# Patient Record
Sex: Male | Born: 1965
Health system: Southern US, Community
[De-identification: ages and names within clinical notes are randomized; demographics above are authoritative.]

## PROBLEM LIST (undated history)

## (undated) DIAGNOSIS — E785 Hyperlipidemia, unspecified: Secondary | ICD-10-CM

## (undated) DIAGNOSIS — M1990 Unspecified osteoarthritis, unspecified site: Secondary | ICD-10-CM

## (undated) HISTORY — DX: Hyperlipidemia, unspecified: E78.5

## (undated) HISTORY — DX: Unspecified osteoarthritis, unspecified site: M19.90

## (undated) HISTORY — PX: TRANSMETATARSAL AMPUTATION: SHX6197

---

## 2003-12-30 ENCOUNTER — Emergency Department (HOSPITAL_COMMUNITY): Admission: EM | Admit: 2003-12-30 | Discharge: 2003-12-30 | Payer: Self-pay | Admitting: Emergency Medicine

## 2004-01-16 ENCOUNTER — Emergency Department (HOSPITAL_COMMUNITY): Admission: EM | Admit: 2004-01-16 | Discharge: 2004-01-16 | Payer: Self-pay | Admitting: *Deleted

## 2004-01-18 ENCOUNTER — Ambulatory Visit: Payer: Self-pay | Admitting: Internal Medicine

## 2004-01-18 ENCOUNTER — Ambulatory Visit (HOSPITAL_COMMUNITY): Admission: RE | Admit: 2004-01-18 | Discharge: 2004-01-18 | Payer: Self-pay | Admitting: Internal Medicine

## 2004-02-16 ENCOUNTER — Ambulatory Visit: Payer: Self-pay | Admitting: Internal Medicine

## 2004-10-16 ENCOUNTER — Ambulatory Visit: Payer: Self-pay | Admitting: Internal Medicine

## 2004-11-16 ENCOUNTER — Ambulatory Visit: Payer: Self-pay | Admitting: Internal Medicine

## 2004-12-29 ENCOUNTER — Emergency Department (HOSPITAL_COMMUNITY): Admission: EM | Admit: 2004-12-29 | Discharge: 2004-12-30 | Payer: Self-pay | Admitting: Emergency Medicine

## 2005-04-10 ENCOUNTER — Emergency Department (HOSPITAL_COMMUNITY): Admission: EM | Admit: 2005-04-10 | Discharge: 2005-04-11 | Payer: Self-pay | Admitting: Certified Registered"

## 2005-04-16 ENCOUNTER — Encounter (INDEPENDENT_AMBULATORY_CARE_PROVIDER_SITE_OTHER): Payer: Self-pay | Admitting: Internal Medicine

## 2005-04-16 ENCOUNTER — Ambulatory Visit: Payer: Self-pay | Admitting: Hospitalist

## 2005-04-26 ENCOUNTER — Ambulatory Visit: Payer: Self-pay | Admitting: Internal Medicine

## 2005-05-07 ENCOUNTER — Ambulatory Visit: Payer: Self-pay | Admitting: Internal Medicine

## 2005-07-12 ENCOUNTER — Ambulatory Visit: Payer: Self-pay | Admitting: Internal Medicine

## 2005-07-31 ENCOUNTER — Emergency Department (HOSPITAL_COMMUNITY): Admission: EM | Admit: 2005-07-31 | Discharge: 2005-07-31 | Payer: Self-pay | Admitting: Emergency Medicine

## 2005-12-06 DIAGNOSIS — E1165 Type 2 diabetes mellitus with hyperglycemia: Secondary | ICD-10-CM | POA: Insufficient documentation

## 2005-12-06 DIAGNOSIS — E1169 Type 2 diabetes mellitus with other specified complication: Secondary | ICD-10-CM

## 2005-12-06 DIAGNOSIS — E785 Hyperlipidemia, unspecified: Secondary | ICD-10-CM

## 2005-12-14 ENCOUNTER — Emergency Department (HOSPITAL_COMMUNITY): Admission: EM | Admit: 2005-12-14 | Discharge: 2005-12-14 | Payer: Self-pay | Admitting: Emergency Medicine

## 2005-12-17 ENCOUNTER — Encounter (INDEPENDENT_AMBULATORY_CARE_PROVIDER_SITE_OTHER): Payer: Self-pay | Admitting: *Deleted

## 2005-12-17 ENCOUNTER — Ambulatory Visit: Payer: Self-pay | Admitting: Hospitalist

## 2005-12-17 LAB — CONVERTED CEMR LAB
CO2: 24 meq/L (ref 19–32)
Creatinine, Ser: 1.02 mg/dL (ref 0.40–1.50)
Glucose, Bld: 217 mg/dL — ABNORMAL HIGH (ref 70–99)
Microalb Creat Ratio: 5.8 mg/g (ref 0.0–30.0)
Microalb, Ur: 0.49 mg/dL (ref 0.00–1.89)
TSH: 0.909 microintl units/mL (ref 0.350–5.50)
Total Bilirubin: 0.3 mg/dL (ref 0.3–1.2)

## 2005-12-31 ENCOUNTER — Encounter (INDEPENDENT_AMBULATORY_CARE_PROVIDER_SITE_OTHER): Payer: Self-pay | Admitting: *Deleted

## 2005-12-31 ENCOUNTER — Ambulatory Visit: Payer: Self-pay | Admitting: Internal Medicine

## 2005-12-31 LAB — CONVERTED CEMR LAB
Cholesterol: 206 mg/dL — ABNORMAL HIGH (ref 0–200)
HDL: 45 mg/dL (ref >39)
Total CHOL/HDL Ratio: 4.6

## 2006-01-08 ENCOUNTER — Ambulatory Visit (HOSPITAL_COMMUNITY): Admission: RE | Admit: 2006-01-08 | Discharge: 2006-01-08 | Payer: Self-pay | Admitting: Internal Medicine

## 2006-01-08 ENCOUNTER — Encounter (INDEPENDENT_AMBULATORY_CARE_PROVIDER_SITE_OTHER): Payer: Self-pay | Admitting: Infectious Diseases

## 2006-01-08 ENCOUNTER — Ambulatory Visit: Payer: Self-pay | Admitting: Internal Medicine

## 2006-01-08 LAB — CONVERTED CEMR LAB
Alkaline Phosphatase: 64 units/L (ref 39–117)
BUN: 14 mg/dL (ref 6–23)
Creatinine, Ser: 1 mg/dL (ref 0.40–1.50)
Glucose, Bld: 102 mg/dL — ABNORMAL HIGH (ref 70–99)
Total Bilirubin: 0.3 mg/dL (ref 0.3–1.2)

## 2006-05-07 ENCOUNTER — Emergency Department (HOSPITAL_COMMUNITY): Admission: EM | Admit: 2006-05-07 | Discharge: 2006-05-07 | Payer: Self-pay | Admitting: Emergency Medicine

## 2006-10-04 ENCOUNTER — Ambulatory Visit: Payer: Self-pay | Admitting: Hospitalist

## 2006-10-04 ENCOUNTER — Encounter (INDEPENDENT_AMBULATORY_CARE_PROVIDER_SITE_OTHER): Payer: Self-pay | Admitting: *Deleted

## 2006-10-04 DIAGNOSIS — L210 Seborrhea capitis: Secondary | ICD-10-CM

## 2006-10-04 LAB — CONVERTED CEMR LAB
Blood Glucose, Fingerstick: 197
Hgb A1c MFr Bld: 10 %

## 2006-10-07 DIAGNOSIS — K644 Residual hemorrhoidal skin tags: Secondary | ICD-10-CM | POA: Insufficient documentation

## 2006-11-14 ENCOUNTER — Encounter (INDEPENDENT_AMBULATORY_CARE_PROVIDER_SITE_OTHER): Payer: Self-pay | Admitting: Infectious Diseases

## 2006-11-14 ENCOUNTER — Ambulatory Visit: Payer: Self-pay | Admitting: Internal Medicine

## 2006-11-14 LAB — CONVERTED CEMR LAB
AST: 16 units/L (ref 0–37)
Alkaline Phosphatase: 77 units/L (ref 39–117)
BUN: 17 mg/dL (ref 6–23)
Calcium: 9.1 mg/dL (ref 8.4–10.5)
Chloride: 103 meq/L (ref 96–112)
Creatinine, Ser: 0.91 mg/dL (ref 0.40–1.50)
Creatinine, Urine: 69.8 mg/dL
Microalb, Ur: 0.2 mg/dL (ref 0.00–1.89)

## 2006-11-19 ENCOUNTER — Telehealth: Payer: Self-pay | Admitting: *Deleted

## 2006-11-21 ENCOUNTER — Telehealth: Payer: Self-pay | Admitting: *Deleted

## 2006-11-27 ENCOUNTER — Emergency Department (HOSPITAL_COMMUNITY): Admission: EM | Admit: 2006-11-27 | Discharge: 2006-11-27 | Payer: Self-pay | Admitting: Emergency Medicine

## 2007-02-06 ENCOUNTER — Encounter (INDEPENDENT_AMBULATORY_CARE_PROVIDER_SITE_OTHER): Payer: Self-pay | Admitting: Internal Medicine

## 2007-02-06 ENCOUNTER — Ambulatory Visit: Payer: Self-pay | Admitting: Internal Medicine

## 2007-02-15 ENCOUNTER — Emergency Department (HOSPITAL_COMMUNITY): Admission: EM | Admit: 2007-02-15 | Discharge: 2007-02-15 | Payer: Self-pay | Admitting: Emergency Medicine

## 2007-03-03 ENCOUNTER — Encounter (INDEPENDENT_AMBULATORY_CARE_PROVIDER_SITE_OTHER): Payer: Self-pay | Admitting: Internal Medicine

## 2007-03-10 ENCOUNTER — Encounter (INDEPENDENT_AMBULATORY_CARE_PROVIDER_SITE_OTHER): Payer: Self-pay | Admitting: Internal Medicine

## 2007-06-16 ENCOUNTER — Emergency Department (HOSPITAL_COMMUNITY): Admission: EM | Admit: 2007-06-16 | Discharge: 2007-06-17 | Payer: Self-pay | Admitting: Emergency Medicine

## 2007-06-17 ENCOUNTER — Encounter: Payer: Self-pay | Admitting: *Deleted

## 2007-06-17 ENCOUNTER — Ambulatory Visit: Payer: Self-pay | Admitting: Internal Medicine

## 2007-06-17 ENCOUNTER — Observation Stay (HOSPITAL_COMMUNITY): Admission: AD | Admit: 2007-06-17 | Discharge: 2007-06-18 | Payer: Self-pay | Admitting: Internal Medicine

## 2007-06-19 ENCOUNTER — Telehealth (INDEPENDENT_AMBULATORY_CARE_PROVIDER_SITE_OTHER): Payer: Self-pay | Admitting: *Deleted

## 2007-06-19 ENCOUNTER — Ambulatory Visit: Payer: Self-pay | Admitting: Internal Medicine

## 2007-07-10 ENCOUNTER — Ambulatory Visit: Payer: Self-pay | Admitting: Internal Medicine

## 2007-07-10 ENCOUNTER — Encounter (INDEPENDENT_AMBULATORY_CARE_PROVIDER_SITE_OTHER): Payer: Self-pay | Admitting: Internal Medicine

## 2007-07-10 LAB — CONVERTED CEMR LAB
Blood Glucose, Fingerstick: 407
Blood Glucose, Home Monitor: 1 mg/dL

## 2007-07-15 LAB — CONVERTED CEMR LAB
AST: 16 units/L (ref 0–37)
Albumin: 4.5 g/dL (ref 3.5–5.2)
Alkaline Phosphatase: 81 units/L (ref 39–117)
BUN: 18 mg/dL (ref 6–23)
Potassium: 4.2 meq/L (ref 3.5–5.3)

## 2007-10-29 ENCOUNTER — Telehealth (INDEPENDENT_AMBULATORY_CARE_PROVIDER_SITE_OTHER): Payer: Self-pay | Admitting: Internal Medicine

## 2008-01-07 ENCOUNTER — Telehealth: Payer: Self-pay | Admitting: *Deleted

## 2008-02-15 ENCOUNTER — Emergency Department (HOSPITAL_COMMUNITY): Admission: EM | Admit: 2008-02-15 | Discharge: 2008-02-15 | Payer: Self-pay | Admitting: Emergency Medicine

## 2008-04-02 ENCOUNTER — Encounter (INDEPENDENT_AMBULATORY_CARE_PROVIDER_SITE_OTHER): Payer: Self-pay | Admitting: Internal Medicine

## 2008-04-02 ENCOUNTER — Encounter (INDEPENDENT_AMBULATORY_CARE_PROVIDER_SITE_OTHER): Payer: Self-pay | Admitting: *Deleted

## 2008-04-02 ENCOUNTER — Ambulatory Visit: Payer: Self-pay | Admitting: Internal Medicine

## 2008-04-02 DIAGNOSIS — K119 Disease of salivary gland, unspecified: Secondary | ICD-10-CM | POA: Insufficient documentation

## 2008-04-05 LAB — CONVERTED CEMR LAB
Albumin: 4 g/dL (ref 3.5–5.2)
BUN: 21 mg/dL (ref 6–23)
Band Neutrophils: 0 % (ref 0–10)
CO2: 25 meq/L (ref 19–32)
Calcium: 9 mg/dL (ref 8.4–10.5)
Chloride: 100 meq/L (ref 96–112)
Creatinine, Ser: 1.21 mg/dL (ref 0.40–1.50)
Eosinophils Absolute: 0.7 10*3/uL (ref 0.0–0.7)
Eosinophils Relative: 9 % — ABNORMAL HIGH (ref 0–5)
HCT: 39.7 % (ref 39.0–52.0)
Hemoglobin: 13.5 g/dL (ref 13.0–17.0)
Lymphs Abs: 2.1 10*3/uL (ref 0.7–4.0)
MCHC: 34 g/dL (ref 30.0–36.0)
MCV: 75.6 fL — ABNORMAL LOW (ref 78.0–100.0)
Monocytes Absolute: 0.5 10*3/uL (ref 0.1–1.0)
Monocytes Relative: 6 % (ref 3–12)
RBC: 5.25 M/uL (ref 4.22–5.81)
TSH: 0.68 microintl units/mL (ref 0.350–4.500)

## 2008-04-20 ENCOUNTER — Ambulatory Visit: Payer: Self-pay | Admitting: Internal Medicine

## 2008-04-20 LAB — CONVERTED CEMR LAB: Blood Glucose, Fingerstick: 334

## 2008-04-21 ENCOUNTER — Ambulatory Visit: Payer: Self-pay | Admitting: *Deleted

## 2008-04-21 ENCOUNTER — Encounter (INDEPENDENT_AMBULATORY_CARE_PROVIDER_SITE_OTHER): Payer: Self-pay | Admitting: Internal Medicine

## 2008-04-21 LAB — CONVERTED CEMR LAB
ALT: 22 units/L (ref 0–53)
Alkaline Phosphatase: 76 units/L (ref 39–117)
Creatinine, Ser: 0.97 mg/dL (ref 0.40–1.50)
Glucose, Bld: 223 mg/dL — ABNORMAL HIGH (ref 70–99)
LDL Cholesterol: 78 mg/dL (ref 0–99)
Sodium: 141 meq/L (ref 135–145)
Total Bilirubin: 0.4 mg/dL (ref 0.3–1.2)
Total CHOL/HDL Ratio: 4.1
Total Protein: 7.6 g/dL (ref 6.0–8.3)
Triglycerides: 146 mg/dL (ref <150)
VLDL: 29 mg/dL (ref 0–40)

## 2008-05-05 ENCOUNTER — Telehealth (INDEPENDENT_AMBULATORY_CARE_PROVIDER_SITE_OTHER): Payer: Self-pay | Admitting: Pharmacy Technician

## 2008-05-07 ENCOUNTER — Encounter (INDEPENDENT_AMBULATORY_CARE_PROVIDER_SITE_OTHER): Payer: Self-pay | Admitting: Internal Medicine

## 2008-05-24 ENCOUNTER — Ambulatory Visit: Payer: Self-pay | Admitting: Internal Medicine

## 2008-05-26 ENCOUNTER — Telehealth (INDEPENDENT_AMBULATORY_CARE_PROVIDER_SITE_OTHER): Payer: Self-pay | Admitting: *Deleted

## 2008-06-03 ENCOUNTER — Encounter (INDEPENDENT_AMBULATORY_CARE_PROVIDER_SITE_OTHER): Payer: Self-pay | Admitting: Internal Medicine

## 2008-06-08 ENCOUNTER — Encounter (INDEPENDENT_AMBULATORY_CARE_PROVIDER_SITE_OTHER): Payer: Self-pay | Admitting: Internal Medicine

## 2008-06-08 ENCOUNTER — Ambulatory Visit: Payer: Self-pay | Admitting: Internal Medicine

## 2008-09-16 ENCOUNTER — Telehealth: Payer: Self-pay | Admitting: Internal Medicine

## 2009-01-14 ENCOUNTER — Encounter: Payer: Self-pay | Admitting: Internal Medicine

## 2009-01-14 ENCOUNTER — Ambulatory Visit: Payer: Self-pay | Admitting: Infectious Diseases

## 2009-01-14 ENCOUNTER — Ambulatory Visit (HOSPITAL_COMMUNITY): Admission: RE | Admit: 2009-01-14 | Discharge: 2009-01-14 | Payer: Self-pay | Admitting: Infectious Diseases

## 2009-01-14 DIAGNOSIS — R079 Chest pain, unspecified: Secondary | ICD-10-CM

## 2009-01-14 LAB — CONVERTED CEMR LAB
Albumin: 3.7 g/dL (ref 3.5–5.2)
Alkaline Phosphatase: 89 units/L (ref 39–117)
BUN: 15 mg/dL (ref 6–23)
CO2: 29 meq/L (ref 19–32)
Calcium: 9.3 mg/dL (ref 8.4–10.5)
Glucose, Bld: 322 mg/dL — ABNORMAL HIGH (ref 70–99)
Hgb A1c MFr Bld: 13.1 %
Potassium: 4.3 meq/L (ref 3.5–5.3)
Sodium: 136 meq/L (ref 135–145)
Total Protein: 7.5 g/dL (ref 6.0–8.3)
Troponin I: 0.01 ng/mL (ref <0.06)

## 2009-01-25 ENCOUNTER — Encounter: Payer: Self-pay | Admitting: Internal Medicine

## 2009-02-01 ENCOUNTER — Ambulatory Visit: Payer: Self-pay | Admitting: Internal Medicine

## 2009-02-01 LAB — CONVERTED CEMR LAB
Blood Glucose, Fingerstick: 167
Hgb A1c MFr Bld: 11.7 %

## 2009-02-02 ENCOUNTER — Encounter (INDEPENDENT_AMBULATORY_CARE_PROVIDER_SITE_OTHER): Payer: Self-pay | Admitting: Internal Medicine

## 2009-02-18 ENCOUNTER — Ambulatory Visit: Payer: Self-pay | Admitting: Internal Medicine

## 2009-03-28 ENCOUNTER — Encounter: Payer: Self-pay | Admitting: Internal Medicine

## 2009-10-22 ENCOUNTER — Emergency Department (HOSPITAL_COMMUNITY): Admission: EM | Admit: 2009-10-22 | Discharge: 2009-10-22 | Payer: Self-pay | Admitting: Emergency Medicine

## 2010-01-02 ENCOUNTER — Emergency Department (HOSPITAL_COMMUNITY)
Admission: EM | Admit: 2010-01-02 | Discharge: 2010-01-03 | Payer: Self-pay | Source: Home / Self Care | Admitting: Emergency Medicine

## 2010-01-02 ENCOUNTER — Emergency Department (HOSPITAL_COMMUNITY)
Admission: EM | Admit: 2010-01-02 | Discharge: 2010-01-02 | Payer: Self-pay | Source: Home / Self Care | Admitting: Family Medicine

## 2010-01-09 ENCOUNTER — Telehealth: Payer: Self-pay | Admitting: Internal Medicine

## 2010-01-09 ENCOUNTER — Emergency Department (HOSPITAL_COMMUNITY)
Admission: EM | Admit: 2010-01-09 | Discharge: 2010-01-09 | Payer: Self-pay | Source: Home / Self Care | Admitting: Emergency Medicine

## 2010-01-18 ENCOUNTER — Ambulatory Visit: Payer: Self-pay

## 2010-02-28 NOTE — Consult Note (Signed)
Summary: [facility] 2010: Diabetic Eye Exam  [facility] 2010: Diabetic Eye Exam   Imported By: Florinda Marker 04/25/2009 11:20:41  _____________________________________________________________________  External Attachment:    Type:   Image     Comment:   External Document  Appended Document: [facility] 2010: Diabetic Eye Exam    Clinical Lists Changes  Observations: Added new observation of DIAB EYE EX: No diabetic retinopathy (04/25/2009 14:57)

## 2010-02-28 NOTE — Letter (Signed)
Summary: Meter DownLoad  Meter DownLoad   Imported By: Florinda Marker 02/02/2009 16:30:19  _____________________________________________________________________  External Attachment:    Type:   Image     Comment:   External Document

## 2010-02-28 NOTE — Assessment & Plan Note (Signed)
Summary: 2WK F/U/SHAH/VS   Vital Signs:  Patient profile:   45 year old male Height:      73 inches Weight:      237.5 pounds BMI:     31.45 Temp:     98.2 degrees F oral Pulse rate:   70 / minute BP sitting:   129 / 83  Vitals Entered By: Filomena Jungling NT II (February 18, 2009 11:05 AM) CC: 2 week follow-up for sugar Is Patient Diabetic? Yes Did you bring your meter with you today? Yes Pain Assessment Patient in pain? no      Nutritional Status BMI of > 30 = obese CBG Result 147  Have you ever been in a relationship where you felt threatened, hurt or afraid?No   Does patient need assistance? Functional Status Self care Ambulation Normal   Primary Care Provider:  Clerance Lav MD  CC:  2 week follow-up for sugar.  History of Present Illness: This is a 45 year old man with PMH of DM, HLD who came here for recheck his CBG after last visit about  2 week ago.  He can speak and understand limited [place] and interpreter on site. During last vist his lantus was increased from 20 to 30 unit, but he continues to take 20 units and his CBG still runs 129-400.  He has no other complaints, no chest pain or SOB, diarrhea or dysuria.   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 Take 2 Tablets By Mouth Two Times A Day 2)  Pravachol 40 Mg  Tabs (Pravastatin Sodium) .... Take 1 Tablet By Mouth Once A Day 3)  Insulin Syringe 31g X 5/16" 0.3 Ml  Misc (Insulin Syringe-Needle U-100) ....  Use To Inject 25 Units Lantus Insulin Every Evening 4)  Lantus 100 Unit/ml  Soln (Insulin Glargine) .... Iinject 30 Units Into The Skin of Your Abdomen Every Evening 5)  Metformin Hcl 1000 Mg Tabs (Metformin Hcl) .... Take 1 Tablet By Mouth Two Times A Day 6)  Famotidine 20 Mg Tabs (Famotidine) .... Take 1 Tablet By Mouth Once A Day As Needed Heartburn 7)  Prodigy Autocode Blood Glucose  Strp (Glucose Blood) .... Use To Test Blood Sugar 3x Daily 8)  Prodigy Insulin Syringe 31g X 5/16" 0.3 Ml Misc (Insulin Syringe-Needle U-100) .... Use To Inject Insulin Once Daily in The Evening 9)  Prodigy Twist Top Lancets 28g  Misc (Lancets) .... Use To Check Blood Sugar 3x Daily 10)  Ketoconazole 2 % Sham (Ketoconazole) .... Use Daily When Washing Hairs  Current Medications (verified): 1)  Glipizide 10 Mg Tabs (Glipizide) .... Take 2 Tablets By Mouth Two Times A Day 2)  Pravachol 40 Mg  Tabs (Pravastatin Sodium) .... Take 1 Tablet By Mouth Once A Day 3)  Insulin Syringe 31g X 5/16" 0.3 Ml  Misc (Insulin Syringe-Needle U-100) .... Use To Inject 25 Units Lantus Insulin Every Evening 4)  Lantus 100 Unit/ml  Soln (Insulin Glargine) .... Iinject 30  Units Into The Skin of Your Abdomen Every Evening 5)  Metformin Hcl 1000 Mg Tabs (Metformin Hcl) .... Take 1 Tablet By Mouth Two Times A Day 6)  Famotidine 20 Mg Tabs (Famotidine) .... Take 1 Tablet By Mouth Once A Day As Needed Heartburn 7)  Prodigy Autocode Blood Glucose  Strp (Glucose Blood) .... Use To Test Blood Sugar 3x Daily 8)  Prodigy Insulin Syringe 31g X 5/16" 0.3 Ml Misc (Insulin Syringe-Needle U-100) .... Use To Inject Insulin Once Daily in The Evening 9)  Prodigy Twist Top Lancets 28g  Misc (Lancets) .... Use To Check Blood Sugar 3x Daily 10)  Ketoconazole 2 % Sham (Ketoconazole) .... Use Daily When Washing Hairs  Allergies (verified): No Known Drug Allergies  Past History:  Past Medical History: Last updated: 12/06/2005 Diabetes mellitus, type  II Hyperlipidemia Left knee pain and right shoulder pain. Left knee plain film showed arthritic changes.  No changes seen in the elbow film. 12/07  Past Surgical History: Last updated: 04/02/2008 Immigrated from [place] and speaks arabic.  Social History: Reviewed history from 02/01/2009 and no changes required. Married and happy [place]ese  Review of Systems  The patient denies anorexia, fever, vision loss, chest pain, dyspnea on exertion, peripheral edema, prolonged cough, headaches, abdominal pain, and melena.    Physical Exam  General:  alert, well-developed, well-nourished, well-hydrated, and overweight-appearing.   Head:  normocephalic.   Eyes:  vision grossly intact.   Ears:  no external deformities.   Nose:  no external erythema.   Mouth:  pharynx pink and moist.   Neck:  supple.   Lungs:  normal respiratory effort, normal breath sounds, no crackles, and no wheezes.   Heart:  normal rate, regular rhythm, no murmur, and no JVD.   Abdomen:  soft, non-tender, normal bowel sounds, no distention, and no masses.   Msk:  normal ROM, no joint tenderness, no joint swelling, no joint warmth, and no redness over joints.   Pulses:  2+ Extremities:  No edema. Neurologic:  alert & oriented X3, cranial nerves II-XII intact, strength normal in all extremities, sensation intact to light touch, and gait normal.     Impression & Recommendations:  Problem # 1:  DIABETES MELLITUS, TYPE II (ICD-250.00) Assessment Unchanged His CBg still poorly controlled, most of his CBG runs about 300, has continued lantus 20 units instead of 30 units. Has told him to take 30 units and bring his glucometer with him next time so that we can adjust his insulin dose. He verbally understands via his interpreter. Also advised weight loss, exercise.  His updated medication list for this problem includes:    Glipizide 10 Mg Tabs (Glipizide) .Marland Kitchen... Take 2 tablets by mouth two times a day    Lantus 100 Unit/ml Soln  (Insulin glargine) ..... Iinject 30 units into the skin of your abdomen every evening    Metformin Hcl 1000 Mg Tabs (Metformin hcl) .Marland Kitchen... Take 1 tablet by mouth two times a day  Labs Reviewed: Creat: 1.02 (01/14/2009)     Last Eye Exam: No retinopathy (03/27/2007) Reviewed HgBA1c results: 11.7 (02/01/2009)  13.1 (01/14/2009)  Problem # 2:  HYPERLIPIDEMIA (ICD-272.4) Assessment: Unchanged At target goal. Continue pravastatin.   His updated medication list for this problem includes:    Pravachol 40 Mg Tabs (Pravastatin sodium) .Marland Kitchen... Take 1 tablet by mouth once a day  Labs Reviewed: SGOT: 18 (01/14/2009)   SGPT: 23 (01/14/2009)   HDL:35 (04/21/2008), 45 (12/31/2005)  LDL:78 (04/21/2008), 138 (16/10/9602)  Chol:142 (  04/21/2008), 206 (12/31/2005)  Trig:146 (04/21/2008), 113 (12/31/2005)  Complete Medication List: 1)  Glipizide 10 Mg Tabs (Glipizide) .... Take 2 tablets by mouth two times a day 2)  Pravachol 40 Mg Tabs (Pravastatin sodium) .... Take 1 tablet by mouth once a day 3)  Insulin Syringe 31g X 5/16" 0.3 Ml Misc (Insulin syringe-needle u-100) .... Use to inject 25 units lantus insulin every evening 4)  Lantus 100 Unit/ml Soln (Insulin glargine) .... Iinject 30 units into the skin of your abdomen every evening 5)  Metformin Hcl 1000 Mg Tabs (Metformin hcl) .... Take 1 tablet by mouth two times a day 6)  Famotidine 20 Mg Tabs (Famotidine) .... Take 1 tablet by mouth once a day as needed heartburn 7)  Prodigy Autocode Blood Glucose Strp (Glucose blood) .... Use to test blood sugar 3x daily 8)  Prodigy Insulin Syringe 31g X 5/16" 0.3 Ml Misc (Insulin syringe-needle u-100) .... Use to inject insulin once daily in the evening 9)  Prodigy Twist Top Lancets 28g Misc (Lancets) .... Use to check blood sugar 3x daily 10)  Ketoconazole 2 % Sham (Ketoconazole) .... Use daily when washing hairs  Patient Instructions: 1)  Please schedule a follow-up appointment in 2-3 weeks. 2)  Please  use  lantus 30 units and check your blood sugar 3 times a day and bring your glucometer with you at next visit. Prescriptions: LANTUS 100 UNIT/ML  SOLN (INSULIN GLARGINE) Iinject 30 units into the skin of your abdomen every evening  #1 x 5   Entered and Authorized by:   Jackson Latino MD   Signed by:   Jackson Latino MD on 02/18/2009   Method used:   Electronically to        [facility]  [address] # [id number]* (retail)       [address]       [place], [place]  [address]       Ph: [phone] or [phone]       Fax: [phone]   RxID:   [id number] GLIPIZIDE 10 MG TABS (GLIPIZIDE) Take 2 tablets by mouth two times a day  #120 x 4   Entered and Authorized by:   Jackson Latino MD   Signed by:   Jackson Latino MD on 02/18/2009   Method used:   Electronically to        [facility]  [address] [id number]* (retail)       [address]       [place], [place]  [address]       Ph: [phone] or [phone]       Fax: [phone]   RxID:   [id number]   Prevention & Chronic Care Immunizations   Influenza vaccine: Not documented   Influenza vaccine deferral: Refused  (02/18/2009)    Tetanus booster: Not documented    Pneumococcal vaccine: Not documented  Other Screening   Smoking status: never  (02/18/2009)  Diabetes Mellitus   HgbA1C: 11.7  (02/01/2009)    Eye exam: No retinopathy  (03/27/2007)    Foot exam: Not documented   High risk foot: Not documented   Foot care education: Not documented    Urine microalbumin/creatinine ratio: 2.9  (11/14/2006)    Diabetes flowsheet reviewed?: Yes   Progress toward A1C goal: Improved  Lipids   Total Cholesterol: 142  (04/21/2008)   LDL: 78  (04/21/2008)   LDL Direct: Not documented  HDL: 35  (04/21/2008)   Triglycerides: 146  (04/21/2008)    SGOT (AST): 18  (01/14/2009)   SGPT (ALT): 23  (01/14/2009)   Alkaline phosphatase: 89  (01/14/2009)   Total bilirubin: 0.6  (01/14/2009)     Lipid flowsheet reviewed?: Yes   Progress toward LDL goal: At goal  Self-Management Support :   Personal Goals (by the next clinic visit) :     Personal A1C goal: 7  (02/18/2009)     Personal blood pressure goal: 130/80  (02/18/2009)     Personal LDL goal: 100  (02/18/2009)    Patient will work on the following items until the next clinic visit to reach self-care goals:     Medications and monitoring: take my medicines every day, check my blood sugar, bring all of my medications to every visit, weigh myself weekly, examine my feet every day  (02/18/2009)     Eating: drink diet soda or water instead of juice or soda, eat foods that are low in salt, eat baked foods instead of fried foods, limit or avoid alcohol  (02/18/2009)     Other: plays soccer  (02/18/2009)    Diabetes self-management support: Copy of home glucose meter record, Written self-care plan  (02/18/2009)   Diabetes care plan printed   Last diabetes self-management training by diabetes educator: 06/08/2008   Last medical nutrition therapy: 04/20/2008    Lipid self-management support: Written self-care plan  (02/18/2009)   Lipid self-care plan printed.

## 2010-02-28 NOTE — Consult Note (Signed)
Summary: [facility]   Imported By: Florinda Marker 02/15/2009 14:20:59  _____________________________________________________________________  External Attachment:    Type:   Image     Comment:   External Document

## 2010-02-28 NOTE — Assessment & Plan Note (Signed)
Summary: ACUTE/SHAH/2 WEEK RECHECK PER MILLS FOR BP AND SKIN RASH/CH   Vital Signs:  Patient profile:   45 year old male Height:      73 inches (185.42 cm) Weight:      233.8 pounds (106.27 kg) BMI:     30.96 Temp:     98.3 degrees F (36.83 degrees C) oral Pulse rate:   74 / minute BP sitting:   114 / 67  (right arm)  Vitals Entered By: Stanton Kidney Ditzler RN (February 01, 2009 4:44 PM) Is Patient Diabetic? Yes Did you bring your meter with you today? Yes Pain Assessment Patient in pain? no      Nutritional Status BMI of 25 - 29 = overweight Nutritional Status Detail appetite good CBG Result 167  Have you ever been in a relationship where you felt threatened, hurt or afraid?denies   Does patient need assistance? Functional Status Self care Ambulation Normal Comments FU - doing better.   Primary Care Provider:  Valetta Close MD   History of Present Illness: This is a 45 year old man with past medical history of DM, HLD.  He is here for 2 week appt to discuss DM managment.  His last A1C 2 weeks ago was 13.1.  At that time he was instructed to start checking his cbg's three times a day.  I can not tell if any medication changes were made.    He reports that he has been feeling well.  He has no current complaints.  He was able to see Dr. Sharyn Lull and reports that all of his tests there were negative.  He brings a script from Dr. Sharyn Lull for a stress myoview.  He is here with an interpreter, which I think is very important as he does not understand much english... even though he tries hard to converse in english.    Depression History:      The patient denies a depressed mood most of the day and a diminished interest in his usual daily activities.         Preventive Screening-Counseling & Management  Alcohol-Tobacco     Smoking Status: never  Caffeine-Diet-Exercise     Does Patient Exercise: yes     Type of exercise: SOCCER     Times/week: 2  Medications Prior to  Update: 1)  Glipizide 10 Mg Tabs (Glipizide) .... Take 2 Tablets By Mouth Two Times A Day 2)  Pravachol 40 Mg  Tabs (Pravastatin Sodium) .... Take 1 Tablet By Mouth Once A Day 3)  Insulin Syringe 31g X 5/16" 0.3 Ml  Misc (Insulin Syringe-Needle U-100) .... Use To Inject 25 Units Lantus Insulin Every Evening 4)  Lantus 100 Unit/ml  Soln (Insulin Glargine) .... Iinject 30 Units Into The Skin of Your Abdomen Every Evening 5)  Metformin Hcl 1000 Mg Tabs (Metformin Hcl) .... Take 1 Tablet By Mouth Two Times A Day 6)  Famotidine 20 Mg Tabs (Famotidine) .... Take 1 Tablet By Mouth Once A Day As Needed Heartburn 7)  Prodigy Autocode Blood Glucose  Strp (Glucose Blood) .... Use To Test Blood Sugar 3x Daily 8)  Prodigy Insulin Syringe 31g X 5/16" 0.3 Ml Misc (Insulin Syringe-Needle U-100) .... Use To Inject Insulin Once Daily in The Evening 9)  Prodigy Twist Top Lancets 28g  Misc (Lancets) .... Use To Check Blood Sugar 3x Daily 10)  Ketoconazole 2 % Sham (Ketoconazole) .... Use Daily When Washing Hair  Allergies: No Known Drug Allergies  Social History: Married and  happy [place]ese  Review of Systems       per hpi  Physical Exam  General:  alert.     Impression & Recommendations:  Problem # 1:  DIABETES MELLITUS, TYPE II (ICD-250.00) cbg's still 300 and 400's.  he is on ly taking 20 u of lantus and is not taking metformin. We discussed his medications in detail with the interpreter. I do not think that he should be on BOTH glypizide and lantus, but given his A1C and meter readings I do not think that he will become hypoglycemic on these at this time.  I also felt it would be too confusing to tell him not to take one of the medications that he has been taking. He seemed to understand the medications by the time we were through. He will rtc in 2 weeks with his meter to discuss further A urine micro/alb to cr should be checked at that time.  His updated medication list for this problem  includes:    Glipizide 10 Mg Tabs (Glipizide) .Marland Kitchen... Take 2 tablets by mouth two times a day    Lantus 100 Unit/ml Soln (Insulin glargine) ..... Iinject 30 units into the skin of your abdomen every evening    Metformin Hcl 1000 Mg Tabs (Metformin hcl) .Marland Kitchen... Take 1 tablet by mouth two times a day  Orders: T- Capillary Blood Glucose ([address]) T-Hgb A1C (in-house) ([id number]) T-Urine Microalbumin w/creat. ratio [phone])  Problem # 2:  HYPERLIPIDEMIA (ICD-272.4) Last lipids in 3/10 OK.  Recheck in 3/11.  His updated medication list for this problem includes:    Pravachol 40 Mg Tabs (Pravastatin sodium) .Marland Kitchen... Take 1 tablet by mouth once a day  Labs Reviewed: SGOT: 18 (01/14/2009)   SGPT: 23 (01/14/2009)   HDL:35 (04/21/2008), 45 (12/31/2005)  LDL:78 (04/21/2008), 138 (30/86/5784)  Chol:142 (04/21/2008), 206 (12/31/2005)  Trig:146 (04/21/2008), 113 (12/31/2005)  Problem # 3:  CHEST PAIN, INTERMITTENT (ICD-786.50) Reports that Dr Annitta Jersey tests were all negative.  Brings a script from Dr. Sharyn Lull for stress myoview. I will try to get notes from Dr. Annitta Jersey office visit.  Complete Medication List: 1)  Glipizide 10 Mg Tabs (Glipizide) .... Take 2 tablets by mouth two times a day 2)  Pravachol 40 Mg Tabs (Pravastatin sodium) .... Take 1 tablet by mouth once a day 3)  Insulin Syringe 31g X 5/16" 0.3 Ml Misc (Insulin syringe-needle u-100) .... Use to inject 25 units lantus insulin every evening 4)  Lantus 100 Unit/ml Soln (Insulin glargine) .... Iinject 30 units into the skin of your abdomen every evening 5)  Metformin Hcl 1000 Mg Tabs (Metformin hcl) .... Take 1 tablet by mouth two times a day 6)  Famotidine 20 Mg Tabs (Famotidine) .... Take 1 tablet by mouth once a day as needed heartburn 7)  Prodigy Autocode Blood Glucose Strp (Glucose blood) .... Use to test blood sugar 3x daily 8)  Prodigy Insulin Syringe 31g X 5/16" 0.3 Ml Misc (Insulin syringe-needle u-100) .... Use to inject  insulin once daily in the evening 9)  Prodigy Twist Top Lancets 28g Misc (Lancets) .... Use to check blood sugar 3x daily 10)  Ketoconazole 2 % Sham (Ketoconazole) .... Use daily when washing hairs  Patient Instructions: 1)  Please schedule a follow-up appointment in 2 weeks. 2)  Please take medications as they are written on this sheet. 3)  You have a prescription for metformin waiting for you at the pharmacy. 4)  Check your blood sugars two times a day and bring  your meter to the next appointment. Prescriptions: METFORMIN HCL 1000 MG TABS (METFORMIN HCL) Take 1 tablet by mouth two times a day  #60 x 3   Entered and Authorized by:   Elby Showers MD   Signed by:   Elby Showers MD on 02/01/2009   Method used:   Electronically to        [facility]  [address] [id number]* (retail)       [address]       [place], [place]  [address]       Ph: [phone] or [phone]       Fax: [phone]   RxID:   [id number]  Process Orders Check Orders Results:     [facility]: ABN not required for this insurance Tests Sent for requisitioning (February 01, 2009 7:08 PM):     02/01/2009: [facility] -- T-Urine Microalbumin w/creat. ratio [[id number]] (signed)    Prevention & Chronic Care Immunizations   Influenza vaccine: Not documented    Tetanus booster: Not documented    Pneumococcal vaccine: Not documented  Other Screening   Smoking status: never  (02/01/2009)  Diabetes Mellitus   HgbA1C: 11.7  (02/01/2009)    Eye exam: No retinopathy  (03/27/2007)    Foot exam: Not documented   High risk foot: Not documented   Foot care education: Not documented    Urine microalbumin/creatinine ratio: 2.9  (11/14/2006)  Lipids   Total Cholesterol: 142  (04/21/2008)   LDL: 78  (04/21/2008)   LDL Direct: Not documented   HDL: 35  (04/21/2008)   Triglycerides: 146  (04/21/2008)    SGOT (AST): 18  (01/14/2009)   SGPT  (ALT): 23  (01/14/2009)   Alkaline phosphatase: 89  (01/14/2009)   Total bilirubin: 0.6  (01/14/2009)  Self-Management Support :    Patient will work on the following items until the next clinic visit to reach self-care goals:     Medications and monitoring: take my medicines every day, check my blood sugar, check my blood pressure, bring all of my medications to every visit, weigh myself weekly, examine my feet every day  (02/01/2009)     Eating: drink diet soda or water instead of juice or soda, eat foods that are low in salt, eat fruit for snacks and desserts, limit or avoid alcohol  (02/01/2009)    Diabetes self-management support: Copy of home glucose meter record  (02/01/2009)   Last diabetes self-management training by diabetes educator: 06/08/2008   Last medical nutrition therapy: 04/20/2008    Lipid self-management support: Not documented   Laboratory Results   Blood Tests   Date/Time Received: February 01, 2009 5:10 PM Date/Time Reported: Alric Quan  February 01, 2009 5:10 PM  HGBA1C: 11.7%   (Normal Range: Non-Diabetic - 3-6%   Control Diabetic - 6-8%) CBG Random:: 167mg /dL

## 2010-03-02 NOTE — Progress Notes (Signed)
Summary: Refill/gh  Phone Note Refill Request Message from:  Patient on January 09, 2010 1:38 PM  Refills Requested: Medication #1:  GLIPIZIDE 10 MG TABS Take 2 tablets by mouth two times a day  Medication #2:  METFORMIN HCL 1000 MG TABS Take 1 tablet by mouth two times a day  Method Requested: Fax to Local Pharmacy Initial call taken by: Angelina Ok RN,  January 09, 2010 1:38 PM  Follow-up for Phone Call        pt to be seen by Dr. Baltazar Apo on 21st. Refill after that. Pt not seen in last year.  Follow-up by: Clerance Lav MD,  January 16, 2010 8:14 PM

## 2010-03-30 ENCOUNTER — Other Ambulatory Visit: Payer: Self-pay | Admitting: *Deleted

## 2010-03-30 MED ORDER — METFORMIN HCL 1000 MG PO TABS: 1000.0000 mg | ORAL_TABLET | Freq: Two times a day (BID) | ORAL | Status: DC

## 2010-03-30 MED ORDER — GLIPIZIDE 10 MG PO TABS: ORAL_TABLET | ORAL | Status: DC

## 2010-03-30 MED ORDER — INSULIN GLARGINE 100 UNIT/ML ~~LOC~~ SOLN: SUBCUTANEOUS | Status: DC

## 2010-04-16 LAB — GLUCOSE, CAPILLARY: Glucose-Capillary: 147 mg/dL — ABNORMAL HIGH (ref 70–99)

## 2010-05-14 ENCOUNTER — Encounter: Payer: Self-pay | Admitting: Internal Medicine

## 2010-05-15 LAB — POCT I-STAT, CHEM 8
BUN: 17 mg/dL (ref 6–23)
Chloride: 103 mEq/L (ref 96–112)
Creatinine, Ser: 1.1 mg/dL (ref 0.4–1.5)
Sodium: 138 mEq/L (ref 135–145)

## 2010-05-15 LAB — GLUCOSE, CAPILLARY

## 2010-06-16 NOTE — Discharge Summary (Signed)
[name], [name]                  ACCOUNT NO.:  [id number]   MEDICAL RECORD NO.:  [id number]          PATIENT TYPE:  INP   LOCATION:  3705                         FACILITY:  [facility]   PHYSICIAN:  Eliseo Gum, M.D.   DATE OF BIRTH:  17-Oct-1965   DATE OF ADMISSION:  06/17/2007  DATE OF DISCHARGE:  06/18/2007                               DISCHARGE SUMMARY   DISCHARGE DIAGNOSES:  1. Diabetes mellitus type 2.  2. Hypertriglyceridemia.  3. Seborrheic capitis.   DISCHARGE MEDICATIONS:  1. Glipizide 20 mg twice a day.  2. Lantus 20 units subcu once daily.  3. Pravastatin 20 mg once daily.  4. Minocin 100 mg twice daily.  5. Retin-A cream 0.1% at nightly.   DISPOSITION AND FOLLOWUP:  The patient was discharged in stable  condition to be followed up in the [facility].  He was  to see Dr. Polly Cobia on July 10, 2007 at 2:30 p.m.  He had an appointment  with Jamison Neighbor, Diabetes Educator on Jun 19, 2007 at 1:30 p.m. to  discuss his new of diabetes medication regime.   PROCEDURES:  CT of head on Jun 17, 2007 shows no acute intracranial  findings.   HISTORY AND PHYSICAL:  [name] is a 45 year old male with history of  diabetes, hyperlipidemia, and seborrheic capitis presenting with a  severe headache on morning of admission.  He was in the emergency  department the night previous to admission with a CBG of greater than  400.  He checks his sugars once or twice daily.  Generally in the  morning sometime after dinner and sometimes before bed.  Sometimes, his  blood sugar level is less than 200.  It is never below 100.  Mr. Rhines  states that his medications have not changed over the last month.  He  last saw his dermatologist greater than 3 months ago.  His headache  started the day previous to admission.  He did not take anything for his  symptoms, but he noted that his blood sugar level was greater than 400.  He describes the pain to be greatest on his left temple.  It  is a sharp  pain that he has never had before.  Pain had been 9/10 at morning of  admission and at the time of admission, he had no pain.  He has had no  change in vision or hearing.  He has had no pain in his neck and no  symptoms of confusion.  He has had no sick contacts.  He has no other  symptoms.   PHYSICAL EXAMINATION:  GENERAL:  Benign.  HEENT:  Pupils equal, round, and reactive.  NEUROLOGIC:  Nonfocal.  VITAL SIGNS:  Temperature 98.4, blood pressure 111/69, pulse 60, and  respirations 18.  The patient is sating 97% on room air.   ADMISSION LABS:  Sodium 136, potassium 4.0, chloride 104, bicarb 26, BUN  12, creatinine 0.86, glucose 394, white blood cells 5.5, hemoglobin 13,  hematocrit 38, platelets 184, and MCV is 80.  UA showed greater than  1000 glucose  otherwise negative.  CT of head negative.   HOSPITAL COURSE BY PROBLEM:  1. Hyperglycemia.  This is a patient with a history of diabetes type      2.  Last A1c in  January 2009 was 12.1.  He has a history of      noncompliance with his oral medications and has refused to start      injectable insulin as until this time.  During his hospitalization,      the importance of diabetes control was discussed and he agreed to      initiate insulin therapy.  He was started on Lantus 20 units      subcutaneously every nightly and an appointment was made with Jamison Neighbor, Diabetes Educator day after discharge to discuss proper use      of this medication.  A1c checked during this hospitalization on Jun 18, 2007 was 11.8.  2. Headache.  CT negative.  Headache resolved during admission.  Most      likely, headache was secondary to hyperglycemia.  Cardiac enzymes      were checked as headache may have reporesented ab atypical      presentation of ACS in a high risk patient.  Enzymes were negative,      and EKG showed no changes suggestive of ACS.  TSH was checked and      was 1.063 which is within normal limits.  3. High  triglycerides:  Lipid panel checked in hospital shows LDL of      44, HDL of 25, total cholesterol of 146.  Triglycerides were high      at 387; this was a fasting lab drawn at 5:00 a.m.  This should be      followed in the outpatient setting.   DISCHARGE LABS AND VITALS:  On day of discharge, temperature 97.5, blood  pressure 107/79, heart rate 71, and O2 100% on room air.  Sodium 138,  potassium 3.9, chloride 107, bicarb 26, BUN 13, creatinine 0.93, glucose  233, white blood cells 5.9, hemoglobin 12.6, hematocrit 36.5, and  platelets 180.      Elby Showers, MD  Electronically Signed      Eliseo Gum, M.D.  Electronically Signed    CW/MEDQ  D:  07/14/2007  T:  07/15/2007  Job:  [id number]

## 2010-06-27 ENCOUNTER — Other Ambulatory Visit: Payer: Self-pay | Admitting: *Deleted

## 2010-06-27 NOTE — Telephone Encounter (Signed)
Pt has appt scheduled, was informed he must keep appt for more refills

## 2010-06-28 MED ORDER — GLIPIZIDE 10 MG PO TABS: ORAL_TABLET | ORAL | Status: DC

## 2010-06-28 MED ORDER — METFORMIN HCL 1000 MG PO TABS: 1000.0000 mg | ORAL_TABLET | Freq: Two times a day (BID) | ORAL | Status: DC

## 2010-06-28 MED ORDER — INSULIN GLARGINE 100 UNIT/ML ~~LOC~~ SOLN: SUBCUTANEOUS | Status: DC

## 2010-07-05 ENCOUNTER — Encounter: Payer: Self-pay | Admitting: Internal Medicine

## 2010-07-05 ENCOUNTER — Ambulatory Visit (INDEPENDENT_AMBULATORY_CARE_PROVIDER_SITE_OTHER): Payer: Self-pay | Admitting: Internal Medicine

## 2010-07-05 DIAGNOSIS — E119 Type 2 diabetes mellitus without complications: Secondary | ICD-10-CM

## 2010-07-05 DIAGNOSIS — E785 Hyperlipidemia, unspecified: Secondary | ICD-10-CM

## 2010-07-05 LAB — POCT GLYCOSYLATED HEMOGLOBIN (HGB A1C): Hemoglobin A1C: >14

## 2010-07-05 LAB — BASIC METABOLIC PANEL
CO2: 21 mEq/L (ref 19–32)
Calcium: 9.3 mg/dL (ref 8.4–10.5)
Chloride: 101 mEq/L (ref 96–112)
Creat: 0.79 mg/dL (ref 0.50–1.35)
Glucose, Bld: 364 mg/dL — ABNORMAL HIGH (ref 70–99)

## 2010-07-05 LAB — CBC
HCT: 39.6 % (ref 39.0–52.0)
Hemoglobin: 14.1 g/dL (ref 13.0–17.0)
MCH: 27.3 pg (ref 26.0–34.0)
MCV: 76.6 fL — ABNORMAL LOW (ref 78.0–100.0)
RBC: 5.17 MIL/uL (ref 4.22–5.81)
WBC: 7.3 10*3/uL (ref 4.0–10.5)

## 2010-07-05 LAB — GLUCOSE, CAPILLARY: Glucose-Capillary: 406 mg/dL — ABNORMAL HIGH (ref 70–99)

## 2010-07-05 MED ORDER — INSULIN ASPART PROT & ASPART (70-30 MIX) 100 UNIT/ML ~~LOC~~ SUSP: 25.0000 [IU] | Freq: Two times a day (BID) | SUBCUTANEOUS | Status: DC

## 2010-07-05 MED ORDER — "INSULIN SYRINGE 31G X 5/16"" 0.5 ML MISC": 1.0000 | Freq: Two times a day (BID) | Status: DC

## 2010-07-05 MED ORDER — GLUCOSE BLOOD VI STRP: ORAL_STRIP | Status: DC

## 2010-07-05 NOTE — Patient Instructions (Signed)
Return in 2 weeks

## 2010-07-05 NOTE — Progress Notes (Signed)
  Subjective:    Patient ID: [name], male    DOB: 1965/11/26, 45 y.o.   MRN: [id number]  HPI 45 years old male here with his wife and translator, after a year hiatus in his care. He had not been taking any of his medicines and stopped coming to clinic because he lost job and did not have ability to pay. His wife speaks decent [place] and reports sugars over last one month, majority of which vary from 150-250. He used to be on lantus 30 units but they were asked to pay 140 dollars for insulin which they could not afford. He has no other complain.    Review of Systems  Constitutional: Negative for fever, chills, activity change and appetite change.  HENT: Negative for nosebleeds, facial swelling, neck pain and tinnitus.   Eyes: Positive for visual disturbance. Negative for pain and discharge.  Respiratory: Negative for cough, chest tightness and shortness of breath.   Cardiovascular: Negative for chest pain and palpitations.  Gastrointestinal: Negative for nausea, vomiting, abdominal pain, blood in stool and abdominal distention.  Skin: Negative for rash.  Neurological: Negative for dizziness, seizures, weakness and headaches.  Psychiatric/Behavioral: Negative for suicidal ideas, confusion and agitation.       Objective:   Physical Exam  Constitutional: He is oriented to person, place, and time. He appears well-developed and well-nourished.  HENT:  Head: Normocephalic and atraumatic.  Right Ear: External ear normal.  Left Ear: External ear normal.  Eyes: Conjunctivae and EOM are normal. Pupils are equal, round, and reactive to light. Right eye exhibits no discharge. Left eye exhibits no discharge.  Neck: Normal range of motion. Neck supple. No thyromegaly present.  Cardiovascular: Normal rate and regular rhythm.   No murmur heard. Pulmonary/Chest: Effort normal and breath sounds normal. No respiratory distress. He has no wheezes. He has no rales.  Abdominal: Soft. Bowel sounds are  normal. He exhibits no distension and no mass. There is no tenderness. There is no rebound and no guarding.  Musculoskeletal: Normal range of motion.  Neurological: He is alert and oriented to person, place, and time. He has normal reflexes. No cranial nerve deficit. Coordination normal.  Skin: No rash noted. He is not diaphoretic. No erythema.  Psychiatric: He has a normal mood and affect. His behavior is normal. Judgment and thought content normal.          Assessment & Plan:

## 2010-07-05 NOTE — Assessment & Plan Note (Signed)
It is not clear whether he is taking pravastatin. He also needs repeat FLP. We will have to address this on next visit because he seems overwhelmed with new information on 70/30 insulin and its cost.

## 2010-07-05 NOTE — Assessment & Plan Note (Addendum)
Cant afford lantus, taking oral agents only. I will start him on 70/30. He will apply for orange card. I will start him at 25 units twice daily. They will continue to monitor his sugars. Adjust dose on following visits.

## 2010-07-12 ENCOUNTER — Other Ambulatory Visit: Payer: Self-pay | Admitting: Internal Medicine

## 2010-07-12 DIAGNOSIS — E119 Type 2 diabetes mellitus without complications: Secondary | ICD-10-CM

## 2010-07-12 MED ORDER — GLUCOSE BLOOD VI STRP: ORAL_STRIP | Status: DC

## 2010-07-19 ENCOUNTER — Other Ambulatory Visit: Payer: Self-pay | Admitting: Internal Medicine

## 2010-07-19 ENCOUNTER — Ambulatory Visit (INDEPENDENT_AMBULATORY_CARE_PROVIDER_SITE_OTHER): Payer: Self-pay | Admitting: Internal Medicine

## 2010-07-19 VITALS — BP 112/74 | HR 77 | Temp 97.0°F | Ht 75.0 in | Wt 215.7 lb

## 2010-07-19 DIAGNOSIS — E119 Type 2 diabetes mellitus without complications: Secondary | ICD-10-CM

## 2010-07-19 NOTE — Progress Notes (Signed)
  Subjective:    Patient ID: Jerrit Horen, male    DOB: 01/18/1966, 45 y.o.   MRN: [id number]  Allergic Reaction Pertinent negatives include no abdominal pain, chest pain, coughing, rash or vomiting.   45 years old male was here with his wife and translator a week ago, after a year hiatus in his care. He had not been taking any of his medicines and stopped coming to clinic because he lost job and did not have ability to pay. His wife speaks decent [place] and reports sugars over last one month, majority of which vary from 150-250. He used to be on lantus 30 units but they were asked to pay 140 dollars for insulin which they could not afford. He had no other complain.   I had then started him on 70/30 insulin, which he took until the sample ran out, and then stopped. His sugar readings are still in 300-400s in AM before breakfast. He tells me that he can not buy insulin due to money issues. I told him that he will definitely need to be on it for controlling his sugars. He informs me that he will have money by next month. He is still doing paperwork with Jaynee Eagles for access to orange card.    Review of Systems  Constitutional: Negative for fever, chills, activity change and appetite change.  HENT: Negative for nosebleeds, facial swelling, neck pain and tinnitus.   Eyes: Positive for visual disturbance. Negative for pain and discharge.  Respiratory: Negative for cough, chest tightness and shortness of breath.   Cardiovascular: Negative for chest pain and palpitations.  Gastrointestinal: Negative for nausea, vomiting, abdominal pain, blood in stool and abdominal distention.  Skin: Negative for rash.  Neurological: Negative for dizziness, seizures, weakness and headaches.  Psychiatric/Behavioral: Negative for suicidal ideas, confusion and agitation.       Objective:   Physical Exam  Constitutional: He is oriented to person, place, and time. He appears well-developed and well-nourished.  HENT:    Head: Normocephalic and atraumatic.  Right Ear: External ear normal.  Left Ear: External ear normal.  Eyes: Conjunctivae and EOM are normal. Pupils are equal, round, and reactive to light. Right eye exhibits no discharge. Left eye exhibits no discharge.  Neck: Normal range of motion. Neck supple. No thyromegaly present.  Cardiovascular: Normal rate and regular rhythm.   No murmur heard. Pulmonary/Chest: Effort normal and breath sounds normal. No respiratory distress. He has no wheezes. He has no rales.  Abdominal: Soft. Bowel sounds are normal. He exhibits no distension and no mass. There is no tenderness. There is no rebound and no guarding.  Musculoskeletal: Normal range of motion.  Neurological: He is alert and oriented to person, place, and time. He has normal reflexes. No cranial nerve deficit. Coordination normal.  Skin: No rash noted. He is not diaphoretic. No erythema.  Psychiatric: He has a normal mood and affect. His behavior is normal. Judgment and thought content normal.          Assessment & Plan:

## 2010-07-19 NOTE — Patient Instructions (Signed)
Return in one month after start taking insulin

## 2010-07-19 NOTE — Assessment & Plan Note (Signed)
Continue current regimen until he can reliably take his insulin.

## 2010-08-24 ENCOUNTER — Encounter: Payer: Self-pay | Admitting: Internal Medicine

## 2010-10-25 LAB — CARDIAC PANEL(CRET KIN+CKTOT+MB+TROPI)
CK, MB: 1.8
CK, MB: 1.9
Relative Index: 1.4
Relative Index: 1.6
Relative Index: 1.6
Total CK: 127
Troponin I: 0.02
Troponin I: 0.03

## 2010-10-25 LAB — URINALYSIS, ROUTINE W REFLEX MICROSCOPIC
Glucose, UA: >1000 — AB
Leukocytes, UA: NEGATIVE
Nitrite: NEGATIVE
Protein, ur: NEGATIVE
Urobilinogen, UA: 0.2

## 2010-10-25 LAB — DIFFERENTIAL
Basophils Absolute: 0
Basophils Absolute: 0
Basophils Relative: 0
Basophils Relative: 1
Eosinophils Relative: 7 — ABNORMAL HIGH
Eosinophils Relative: 7 — ABNORMAL HIGH
Lymphocytes Relative: 19
Lymphocytes Relative: 24
Monocytes Absolute: 0.4
Neutro Abs: 3.7
Neutro Abs: 4.2

## 2010-10-25 LAB — CBC
HCT: 38.1 — ABNORMAL LOW
HCT: 38.5 — ABNORMAL LOW
Hemoglobin: 12.8 — ABNORMAL LOW
MCHC: 33.7
Platelets: 184
Platelets: 188
Platelets: 219
RBC: 4.47
RDW: 12.5
RDW: 12.8
WBC: 5.9

## 2010-10-25 LAB — POCT I-STAT, CHEM 8
BUN: 23
HCT: 41
Sodium: 137
TCO2: 26

## 2010-10-25 LAB — BASIC METABOLIC PANEL
BUN: 12
CO2: 26
CO2: 26
Calcium: 8.7
Chloride: 107
GFR calc Af Amer: >60
GFR calc non Af Amer: >60
Glucose, Bld: 394 — ABNORMAL HIGH
Potassium: 3.9
Sodium: 136
Sodium: 138

## 2010-10-25 LAB — LIPID PANEL
LDL Cholesterol: 44
Triglycerides: 387 — ABNORMAL HIGH

## 2010-10-25 LAB — URINE MICROSCOPIC-ADD ON

## 2010-10-25 LAB — HEMOGLOBIN A1C
Hgb A1c MFr Bld: 11.8 — ABNORMAL HIGH
Mean Plasma Glucose: 343

## 2011-01-25 ENCOUNTER — Encounter: Payer: Self-pay | Admitting: Internal Medicine

## 2011-02-01 ENCOUNTER — Encounter: Payer: Self-pay | Admitting: Internal Medicine

## 2011-03-08 ENCOUNTER — Other Ambulatory Visit: Payer: Self-pay | Admitting: *Deleted

## 2011-03-08 NOTE — Telephone Encounter (Signed)
Pt/wife here at the clinic; states they have insurance now and request insulin rx (Novolog)  transfer to [facility] on [address] [facility] and made awared.

## 2011-03-13 ENCOUNTER — Ambulatory Visit (INDEPENDENT_AMBULATORY_CARE_PROVIDER_SITE_OTHER): Payer: Medicaid Other | Admitting: Internal Medicine

## 2011-03-13 ENCOUNTER — Encounter: Payer: Self-pay | Admitting: Internal Medicine

## 2011-03-13 VITALS — BP 126/80 | HR 66 | Temp 98.6°F | Wt 209.8 lb

## 2011-03-13 DIAGNOSIS — E119 Type 2 diabetes mellitus without complications: Secondary | ICD-10-CM

## 2011-03-13 DIAGNOSIS — Z23 Encounter for immunization: Secondary | ICD-10-CM

## 2011-03-13 DIAGNOSIS — E785 Hyperlipidemia, unspecified: Secondary | ICD-10-CM

## 2011-03-13 LAB — POCT GLYCOSYLATED HEMOGLOBIN (HGB A1C): Hemoglobin A1C: >14

## 2011-03-13 LAB — LIPID PANEL
Cholesterol: 218 mg/dL — ABNORMAL HIGH (ref 0–200)
LDL Cholesterol: 155 mg/dL — ABNORMAL HIGH (ref 0–99)
Triglycerides: 131 mg/dL (ref <150)
VLDL: 26 mg/dL (ref 0–40)

## 2011-03-13 LAB — COMPLETE METABOLIC PANEL WITH GFR
ALT: 15 U/L (ref 0–53)
AST: 13 U/L (ref 0–37)
Albumin: 4.3 g/dL (ref 3.5–5.2)
CO2: 25 mEq/L (ref 19–32)
Calcium: 9.3 mg/dL (ref 8.4–10.5)
Chloride: 104 mEq/L (ref 96–112)
Creat: 0.92 mg/dL (ref 0.50–1.35)
GFR, Est African American: >89 mL/min
Potassium: 4.6 mEq/L (ref 3.5–5.3)

## 2011-03-13 MED ORDER — INSULIN PEN NEEDLE 31G X 5 MM MISC: Status: DC

## 2011-03-13 MED ORDER — PRAVASTATIN SODIUM 40 MG PO TABS: 40.0000 mg | ORAL_TABLET | Freq: Every day | ORAL | Status: DC

## 2011-03-13 MED ORDER — INSULIN GLARGINE 100 UNIT/ML ~~LOC~~ SOLN: 20.0000 [IU] | Freq: Every day | SUBCUTANEOUS | Status: DC

## 2011-03-13 MED ORDER — GLUCOSE BLOOD VI STRP: ORAL_STRIP | Status: DC

## 2011-03-13 MED ORDER — GLIPIZIDE 10 MG PO TABS: 10.0000 mg | ORAL_TABLET | Freq: Two times a day (BID) | ORAL | Status: DC

## 2011-03-13 MED ORDER — METFORMIN HCL 1000 MG PO TABS: 1000.0000 mg | ORAL_TABLET | Freq: Two times a day (BID) | ORAL | Status: DC

## 2011-03-13 NOTE — Patient Instructions (Signed)
Please stop taking your Novolog mix 70/30 insulin from now, and start taking Lantus from today.  Please measure your blood sugar 4 times a day.  3 times before each meals and one time at evening. It is very important. I will make adjustment for your insulin dose when you come back in 2 weeks depending your record of blood sugar level.  Please take one tablet of glipizid twice a day from now.  Please follow-up in the clinic in 2 weeks  Please take all medications as prescribed.   If you have worsening of your symptoms or new symptoms arise, please call the clinic [phone]), or go to the ER immediately if symptoms are severe.

## 2011-03-13 NOTE — Progress Notes (Signed)
Subjective:   Patient ID: [name] male   DOB: 1965-08-25 46 y.o.   MRN: [id number]  HPI: Patient is 46 years old male here with his wife and translator for a follow up visit for his DM-II. Today he does not have complaints. Due to lack of insurance, he has not taken his insulin until two days ago when he start taking 30 U of Novolg 70/30 mix.   He did not measure his CBG. He is taking Glipizide and metformin regularly. He got Medicaid insurance since a month ago. He wants to restart his treatment from now. He does not have polyuria or polydipsia. He reports that he has not taken his Pravastatin for more than two years.  Denies fever, chills, fatigue, headaches,  cough, chest pain, SOB,  abdominal pain,diarrhea, constipation, dysuria, urgency, frequency, hematuria, joint pain or leg swelling.   Past Medical History  Diagnosis Date  . Diabetes mellitus   . Hyperlipidemia   . Arthritis     left knee   Current Outpatient Prescriptions  Medication Sig Dispense Refill  . glipiZIDE (GLUCOTROL) 10 MG tablet Take 1 tablet (10 mg total) by mouth 2 (two) times daily before a meal.  120 tablet  6  . glucose blood (ONE TOUCH TEST STRIPS) test strip Test 2-3 times daily.  100 each  12  . metFORMIN (GLUCOPHAGE) 1000 MG tablet Take 1 tablet (1,000 mg total) by mouth 2 (two) times daily with a meal.  60 tablet  6  . insulin glargine (LANTUS SOLOSTAR) 100 UNIT/ML injection Inject 20 Units into the skin at bedtime.  10 mL  6  . Insulin Pen Needle 31G X 5 MM MISC Use it for insulin injection  100 each  11  . pravastatin (PRAVACHOL) 40 MG tablet Take 1 tablet (40 mg total) by mouth daily.  30 tablet  6   No family history on file. History   Social History  . Marital Status: Married    Spouse Name: N/A    Number of Children: N/A  . Years of Education: N/A   Social History Main Topics  . Smoking status: Never Smoker   . Smokeless tobacco: None  . Alcohol Use: No  . Drug Use: No  . Sexually  Active: None     immigrated from [place], speaks arabic, married   Other Topics Concern  . None   Social History Narrative  . None   Review of Systems: General: no fevers, chills, no changes in body weight, no changes in appetite Skin: no rash HEENT: no blurry vision, hearing changes or sore throat Pulm: no dyspnea, coughing, wheezing CV: no chest pain, palpitations, shortness of breath Abd: no nausea/vomiting, abdominal pain, diarrhea/constipation GU: no dysuria, hematuria, polyuria Ext: no arthralgias, myalgias Neuro: no weakness, numbness, or tingling  Objective:  Physical Exam: Filed Vitals:   03/13/11 0921  BP: 126/80  Pulse: 66  Temp: 98.6 F (37 C)  TempSrc: Oral  Weight: 209 lb 12.8 oz (95.165 kg)   General: resting in bed, not in acute distress HEENT: PERRL, EOMI, no scleral icterus Cardiac: S1/S2, RRR, No murmurs, gallops or rubs Pulm: Good air movement bilaterally, Clear to auscultation bilaterally, No rales, wheezing, rhonchi or rubs. Abd: Soft,  nondistended, nontender, no rebound pain, no organomegaly, BS present Ext: No rashes or edema, 2+DP/PT pulse bilaterally Neuro: alert and oriented X3, cranial nerves II-XII grossly intact, muscle strength 5/5 in all extremeties,  sensation to light touch intact.    Assessment & Plan:   #.  DM-II: he was diagnosed with DM-II 6 years ago. Due to lack of insurance, he has not been complaint with his treatment, particularly with his Novolog mix 70/30 insulin. His A1c is >14.0 today. He has been taking his Glipizide and metformin. But he may have also missed some doses of these two medications. Will start him on Luntus 20 U daily now. Patient is asked to measure his CBG 4 times a day and come back in 2 weeks for further adjustment of his insulin dose. Will check his Mircroalbumin/Cre ratio. Will give him referral to ophthalmologist and diabetic educator.  # HLD; his LDL was 78 on 04/21/08. He has not taken his pravastatin for  more than 2 years. Will check his lipid panel and CMP. Will start pravastatin and recheck his lipid panel in 6 weeks.  # HM: will give him Flu shot and Pneumococcal vaccination today.  Lorretta Harp

## 2011-03-14 LAB — MICROALBUMIN / CREATININE URINE RATIO: Creatinine, Urine: 103.2 mg/dL

## 2011-03-14 NOTE — Progress Notes (Signed)
I saw patient and discussed his care with resident Dr. Niu.  I agree with the clinical findings and plans as outlined in his note. 

## 2011-03-15 NOTE — Progress Notes (Signed)
I discussed patient with resident Dr. Niu, and I agree with the plans as outlined in his note. 

## 2011-03-28 ENCOUNTER — Ambulatory Visit (INDEPENDENT_AMBULATORY_CARE_PROVIDER_SITE_OTHER): Payer: Medicaid Other | Admitting: Internal Medicine

## 2011-03-28 ENCOUNTER — Encounter: Payer: Self-pay | Admitting: Internal Medicine

## 2011-03-28 VITALS — BP 131/83 | HR 80 | Temp 98.5°F | Wt 217.9 lb

## 2011-03-28 DIAGNOSIS — E119 Type 2 diabetes mellitus without complications: Secondary | ICD-10-CM

## 2011-03-28 MED ORDER — GLUCOSE BLOOD VI STRP: ORAL_STRIP | Status: DC

## 2011-03-28 MED ORDER — INSULIN GLARGINE 100 UNIT/ML ~~LOC~~ SOLN: 25.0000 [IU] | Freq: Every day | SUBCUTANEOUS | Status: DC

## 2011-03-28 MED ORDER — ACCU-CHEK SOFTCLIX LANCET DEV MISC: Status: DC

## 2011-03-28 MED ORDER — ACCU-CHEK AVIVA PLUS W/DEVICE KIT: 1.0000 | PACK | Status: DC

## 2011-03-28 NOTE — Assessment & Plan Note (Addendum)
Labs: Lab Results  Component Value Date   HGBA1C >14.0 03/13/2011   HGBA1C 11.7 02/01/2009   CREATININE 0.92 03/13/2011   CREATININE 1.02 01/14/2009   MICROALBUR 0.74 03/13/2011   MICRALBCREAT 7.2 03/13/2011   CHOL 218* 03/13/2011   HDL 37* 03/13/2011   TRIG 131 03/13/2011    Last eye exam and foot exam:    Component Value Date/Time   HMDIABEYEEXA no DM rationopathy 04/25/2009     Assessment: Status: Lost his meds 4 days ago, has still taken lantus, but unable to tell if he is taking the glipizide and metformin because he only knows by colors.  Disease Control: not controlled  Progress toward goals: unable to assess  Barriers to meeting goals: financial need   Plan: Glucometer log was not reviewed today, as pt did not have glucometer available for review.      Increase Lantus to 25 units.  reminded to bring blood glucose meter & log to each visit  Information given for actions needed if hypoglycemia occurs < 70 mg/dL blood sugar.   Glucometer RX for accucheck glucometer sent.  -------------------------------------------------------------------------------------------------------------------

## 2011-03-28 NOTE — Patient Instructions (Signed)
Please follow-up at the clinic in 2 weeks, at which time we will reevaluate your diabetes - OR, please follow-up in the clinic sooner if needed.  There have been changes in your medications:  INCREASE your Lantus to 25 units at bedtime - if you blood sugar is less than 70 mg/dL in the morning, reduce your Lantus to 23 units, and call us, and follow the instructions below for what to do for low blood sugar.    If you have been started on new medication(s), and you develop symptoms concerning for allergic reaction, including, but not limited to, throat closing, tongue swelling, rash, please stop the medication immediately and call the clinic at [phone], and go to the ER.  If you are diabetic, please bring your meter to your next visit.  If symptoms worsen, or new symptoms arise, please call the clinic or go to the ER.  Please bring all of your medications in a bag to your next visit. Hypoglycemia (Low Blood Sugar) Hypoglycemia is when the glucose (sugar) in your blood is too low. Hypoglycemia can happen for many reasons. It can happen to people with or without diabetes. Hypoglycemia can develop quickly and can be a medical emergency.   CAUSES   Having hypoglycemia does not mean that you will develop diabetes. Different causes include:  Missed or delayed meals or not enough carbohydrates eaten.     Medication overdose. This could be by accident or deliberate. If by accident, your medication may need to be adjusted or changed.     Exercise or increased activity without adjustments in carbohydrates or medications.     A nerve disorder that affects body functions like your heart rate, blood pressure and digestion (autonomic neuropathy).     A condition where the stomach muscles do not function properly (gastroparesis). Therefore, medications may not absorb properly.     The inability to recognize the signs of hypoglycemia (hypoglycemic unawareness).     Absorption of insulin - may be  altered.     Alcohol consumption.     Pregnancy/menstrual cycles/postpartum. This may be due to hormones.     Certain kinds of tumors. This is very rare.  SYMPTOMS    Sweating.     Hunger.    Dizziness.    Blurred vision.     Drowsiness.    Weakness.    Headache.    Rapid heart beat.     Shakiness.    Nervousness.  DIAGNOSIS  Diagnosis is made by monitoring blood glucose in one or all of the following ways:  Fingerstick blood glucose monitoring.     Laboratory results.  TREATMENT   If you think your blood glucose is low:  Check your blood glucose, if possible. If it is less than 70 mg/dl, take one of the following:     3-4 glucose tablets.      cup juice (prefer clear like apple).      cup "regular" soda pop.     1 cup milk.     -1 tube of glucose gel.     5-6 hard candies.     Do not over treat because your blood glucose (sugar) will only go too high.     Wait 15 minutes and recheck your blood glucose. If it is still less than 70 mg/dl (or below your target range), repeat treatment.     Eat a snack if it is more than one hour until your next meal.  Sometimes, your blood glucose  may go so low that you are unable to treat yourself. You may need someone to help you. You may even pass out or be unable to swallow. This may require you to get an injection of glucagon, which raises the blood glucose. HOME CARE INSTRUCTIONS  Check blood glucose as recommended by your caregiver.     Take medication as prescribed by your caregiver.     Follow your meal plan. Do not skip meals. Eat on time.     If you are going to drink alcohol, drink it only with meals.     Check your blood glucose before driving.     Check your blood glucose before and after exercise. If you exercise longer or different than usual, be sure to check blood glucose more frequently.     Always carry treatment with you. Glucose tablets are the easiest to carry.     Always wear medical  alert jewelry or carry some form of identification that states that you have diabetes. This will alert people that you have diabetes. If you have hypoglycemia, they will have a better idea on what to do.  SEEK MEDICAL CARE IF:    You are having problems keeping your blood sugar at target range.     You are having frequent episodes of hypoglycemia.     You feel you might be having side effects from your medicines.     You have symptoms of an illness that is not improving after 3-4 days.     You notice a change in vision or a new problem with your vision.  SEEK IMMEDIATE MEDICAL CARE IF:    You are a family member or friend of a person whose blood glucose goes below 70 mg/dl and is accompanied by:     Confusion.    A change in mental status.     The inability to swallow.     Passing out.  Document Released: 01/15/2005 Document Revised: 09/27/2010 Document Reviewed: 09/09/2008 [facility] Patient Information 2012 [place], [place].

## 2011-03-28 NOTE — Progress Notes (Signed)
Subjective:    Patient ID: [name] male   DOB: [birth year] 46 y.o.   MRN: [id number]  HPI: [name] is a 46 y.o. with a PMHx of insulin dependent DMII, uncontrolled, HLD, financial need, who presented to clinic today for the following:  1) DM, II Recheck - Last A1c > 14 on 03/13/2011 - Patient lost his meter and medications 4 days ago, therefore, is using old bottles from prior prescriptions. Patient checking blood sugars 2 times daily, before breakfast and after lunch. Reports fasting blood sugars of 230 mg/dL. Currently taking Lantus 20 units and glipizide 10mg  BID, Metformin 1000 BID - unclear which of the pills he is taking because he only knows by pill colors and shapes. 0 hypoglycemic episodes since last visit - but felt symptomatic yesterday with shaking of his body - states he starts to feel like this at blood sugars of 200 mg/dL. He had lost him glucometer 4 days ago, so could not check the blood sugar, and felt better. denies polyuria, polydipsia, nausea, vomiting, abdominal pain, diarrhea.  does not request refills today.    Review of Systems: Per HPI.  Current Outpatient Medications: Medication Sig  . insulin glargine (LANTUS SOLOSTAR) 100 UNIT/ML injection Inject 20 Units into the skin at bedtime.  . Insulin Pen Needle 31G X 5 MM MISC Use it for insulin injection  . glipiZIDE (GLUCOTROL) 10 MG tablet Take 1 tablet (10 mg total) by mouth 2 (two) times daily before a meal.  . glucose blood (ONE TOUCH TEST STRIPS) test strip Test 2-3 times daily.  . metFORMIN (GLUCOPHAGE) 1000 MG tablet Take 1 tablet (1,000 mg total) by mouth 2 (two) times daily with a meal.  . pravastatin (PRAVACHOL) 40 MG tablet Take 1 tablet (40 mg total) by mouth daily.    Allergies: Allergies  Allergen Reactions  . Penicillins     Past Medical History  Diagnosis Date  . Diabetes mellitus   . Hyperlipidemia   . Arthritis     left knee    No past surgical history on file.   Objective:     Physical Exam: Filed Vitals:   03/28/11 0844  BP: 131/83  Pulse: 80  Temp: 98.5 F (36.9 C)      General: Vital signs reviewed and noted. Well-developed, well-nourished, in no acute distress; alert, appropriate and cooperative throughout examination.  Head: Normocephalic, atraumatic.  Lungs:  Normal respiratory effort. Clear to auscultation BL without crackles or wheezes.  Heart: RRR. S1 and S2 normal without gallop, rubs. No murmur.  Abdomen:  BS normoactive. Soft, Nondistended, non-tender.  No masses or organomegaly.  Extremities: No pretibial edema.    Assessment/ Plan:   Case and plan of care discussed with Dr. Ulyess Mort.

## 2011-04-02 ENCOUNTER — Encounter: Payer: Medicaid Other | Admitting: Dietician

## 2011-04-12 ENCOUNTER — Ambulatory Visit: Payer: Medicaid Other | Admitting: Dietician

## 2011-04-12 ENCOUNTER — Encounter: Payer: Self-pay | Admitting: Internal Medicine

## 2011-04-12 ENCOUNTER — Ambulatory Visit (INDEPENDENT_AMBULATORY_CARE_PROVIDER_SITE_OTHER): Payer: Medicaid Other | Admitting: Internal Medicine

## 2011-04-12 VITALS — BP 126/81 | HR 79 | Temp 96.9°F | Ht 72.0 in | Wt 221.7 lb

## 2011-04-12 DIAGNOSIS — E785 Hyperlipidemia, unspecified: Secondary | ICD-10-CM

## 2011-04-12 DIAGNOSIS — E119 Type 2 diabetes mellitus without complications: Secondary | ICD-10-CM

## 2011-04-12 LAB — GLUCOSE, CAPILLARY: Glucose-Capillary: 436 mg/dL — ABNORMAL HIGH (ref 70–99)

## 2011-04-12 NOTE — Progress Notes (Signed)
Diabetes Self-Management Training (DSMT)  Initial Visit  04/12/2011 Mr. Paeton Studer, identified by name and date of birth, is a 46 y.o. male with Type 2 Diabetes. Year of diabetes diagnosis: 10 yrs ago Other persons present: interpreter arabic  ASSESSMENT Patient concerns are Medication, Monitoring and Glycemic control.  There were no vitals taken for this visit. There is no height or weight on file to calculate BMI. Lab Results  Component Value Date   LDLCALC 155* 03/13/2011   Lab Results  Component Value Date   HGBA1C >14.0 03/13/2011    Labs reviewed.  DIABETES BUNDLE: A1C in past 6 months? No. LDL in past year? Yes.  Less than 100 mg/dL? No Microalbumin ratio in past year? Yes. Blood pressure less than 130/80? Yes. Foot exam in last year? Yes. Eye exam in past year? Yes. Tobacco use? No. Pneumovax? Yes Flu vaccine? Yes Asprin? No  Family history of diabetes: No Support systems: spouse and friends Special needs: Engineer, structural Prior DM Education: Yes Patients belief/attitude about diabetes: Diabetes can be controlled. Self foot exams daily: Yes Diabetes Complications: None   Medications See Medications list.  Is interested in learning more and Needs skills/knowledge review   Exercise Plan Doing ADLs and walking for 60 minutesa day.   Self-Monitoring Frequency of testing: stopped testing- has new meter today- has not started using After Breakfast today 419 after milk this am- just began taking insulin 2 days ago Monitor: accu chek aviva  Hyperglycemia: Yes Daily Hypoglycemia: No   Meal Planning Limited knowledge and eats one main meal per day thatis high fat, high starch, milk for breakfast,    Assessment comments: patient here with interpreter today for meter, insulin pen and meal planning review.  Might wish to consider one injection of mixed insulin per day prior to largest meal to improve glycemic control. Weight based estimation would be ~ 25-35  units with largest meal   INDIVIDUAL DIABETES EDUCATION PLAN:  Nutrition management Physical activity and exercise Medication Monitoring Acute complication: Chronic complications _______________________________________________________________________  Intervention TOPICS COVERED TODAY:  Nutrition management  Role of diet in the treatment of diabetes and the relationship between the three main macronutritents and blood glucose control. Meal timing in regards to the patients' current diabetes medication. what to eat when it is not time to eat and he is hungry or blood sugars are high  Medication  Taught/evaluated insulin injection, site rotation, insulin storage and needle disposal. Monitoring  Taught/evaluated SMBG with aviva meter. Taught/discussed recording of test results and interpretation of SMBG. Interpreting lab values - A1C, lipid, urine microalbumina. Identified appropriate SMBG and A1C goals.  PATIENTS GOALS/PLAN (copy and paste in patient instructions so patient receives a copy): 1.  Learning Objective:                 Understand relationship of meal planning, medicine and activity in diabetes self care.     2.  Behavioral Objective:         Nutrition: To improve blood glucose control I will follow meal plan of well balanced meal three time a day Sometimes 25% Medications: To improve blood glucose levels, I will take my medication as prescribed Sometimes 25% Monitoring: To identify blood glucose trends, I will test my blood glucose at least 1-2 x daily day Never 0%  Personalized Follow-Up Plan for Ongoing Self Management Support:  Doctor's Office, friends, family and CDE visits ______________________________________________________________________   Outcomes Expected outcomes: Demonstrated interest in learning.Expect positive changes in lifestyle. Self-care Barriers:  English as second language, cultural barriers Education material provided: yes: meter, plate method and  insulin Patient to contact team via Phone if problems or questions. Time in: 1015     Time out: 1100 Future DSMT - 4-6 wks   Keron Koffman, Lupita Leash

## 2011-04-12 NOTE — Progress Notes (Signed)
Patient ID: [name], male   DOB: 13-May-1965, 46 y.o.   MRN: [id number]  HPI:   Patient is a 46 y.o. with a PMHx of insulin dependent DMII, uncontrolled, HLD, financial need, who presented to clinic today for a one month followup for his diabetes, last A1c was >14. Patient brought in a meter however it has no recordings on it given that its new. Patient checking blood sugars 2 times daily, before breakfast and after lunch. Reports fasting blood sugars of 230 mg/dL but sometimes as low as 50. Currently taking Lantus 20 units, glipizide 10mg  BID, and Metformin 1000 BID. Patient reports that he's trying to self regulates his sugars by eating only one meal a day, which may explain his sugars being high and low at different times of the.  denies polyuria, polydipsia, nausea, vomiting, abdominal pain, diarrhea. does not request refills today.   Review of Systems: Negative except per history of present illness  Physical Exam:  Nursing notes and vitals reviewed General:  alert, well-developed, and cooperative to examination.   Lungs:  normal respiratory effort, no accessory muscle use, normal breath sounds, no crackles, and no wheezes. Heart:  normal rate, regular rhythm, no murmurs, no gallop, and no rub.   Abdomen:  soft, non-tender, normal bowel sounds, no distention, no guarding, no rebound tenderness, no hepatomegaly, and no splenomegaly.   Extremities:  No cyanosis, clubbing, edema Neurologic:  alert & oriented X3, nonfocal exam  Meds: Medications Prior to Admission  Medication Sig Dispense Refill  . Blood Glucose Monitoring Suppl (ACCU-CHEK AVIVA PLUS) W/DEVICE KIT 1 Device by Does not apply route as directed. Use to test blood glucose 2-3 times daily. Dx: 250.00  1 kit  0  . glipiZIDE (GLUCOTROL) 10 MG tablet Take 1 tablet (10 mg total) by mouth 2 (two) times daily before a meal.  120 tablet  6  . glucose blood (ACCU-CHEK AVIVA) test strip Use to test blood glucose 2-3 times daily. Dx:250.00   100 each  12  . insulin glargine (LANTUS SOLOSTAR) 100 UNIT/ML injection Inject 25 Units into the skin at bedtime.  10 mL  6  . Insulin Pen Needle 31G X 5 MM MISC Use it for insulin injection  100 each  11  . Lancet Devices (ACCU-CHEK SOFTCLIX) lancets Use to test blood glucose 2-3 times daily. Dx: 250.00  1 each  0  . metFORMIN (GLUCOPHAGE) 1000 MG tablet Take 1 tablet (1,000 mg total) by mouth 2 (two) times daily with a meal.  60 tablet  6  . pravastatin (PRAVACHOL) 40 MG tablet Take 1 tablet (40 mg total) by mouth daily.  30 tablet  6   No current facility-administered medications on file as of 04/12/2011.    Allergies: Penicillins Past Medical History  Diagnosis Date  . Diabetes mellitus   . Hyperlipidemia   . Arthritis     left knee   No past surgical history on file. No family history on file. History   Social History  . Marital Status: Married    Spouse Name: N/A    Number of Children: N/A  . Years of Education: N/A   Occupational History  . Not on file.   Social History Main Topics  . Smoking status: Never Smoker   . Smokeless tobacco: Not on file  . Alcohol Use: No  . Drug Use: No  . Sexually Active: Not on file     immigrated from [place], speaks arabic, married   Other Topics Concern  .  Not on file   Social History Narrative  . No narrative on file

## 2011-04-12 NOTE — Patient Instructions (Signed)
Please check her sugars at least 3 times a day, eat healthy and balance  3 meals a day.

## 2011-04-12 NOTE — Assessment & Plan Note (Signed)
Poorly controlled, unable to make any adjustments today given the patient reports hypoglycemic episodes, and a regular diet, with patient having only one meal a day. Encouraged patient to check her sugars 3 times a day, and bring back his meter in one week for analysis and further adjustment.

## 2011-04-12 NOTE — Assessment & Plan Note (Signed)
Patient's LDL is elevated, however he's not fully compliant with his medication. Encourage compliance today

## 2011-04-12 NOTE — Patient Instructions (Signed)
Your LDL cholesterol is 155. This is too high. It should be < 100. This cholesterol can be decreased by eating more lower FAT foods; Eat more vegetables, fruits and starchy foods prepared with less oil,  Baked, grilled, boiled or stir-fried meats, 1% or skim milk, low fat cheese.  Eat three times each day with about 4-5 hours in between at about the same times.   Take your diabetes medicine at the same time each day

## 2011-04-19 ENCOUNTER — Ambulatory Visit (INDEPENDENT_AMBULATORY_CARE_PROVIDER_SITE_OTHER): Payer: Medicaid Other | Admitting: Internal Medicine

## 2011-04-19 ENCOUNTER — Encounter: Payer: Self-pay | Admitting: Internal Medicine

## 2011-04-19 VITALS — BP 114/79 | HR 65 | Temp 96.9°F | Wt 219.4 lb

## 2011-04-19 DIAGNOSIS — E109 Type 1 diabetes mellitus without complications: Secondary | ICD-10-CM

## 2011-04-19 DIAGNOSIS — E119 Type 2 diabetes mellitus without complications: Secondary | ICD-10-CM

## 2011-04-19 LAB — GLUCOSE, CAPILLARY: Glucose-Capillary: 165 mg/dL — ABNORMAL HIGH (ref 70–99)

## 2011-04-19 MED ORDER — INSULIN ASPART 100 UNIT/ML ~~LOC~~ SOLN: SUBCUTANEOUS | Status: DC

## 2011-04-19 MED ORDER — INSULIN GLARGINE 100 UNIT/ML ~~LOC~~ SOLN: 30.0000 [IU] | Freq: Every day | SUBCUTANEOUS | Status: DC

## 2011-04-19 NOTE — Assessment & Plan Note (Addendum)
Patient has been checking his sugars intensity since last week, despite taking Lantus 25, metformin and glipizide daily while eating 3 times a day. His sugars are ranging between 120-400. Given this I will increase his Lantus to 30 units and start the patient on NovoLog to be used 3 times a day with a dosage schedule according to sliding scale which I have provided, this scale may be adjusted to meet patient's needs. We'll recheck diabetic control within one month to make further adjustments.   70-130  >> 0 units 131-180 >> 2 units 181-240 >> 4 units 241-300 >> 6 units  301-350 >> 8 units  351-400 >>10 units  >400     >> 12 units

## 2011-04-19 NOTE — Patient Instructions (Addendum)
Increase her Lantus to 30 units  Using a fast acting NOVOLOG 3 times a day after meals, dosage use as guided below, by your CBGs as discussed.  If your CBG are 70-130  >> 0 units 131-180 >> 2 units 181-240 >> 4 units 241-300 >> 6 units  301-350 >> 8 units  351-400 >>10 units  >400     >> 12 units

## 2011-04-19 NOTE — Progress Notes (Signed)
Patient ID: [name], male   DOB: [birth year], 46 y.o.   MRN: [id number]  HPI:   Patient is a pleasant 46 year old Arabic speaking male, who presents to the clinic for a one-week followup for his poorly controlled diabetes, patient's A1c was more than 14 last time it was checked. Today he brings in his CBG log which shows CBGs ranging between 120-400, this is on Lantus 25, metformin, and glipizide. Patient reports eating 3 meals a day now since prior visit counseling, as previously patient was eating only once a day in attempt to bring down his sugars. No other complaints today.   Review of Systems: Negative except per history of present illness  Physical Exam:  Nursing notes and vitals reviewed General:  alert, well-developed, and cooperative to examination.   Lungs:  normal respiratory effort, no accessory muscle use, normal breath sounds, no crackles, and no wheezes. Heart:  normal rate, regular rhythm, no murmurs, no gallop, and no rub.   Abdomen:  soft, non-tender, normal bowel sounds, no distention, no guarding, no rebound tenderness, no hepatomegaly, and no splenomegaly.   Extremities:  No cyanosis, clubbing, edema Neurologic:  alert & oriented X3, nonfocal exam  Meds: Medications Prior to Admission  Medication Sig Dispense Refill  . Blood Glucose Monitoring Suppl (ACCU-CHEK AVIVA PLUS) W/DEVICE KIT 1 Device by Does not apply route as directed. Use to test blood glucose 2-3 times daily. Dx: 250.00  1 kit  0  . glipiZIDE (GLUCOTROL) 10 MG tablet Take 1 tablet (10 mg total) by mouth 2 (two) times daily before a meal.  120 tablet  6  . glucose blood (ACCU-CHEK AVIVA) test strip Use to test blood glucose 2-3 times daily. Dx:250.00  100 each  12  . Insulin Pen Needle 31G X 5 MM MISC Use it for insulin injection  100 each  11  . Lancet Devices (ACCU-CHEK SOFTCLIX) lancets Use to test blood glucose 2-3 times daily. Dx: 250.00  1 each  0  . metFORMIN (GLUCOPHAGE) 1000 MG tablet Take 1 tablet  (1,000 mg total) by mouth 2 (two) times daily with a meal.  60 tablet  6  . pravastatin (PRAVACHOL) 40 MG tablet Take 1 tablet (40 mg total) by mouth daily.  30 tablet  6   No current facility-administered medications on file as of 04/19/2011.    Allergies: Penicillins Past Medical History  Diagnosis Date  . Diabetes mellitus   . Hyperlipidemia   . Arthritis     left knee   No past surgical history on file. No family history on file. History   Social History  . Marital Status: Married    Spouse Name: N/A    Number of Children: N/A  . Years of Education: N/A   Occupational History  . Not on file.   Social History Main Topics  . Smoking status: Never Smoker   . Smokeless tobacco: Not on file  . Alcohol Use: No  . Drug Use: No  . Sexually Active: Not on file     immigrated from [place], speaks arabic, married   Other Topics Concern  . Not on file   Social History Narrative  . No narrative on file

## 2011-04-24 ENCOUNTER — Encounter: Payer: Self-pay | Admitting: *Deleted

## 2011-04-24 NOTE — Progress Notes (Unsigned)
Pt walked into clinic with another pt who has appt with Dr Anselm Jungling - states for past 2 days itching all over after using Novolog flex pen. Dr Gilford Rile saw pt and talked few minutes in hallway since pt speaks Arabic - aware of problem. Suggest to make appt - not today. Appt given to pt 04/26/11 10:30AM. Stanton Kidney Zeva Leber RN 04/24/11 3:30PM

## 2011-04-26 ENCOUNTER — Ambulatory Visit (INDEPENDENT_AMBULATORY_CARE_PROVIDER_SITE_OTHER): Payer: Medicaid Other | Admitting: Internal Medicine

## 2011-04-26 ENCOUNTER — Encounter: Payer: Self-pay | Admitting: Internal Medicine

## 2011-04-26 VITALS — BP 123/84 | HR 69 | Temp 97.1°F | Ht 72.0 in | Wt 223.8 lb

## 2011-04-26 DIAGNOSIS — E119 Type 2 diabetes mellitus without complications: Secondary | ICD-10-CM

## 2011-04-26 LAB — GLUCOSE, CAPILLARY: Glucose-Capillary: 179 mg/dL — ABNORMAL HIGH (ref 70–99)

## 2011-04-26 MED ORDER — INSULIN LISPRO 100 UNIT/ML ~~LOC~~ SOLN: 5.0000 [IU] | Freq: Three times a day (TID) | SUBCUTANEOUS | Status: DC

## 2011-04-26 NOTE — Assessment & Plan Note (Addendum)
Patient has been checking his sugars intensity since last week, despite taking Lantus 30, metformin and glipizide daily while eating 3 times a day. His sugars are ranging between 90-300. Given his recent reaction to NovoLog of a rash after injection, we'll discontinue this today. Given hypoglycemia preprandially and hyperglycemic postprandially, will try the patient on Humalog 5 units 3 times a day before meals. No further adjustments to Lantus for now. We'll continue monitoring his sugars, and see back here in one month to make further adjustments

## 2011-04-26 NOTE — Patient Instructions (Addendum)
Stop taking Novolog  Start taking Humalog 5 units 3 times a day before meals Continue taking Lantus 30 units QHS

## 2011-04-26 NOTE — Progress Notes (Signed)
Patient ID: [name], male   DOB: [birth year], 46 y.o.   MRN: [id number]  HPI:   Patient is a pleasant 46 year old Arabic speaking male, who presents to the clinic for a one-week followup for his poorly controlled diabetes, patient's A1c was more than 14 last time it was checked. Today he brings in his CBG log which shows CBGs ranging between 120-400, this is on Lantus 30, metformin, and glipizide. Patient reports eating 3 meals a day now since prior visit counseling, as previously patient was eating only once a day in attempt to bring down his sugars. Since last visit patient was started on NovoLog sliding scale, however upon taking this he developed a rash to one of the components in the insulin, and therefore is not taking it anymore. No other complaints today.   Review of Systems: Negative except per history of present illness  Physical Exam:  Nursing notes and vitals reviewed General:  alert, well-developed, and cooperative to examination.   Lungs:  normal respiratory effort, no accessory muscle use, normal breath sounds, no crackles, and no wheezes. Heart:  normal rate, regular rhythm, no murmurs, no gallop, and no rub.   Abdomen:  soft, non-tender, normal bowel sounds, no distention, no guarding, no rebound tenderness, no hepatomegaly, and no splenomegaly.   Extremities:  No cyanosis, clubbing, edema Neurologic:  alert & oriented X3, nonfocal exam  Meds: Medications Prior to Admission  Medication Sig Dispense Refill  . Blood Glucose Monitoring Suppl (ACCU-CHEK AVIVA PLUS) W/DEVICE KIT 1 Device by Does not apply route as directed. Use to test blood glucose 2-3 times daily. Dx: 250.00  1 kit  0  . glipiZIDE (GLUCOTROL) 10 MG tablet Take 1 tablet (10 mg total) by mouth 2 (two) times daily before a meal.  120 tablet  6  . glucose blood (ACCU-CHEK AVIVA) test strip Use to test blood glucose 2-3 times daily. Dx:250.00  100 each  12  . insulin glargine (LANTUS SOLOSTAR) 100 UNIT/ML injection  Inject 30 Units into the skin at bedtime.  10 mL  6  . Insulin Pen Needle 31G X 5 MM MISC Use it for insulin injection  100 each  11  . Lancet Devices (ACCU-CHEK SOFTCLIX) lancets Use to test blood glucose 2-3 times daily. Dx: 250.00  1 each  0  . metFORMIN (GLUCOPHAGE) 1000 MG tablet Take 1 tablet (1,000 mg total) by mouth 2 (two) times daily with a meal.  60 tablet  6  . pravastatin (PRAVACHOL) 40 MG tablet Take 1 tablet (40 mg total) by mouth daily.  30 tablet  6   No current facility-administered medications on file as of 04/26/2011.    Allergies: Novolog and Penicillins Past Medical History  Diagnosis Date  . Diabetes mellitus   . Hyperlipidemia   . Arthritis     left knee   No past surgical history on file. No family history on file. History   Social History  . Marital Status: Married    Spouse Name: N/A    Number of Children: N/A  . Years of Education: N/A   Occupational History  . Not on file.   Social History Main Topics  . Smoking status: Never Smoker   . Smokeless tobacco: Not on file  . Alcohol Use: No  . Drug Use: No  . Sexually Active: Not on file     immigrated from [place], speaks arabic, married   Other Topics Concern  . Not on file   Social History Narrative  .  No narrative on file

## 2011-04-26 NOTE — Progress Notes (Signed)
Addended by: Darnelle Maffucci on: 04/26/2011 11:13 AM   Modules accepted: Orders

## 2011-05-21 ENCOUNTER — Encounter: Payer: Medicaid Other | Admitting: Internal Medicine

## 2011-07-23 ENCOUNTER — Emergency Department (HOSPITAL_COMMUNITY)
Admission: EM | Admit: 2011-07-23 | Discharge: 2011-07-23 | Disposition: A | Discharge status: Home / Self Care | Payer: Self-pay | Attending: Emergency Medicine | Admitting: Emergency Medicine

## 2011-07-23 ENCOUNTER — Encounter (HOSPITAL_COMMUNITY): Payer: Self-pay | Admitting: *Deleted

## 2011-07-23 ENCOUNTER — Emergency Department (HOSPITAL_COMMUNITY): Payer: Self-pay

## 2011-07-23 DIAGNOSIS — E785 Hyperlipidemia, unspecified: Secondary | ICD-10-CM | POA: Insufficient documentation

## 2011-07-23 DIAGNOSIS — E119 Type 2 diabetes mellitus without complications: Secondary | ICD-10-CM | POA: Insufficient documentation

## 2011-07-23 DIAGNOSIS — Z8739 Personal history of other diseases of the musculoskeletal system and connective tissue: Secondary | ICD-10-CM | POA: Insufficient documentation

## 2011-07-23 DIAGNOSIS — M546 Pain in thoracic spine: Secondary | ICD-10-CM | POA: Insufficient documentation

## 2011-07-23 DIAGNOSIS — IMO0002 Reserved for concepts with insufficient information to code with codable children: Secondary | ICD-10-CM | POA: Insufficient documentation

## 2011-07-23 MED ORDER — IBUPROFEN 800 MG PO TABS
800.0000 mg | ORAL_TABLET | Freq: Once | ORAL | Status: AC
  Administered 2011-07-23: 800 mg via ORAL
  Filled 2011-07-23: qty 1

## 2011-07-23 MED ORDER — GI COCKTAIL ~~LOC~~
30.0000 mL | Freq: Once | ORAL | Status: AC
  Administered 2011-07-23: 30 mL via ORAL
  Filled 2011-07-23: qty 30

## 2011-07-23 MED ORDER — TETANUS-DIPHTHERIA TOXOIDS TD 5-2 LFU IM INJ
0.5000 mL | INJECTION | Freq: Once | INTRAMUSCULAR | Status: AC
  Administered 2011-07-23: 0.5 mL via INTRAMUSCULAR
  Filled 2011-07-23: qty 0.5
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
## 2011-07-23 NOTE — Discharge Instructions (Signed)
Muscle Strain A muscle strain, or pulled muscle, occurs when a muscle is over-stretched. A small number of muscle fibers may also be torn. This is especially common in athletes. This happens when a sudden violent force placed on a muscle pushes it past its capacity. Usually, recovery from a pulled muscle takes 1 to 2 weeks. But complete healing will take 5 to 6 weeks. There are millions of muscle fibers. Following injury, your body will usually return to normal quickly. HOME CARE INSTRUCTIONS   While awake, apply ice to the sore muscle for 15 to 20 minutes each hour for the first 2 days. Put ice in a plastic bag and place a towel between the bag of ice and your skin.   Do not use the pulled muscle for several days. Do not use the muscle if you have pain.   You may wrap the injured area with an elastic bandage for comfort. Be careful not to bind it too tightly. This may interfere with blood circulation.   Only take over-the-counter or prescription medicines for pain, discomfort, or fever as directed by your caregiver. Do not use aspirin as this will increase bleeding (bruising) at injury site.   Warming up before exercise helps prevent muscle strains.  SEEK MEDICAL CARE IF:  There is increased pain or swelling in the affected area. MAKE SURE YOU:   Understand these instructions.   Will watch your condition.   Will get help right away if you are not doing well or get worse.  Document Released: 01/15/2005 Document Revised: 01/04/2011 Document Reviewed: 08/14/2006 [employer] Patient Information 2012 [employer]. 
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
## 2011-07-23 NOTE — ED Notes (Signed)
Per family member patient feels better. Requesting D/C home. Dr. Freida Busman aware and working on D/C paperwork.

## 2011-08-23 ENCOUNTER — Ambulatory Visit: Payer: Medicaid Other | Admitting: Internal Medicine

## 2011-12-25 ENCOUNTER — Encounter: Payer: Self-pay | Admitting: Internal Medicine

## 2011-12-25 ENCOUNTER — Ambulatory Visit (INDEPENDENT_AMBULATORY_CARE_PROVIDER_SITE_OTHER): Payer: Self-pay | Admitting: Internal Medicine

## 2011-12-25 VITALS — BP 123/74 | HR 74 | Temp 98.2°F | Resp 20 | Ht 74.0 in | Wt 205.3 lb

## 2011-12-25 DIAGNOSIS — Z23 Encounter for immunization: Secondary | ICD-10-CM

## 2011-12-25 DIAGNOSIS — Z0000 Encounter for general adult medical examination without abnormal findings: Secondary | ICD-10-CM | POA: Insufficient documentation

## 2011-12-25 DIAGNOSIS — E119 Type 2 diabetes mellitus without complications: Secondary | ICD-10-CM

## 2011-12-25 DIAGNOSIS — Z79899 Other long term (current) drug therapy: Secondary | ICD-10-CM

## 2011-12-25 DIAGNOSIS — E785 Hyperlipidemia, unspecified: Secondary | ICD-10-CM

## 2011-12-25 HISTORY — DX: Encounter for general adult medical examination without abnormal findings: Z00.00

## 2011-12-25 LAB — COMPLETE METABOLIC PANEL WITH GFR
ALT: 19 U/L (ref 0–53)
CO2: 26 mEq/L (ref 19–32)
Calcium: 9.7 mg/dL (ref 8.4–10.5)
Chloride: 101 mEq/L (ref 96–112)
GFR, Est African American: >89 mL/min
GFR, Est Non African American: >89 mL/min
Glucose, Bld: 362 mg/dL — ABNORMAL HIGH (ref 70–99)
Sodium: 135 mEq/L (ref 135–145)
Total Protein: 7.8 g/dL (ref 6.0–8.3)

## 2011-12-25 LAB — POCT GLYCOSYLATED HEMOGLOBIN (HGB A1C): Hemoglobin A1C: >14

## 2011-12-25 LAB — LIPID PANEL
HDL: 43 mg/dL (ref >39)
LDL Cholesterol: 145 mg/dL — ABNORMAL HIGH (ref 0–99)
Triglycerides: 205 mg/dL — ABNORMAL HIGH (ref <150)

## 2011-12-25 LAB — GLUCOSE, CAPILLARY: Glucose-Capillary: 341 mg/dL — ABNORMAL HIGH (ref 70–99)

## 2011-12-25 MED ORDER — "INSULIN SYRINGE 31G X 5/16"" 0.5 ML MISC": 1.0000 | Freq: Every day | Status: DC

## 2011-12-25 MED ORDER — PRAVASTATIN SODIUM 40 MG PO TABS: 40.0000 mg | ORAL_TABLET | Freq: Every day | ORAL | Status: DC

## 2011-12-25 MED ORDER — INSULIN GLARGINE 100 UNIT/ML ~~LOC~~ SOLN: SUBCUTANEOUS | Status: DC

## 2011-12-25 MED ORDER — METFORMIN HCL 1000 MG PO TABS: 1000.0000 mg | ORAL_TABLET | Freq: Two times a day (BID) | ORAL | Status: DC

## 2011-12-25 NOTE — Patient Instructions (Addendum)
Mr. Hurd -   Today your blood sugar was very high and your A1C, which is a marker of your blood sugar over the last three months was very high.  You will need to be started back on medications for your diabetes.   You should take - Lantus insulin 15 Units at bedtime.  You should also take Metformin 1000mg  twice a day by mouth.  Please do not take any other insulin as we are working to get you on the correct medications.  You will need to be seen back in the clinic very soon (December) to see your primary care physician Dr. Clyde Lundborg.    Please check your blood sugar 2-3 times daily and please either bring in your blood glucose meter at the next visit, or write down your blood sugars so that we can look them over.   You will be getting your prescriptions through the health department.  For the Lantus, you will need to sign up for the MAP program.  Please do that today. You will have a sample of Lantus today to get you started.   Please schedule a follow up appointment with Dr. Clyde Lundborg in December.  If he is not available, please schedule with any available provider.    Make an appointment with your eye doctor, Dr. Heather Burundi to get your eyes evaluated.  Thank you for seeing [place] today at the outpatient clinic.    Blood Sugar Monitoring, Adult GLUCOSE METERS FOR SELF-MONITORING OF BLOOD GLUCOSE  It is important to be able to correctly measure your blood sugar (glucose). You can use a blood glucose monitor (a small battery-operated device) to check your glucose level at any time. This allows you and your caregiver to monitor your diabetes and to determine how well your treatment plan is working. The process of monitoring your blood glucose with a glucose meter is called self-monitoring of blood glucose (SMBG). When people with diabetes control their blood sugar, they have better health. To test for glucose with a typical glucose meter, place the disposable strip in the meter. Then place a small sample of  blood on the "test strip." The test strip is coated with chemicals that combine with glucose in blood. The meter measures how much glucose is present. The meter displays the glucose level as a number. Several new models can record and store a number of test results. Some models can connect to personal computers to store test results or print them out.  Newer meters are often easier to use than older models. Some meters allow you to get blood from places other than your fingertip. Some new models have automatic timing, error codes, signals, or barcode readers to help with proper adjustment (calibration). Some meters have a large display screen or spoken instructions for people with visual impairments.   INSTRUCTIONS FOR USING GLUCOSE METERS  Wash your hands with soap and warm water, or clean the area with alcohol. Dry your hands completely.  Prick the side of your fingertip with a lancet (a sharp-pointed tool used by hand).  Hold the hand down and gently milk the finger until a small drop of blood appears. Catch the blood with the test strip.  Follow the instructions for inserting the test strip and using the SMBG meter. Most meters require the meter to be turned on and the test strip to be inserted before applying the blood sample.  Record the test result.  Read the instructions carefully for both the meter and the test  strips that go with it. Meter instructions are found in the user manual. Keep this manual to help you solve any problems that may arise. Many meters use "error codes" when there is a problem with the meter, the test strip, or the blood sample on the strip. You will need the manual to understand these error codes and fix the problem.  New devices are available such as laser lancets and meters that can test blood taken from "alternative sites" of the body, other than fingertips. However, you should use standard fingertip testing if your glucose changes rapidly. Also, use standard  testing if:  You have eaten, exercised, or taken insulin in the past 2 hours.  You think your glucose is low.  You tend to not feel symptoms of low blood glucose (hypoglycemia).  You are ill or under stress.  Clean the meter as directed by the manufacturer.  Test the meter for accuracy as directed by the manufacturer.  Take your meter with you to your caregiver's office. This way, you can test your glucose in front of your caregiver to make sure you are using the meter correctly. Your caregiver can also take a sample of blood to test using a routine lab method. If values on the glucose meter are close to the lab results, you and your caregiver will see that your meter is working well and you are using good technique. Your caregiver will advise you about what to do if the results do not match. FREQUENCY OF TESTING  Your caregiver will tell you how often you should check your blood glucose. This will depend on your type of diabetes, your current level of diabetes control, and your types of medicines. The following are general guidelines, but your care plan may be different. Record all your readings and the time of day you took them for review with your caregiver.   Diabetes type 1.  When you are using insulin with good diabetic control (either multiple daily injections or via a pump), you should check your glucose 4 times a day.  If your diabetes is not well controlled, you may need to monitor more frequently, including before meals and 2 hours after meals, at bedtime, and occasionally between 2 a.m. and 3 a.m.  You should always check your glucose before a dose of insulin or before changing the rate on your insulin pump.  Diabetes type 2.  Guidelines for SMBG in diabetes type 2 are not as well defined.  If you are on insulin, follow the guidelines above.  If you are on medicines, but not insulin, and your glucose is not well controlled, you should test at least twice daily.  If you  are not on insulin, and your diabetes is controlled with medicines or diet alone, you should test at least once daily, usually before breakfast.  A weekly profile will help your caregiver advise you on your care plan. The week before your visit, check your glucose before a meal and 2 hours after a meal at least daily. You may want to test before and after a different meal each day so you and your caregiver can tell how well controlled your blood sugars are throughout the course of a 24 hour period.  Gestational diabetes (diabetes during pregnancy).  Frequent testing is often necessary. Accurate timing is important.  If you are not on insulin, check your glucose 4 times a day. Check it before breakfast and 1 hour after the start of each meal.  If you  are on insulin, check your glucose 6 times a day. Check it before each meal and 1 hour after the first bite of each meal.  General guidelines.  More frequent testing is required at the start of insulin treatment. Your caregiver will instruct you.  Test your glucose any time you suspect you have low blood sugar (hypoglycemia).  You should test more often when you change medicines, when you have unusual stress or illness, or in other unusual circumstances. OTHER THINGS TO KNOW ABOUT GLUCOSE METERS  Measurement Range. Most glucose meters are able to read glucose levels over a broad range of values from as low as 0 to as high as 600 mg/dL. If you get an extremely high or low reading from your meter, you should first confirm it with another reading. Report very high or very low readings to your caregiver.  Whole Blood Glucose versus Plasma Glucose. Some older home glucose meters measure glucose in your whole blood. In a lab or when using some newer home glucose meters, the glucose is measured in your plasma (one component of blood). The difference can be important. It is important for you and your caregiver to know whether your meter gives its results  as "whole blood equivalent" or "plasma equivalent."  Display of High and Low Glucose Values. Part of learning how to operate a meter is understanding what the meter results mean. Know how high and low glucose concentrations are displayed on your meter.  Factors that Affect Glucose Meter Performance. The accuracy of your test results depends on many factors and varies depending on the brand and type of meter. These factors include:  Low red blood cell count (anemia).  Substances in your blood (such as uric acid, vitamin C, and others).  Environmental factors (temperature, humidity, altitude).  Name-brand versus generic test strips.  Calibration. Make sure your meter is set up properly. It is a good idea to do a calibration test with a control solution recommended by the manufacturer of your meter whenever you begin using a fresh bottle of test strips. This will help verify the accuracy of your meter.  Improperly stored, expired, or defective test strips. Keep your strips in a dry place with the lid on.  Soiled meter.  Inadequate blood sample. NEW TECHNOLOGIES FOR GLUCOSE TESTING Alternative site testing Some glucose meters allow testing blood from alternative sites. These include the:  Upper arm.  Forearm.  Base of the thumb.  Thigh. Sampling blood from alternative sites may be desirable. However, it may have some limitations. Blood in the fingertips show changes in glucose levels more quickly than blood in other parts of the body. This means that alternative site test results may be different from fingertip test results, not because of the meter's ability to test accurately, but because the actual glucose concentration can be different.  Continuous Glucose Monitoring Devices to measure your blood glucose continuously are available, and others are in development. These methods can be more expensive than self-monitoring with a glucose meter. However, it is uncertain how effective and  reliable these devices are. Your caregiver will advise you if this approach makes sense for you. IF BLOOD SUGARS ARE CONTROLLED, PEOPLE WITH DIABETES REMAIN HEALTHIER.  SMBG is an important part of the treatment plan of patients with diabetes mellitus. Below are reasons for using SMBG:   It confirms that your glucose is at a specific, healthy level.  It detects hypoglycemia and severe hyperglycemia.  It allows you and your caregiver to make  adjustments in response to changes in lifestyle for individuals requiring medicine.  It determines the need for starting insulin therapy in temporary diabetes that happens during pregnancy (gestational diabetes). Document Released: 01/18/2003 Document Revised: 04/09/2011 Document Reviewed: 05/11/2010 [facility] Patient Information 2013 [place], [place].   Low Blood Sugar Low blood sugar (hypoglycemia) means that the level of sugar in your blood is lower than it should be. Signs of low blood sugar include:  Getting sweaty.  Feeling hungry.  Feeling dizzy or weak.  Feeling sleepier than normal.  Feeling nervous.  Headaches.  Having a fast heartbeat. Low blood sugar can happen fast and can be an emergency. Your doctor can do tests to check your blood sugar level. You can have low blood sugar and not have diabetes. HOME CARE  Check your blood sugar as told by your doctor. If it is less than 70 mg/dl or as told by your doctor, take 1 of the following:  3 to 4 glucose tablets.   cup clear juice.   cup soda pop, not diet.  1 cup milk.  5 to 6 hard candies.  Recheck blood sugar after 15 minutes. Repeat until it is at the right level.  Eat a snack if it is more than 1 hour until the next meal.  Only take medicine as told by your doctor.  Do not skip meals. Eat on time.  Do not drink alcohol except with meals.  Check your blood glucose before driving.  Check your blood glucose before and after exercise.  Always carry treatment with  you, such as glucose pills.  Always wear a medical alert bracelet if you have diabetes. GET HELP RIGHT AWAY IF:   Your blood glucose goes below 70 mg/dl or as told by your doctor, and you:  Are confused.  Are not able to swallow.  Pass out (faint).  You cannot treat yourself. You may need someone to help you.  You have low blood sugar problems often.  You have problems from your medicines.  You are not feeling better after 3 to 4 days.  You have vision changes. MAKE SURE YOU:   Understand these instructions.  Will watch this condition.  Will get help right away if you are not doing well or get worse. Document Released: 04/11/2009 Document Revised: 04/09/2011 Document Reviewed: 04/11/2009 [facility] Patient Information 2013 [place], [place].

## 2011-12-25 NOTE — Assessment & Plan Note (Addendum)
Have rechecked a lipid panel today.   Restarted pravastatin, most likely will be needed as last LDL was > 100 (155).   Follow up as noted above.   Update: LDL 145, pravastatin restarted, recheck at next visit for better control.

## 2011-12-25 NOTE — Assessment & Plan Note (Addendum)
Patient with no complaints today, but has not been taking his medications regularly and has only been taking insulin intermittently.   A1C is 14.0 Monofilament today does not reveal neuropathy Cr 0.98; MAU/Cr is pending Requested that he make eye exam appointment; last check up in Feb of this year, no retinopathy LDL is pending BP is 123/74 today, on no medications.  Continue to monitor.  We have called in a Rx for the [facility] monitor, strips and lancets that he can get through the health department.  He is now getting his medications there and we requested that he sign up for the MAP program  Will plan to restart Mr. [name] on metformin 1000mg  BID as well as Lantus 15 Units Qhs.  Have requested that he make an appointment with Dr. Clyde Lundborg (assigned PCP) in December for further follow up  Update: MAU/Cr 9.9, renal function normal, LDL 145

## 2011-12-25 NOTE — Progress Notes (Signed)
  Subjective:    Patient ID: [name], male    DOB: [birth year], 46 y.o.   MRN: [id number]  CC: Refills on medications  HPI  [name] is a 46yo man with DM type 2 who presents today because he has been out of his medications for the last 2 months because he ran out of money.  He was previously taking glipizide, metformin, Lantus for his diabetes and pravastatin for his HLD.  He has only been taking intermittent insulin and has also not been able to check his sugars.  He would like to get plugged back in to get his medications and get his disease under control.  He currently denies any symptoms including blurry vision, headache, polyuria, polydipsia, fever, chills, numbness/tingling or the LE, abdominal pain.  His A1C today is noted to be > 14.  No other issues noted today.    Review of Systems  Constitutional: Negative for fever, chills, activity change and fatigue.  HENT: Negative for hearing loss, ear pain, sore throat and trouble swallowing.   Eyes: Negative for pain and visual disturbance.       Denies blurry vision  Respiratory: Negative for cough, choking, chest tightness and shortness of breath.   Cardiovascular: Negative for chest pain, palpitations and leg swelling.  Gastrointestinal: Negative for nausea, vomiting, diarrhea and abdominal distention.  Genitourinary: Negative for dysuria and difficulty urinating.  Musculoskeletal: Negative for back pain, arthralgias and gait problem.  Skin: Negative for rash and wound.  Neurological: Negative for dizziness, weakness, light-headedness, numbness and headaches.  Hematological: Negative for adenopathy. Does not bruise/bleed easily.  Psychiatric/Behavioral: Negative for behavioral problems and dysphoric mood.       Objective:   Physical Exam  Constitutional: He is oriented to person, place, and time. He appears well-developed and well-nourished. No distress.  HENT:  Head: Normocephalic and atraumatic.  Eyes: EOM are normal. No  scleral icterus.  Neck: Neck supple.  Cardiovascular: Normal rate, regular rhythm, normal heart sounds and intact distal pulses.   No murmur heard. Pulmonary/Chest: Effort normal and breath sounds normal. No respiratory distress.  Abdominal: Soft. Bowel sounds are normal.  Musculoskeletal: He exhibits no edema and no tenderness.  Neurological: He is alert and oriented to person, place, and time.  Skin: Skin is warm and dry. No rash noted. He is not diaphoretic. No pallor.  Psychiatric: He has a normal mood and affect. His behavior is normal.    A1C > 14.0  CMP     Component Value Date/Time   NA 135 12/25/2011 1016   K 4.4 12/25/2011 1016   CL 101 12/25/2011 1016   CO2 26 12/25/2011 1016   GLUCOSE 362* 12/25/2011 1016   BUN 20 12/25/2011 1016   CREATININE 0.98 12/25/2011 1016   CREATININE 1.02 01/14/2009 1648   CALCIUM 9.7 12/25/2011 1016   PROT 7.8 12/25/2011 1016   ALBUMIN 3.6 12/25/2011 1016   AST 17 12/25/2011 1016   ALT 19 12/25/2011 1016   ALKPHOS 111 12/25/2011 1016   BILITOT 0.3 12/25/2011 1016   GFRNONAA >60 06/18/2007 0520   GFRAA  Value: >60        The eGFR has been calculated using the MDRD equation. This calculation has not been validated in all clinical 06/18/2007 0520       Assessment & Plan:  Please see problem oriented charting.

## 2011-12-25 NOTE — Assessment & Plan Note (Signed)
Flu shot today.   Will need discussion re: further preventative maintenance at PCP visit.

## 2011-12-25 NOTE — Progress Notes (Addendum)
Subjective:     Patient ID: [name], male   DOB: 28-Sep-1965, 46 y.o.   MRN: [id number]  HPI 46 year old patient with a PMH of DM, HTN, HL presents to the clinic for a check up and medication refills. His HbA1c was checked and found to be >14, with a blood glucose of 341. For the past 3 months the patient has been taking 1 shot of insulin, 10 or 15 units daily. He does not check his glucose daily. The patient has run out of all his other medications for the past 3 months. The patient has no complaints and would simply like medications refilled and a general check up.  Past Medical History  Diagnosis Date  . Diabetes mellitus   . Hyperlipidemia   . Arthritis     left knee   No past surgical history on file.  Family history: Mother - DM  Social history: Tobacco use - None Etoh - None Drugs - None  Review of Systems  Constitutional: Negative.   HENT: Negative.   Eyes: Negative.   Respiratory: Negative.   Cardiovascular: Negative.   Gastrointestinal: Negative.   Genitourinary: Negative.   Musculoskeletal: Negative.   Skin: Negative.   Neurological: Negative.   Hematological: Negative.   Psychiatric/Behavioral: Negative.        Objective:   Physical Exam  Constitutional: He is oriented to person, place, and time. He appears well-developed and well-nourished. No distress.  HENT:  Head: Normocephalic and atraumatic.  Nose: Nose normal.  Mouth/Throat: Oropharynx is clear and moist. No oropharyngeal exudate.  Eyes: Conjunctivae normal are normal. Pupils are equal, round, and reactive to light. Right eye exhibits no discharge. Left eye exhibits no discharge. No scleral icterus.  Neck: Normal range of motion. No tracheal deviation present. No thyromegaly present.  Cardiovascular: Normal rate, regular rhythm, normal heart sounds and intact distal pulses.  Exam reveals no gallop and no friction rub.   No murmur heard. Pulmonary/Chest: Effort normal and breath sounds normal. No  respiratory distress. He has no wheezes. He has no rales. He exhibits no tenderness.  Abdominal: Soft. Bowel sounds are normal. He exhibits no distension. There is no tenderness.  Musculoskeletal: Normal range of motion. He exhibits no edema and no tenderness.  Lymphadenopathy:    He has no cervical adenopathy.  Neurological: He is alert and oriented to person, place, and time. He has normal reflexes. No cranial nerve deficit.  Skin: No rash noted. He is not diaphoretic. No erythema.  Psychiatric: He has a normal mood and affect.       Assessment/Plan:     46 year old patient presents for a general check up and refills on his medications. He has not been taking his HTN, or cholesterol medications for the past 3 months. His only medication is a daily insulin shot of 10 or 15 units. 1. Diabetes Type II- uncontrolled HbA1c >14 - [employer] monitor - Glucose test strips - Metformin 1000mg  PO 1 pill BID - Insulin Glargine (Lantus) 15 units at bedtime - check urine for microalbuminuria  - CMP with GFR to assess renal function - refer to ophthalmology for routine visit with Dr. Heather Burundi - Diabetic teaching - Foot exam - one 10ml vile of Lantus given to patient - sent prescriptions to health department - MAP program  2. Hyperlipidemia - Check lipid profile. Last results are from February 2013 - Refill Pravastatin 40mg  PO daily  3. Health Maintenance - Flu Shot  Follow up in December  to assess glucose control. Follow up in 3 months to recheck HbA1c.

## 2011-12-26 ENCOUNTER — Ambulatory Visit: Payer: Self-pay | Admitting: Internal Medicine

## 2012-02-21 ENCOUNTER — Other Ambulatory Visit: Payer: Self-pay | Admitting: *Deleted

## 2012-02-22 NOTE — Telephone Encounter (Signed)
review 

## 2012-07-15 ENCOUNTER — Ambulatory Visit (INDEPENDENT_AMBULATORY_CARE_PROVIDER_SITE_OTHER): Payer: Self-pay | Admitting: Internal Medicine

## 2012-07-15 ENCOUNTER — Encounter: Payer: Self-pay | Admitting: Internal Medicine

## 2012-07-15 VITALS — BP 127/73 | HR 85 | Temp 99.5°F | Ht 74.0 in | Wt 206.7 lb

## 2012-07-15 DIAGNOSIS — E119 Type 2 diabetes mellitus without complications: Secondary | ICD-10-CM

## 2012-07-15 DIAGNOSIS — Z0000 Encounter for general adult medical examination without abnormal findings: Secondary | ICD-10-CM

## 2012-07-15 DIAGNOSIS — E785 Hyperlipidemia, unspecified: Secondary | ICD-10-CM

## 2012-07-15 LAB — GLUCOSE, CAPILLARY: Glucose-Capillary: 530 mg/dL — ABNORMAL HIGH (ref 70–99)

## 2012-07-15 MED ORDER — PRAVASTATIN SODIUM 40 MG PO TABS: 40.0000 mg | ORAL_TABLET | Freq: Every day | ORAL | Status: DC

## 2012-07-15 MED ORDER — INSULIN GLARGINE 100 UNIT/ML ~~LOC~~ SOLN: SUBCUTANEOUS | Status: DC

## 2012-07-15 MED ORDER — METFORMIN HCL 1000 MG PO TABS: 1000.0000 mg | ORAL_TABLET | Freq: Two times a day (BID) | ORAL | Status: DC

## 2012-07-15 NOTE — Assessment & Plan Note (Signed)
He has his own ophthalmologist. He promised to call for eye examination appointment.

## 2012-07-15 NOTE — Patient Instructions (Addendum)
1. Your blood sugar is still very poorly controlled. In order to prevent long term complications, you should start taking your medications regularly. You should take Lantus insulin 15 Units at bedtime. You will be getting your prescriptions through the health department. For the Lantus, you will need to sign up for the MAP program. Please do that tomorrow.   2. If you have worsening of your symptoms or new symptoms arise, please call the clinic [phone]), or go to the ER immediately if symptoms are severe.

## 2012-07-15 NOTE — Progress Notes (Signed)
Patient ID: [name], male   DOB: [birth year], 47 y.o.   MRN: [id number] Subjective:   Patient ID: [name] male   DOB: 13-Mar-1965 47 y.o.   MRN: [id number]  CC:  follow up visit.   HPI:  Patient is 47 years old male here with his wife for a follow up visit for his DM-II.    1. DM-II: patient's DM-II has been very poor controlled in the past mainly because of medication noncompliance. His A1C was >14 in his previous visit on 12/25/2011. He was put on Lantuss 15 units QHS and metformin 1000 mg bid. He states that he has been taking his insulin until 1.5 month ago. He stopped taking insulin for no reason per patient. He said that he dose no have financial difficulty to get medication. Today, he comes to the clinic and wants to get his insulin refill prescriptions. He denies any symptoms and states that he feel fine. Today his CBG is >500 and A1c is >14.  2. HLD: his LDL was 145 on 12/25/11. He was supposed to take pravastatin 40 mg daily. But he has not been taking it for long time.  ROS: Denies fever, chills, fatigue, headaches,  cough, chest pain, SOB,  abdominal pain,diarrhea, constipation, dysuria, urgency, frequency, hematuria, joint pain or leg swelling.   Past Medical History  Diagnosis Date  . Diabetes mellitus   . Hyperlipidemia   . Arthritis     left knee   Current Outpatient Prescriptions  Medication Sig Dispense Refill  . insulin glargine (LANTUS) 100 UNIT/ML injection Inject 15 units of insulin into the skin at bedtime  10 mL  3  . Insulin Syringe-Needle U-100 (INSULIN SYRINGE .5CC/31GX5/16") 31G X 5/16" 0.5 ML MISC 1 each by Does not apply route daily. Use as instructed  100 each  3  . metFORMIN (GLUCOPHAGE) 1000 MG tablet Take 1 tablet (1,000 mg total) by mouth 2 (two) times daily with a meal.  60 tablet  11  . pravastatin (PRAVACHOL) 40 MG tablet Take 1 tablet (40 mg total) by mouth daily.  30 tablet  6   No current facility-administered medications for this visit.   No  family history on file. History   Social History  . Marital Status: Married    Spouse Name: N/A    Number of Children: N/A  . Years of Education: N/A   Social History Main Topics  . Smoking status: Never Smoker   . Smokeless tobacco: None  . Alcohol Use: No  . Drug Use: No  . Sexually Active: None     Comment: immigrated from [place], speaks arabic, married   Other Topics Concern  . None   Social History Narrative  . None    Review of Systems: Full 14-point review of systems otherwise negative. See HPI.   Objective:  Physical Exam: Filed Vitals:   07/15/12 1554  BP: 127/73  Pulse: 85  Temp: 99.5 F (37.5 C)  TempSrc: Oral  Height: 6\' 2"  (1.88 m)  Weight: 206 lb 11.2 oz (93.759 kg)  SpO2: 99%   General: resting in bed, not in acute distress HEENT: PERRL, EOMI, no scleral icterus Cardiac: S1/S2, RRR, No murmurs, gallops or rubs Pulm: Good air movement bilaterally, Clear to auscultation bilaterally, No rales, wheezing, rhonchi or rubs. Abd: Soft,  nondistended, nontender, no rebound pain, no organomegaly, BS present Ext: No rashes or edema, 2+DP/PT pulse bilaterally Neuro: alert and oriented X3, cranial nerves II-XII grossly intact, muscle strength 5/5 in all  extremeties,  sensation to light touch intact.   .   Assessment & Plan:

## 2012-07-15 NOTE — Assessment & Plan Note (Signed)
His last LDL was 145 on 12/25/11. He is not taking his mediation. Will restart his pravastatin 40 mg daily today.

## 2012-07-15 NOTE — Assessment & Plan Note (Signed)
Lab Results  Component Value Date   HGBA1C >14.0 07/15/2012   HGBA1C >14.0 12/25/2011   HGBA1C >14.0 03/13/2011     Assessment: Diabetes control: poor control (HgbA1C >9%) Progress toward A1C goal:  unchanged Comments:   Plan: Medications:  continue current medications Home glucose monitoring: not done Frequency:   Timing:   Instruction/counseling given: reminded to get eye exam Educational resources provided: brochure;referral to diabetes education class Self management tools provided:   Other plans: patient's DM-II is very poorly controled mainly because of medication noncompliance. Will restart his insulin 15 U QHS. patient is provided with one bottle of Lantus sample today to bridge him to get his own insulin. Will give him referral to diabetic educator. Patient will continue his metformin 1000 mg bid. Will see him in one month.

## 2012-07-16 NOTE — Progress Notes (Signed)
Case discussed with Dr. Niu soon after the resident saw the patient.  We reviewed the resident's history and exam and pertinent patient test results.  I agree with the assessment, diagnosis and plan of care documented in the resident's note. 

## 2012-08-07 ENCOUNTER — Other Ambulatory Visit: Payer: Self-pay

## 2012-08-12 ENCOUNTER — Encounter: Payer: Self-pay | Admitting: Internal Medicine

## 2012-08-12 ENCOUNTER — Encounter: Payer: Self-pay | Admitting: Dietician

## 2012-08-15 NOTE — Addendum Note (Signed)
Addended by: Neomia Dear on: 08/15/2012 06:10 PM   Modules accepted: Orders

## 2012-09-17 ENCOUNTER — Ambulatory Visit: Payer: Self-pay

## 2013-02-03 ENCOUNTER — Ambulatory Visit: Payer: Self-pay

## 2013-02-05 ENCOUNTER — Encounter: Payer: Self-pay | Admitting: Internal Medicine

## 2013-02-05 ENCOUNTER — Ambulatory Visit: Payer: Self-pay | Admitting: Internal Medicine

## 2013-02-11 ENCOUNTER — Ambulatory Visit (INDEPENDENT_AMBULATORY_CARE_PROVIDER_SITE_OTHER): Payer: No Typology Code available for payment source | Admitting: Internal Medicine

## 2013-02-11 ENCOUNTER — Encounter: Payer: Self-pay | Admitting: Internal Medicine

## 2013-02-11 VITALS — BP 122/77 | HR 85 | Temp 97.3°F | Ht 74.0 in | Wt 192.4 lb

## 2013-02-11 DIAGNOSIS — E785 Hyperlipidemia, unspecified: Secondary | ICD-10-CM

## 2013-02-11 DIAGNOSIS — E119 Type 2 diabetes mellitus without complications: Secondary | ICD-10-CM

## 2013-02-11 LAB — LIPID PANEL
CHOL/HDL RATIO: 4.6 ratio
Cholesterol: 183 mg/dL (ref 0–200)
HDL: 40 mg/dL (ref >39)
LDL CALC: 97 mg/dL (ref 0–99)
Triglycerides: 231 mg/dL — ABNORMAL HIGH (ref <150)
VLDL: 46 mg/dL — AB (ref 0–40)

## 2013-02-11 LAB — POCT GLYCOSYLATED HEMOGLOBIN (HGB A1C): Hemoglobin A1C: >14

## 2013-02-11 LAB — HM DIABETES EYE EXAM

## 2013-02-11 LAB — GLUCOSE, CAPILLARY: Glucose-Capillary: 345 mg/dL — ABNORMAL HIGH (ref 70–99)

## 2013-02-11 MED ORDER — PRAVASTATIN SODIUM 40 MG PO TABS: 40.0000 mg | ORAL_TABLET | Freq: Every day | ORAL | Status: DC

## 2013-02-11 MED ORDER — GLUCOSE BLOOD VI STRP: ORAL_STRIP | Status: DC

## 2013-02-11 MED ORDER — ACCU-CHEK SOFTCLIX LANCET DEV MISC: Status: DC

## 2013-02-11 MED ORDER — ACCU-CHEK AVIVA PLUS W/DEVICE KIT: 1.0000 | PACK | Status: DC

## 2013-02-11 MED ORDER — METFORMIN HCL 1000 MG PO TABS: 1000.0000 mg | ORAL_TABLET | Freq: Two times a day (BID) | ORAL | Status: DC

## 2013-02-11 MED ORDER — "INSULIN SYRINGE 31G X 5/16"" 0.5 ML MISC": 1.0000 | Freq: Every day | Status: DC

## 2013-02-11 MED ORDER — INSULIN NPH ISOPHANE & REGULAR (70-30) 100 UNIT/ML ~~LOC~~ SUSP: SUBCUTANEOUS | Status: DC

## 2013-02-11 NOTE — Assessment & Plan Note (Addendum)
Lab Results  Component Value Date   HGBA1C >14.0 02/11/2013   HGBA1C >14.0 07/15/2012   HGBA1C >14.0 12/25/2011     Assessment: Diabetes control: poor control (HgbA1C >9%) Progress toward A1C goal:  unchanged Comments: Pt without insulin x4 mo. He states that he is taking his Metformin BID.   Plan: Medications:  Continue the Metformin and will switch insulin to Novolin 70/30 15 units before his largest meal of the day. He is to check his blood sugars at least once a day. He has been givne a prescription for a new meter, test strips, and lancets and has been instructed to [facility] these [facility]. I spoke with our diabetes educator, Debera Lat, indepth regarding the patient, and this is the plan we decided on for the patient. He is to return to the clinic in 1 month.   Home glucose monitoring: Frequency: no home glucose monitoring Timing:   Instruction/counseling given: reminded to get eye exam and reminded to bring blood glucose meter & log to each visit Educational resources provided: brochure Self management tools provided: other (see comments) (needs a new meter) Other plans: F/u in 1 month. Also checking lipid panel and microalbumin/Cr

## 2013-02-11 NOTE — Assessment & Plan Note (Signed)
Checking lipid panel. Refilling Pravastatin today- pt to take prescription to the [facility].

## 2013-02-11 NOTE — Progress Notes (Signed)
Patient ID: [name], male   DOB: 1965/09/05, 48 y.o.   MRN: [id number]  Subjective:   Patient ID: [name] male   DOB: [birth year] 48 y.o.   MRN: [id number]  HPI: [name] is a 48 y.o. M w/ PMH uncontrolled DM2 and HLD presents today for a f/u for his DM and HLD.   He was last seen on 07/15/12 and was asked to return in 1 month, but he has not been back to the clinic until today. At that visit his A1c was >14, and he was noncompliant with his insulin regimen of Lantus 15u qhs and with his Metformin 1000mg  BID. He was to be restarted on the Lantus and Metformin at that time. He states that he was using the Lantus qhs but ran out about 4 months ago. He has not been checking his blood sugars b/c he does not have one or have the test strips.   He had also states that he would have an eye exam by his own eye doctor, but this has not been done. He agrees to a retinal exam today.   He denies fevers, chills, changes in appetite, abdominal pain, SOB, abdominal pain, myalgias, arthralgias, edema, headaches, or weakness.     Past Medical History  Diagnosis Date  . Diabetes mellitus   . Hyperlipidemia   . Arthritis     left knee   Current Outpatient Prescriptions  Medication Sig Dispense Refill  . insulin glargine (LANTUS) 100 UNIT/ML injection Inject 15 units of insulin into the skin at bedtime  10 mL  3  . Insulin Syringe-Needle U-100 (INSULIN SYRINGE .5CC/31GX5/16") 31G X 5/16" 0.5 ML MISC 1 each by Does not apply route daily. Use as instructed  100 each  3  . metFORMIN (GLUCOPHAGE) 1000 MG tablet Take 1 tablet (1,000 mg total) by mouth 2 (two) times daily with a meal.  60 tablet  11  . pravastatin (PRAVACHOL) 40 MG tablet Take 1 tablet (40 mg total) by mouth daily.  30 tablet  6   No current facility-administered medications for this visit.   No family history on file. History   Social History  . Marital Status: Married    Spouse Name: N/A    Number of Children: N/A  . Years of  Education: N/A   Social History Main Topics  . Smoking status: Never Smoker   . Smokeless tobacco: None  . Alcohol Use: No  . Drug Use: No  . Sexual Activity: None     Comment: immigrated from [place], speaks arabic, married   Other Topics Concern  . None   Social History Narrative  . None   Review of Systems: A 12 point ROS was performed; pertinent positives and negatives were noted in the HPI  Objective:  Physical Exam: Filed Vitals:   02/11/13 1521  BP: 122/77  Pulse: 85  Temp: 97.3 F (36.3 C)  TempSrc: Oral  Height: 6\' 2"  (1.88 m)  Weight: 192 lb 6.4 oz (87.272 kg)  SpO2: 98%   Constitutional: Vital signs reviewed.  Patient is a well-developed and well-nourished male in no acute distress and cooperative with exam.  Head: Normocephalic and atraumatic Eyes: PERRL, EOMI. No scleral icterus.  Cardiovascular: RRR, no MRG Pulmonary/Chest: Normal respiratory effort, CTAB, no wheezes, rales, or rhonchi Abdominal: Soft. Non-tender, non-distended, bowel sounds are normal, no masses, organomegaly, or guarding present.  Musculoskeletal: No joint deformities, erythema, or stiffness, ROM full and no nontender Neurological: A&O x3, Strength is normal and  symmetric bilaterally, cranial nerve II-XII are grossly intact, no focal motor deficit, sensory intact to light touch bilaterally.  Skin: Warm, dry and intact.  Psychiatric: Normal mood and affect. Speech and behavior is normal.   Assessment & Plan:   Please refer to Problem List based Assessment and Plan

## 2013-02-12 LAB — MICROALBUMIN / CREATININE URINE RATIO
Creatinine, Urine: 48.9 mg/dL
MICROALB UR: 0.68 mg/dL (ref 0.00–1.89)
Microalb Creat Ratio: 13.9 mg/g (ref 0.0–30.0)

## 2013-02-22 NOTE — Progress Notes (Signed)
Case discussed with Dr. Eulas Post soon after the resident saw the patient.  We reviewed the resident's history and exam and pertinent patient test results.  I agree with the assessment, diagnosis, and plan of care documented in the resident's note.  [name] does not have the body habitus of a typical type 2 diabetic as he is not overweight.  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  This set of diabetics is more prone to develop DKA than typical type 2 diabetics.  Interestingly, [name] has not developed DKA despite being out of insulin for nearly 4 months.  Could his Hgb A1C be inaccurate as he may have an unusual Hemoglobin?  Obtaining fasting blood sugar levels will go a long way in sorting this out.  Therefore, I agree with Dr. Roda Shutters assessment and plan.

## 2013-03-02 NOTE — Addendum Note (Signed)
Addended by: Truddie Crumble on: 03/02/2013 11:26 AM   Modules accepted: Orders

## 2013-03-17 ENCOUNTER — Encounter: Payer: No Typology Code available for payment source | Admitting: Internal Medicine

## 2013-03-20 ENCOUNTER — Encounter: Payer: Self-pay | Admitting: Internal Medicine

## 2013-03-20 ENCOUNTER — Ambulatory Visit (INDEPENDENT_AMBULATORY_CARE_PROVIDER_SITE_OTHER): Payer: No Typology Code available for payment source | Admitting: Internal Medicine

## 2013-03-20 ENCOUNTER — Telehealth: Payer: Self-pay | Admitting: *Deleted

## 2013-03-20 VITALS — BP 98/68 | HR 61 | Temp 97.8°F | Ht 74.0 in | Wt 192.9 lb

## 2013-03-20 DIAGNOSIS — A088 Other specified intestinal infections: Secondary | ICD-10-CM

## 2013-03-20 DIAGNOSIS — A084 Viral intestinal infection, unspecified: Secondary | ICD-10-CM

## 2013-03-20 DIAGNOSIS — R109 Unspecified abdominal pain: Secondary | ICD-10-CM | POA: Insufficient documentation

## 2013-03-20 DIAGNOSIS — E119 Type 2 diabetes mellitus without complications: Secondary | ICD-10-CM

## 2013-03-20 LAB — COMPLETE METABOLIC PANEL WITH GFR
ALT: 30 U/L (ref 0–53)
AST: 21 U/L (ref 0–37)
Albumin: 3.6 g/dL (ref 3.5–5.2)
Alkaline Phosphatase: 89 U/L (ref 39–117)
BILIRUBIN TOTAL: 0.6 mg/dL (ref 0.3–1.2)
BUN: 12 mg/dL (ref 6–23)
CALCIUM: 9.5 mg/dL (ref 8.4–10.5)
CHLORIDE: 100 meq/L (ref 96–112)
CO2: 30 meq/L (ref 19–32)
CREATININE: 0.95 mg/dL (ref 0.50–1.35)
GLUCOSE: 72 mg/dL (ref 70–99)
Potassium: 4.2 mEq/L (ref 3.5–5.3)
Sodium: 142 mEq/L (ref 135–145)
Total Protein: 7.6 g/dL (ref 6.0–8.3)

## 2013-03-20 LAB — GLUCOSE, CAPILLARY: Glucose-Capillary: 239 mg/dL — ABNORMAL HIGH (ref 70–99)

## 2013-03-20 LAB — CBC
HCT: 40.5 % (ref 39.0–52.0)
HEMOGLOBIN: 14.4 g/dL (ref 13.0–17.0)
MCH: 27.8 pg (ref 26.0–34.0)
MCHC: 35.6 g/dL (ref 30.0–36.0)
MCV: 78.2 fL (ref 78.0–100.0)
PLATELETS: 221 10*3/uL (ref 150–400)
RBC: 5.18 MIL/uL (ref 4.22–5.81)
RDW: 12.3 % (ref 11.5–15.5)
WBC: 4.1 10*3/uL (ref 4.0–10.5)

## 2013-03-20 MED ORDER — ONDANSETRON 4 MG PO TBDP: 4.0000 mg | ORAL_TABLET | Freq: Three times a day (TID) | ORAL | Status: DC | PRN

## 2013-03-20 MED ORDER — LACTINEX PO CHEW: 1.0000 | CHEWABLE_TABLET | Freq: Three times a day (TID) | ORAL | Status: DC

## 2013-03-20 NOTE — Patient Instructions (Addendum)
Please follow-up in one week with Dr. Blaine Hamper so we can check in to be sure you are doing better.   We sent in prescriptions for a nausea medicine called ZOFRAN as well as a probiotic called LACTINEX to help with diarrhea.  These will be waiting on you at [facility] on [address]. The nausea medicine is placed under the tongue and dissolves.  If you have trouble affording these medicines, please call and let us know ([phone]).   Once you are feeling less nauseated, you should be sure to drink plenty of water and Gatorade to avoid dehydration.  You should also take Tylenol 2-3 tabs up to three times daily for headache.   You do not need fluids through the IV today based on your blood pressure in different positions.   DO NOT TAKE YOUR METFORMIN until you follow-up next week.  We are checking a couple of blood tests today.  I will let you know if the results are abnormal.   If you are not feeling better/able to take food and drink by mouth by TOMORROW, you should go to urgent care or the emergency room.   Viral Gastroenteritis Viral gastroenteritis is also known as stomach flu. This condition affects the stomach and intestinal tract. It can cause sudden diarrhea and vomiting. The illness typically lasts 3 to 8 days. Most people develop an immune response that eventually gets rid of the virus. While this natural response develops, the virus can make you quite ill. CAUSES  Many different viruses can cause gastroenteritis, such as rotavirus or noroviruses. You can catch one of these viruses by consuming contaminated food or water. You may also catch a virus by sharing utensils or other personal items with an infected person or by touching a contaminated surface. SYMPTOMS  The most common symptoms are diarrhea and vomiting. These problems can cause a severe loss of body fluids (dehydration) and a body salt (electrolyte) imbalance. Other symptoms may include:  Fever.  Headache.  Fatigue.  Abdominal  pain. DIAGNOSIS  Your caregiver can usually diagnose viral gastroenteritis based on your symptoms and a physical exam. A stool sample may also be taken to test for the presence of viruses or other infections. TREATMENT  This illness typically goes away on its own. Treatments are aimed at rehydration. The most serious cases of viral gastroenteritis involve vomiting so severely that you are not able to keep fluids down. In these cases, fluids must be given through an intravenous line (IV). HOME CARE INSTRUCTIONS   Drink enough fluids to keep your urine clear or pale yellow. Drink small amounts of fluids frequently and increase the amounts as tolerated.  Ask your caregiver for specific rehydration instructions.  Avoid:  Foods high in sugar.  Alcohol.  Carbonated drinks.  Tobacco.  Juice.  Caffeine drinks.  Extremely hot or cold fluids.  Fatty, greasy foods.  Too much intake of anything at one time.  Dairy products until 24 to 48 hours after diarrhea stops.  You may consume probiotics. Probiotics are active cultures of beneficial bacteria. They may lessen the amount and number of diarrheal stools in adults. Probiotics can be found in yogurt with active cultures and in supplements.  Wash your hands well to avoid spreading the virus.  Only take over-the-counter or prescription medicines for pain, discomfort, or fever as directed by your caregiver. Do not give aspirin to children. Antidiarrheal medicines are not recommended.  Ask your caregiver if you should continue to take your regular prescribed and over-the-counter  medicines.  Keep all follow-up appointments as directed by your caregiver. SEEK IMMEDIATE MEDICAL CARE IF:   You are unable to keep fluids down.  You do not urinate at least once every 6 to 8 hours.  You develop shortness of breath.  You notice blood in your stool or vomit. This may look like coffee grounds.  You have abdominal pain that increases or is  concentrated in one small area (localized).  You have persistent vomiting or diarrhea.  You have a fever.  The patient is a child younger than 3 months, and he or she has a fever.  The patient is a child older than 3 months, and he or she has a fever and persistent symptoms.  The patient is a child older than 3 months, and he or she has a fever and symptoms suddenly get worse.  The patient is a baby, and he or she has no tears when crying. MAKE SURE YOU:   Understand these instructions.  Will watch your condition.  Will get help right away if you are not doing well or get worse. Document Released: 01/15/2005 Document Revised: 04/09/2011 Document Reviewed: 11/01/2010 [facility] Patient Information 2014 [employer] [place].  Dehydration, Adult Dehydration is when you lose more fluids from the body than you take in. Vital organs like the kidneys, brain, and heart cannot function without a proper amount of fluids and salt. Any loss of fluids from the body can cause dehydration.  CAUSES  Vomiting. Diarrhea. Excessive sweating. Excessive urine output. Fever. SYMPTOMS  Mild dehydration Thirst. Dry lips. Slightly dry mouth. Moderate dehydration Very dry mouth. Sunken eyes. Skin does not bounce back quickly when lightly pinched and released. Dark urine and decreased urine production. Decreased tear production. Headache. Severe dehydration Very dry mouth. Extreme thirst. Rapid, weak pulse (more than 100 beats per minute at rest). Cold hands and feet. Not able to sweat in spite of heat and temperature. Rapid breathing. Blue lips. Confusion and lethargy. Difficulty being awakened. Minimal urine production. No tears. DIAGNOSIS  Your caregiver will diagnose dehydration based on your symptoms and your exam. Blood and urine tests will help confirm the diagnosis. The diagnostic evaluation should also identify the cause of dehydration. TREATMENT  Treatment of mild or moderate  dehydration can often be done at home by increasing the amount of fluids that you drink. It is best to drink small amounts of fluid more often. Drinking too much at one time can make vomiting worse. Refer to the home care instructions below. Severe dehydration needs to be treated at the hospital where you will probably be given intravenous (IV) fluids that contain water and electrolytes. HOME CARE INSTRUCTIONS  Ask your caregiver about specific rehydration instructions. Drink enough fluids to keep your urine clear or pale yellow. Drink small amounts frequently if you have nausea and vomiting. Eat as you normally do. Avoid: Foods or drinks high in sugar. Carbonated drinks. Juice. Extremely hot or cold fluids. Drinks with caffeine. Fatty, greasy foods. Alcohol. Tobacco. Overeating. Gelatin desserts. Wash your hands well to avoid spreading bacteria and viruses. Only take over-the-counter or prescription medicines for pain, discomfort, or fever as directed by your caregiver. Ask your caregiver if you should continue all prescribed and over-the-counter medicines. Keep all follow-up appointments with your caregiver. SEEK MEDICAL CARE IF: You have abdominal pain and it increases or stays in one area (localizes). You have a rash, stiff neck, or severe headache. You are irritable, sleepy, or difficult to awaken. You are weak, dizzy, or  extremely thirsty. SEEK IMMEDIATE MEDICAL CARE IF:  You are unable to keep fluids down or you get worse despite treatment. You have frequent episodes of vomiting or diarrhea. You have blood or green matter (bile) in your vomit. You have blood in your stool or your stool looks black and tarry. You have not urinated in 6 to 8 hours, or you have only urinated a small amount of very dark urine. You have a fever. You faint. MAKE SURE YOU:  Understand these instructions. Will watch your condition. Will get help right away if you are not doing well or get  worse. Document Released: 01/15/2005 Document Revised: 04/09/2011 Document Reviewed: 09/04/2010 [facility] Patient Information 2014 [place], [place].

## 2013-03-20 NOTE — Telephone Encounter (Signed)
Pt c/o vomiting and diarrhea since last night. C/o abd pain and nausea. Feels weak. Denies fever.  Will see today.

## 2013-03-20 NOTE — Assessment & Plan Note (Signed)
CBG 239 today.  Patient to continue holding metformin in setting of vomiting and diarrhea.  Follow-up with PCP next week.

## 2013-03-20 NOTE — Assessment & Plan Note (Signed)
Patient presents with nausea/vomiting/diarrhea in past 5 days (see HPI for details).  Checked orthostatics in clinic which were negative: lying BP 101/65, HR 75, sitting BP 103/70, HR 73, standing 104/71, HR 83.   CMP, CBC today.   Sent in rx for Zofran ODT, instructed him to take and eat bland foods like crackers or dry toast and water or Gatorade.  Also sent in rx for Lactinex (probiotic) to help with diarrhea.  Counseled patient that if nausea is not controlled with Zofran, and he is unable to keep down PO by tomorrow, he will need to seek further medical attention at urgent care or ED.   He may take Tylenol 2-3 tabs up to three times daily as needed for headache once nausea is controlled.  Instructed patient to continue holding metformin.   Follow-up in 1 week.

## 2013-03-20 NOTE — Progress Notes (Signed)
Patient ID: Trenden Hazelrigg, male   DOB: 1965/05/15, 48 y.o.   MRN: [id number]   Subjective:   Patient ID: Talmage Teaster male   DOB: 07-17-1965 48 y.o.   MRN: [id number]  HPI: Mr.Pasha Strausbaugh is a 48 y.o. man with history of uncontrolled DM2, HL who presents for acute visit for nausa/vomiting/diarrhea.   Patient states he started having nausea/vomiting on Sunday 2/15.  Vomitus has been yellow with food he has eaten in it, no blood.  He is unsure how many times per day he has been vomiting, it is mainly immediately following eating.  He has not had good PO intake especially since yesterday due to nausea.  He also developed diarrhea about 3 days ago and is having ~3 episodes per day.  States that diarrhea is watery and brown, no blood.  After diarrhea started, he has been having abdominal tightness/cramping.  He is sometimes having dizziness with standing, no loss of conciousness.  Denies fever but states that he has had chills at night.  He has also been getting headache after episodes of vomiting.  No chest pain or shortness of breath.  No new or unusual foods (other than liver pudding which he ate yesterday and immediately threw up).  No recent antibiotic use, no sick contacts.  He has not been taking metformin due to vomiting.  CBG 239 in clinic today.    Past Medical History  Diagnosis Date  . Diabetes mellitus   . Hyperlipidemia   . Arthritis     left knee   Current Outpatient Prescriptions  Medication Sig Dispense Refill  . Blood Glucose Monitoring Suppl (ACCU-CHEK AVIVA PLUS) W/DEVICE KIT 1 Device by Does not apply route as directed. Use to test blood glucose 2-3 times daily. Dx: 250.00  1 kit  0  . glucose blood (ACCU-CHEK AVIVA) test strip Use to test blood glucose 2-3 times daily. Dx:250.00  100 each  12  . insulin NPH-regular Human (NOVOLIN 70/30) (70-30) 100 UNIT/ML injection Take 15 units before your largest meal of the day each day  10 mL  6  . Insulin Syringe-Needle U-100 (INSULIN SYRINGE  .5CC/31GX5/16") 31G X 5/16" 0.5 ML MISC 1 each by Does not apply route daily. Use as instructed  100 each  3  . lactobacillus acidophilus & bulgar (LACTINEX) chewable tablet Chew 1 tablet by mouth 3 (three) times daily with meals.  90 tablet  0  . Lancet Devices (ACCU-CHEK SOFTCLIX) lancets Use to test blood glucose 2-3 times daily. Dx: 250.00  1 each  6  . metFORMIN (GLUCOPHAGE) 1000 MG tablet Take 1 tablet (1,000 mg total) by mouth 2 (two) times daily with a meal.  60 tablet  6  . ondansetron (ZOFRAN ODT) 4 MG disintegrating tablet Take 1 tablet (4 mg total) by mouth every 8 (eight) hours as needed for nausea or vomiting.  15 tablet  0  . pravastatin (PRAVACHOL) 40 MG tablet Take 1 tablet (40 mg total) by mouth daily.  30 tablet  6   No current facility-administered medications for this visit.   No family history on file. History   Social History  . Marital Status: Married    Spouse Name: N/A    Number of Children: N/A  . Years of Education: N/A   Social History Main Topics  . Smoking status: Never Smoker   . Smokeless tobacco: None  . Alcohol Use: No  . Drug Use: No  . Sexual Activity: None     Comment: immigrated  from [place], speaks arabic, married   Other Topics Concern  . None   Social History Narrative  . None   Review of Systems: Review of Systems  Constitutional: Positive for chills. Negative for fever.  HENT: Negative for congestion.   Eyes: Negative for blurred vision.  Respiratory: Negative for cough and shortness of breath.   Cardiovascular: Negative for chest pain, palpitations and leg swelling.  Gastrointestinal: Positive for nausea, vomiting, abdominal pain and diarrhea. Negative for constipation and blood in stool.  Genitourinary: Negative for dysuria.  Musculoskeletal: Negative for falls.  Neurological: Positive for dizziness and headaches. Negative for loss of consciousness and weakness.    Objective:  Physical Exam: Filed Vitals:   03/20/13 1431    BP: 98/68  Pulse: 61  Temp: 97.8 F (36.6 C)  TempSrc: Oral  Height: '6\' 2"'  (1.88 m)  Weight: 192 lb 14.4 oz (87.499 kg)  SpO2: 100%   General: alert, cooperative, and in no apparent distress HEENT: NCAT, vision grossly intact, oropharynx clear and non-erythematous  Neck: supple, no lymphadenopathy Lungs: clear to ascultation bilaterally, normal work of respiration, no wheezes, rales, ronchi Heart: regular rate and rhythm, no murmurs, gallops, or rubs Abdomen: mild TTP in bilateral upper quadrants, no epigastric tenderness, soft, non-distended, normal bowel sounds Extremities: 2+ DP/PT pulses bilaterally, no cyanosis, clubbing, or edema Neurologic: alert & oriented X3, cranial nerves II-XII intact, strength grossly intact, sensation intact to light touch  Assessment & Plan:  Patient discussed with Dr. Ellwood Dense. Please see problem-based assessment and plan.

## 2013-03-25 ENCOUNTER — Ambulatory Visit: Payer: No Typology Code available for payment source | Admitting: Internal Medicine

## 2013-03-30 NOTE — Progress Notes (Signed)
Case discussed with Dr. Rogers at the time of the visit.  We reviewed the resident's history and exam and pertinent patient test results.  I agree with the assessment, diagnosis, and plan of care documented in the resident's note. 

## 2013-05-25 ENCOUNTER — Encounter: Payer: Self-pay | Admitting: Internal Medicine

## 2013-05-25 ENCOUNTER — Ambulatory Visit (INDEPENDENT_AMBULATORY_CARE_PROVIDER_SITE_OTHER): Payer: No Typology Code available for payment source | Admitting: Internal Medicine

## 2013-05-25 VITALS — BP 133/83 | HR 66 | Temp 97.0°F | Ht 74.0 in | Wt 199.2 lb

## 2013-05-25 DIAGNOSIS — M25539 Pain in unspecified wrist: Secondary | ICD-10-CM

## 2013-05-25 DIAGNOSIS — M25532 Pain in left wrist: Secondary | ICD-10-CM

## 2013-05-25 DIAGNOSIS — E119 Type 2 diabetes mellitus without complications: Secondary | ICD-10-CM

## 2013-05-25 LAB — GLUCOSE, CAPILLARY: GLUCOSE-CAPILLARY: 364 mg/dL — AB (ref 70–99)

## 2013-05-25 LAB — POCT GLYCOSYLATED HEMOGLOBIN (HGB A1C): HEMOGLOBIN A1C: 12.8

## 2013-05-25 MED ORDER — INSULIN NPH ISOPHANE & REGULAR (70-30) 100 UNIT/ML ~~LOC~~ SUSP
SUBCUTANEOUS | Status: DC
Start: 2013-05-25 — End: 2013-06-29

## 2013-05-25 MED ORDER — INSULIN NPH ISOPHANE & REGULAR (70-30) 100 UNIT/ML ~~LOC~~ SUSP: SUBCUTANEOUS | Status: DC

## 2013-05-25 NOTE — Assessment & Plan Note (Signed)
Poorly controlled with an A1C of 12.8(april 27th, 2015) Pre-lunch, pre-dinner, post-lunch all are high and way above the target range. Discussed with Dr. Stann Mainland regarding further management.  Plans Change Novolin 70/30 once a day to BID with 15 units in AM, 10 units in PM. Continue metformin 1000 BID Follow up in a month for DM. Recommended to check his CBG's four times times daily: before each meal and at bedtime. Opthalmology referral. Advised patient to call the clinic if he notices any blood sugars below 60.

## 2013-05-25 NOTE — Progress Notes (Signed)
Subjective:   Patient ID: [name] male   DOB: 21-Jul-1965 48 y.o.   MRN: [id number]  HPI: [name] is a 48 y.o. with PMH significant for Type 2 DM, HLD comes to the office for DM follow up.  Patient doesn't speak or understand english (speaks arabic) and is in the office with his ?wife and interpreter. The history is obtained with the help of an interpreter.  Patient reports that he is here today for a follow up of his diabetes. He reports that he is checking his blood sugars 1-2 times a day and bring his glucometer today. The downloaded values reveal most of the values after lunch time and some values before lunch time and some before dinner. None of the values are fasting or at night. The pre-lunch values range from 209-396. The post-lunch values range from 260-HI (two values in HI). Pre dinner values are 387, 420. Patient reports that he has been taking 20 units of 70/30 for the two months. He states that he works full time at a [employer].   Patient reports that he has been having blurred vision and wants a referral to opthalmology.   Patient complains of left wrist pain for the last one week and states that he uses his hands and wrist all the time at his work.  He denies any other complaints during this office visit.   Past Medical History  Diagnosis Date  . Diabetes mellitus   . Hyperlipidemia   . Arthritis     left knee   Current Outpatient Prescriptions  Medication Sig Dispense Refill  . Blood Glucose Monitoring Suppl (ACCU-CHEK AVIVA PLUS) W/DEVICE KIT 1 Device by Does not apply route as directed. Use to test blood glucose 2-3 times daily. Dx: 250.00  1 kit  0  . glucose blood (ACCU-CHEK AVIVA) test strip Use to test blood glucose 2-3 times daily. Dx:250.00  100 each  12  . insulin NPH-regular Human (NOVOLIN 70/30) (70-30) 100 UNIT/ML injection Take 15 units before your largest meal of the day each day  10 mL  6  . Insulin Syringe-Needle U-100 (INSULIN SYRINGE  .5CC/31GX5/16") 31G X 5/16" 0.5 ML MISC 1 each by Does not apply route daily. Use as instructed  100 each  3  . lactobacillus acidophilus & bulgar (LACTINEX) chewable tablet Chew 1 tablet by mouth 3 (three) times daily with meals.  90 tablet  0  . Lancet Devices (ACCU-CHEK SOFTCLIX) lancets Use to test blood glucose 2-3 times daily. Dx: 250.00  1 each  6  . metFORMIN (GLUCOPHAGE) 1000 MG tablet Take 1 tablet (1,000 mg total) by mouth 2 (two) times daily with a meal.  60 tablet  6  . ondansetron (ZOFRAN ODT) 4 MG disintegrating tablet Take 1 tablet (4 mg total) by mouth every 8 (eight) hours as needed for nausea or vomiting.  15 tablet  0  . pravastatin (PRAVACHOL) 40 MG tablet Take 1 tablet (40 mg total) by mouth daily.  30 tablet  6   No current facility-administered medications for this visit.   No family history on file. History   Social History  . Marital Status: Married    Spouse Name: N/A    Number of Children: N/A  . Years of Education: N/A   Social History Main Topics  . Smoking status: Never Smoker   . Smokeless tobacco: None  . Alcohol Use: No  . Drug Use: No  . Sexual Activity: None     Comment:  immigrated from [place], speaks arabic, married   Other Topics Concern  . None   Social History Narrative  . None   Review of Systems: Pertinent items are noted in HPI. Objective:  Physical Exam: Filed Vitals:   05/25/13 0817  BP: 133/83  Pulse: 66  Temp: 97 F (36.1 C)  TempSrc: Oral  Height: '6\' 2"'  (1.88 m)  Weight: 199 lb 3.2 oz (90.357 kg)  SpO2: 100%   Constitutional: Vital signs reviewed.  Patient is a well-developed and well-nourished and is in no acute distress and cooperative with exam. Alert and oriented x3.  Head: Normocephalic and atraumatic Eyes: PERRL, EOMI, conjunctivae normal, No scleral icterus.  Neck: Supple, Trachea midline normal ROM, No JVD, mass, thyromegaly, or carotid bruit present.  Cardiovascular: RRR, S1 normal, S2 normal, no MRG, pulses  symmetric and intact bilaterally Pulmonary/Chest: normal respiratory effort, CTAB, no wheezes, rales, or rhonchi Abdominal: Soft. Non-tender, non-distended, bowel sounds are normal, no masses, organomegaly, or guarding present.  Musculoskeletal: No swelling noted at the left wrist. No tenderness to palpation. Full active ROM possible at the left wrist with slight pain upon flexion. Neurological: A&O x3, Strength is normal and symmetric bilaterally, cranial nerve II-XII are grossly intact, no focal motor deficit, sensory intact to light touch bilaterally.  Skin: Warm, dry and intact. No rash, cyanosis, or clubbing.  Psychiatric: Normal mood and affect. speech and behavior is normal. Judgment and thought content normal. Cognition and memory are normal.    Assessment & Plan:

## 2013-05-25 NOTE — Assessment & Plan Note (Signed)
Left wrist pain, with no swelling or joint tenderness on exam. Non-traumatic, and appears to be work related over use.  Plans: Conservative management: NSAID therapy for 2 weeks, ice packs, and wrist braces while at work. If symptoms persist in 4 weeks, will consider Xray studies.

## 2013-05-25 NOTE — Patient Instructions (Addendum)
Take Insulin 15 units in the morning and 10 units in the evening. Take it 15 minutes before breakfast and dinner. Take all the other medications as advised below. Use wrist braces while working. Follow up with Opthalmology as recommended. Follow up in a month. Check your blood sugars four times daily: before each meal and at bed time. Bring your glucometer to each office visit.

## 2013-05-26 NOTE — Progress Notes (Signed)
Case discussed with Dr. Eyvonne Mechanic.  Agree with management.

## 2013-06-29 ENCOUNTER — Encounter: Payer: Self-pay | Admitting: Internal Medicine

## 2013-06-29 ENCOUNTER — Ambulatory Visit (INDEPENDENT_AMBULATORY_CARE_PROVIDER_SITE_OTHER): Payer: No Typology Code available for payment source | Admitting: Internal Medicine

## 2013-06-29 VITALS — BP 113/75 | HR 70 | Temp 97.2°F | Ht 74.0 in | Wt 208.0 lb

## 2013-06-29 DIAGNOSIS — E785 Hyperlipidemia, unspecified: Secondary | ICD-10-CM

## 2013-06-29 DIAGNOSIS — E119 Type 2 diabetes mellitus without complications: Secondary | ICD-10-CM

## 2013-06-29 LAB — GLUCOSE, CAPILLARY: Glucose-Capillary: 127 mg/dL — ABNORMAL HIGH (ref 70–99)

## 2013-06-29 MED ORDER — GLUCOSE BLOOD VI STRP: ORAL_STRIP | Status: DC

## 2013-06-29 MED ORDER — METFORMIN HCL 1000 MG PO TABS: 1000.0000 mg | ORAL_TABLET | Freq: Two times a day (BID) | ORAL | Status: DC

## 2013-06-29 MED ORDER — ACCU-CHEK SOFTCLIX LANCET DEV MISC
Status: DC
Start: 2013-06-29 — End: 2015-03-21

## 2013-06-29 MED ORDER — PRAVASTATIN SODIUM 40 MG PO TABS: 40.0000 mg | ORAL_TABLET | Freq: Every day | ORAL | Status: DC

## 2013-06-29 MED ORDER — INSULIN NPH ISOPHANE & REGULAR (70-30) 100 UNIT/ML ~~LOC~~ SUSP: SUBCUTANEOUS | Status: DC

## 2013-06-29 MED ORDER — "INSULIN SYRINGE 31G X 5/16"" 0.5 ML MISC": 1.0000 | Freq: Every day | Status: DC

## 2013-06-29 NOTE — Progress Notes (Signed)
Subjective:   Patient ID: [name] male   DOB: 1965/04/29 48 y.o.   MRN: [id number]  HPI: [name] States is a 48 y.o. [place] male with poorly controlled DM2 who presents to [facility] today for routine diabetic follow up. He brought his meter in today and values have to be adjusted given his work schedule of night shift. Therefore, it appears his night and breakfast values are during and after work and lunch values are before going to work. He reports occasionally feeling shaky and low blood sugar while at work but has not checked his blood sugar at that time, instead he has just corrected it with soda or food and candy.  He reports improved glycemic control and denies polyuria or polydipsia since increasing his insulin 70/30 to 30 units in AM and 20 units in PM.  This is different from what is documented in epic from his last visit with Dr. Eyvonne Mechanic who instructed insulin to be 15 units in AM and 10 units in PM, however, both patient, daughter, and translator claim on that visit the insulin was increased to 30 and 20 units which he has been taking.  Given his work schedule and after reviewing values and times with CDE today, we will ask for blood sugar checks around 3-4pm (before going to work), between 10pm-midnight (while at work during break), and 3-4am (after coming home from work before sleeping) to have more accurate record of values and what his blood sugars do at work. He has been counseled on warning signs of hypoglycemia and how to correct and will need to call us to let us know if that is happening. We will also change insulin to 30 units before work in the afternoon and 20 units after work before sleep for improved control. He will need recheck A1C next month.   The complication to his insulin regimen and glycemic control will be with the start of Ramadan later this month where he will be fasting for most of the day, therefore, we will likely have to cut back on the insulin significantly but we need  better idea of values to do so. We will get CDE to follow up on the phone as well, since he lives far away to keep coming for visits.   Otherwise, his only other complaint is a mild callus on the bottom of his foot from wearing boots at work. We discussed getting insoles for shoes and thicker socks for now as supportive measures. He reports mild tingling in extremities once in a while which will hopefully improved with improvement in his diabetes. He is complaint with metformin. Medications were refilled today as well.   Past Medical History  Diagnosis Date  . Diabetes mellitus   . Hyperlipidemia   . Arthritis     left knee   Current Outpatient Prescriptions  Medication Sig Dispense Refill  . Blood Glucose Monitoring Suppl (ACCU-CHEK AVIVA PLUS) W/DEVICE KIT 1 Device by Does not apply route as directed. Use to test blood glucose 2-3 times daily. Dx: 250.00  1 kit  0  . glucose blood (ACCU-CHEK AVIVA) test strip Use to test blood glucose 2-3 times daily. Dx:250.00  100 each  12  . Insulin Syringe-Needle U-100 (INSULIN SYRINGE .5CC/31GX5/16") 31G X 5/16" 0.5 ML MISC 1 each by Does not apply route daily. Use as instructed  100 each  3  . Lancet Devices (ACCU-CHEK SOFTCLIX) lancets Use to test blood glucose 2-3 times daily. Dx: 250.00  1 each  6  .  metFORMIN (GLUCOPHAGE) 1000 MG tablet Take 1 tablet (1,000 mg total) by mouth 2 (two) times daily with a meal.  60 tablet  6  . insulin NPH-regular Human (NOVOLIN 70/30) (70-30) 100 UNIT/ML injection Take 30 units before work and take 20 units after work before sleeping.  10 mL  6  . pravastatin (PRAVACHOL) 40 MG tablet Take 1 tablet (40 mg total) by mouth daily.  30 tablet  6   No current facility-administered medications for this visit.   No family history on file. History   Social History  . Marital Status: Married    Spouse Name: N/A    Number of Children: N/A  . Years of Education: N/A   Social History Main Topics  . Smoking status: Never  Smoker   . Smokeless tobacco: None  . Alcohol Use: No  . Drug Use: No  . Sexual Activity: None     Comment: immigrated from [place], speaks arabic, married   Other Topics Concern  . None   Social History Narrative  . None   Review of Systems:  Constitutional:  Denies fever, chills, diaphoresis, appetite change and fatigue.   HEENT:  Denies congestion  Respiratory:  Denies SOB, DOE, cough, and wheezing.   Cardiovascular:  Denies chest pain, palpitations, and leg swelling.   Gastrointestinal:  Denies nausea, vomiting, abdominal pain  Genitourinary:  Denies polyuria  Musculoskeletal:  Right foot pain occasionally on sole   Skin:  Denies pallor, rash and wound.   Neurological:  Denies syncope   Objective:  Physical Exam: Filed Vitals:   06/29/13 0919  BP: 113/75  Pulse: 70  Temp: 97.2 F (36.2 C)  TempSrc: Oral  Height: _0  (1.88 m)  Weight: 208 lb (94.348 kg)  SpO2: 100%   Vitals reviewed. General: sitting on examination table, NAD HEENT: EOMI Cardiac: RRR, no rubs, murmurs or gallops Pulm: clear to auscultation bilaterally, no wheezes, rales, or rhonchi Abd: soft, nontender, nondistended, BS present Ext: warm and well perfused, no pedal edema Neuro: alert and oriented X3, strength and sensation to light touch equal in bilateral upper and lower extremities  Assessment & Plan:  Discussed with Dr. Ellwood Dense 30 units on 70/30 insulin before work and 20 units after work before sleep Hypoglycemic counseling CDE to call for follow up Adjust insulin for ramadan later this month TID CBG monitoring, 3-4am, 10pm-midnight, and 3-4pm

## 2013-06-29 NOTE — Assessment & Plan Note (Signed)
Refilled statin

## 2013-06-29 NOTE — Assessment & Plan Note (Addendum)
Lab Results  Component Value Date   HGBA1C 12.8 05/25/2013   HGBA1C >14.0 02/11/2013   HGBA1C >14.0 07/15/2012    Assessment: Diabetes control: poor control (HgbA1C >9%) Progress toward A1C goal:  unable to assess Comments: recheck next month, CBGs variable due to work schedule, seems higher after work, does report feeling low at work  Plan: Medications:  has been taking 30 units in AM and 20 units in PM of 70/30 since April visit. adjusted new insulin regimen to 30 units before work and 20 units after work Home glucose monitoring: Frequency: 3 times a day Timing: before meals (check before work, midnight, and after work before sleeping) Instruction/counseling given: reminded to bring blood glucose meter & log to each visit, reminded to bring medications to each visit, discussed foot care and discussed diet Educational resources provided:   Self management tools provided:   Other plans: need accurate cbg monitoring: 3-4am after work, 3-4pm before work, and 10pm-midnight during work break.   RAMADAN COMING UP LATER THIS MONTH, WILL NEED TO ADJUST INSULIN BASED ON FASTING AND GLUCOSE REQUIREMENT. Will have CDE follow up on phone as well given he lives far and cannot come to the office frequently and with work schedule  Counseled on hypoglycemia and noted to call the office if CBGs running low and how to correct.

## 2013-06-29 NOTE — Patient Instructions (Signed)
Dear Mr. Alene Mires,  Treatment Goals: We need to keep working on your diabetes.   Please check your blood sugar before work (3-4pm every day) before eating, then midnight every day when working, and then when you are home from work before eating and sleeping 3-4am. This way I can have a better idea of your blood sugars.   Please take 30 units of insulin before you go to work and then take 20 units when you are home from work.   Please continue your metformin and cut down on your rice and bread intake  If your blood sugars are low <70 and you are feeling shaky, or dizzy, or light-headed, do not take insulin and call our office right away [phone] and correct your blood sugar by drinking juice or glucose tablet or candy and recheck your blood sugar         .           ( 3-4pm   )                      3-4am .           .    30           20       .                    70                     [id number]                     Goals (1 Years of Data) as of 06/29/13         As of Today 05/25/13 03/20/13 02/11/13 07/15/12     Blood Pressure    . Blood Pressure < 140/90  113/75 133/83 98/68 122/77 127/73     Result Component    . HEMOGLOBIN A1C < 7.0   12.8  >14.0 >14.0    . LDL CALC < 100     97       Progress Toward Treatment Goals:  Treatment Goal 06/29/2013  Hemoglobin A1C unable to assess    Self Care Goals & Plans:  Self Care Goal 06/29/2013  Manage my medications take my medicines as prescribed; bring my  medications to every visit  Monitor my health -  Eat healthy foods -    Home Blood Glucose Monitoring 06/29/2013  Check my blood sugar 3 times a day  When to check my blood sugar before meals    Care Management & Community Referrals:  No flowsheet data found.   Low Blood Sugar Low blood sugar (hypoglycemia) means that the level of sugar in your blood is lower than it should be. Signs of low blood sugar include:  Getting sweaty.  Feeling hungry.  Feeling dizzy or weak.  Feeling sleepier than normal.  Feeling nervous.  Headaches.  Having a fast heartbeat. Low blood sugar can happen fast and can be an emergency. Your doctor can do tests to check your blood sugar level. You can have low blood sugar and not have diabetes. HOME CARE  Check your blood sugar as told by your doctor. If it is less than 70 mg/dl or as told by your doctor, take 1 of the following:  3 to 4 glucose tablets.   cup clear juice.   cup soda pop, not diet.  1 cup milk.  5 to 6 hard candies.  Recheck blood sugar after 15 minutes. Repeat until it is at the right level.  Eat a snack if it is more than 1 hour until the next meal.  Only take medicine as told by your doctor.  Do not skip meals. Eat on time.  Do not drink alcohol except with meals.  Check your blood glucose before driving.  Check your blood glucose before and after exercise.  Always carry treatment with you, such as glucose pills.  Always wear a medical alert bracelet if you have diabetes. GET HELP RIGHT AWAY IF:   Your blood glucose goes below 70 mg/dl or as told by your doctor, and you:  Are confused.  Are not able to swallow.  Pass out (faint).  You cannot treat yourself. You may need someone to help you.  You have low blood sugar problems often.  You have problems from your medicines.  You are not feeling better after 3 to 4 days.  You have vision changes. MAKE SURE YOU:   Understand these  instructions.  Will watch this condition.  Will get help right away if you are not doing well or get worse. Document Released: 04/11/2009 Document Revised: 04/09/2011 Document Reviewed: 04/11/2009 [facility] Patient Information 2014 [place], [place].

## 2013-07-01 NOTE — Progress Notes (Signed)
Case discussed with Dr. Qureshi at the time of the visit.  We reviewed the resident's history and exam and pertinent patient test results.  I agree with the assessment, diagnosis, and plan of care documented in the resident's note. 

## 2013-07-07 ENCOUNTER — Telehealth: Payer: Self-pay | Admitting: Dietician

## 2013-07-07 NOTE — Telephone Encounter (Signed)
Asked to call patient to check on his insulin doses and blood sugars:daughter says patient needs a translator, Called back using [employer]. They left a message in Arabic about the nature of the call and asking for pt to call back at his convenience.

## 2013-07-13 ENCOUNTER — Other Ambulatory Visit: Payer: Self-pay | Admitting: Internal Medicine

## 2013-07-13 ENCOUNTER — Telehealth: Payer: Self-pay | Admitting: *Deleted

## 2013-07-13 DIAGNOSIS — E119 Type 2 diabetes mellitus without complications: Secondary | ICD-10-CM

## 2013-07-13 MED ORDER — INSULIN NPH ISOPHANE & REGULAR (70-30) 100 UNIT/ML ~~LOC~~ SUSP: SUBCUTANEOUS | Status: DC

## 2013-07-13 NOTE — Telephone Encounter (Signed)
Fax from [facility] - states pt has been using Humulin 70/30; is it ok to change back to Humulin?  Talked to [facility] who said it ok; 30 mins ac meals.

## 2013-07-13 NOTE — Telephone Encounter (Signed)
Fax from [facility] - requesting a separate rx , with  comment stating " OK to use Humulin 70/30 during Ramadan".

## 2013-07-13 NOTE — Telephone Encounter (Signed)
Please discuss with pcp who needs to change his regimen due to fasting starting

## 2013-07-13 NOTE — Telephone Encounter (Signed)
Spoke with Dr. Blaine Hamper about patient's insulin dosage for Ramadan. Tried calling him again today twice and left message in Arabic for him to return call and again in [place] stating the reason for the request for a return call.

## 2013-07-13 NOTE — Telephone Encounter (Signed)
Completed by Dr Blaine Hamper.

## 2013-07-14 ENCOUNTER — Encounter: Payer: Self-pay | Admitting: Internal Medicine

## 2013-07-14 ENCOUNTER — Other Ambulatory Visit: Payer: Self-pay | Admitting: Internal Medicine

## 2013-07-14 DIAGNOSIS — E119 Type 2 diabetes mellitus without complications: Secondary | ICD-10-CM

## 2013-07-14 MED ORDER — INSULIN NPH ISOPHANE & REGULAR (70-30) 100 UNIT/ML ~~LOC~~ SUSP: SUBCUTANEOUS | Status: DC

## 2013-07-14 NOTE — Progress Notes (Signed)
Patient ID: [name], male   DOB: 28-Aug-1965, 48 y.o.   MRN: [id number]  Since the patient is going to start Ramadan. His insulin dose needs to be adjusted. I discussed with diabetic educator, [name], we decided to let patient take Humulin 25 U before his largest meal and 15 U before second meal during Ramadan.  I called his pharmacy to have explained this adjustment and sent a new prescription in already.   Ms [name] has been trying to reach patient by phone to explain this adjustment. She tried calling him twice and left message in Arabic for him to return call and again in [place] stating the reason for the request for a return call. Since I will be on vacation starting 07/15/13, I asked [name] Plyler to help me with this issue, she kindly agreed to continue to try to reach patient.   Ivor Costa, MD PGY3, Internal Medicine Teaching Service Pager: [phone]

## 2013-07-14 NOTE — Telephone Encounter (Signed)
Rx faxed to [facility] - see "Order Only" encounter.

## 2013-08-03 ENCOUNTER — Ambulatory Visit: Payer: No Typology Code available for payment source | Admitting: Internal Medicine

## 2013-10-02 ENCOUNTER — Telehealth: Payer: Self-pay | Admitting: *Deleted

## 2013-10-02 NOTE — Telephone Encounter (Signed)
Wife of pt came to clinic - now has BC/BS and unable to pay for insulin. $110.00 with  Insurance. Talked with Nelva Bush - social worker - suggest MAP. Address given. Hilda Blades Jenelle Drennon RN 10/02/13 9:45AM

## 2013-10-06 ENCOUNTER — Telehealth: Payer: Self-pay | Admitting: *Deleted

## 2013-10-06 NOTE — Telephone Encounter (Signed)
He no showed his July appt. He needs to come to [facility] for F/U appt. We have openings tomorrow. Pls sch appt ASAP and we can address insulin tomorrow. May also sch appt with [name] if pt is willing.

## 2013-10-06 NOTE — Telephone Encounter (Signed)
Health dept pharmacy calls and states pt has insurance and cannot use MAP for insulin. Insulin with insurance is $110.00. Pt states he cannot afford this. Is there a less expensive medication? Please advise

## 2013-10-16 ENCOUNTER — Encounter: Payer: Self-pay | Admitting: *Deleted

## 2013-10-16 NOTE — Progress Notes (Signed)
Pt family came into clinic on Friday at 4:40 stating pt's insurance has changed and he can't afford insulin.   Note in chart from 9/8 states the same and pt was supposed to be seen in clinic.  Family states he was not aware.  Family also stated pt had low sugar, got sweaty and had to eat to feel better. I talked with Dr Dareen Piano and he was concerned about giving insulin if pt having low sugars.  Also he does not have test strips so does not know his values. Pt given appointment for Tuesday at 8:45 for clinic visit.

## 2013-10-20 ENCOUNTER — Ambulatory Visit: Payer: Self-pay | Admitting: Internal Medicine

## 2013-10-26 NOTE — Telephone Encounter (Signed)
No show this am

## 2013-11-02 ENCOUNTER — Encounter: Payer: Self-pay | Admitting: *Deleted

## 2014-02-09 ENCOUNTER — Encounter: Payer: Self-pay | Admitting: Internal Medicine

## 2014-02-09 ENCOUNTER — Ambulatory Visit (INDEPENDENT_AMBULATORY_CARE_PROVIDER_SITE_OTHER): Payer: BLUE CROSS/BLUE SHIELD | Admitting: Internal Medicine

## 2014-02-09 VITALS — BP 113/69 | HR 74 | Temp 98.0°F | Ht 74.0 in | Wt 185.4 lb

## 2014-02-09 DIAGNOSIS — Z794 Long term (current) use of insulin: Secondary | ICD-10-CM

## 2014-02-09 DIAGNOSIS — R109 Unspecified abdominal pain: Secondary | ICD-10-CM

## 2014-02-09 DIAGNOSIS — E1165 Type 2 diabetes mellitus with hyperglycemia: Secondary | ICD-10-CM | POA: Diagnosis not present

## 2014-02-09 DIAGNOSIS — R1084 Generalized abdominal pain: Secondary | ICD-10-CM

## 2014-02-09 DIAGNOSIS — R739 Hyperglycemia, unspecified: Secondary | ICD-10-CM

## 2014-02-09 DIAGNOSIS — E119 Type 2 diabetes mellitus without complications: Secondary | ICD-10-CM | POA: Diagnosis not present

## 2014-02-09 DIAGNOSIS — IMO0002 Reserved for concepts with insufficient information to code with codable children: Secondary | ICD-10-CM

## 2014-02-09 DIAGNOSIS — E1121 Type 2 diabetes mellitus with diabetic nephropathy: Secondary | ICD-10-CM

## 2014-02-09 LAB — POCT GLYCOSYLATED HEMOGLOBIN (HGB A1C): Hemoglobin A1C: >14

## 2014-02-09 LAB — POCT URINALYSIS DIPSTICK
BILIRUBIN UA: NEGATIVE
Blood, UA: NEGATIVE
GLUCOSE UA: 500
KETONES UA: NEGATIVE
Leukocytes, UA: NEGATIVE
Nitrite, UA: NEGATIVE
Protein, UA: NEGATIVE
Urobilinogen, UA: 0.2
pH, UA: 5

## 2014-02-09 LAB — GLUCOSE, CAPILLARY: Glucose-Capillary: 406 mg/dL — ABNORMAL HIGH (ref 70–99)

## 2014-02-09 LAB — BASIC METABOLIC PANEL
ANION GAP: 9 (ref 5–15)
BUN: 15 mg/dL (ref 6–23)
CO2: 25 mmol/L (ref 19–32)
Calcium: 8.8 mg/dL (ref 8.4–10.5)
Chloride: 97 mEq/L (ref 96–112)
Creatinine, Ser: 0.96 mg/dL (ref 0.50–1.35)
GFR calc Af Amer: >90 mL/min (ref >90)
GFR calc non Af Amer: >90 mL/min (ref >90)
Glucose, Bld: 611 mg/dL (ref 70–99)
Potassium: 4.2 mmol/L (ref 3.5–5.1)
Sodium: 131 mmol/L — ABNORMAL LOW (ref 135–145)

## 2014-02-09 MED ORDER — METFORMIN HCL 1000 MG PO TABS: 1000.0000 mg | ORAL_TABLET | Freq: Two times a day (BID) | ORAL | Status: DC

## 2014-02-09 MED ORDER — INSULIN NPH ISOPHANE & REGULAR (70-30) 100 UNIT/ML ~~LOC~~ SUSP: 20.0000 [IU] | Freq: Two times a day (BID) | SUBCUTANEOUS | Status: DC

## 2014-02-09 MED ORDER — SODIUM CHLORIDE 0.9 % IV SOLN
Freq: Once | INTRAVENOUS | Status: AC
  Administered 2014-02-09: 10:00:00 via INTRAVENOUS

## 2014-02-09 NOTE — Assessment & Plan Note (Addendum)
Lab Results  Component Value Date   HGBA1C >14.0 02/09/2014   HGBA1C 12.8 05/25/2013   HGBA1C >14.0 02/11/2013     Assessment: Diabetes control: poor control (HgbA1C >9%) Progress toward A1C goal:  deteriorated Comments: off insulin x 3 months, off metformin x 3 days, CBG >600 today.  Will need to eval for DKA  Plan: Medications:  Novolin 20 u BID (was Rxed 30 in am and 20 in PM, but taking only 20 once a day, any progress that can be made will be good) Home glucose monitoring: Frequency: 3 times a day Timing: before meals Instruction/counseling given: reminded to bring blood glucose meter & log to each visit, reminded to bring medications to each visit, discussed diet and discussed sick day management Educational resources provided:   Self management tools provided:   Other plans: Obtaining BMP and U/A do evaluate for DKA.  If no evidence we may be able to send him home today.  I will write a new specifically for Relion insulin 70/30 20u BID to hopefully bring down the cost to $25 which he says he can afford. I do suspect with his uncontrolled DM that he has peripheral neuropathy will hold off on any symptomatic treatment as all of our focus should be on controlling his sugars.  MDM While workup is in process will start IV and give 1 L NS IVF Cannot give Novolog due to allergy, no other rapid acting insulins in stock. Urine dip reassuring>> no ketones. 10am BMP returned, glucose 611, Bicarb 25 AG 9>>> No DKA just hyperglycemia Will finish 1 L bolus and avoid admission.  Recheck CBG after bolus to ensure <600.

## 2014-02-09 NOTE — Progress Notes (Signed)
[facility] Subjective:   Patient ID: Marciano Mundt male   DOB: [birth year] 49 y.o.   MRN: [id number]  HPI: Mr.Fuad Sparr is a 49 y.o. male with a PMH of severly uncontrolled DM below who presents for stomach pain.  He is accompanied by an interpreter. He reports that his stomach pain is located across his abdomen. It does not radiate. It is NOT associated with diarrhea, constipation, or vomiting.  He does have some nausea.  He describes the pain as "colic."  He reports the pain started 3 days ago, it is getting a little better.  He notes he is not eating too well but also notes that last night he ate cake and milk. He reports that he has not taken any insulin in >3 months due to cost, Novolin 70/30 is costing him $100 to pick up.  He also reports that he has been out of metformin for the past 3 days.  He also attributes this to cost.  I asked him if he can afford $4 which he reports that he can.  As a separate complaint he notes that he has been having some tingling in his feet over the past 3 months after he stopped insulin.  In the office today our CBG meter reads >600.   Past Medical History  Diagnosis Date  . Diabetes mellitus   . Hyperlipidemia   . Arthritis     left knee   Current Outpatient Prescriptions  Medication Sig Dispense Refill  . Blood Glucose Monitoring Suppl (ACCU-CHEK AVIVA PLUS) W/DEVICE KIT 1 Device by Does not apply route as directed. Use to test blood glucose 2-3 times daily. Dx: 250.00 1 kit 0  . glucose blood (ACCU-CHEK AVIVA) test strip Use to test blood glucose 2-3 times daily. Dx:250.00 100 each 12  . insulin NPH-regular Human (NOVOLIN 70/30 RELION) (70-30) 100 UNIT/ML injection Inject 20 Units into the skin 2 (two) times daily with a meal. 10 mL 11  . Insulin Syringe-Needle U-100 (INSULIN SYRINGE .5CC/31GX5/16") 31G X 5/16" 0.5 ML MISC 1 each by Does not apply route daily. Use as instructed 100 each 3  . Lancet Devices (ACCU-CHEK SOFTCLIX)  lancets Use to test blood glucose 2-3 times daily. Dx: 250.00 1 each 6  . metFORMIN (GLUCOPHAGE) 1000 MG tablet Take 1 tablet (1,000 mg total) by mouth 2 (two) times daily with a meal. 60 tablet 11  . pravastatin (PRAVACHOL) 40 MG tablet Take 1 tablet (40 mg total) by mouth daily. 30 tablet 6   No current facility-administered medications for this visit.   No family history on file. History   Social History  . Marital Status: Married    Spouse Name: N/A    Number of Children: N/A  . Years of Education: N/A   Social History Main Topics  . Smoking status: Never Smoker   . Smokeless tobacco: None  . Alcohol Use: No  . Drug Use: No  . Sexual Activity: None     Comment: immigrated from [place], speaks arabic, married   Other Topics Concern  . None   Social History Narrative   Review of Systems: Review of Systems  Constitutional: Negative for fever, chills, weight loss and malaise/fatigue.  Eyes: Positive for blurred vision.  Respiratory: Negative for cough and shortness of breath.   Cardiovascular: Negative for chest pain and leg swelling.  Gastrointestinal: Positive for nausea and abdominal pain. Negative for heartburn, vomiting, diarrhea, constipation and blood in stool.  Genitourinary: Negative for dysuria.  Musculoskeletal: Negative for myalgias.  Neurological: Negative for dizziness and headaches.  Psychiatric/Behavioral: Negative for substance abuse.     Objective:  Physical Exam: Filed Vitals:   02/09/14 0822  BP: 113/69  Pulse: 74  Temp: 98 F (36.7 C)  TempSrc: Oral  Height: _0  (1.88 m)  Weight: 185 lb 6.4 oz (84.097 kg)  SpO2: 100%  Physical Exam  Constitutional: No distress.  HENT:  Head: Normocephalic and atraumatic.  Cardiovascular: Normal rate, regular rhythm, normal heart sounds and intact distal pulses.   No murmur heard. Pulmonary/Chest: Effort normal and breath sounds normal. No respiratory distress. He has no wheezes. He has no rales.   Abdominal: Soft. Bowel sounds are normal. He exhibits no distension. There is tenderness (mild tenderness in all quadrants). There is no rebound and no guarding.  Musculoskeletal: He exhibits no edema.  Neurological:  Monofilament exam completed and normal bilaterally.  Nursing note and vitals reviewed.   Assessment & Plan:  Case discussed with Dr. 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  Will need to eval for DKA  Plan: Medications:  Novolin 20 u BID (was Rxed 30 in am and 20 in PM, but taking only 20 once a day, any progress that can be made will be good) Home glucose monitoring: Frequency: 3 times a day Timing: before meals Instruction/counseling given: reminded to bring blood glucose meter & log to each visit, reminded to bring medications to each visit, discussed diet and discussed sick day management Educational resources provided:   Self management tools provided:   Other plans: Obtaining BMP and U/A do evaluate for DKA.  If no evidence we may be able to send him home today.  I will write a new specifically for Relion insulin 70/30 20u BID to hopefully bring down the cost to $25 which he says he can afford. I do suspect with his uncontrolled DM that he has peripheral neuropathy will hold off on any symptomatic treatment as all of our focus should be on controlling his sugars.  MDM While workup is in process will start IV and give 1 L NS IVF Cannot give Novolog due to allergy, no other rapid acting insulins in stock. Urine dip reassuring>> no ketones. 10am BMP returned, glucose 611, Bicarb 25 AG 9>>> No DKA just hyperglycemia Will finish 1 L bolus and avoid  admission.  Recheck CBG after bolus to ensure <600.        Abdominal pain Unclear cause of his abdominal pain, may be related to his hyperglycemia.  He does not have concerning symptoms and has a reassuring abdominal exam.  His abdominal pain improved with the 1L bolus and improvement of his hyperglycemia.  Will send patient home with return precautions.     Medications Ordered Meds ordered this encounter  Medications  . sodium chloride 0.9 % 1,000 mL IV fluid bolus    Sig:   . metFORMIN (GLUCOPHAGE) 1000 MG tablet    Sig: Take 1 tablet (1,000 mg total) by mouth 2 (two) times daily with a meal.    Dispense:  60 tablet    Refill:  11  . insulin NPH-regular Human (NOVOLIN 70/30 RELION) (70-30) 100 UNIT/ML injection  Sig: Inject 20 Units into the skin 2 (two) times daily with a meal.    Dispense:  10 mL    Refill:  11   Other Orders Orders Placed This Encounter  Procedures  . Glucose, capillary  . Basic metabolic panel  . Urinalysis, Reflex Microscopic  . Glucose, capillary  . Urine Microscopic  . POCT HgB A1C  . POCT Urinalysis Dipstick (81002)

## 2014-02-09 NOTE — Progress Notes (Signed)
After IV fluids, CBG down to 406; Dr Heber Tar Heel informed.

## 2014-02-09 NOTE — Patient Instructions (Signed)
General Instructions: I want you to go and pick up your medications NOW from the drugstore.  Please take 20 units of insulin twice a day with meals.  Please take metformin as directed.  Please bring your medicines with you each time you come to clinic.  Medicines may include prescription medications, over-the-counter medications, herbal remedies, eye drops, vitamins, or other pills.   Progress Toward Treatment Goals:  Treatment Goal 02/09/2014  Hemoglobin A1C deteriorated    Self Care Goals & Plans:  Self Care Goal 02/09/2014  Manage my medications take my medicines as prescribed; bring my medications to every visit  Monitor my health keep track of my blood glucose; bring my glucose meter and log to each visit  Eat healthy foods drink diet soda or water instead of juice or soda; eat more vegetables; eat foods that are low in salt; eat baked foods instead of fried foods  Be physically active find an activity I enjoy    Home Blood Glucose Monitoring 02/09/2014  Check my blood sugar 3 times a day  When to check my blood sugar before meals     Care Management & Community Referrals:  No flowsheet data found.

## 2014-02-10 LAB — URINALYSIS, MICROSCOPIC ONLY
CASTS: NONE SEEN
Crystals: NONE SEEN
SQUAMOUS EPITHELIAL / LPF: NONE SEEN

## 2014-02-10 LAB — URINALYSIS, ROUTINE W REFLEX MICROSCOPIC
Bilirubin Urine: NEGATIVE
Glucose, UA: >1000 mg/dL — AB
HGB URINE DIPSTICK: NEGATIVE
KETONES UR: NEGATIVE mg/dL
Leukocytes, UA: NEGATIVE
NITRITE: NEGATIVE
Protein, ur: NEGATIVE mg/dL
SPECIFIC GRAVITY, URINE: 1.024 (ref 1.005–1.030)
UROBILINOGEN UA: 0.2 mg/dL (ref 0.0–1.0)
pH: 5.5 (ref 5.0–8.0)

## 2014-02-11 NOTE — Assessment & Plan Note (Addendum)
Unclear cause of his abdominal pain, may be related to his hyperglycemia.  He does not have concerning symptoms and has a reassuring abdominal exam.  His abdominal pain improved with the 1L bolus and improvement of his hyperglycemia.  Will send patient home with return precautions. - Check U/A and urine dip>> no evidence of UTI

## 2014-02-11 NOTE — Progress Notes (Signed)
INTERNAL MEDICINE TEACHING ATTENDING ADDENDUM - Aldine Contes, MD: I reviewed and discussed at the time of visit with the resident Dr. Burney Gauze, the patient's medical history, physical examination, diagnosis and results of pertinent tests and treatment and I agree with the patient's care as documented.

## 2014-03-11 ENCOUNTER — Telehealth: Payer: Self-pay | Admitting: Internal Medicine

## 2014-03-11 NOTE — Telephone Encounter (Signed)
Call to patient to confirm appointment for 03/15/14 at 8:15 lmtcb

## 2014-03-15 ENCOUNTER — Ambulatory Visit: Payer: BLUE CROSS/BLUE SHIELD | Admitting: Internal Medicine

## 2014-04-22 ENCOUNTER — Telehealth: Payer: Self-pay | Admitting: Internal Medicine

## 2014-04-22 NOTE — Telephone Encounter (Signed)
Call to patient to confirm appointment for 04/26/14 at 8:15 someone stated it was the incorrect number.

## 2014-04-26 ENCOUNTER — Encounter: Payer: Self-pay | Admitting: Internal Medicine

## 2014-04-26 ENCOUNTER — Ambulatory Visit: Payer: BLUE CROSS/BLUE SHIELD | Admitting: Internal Medicine

## 2014-06-03 IMAGING — CR DG THORACIC SPINE 3V
3 series · 3 of 3 positions shown · non-contrast
Comparison: 07/23/2011

CLINICAL DATA: Upper back pain

THORACIC SPINE - 2 VIEW + SWIMMERS

[t t-spine a.p.]
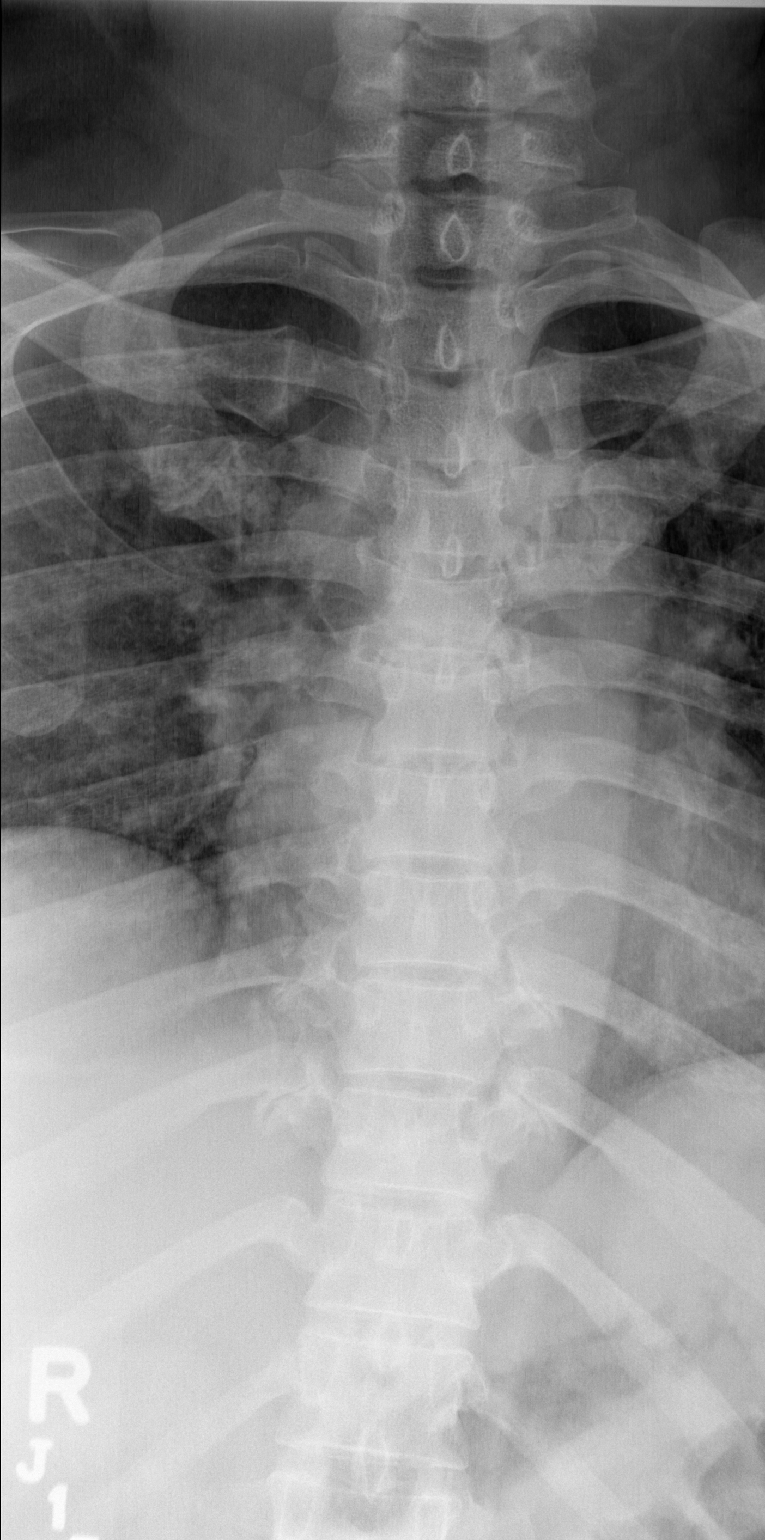

[t t-spine lat]
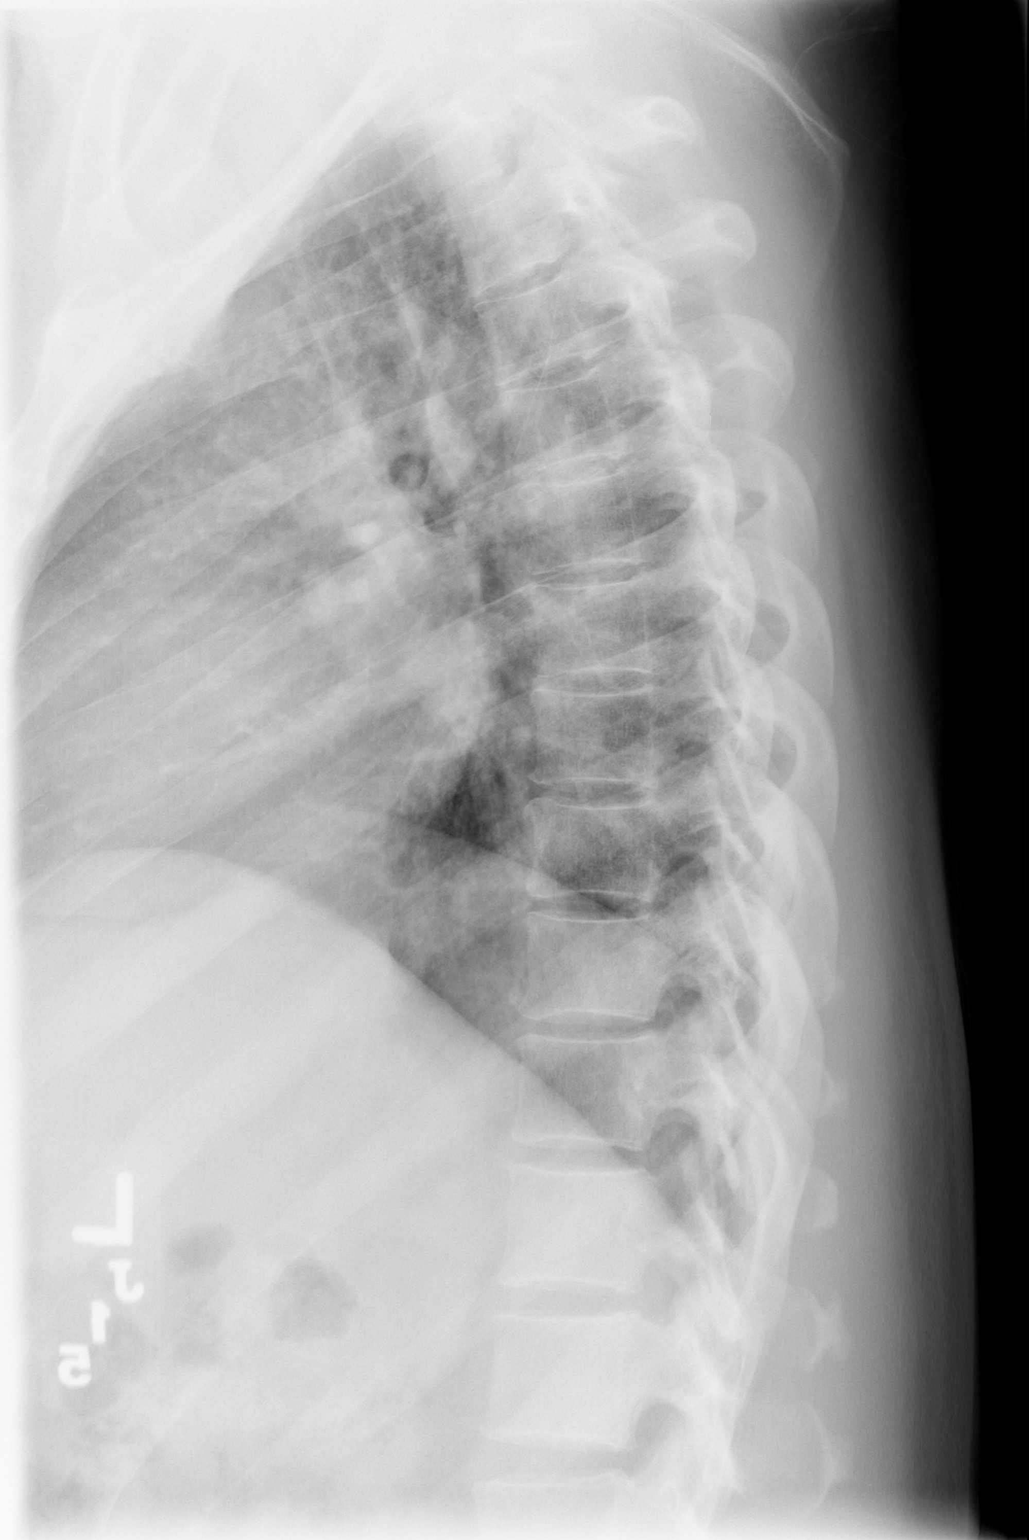

[t swimmers]
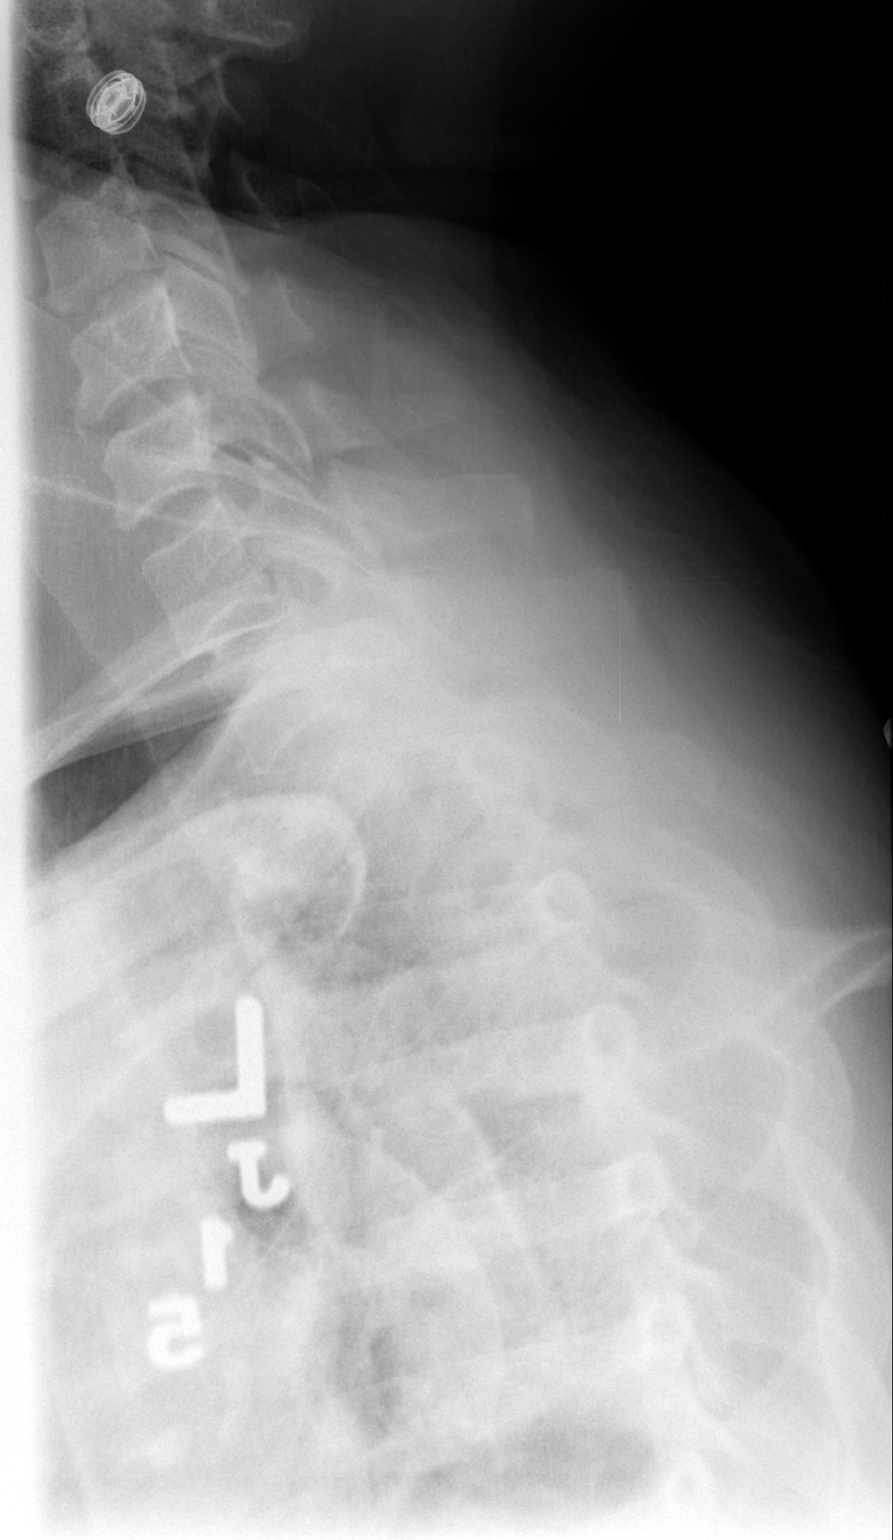

[3 of 3 positions shown; findings below may reference images not displayed]

FINDINGS: Normal alignment.  No compression fracture, wedge shaped
deformity or focal kyphosis.  Preserved vertebral body heights and
disc spaces.  Mild arthritic changes of the lower thoracic
articulations to the ribs.  This is most pronounced at T9 and T10.
Normal paraspinous soft tissues.  Intact pedicles.
IMPRESSION: Mild degenerative changes.  No acute osseous finding

## 2014-06-03 IMAGING — CR DG CHEST 2V
2 series · 2 of 2 positions shown · non-contrast
Comparison: 01/14/2009

CLINICAL DATA: Throat pain, difficulty swallowing, upper back pain

CHEST - 2 VIEW

[w chest pa]
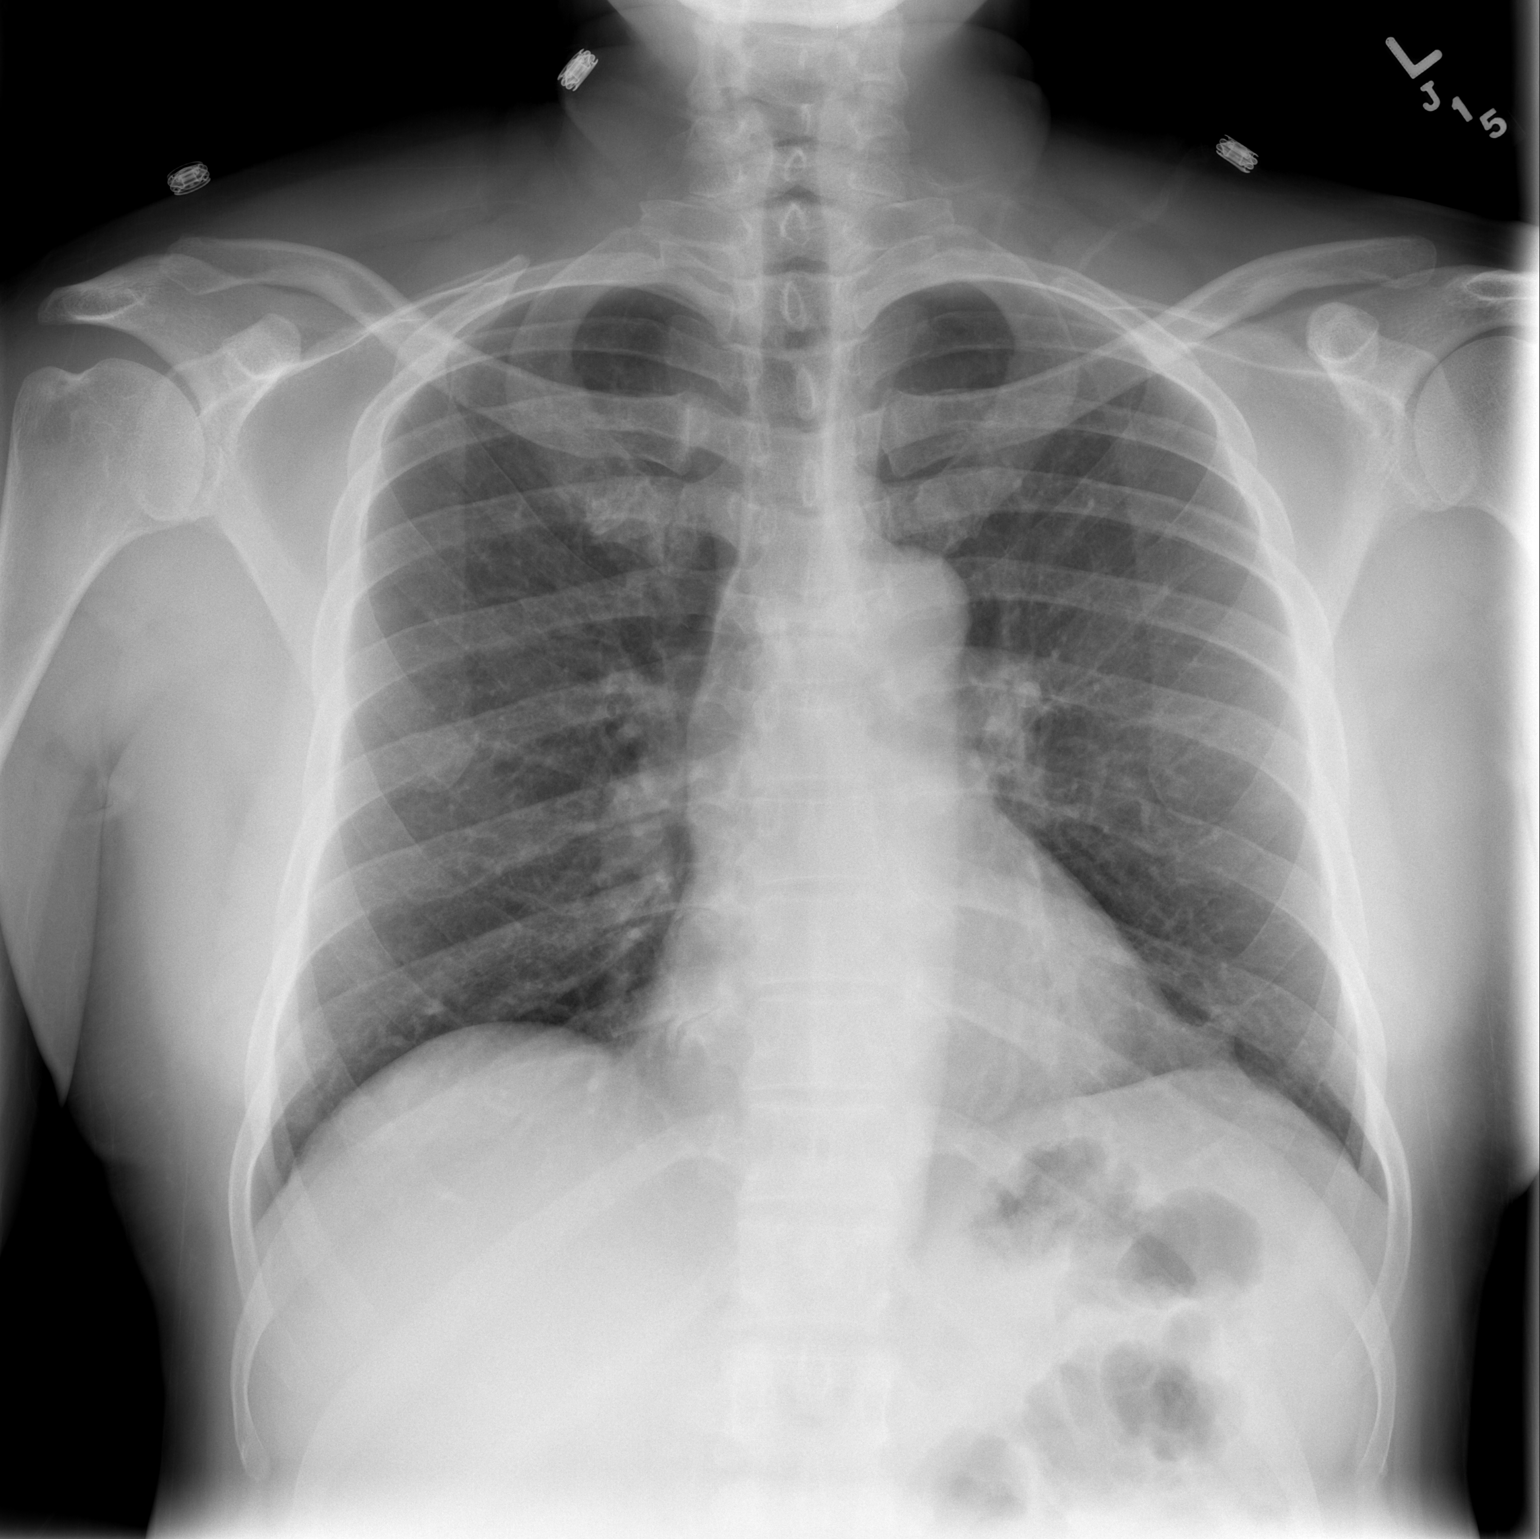

[w chest lat]
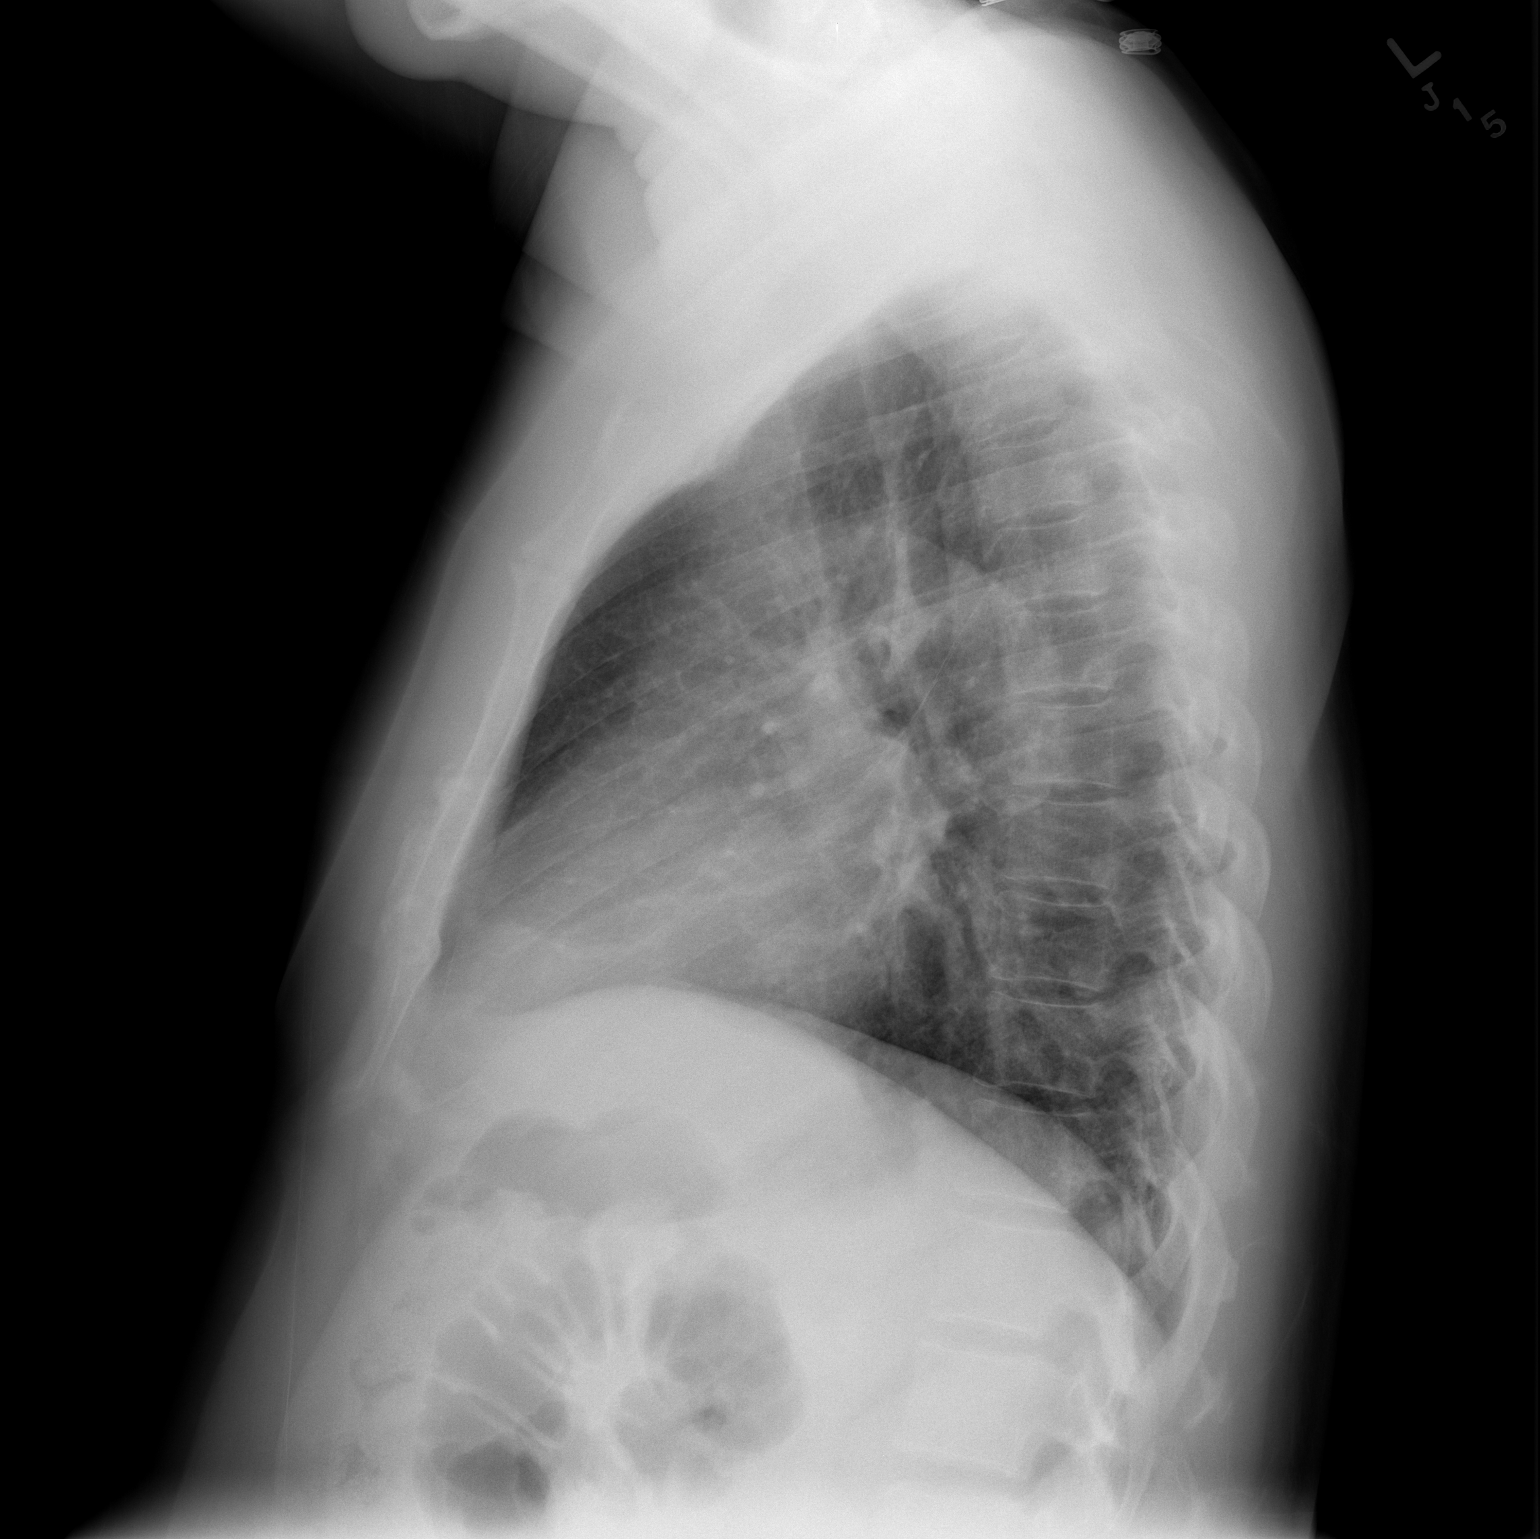

[2 of 2 positions shown; findings below may reference images not displayed]

FINDINGS: The heart size and mediastinal contours are within
normal limits.  Both lungs are clear.  The visualized skeletal
structures are unremarkable.
IMPRESSION: No active cardiopulmonary disease.

## 2014-08-14 ENCOUNTER — Other Ambulatory Visit: Payer: Self-pay | Admitting: Internal Medicine

## 2014-08-23 ENCOUNTER — Ambulatory Visit (INDEPENDENT_AMBULATORY_CARE_PROVIDER_SITE_OTHER): Payer: BLUE CROSS/BLUE SHIELD | Admitting: Internal Medicine

## 2014-08-23 ENCOUNTER — Encounter: Payer: Self-pay | Admitting: Internal Medicine

## 2014-08-23 VITALS — BP 125/73 | HR 74 | Temp 98.6°F | Wt 203.7 lb

## 2014-08-23 DIAGNOSIS — E1121 Type 2 diabetes mellitus with diabetic nephropathy: Secondary | ICD-10-CM | POA: Diagnosis not present

## 2014-08-23 DIAGNOSIS — E785 Hyperlipidemia, unspecified: Secondary | ICD-10-CM | POA: Diagnosis not present

## 2014-08-23 DIAGNOSIS — Z794 Long term (current) use of insulin: Secondary | ICD-10-CM

## 2014-08-23 DIAGNOSIS — E1165 Type 2 diabetes mellitus with hyperglycemia: Secondary | ICD-10-CM | POA: Diagnosis not present

## 2014-08-23 DIAGNOSIS — E119 Type 2 diabetes mellitus without complications: Secondary | ICD-10-CM

## 2014-08-23 DIAGNOSIS — IMO0002 Reserved for concepts with insufficient information to code with codable children: Secondary | ICD-10-CM

## 2014-08-23 LAB — LIPID PANEL
CHOL/HDL RATIO: 3.3 ratio (ref <=5.0)
CHOLESTEROL: 167 mg/dL (ref 125–200)
HDL: 51 mg/dL (ref >=40)
LDL Cholesterol: 88 mg/dL (ref <130)
Triglycerides: 138 mg/dL (ref <150)
VLDL: 28 mg/dL (ref <30)

## 2014-08-23 LAB — HM DIABETES EYE EXAM

## 2014-08-23 LAB — GLUCOSE, CAPILLARY: Glucose-Capillary: 302 mg/dL — ABNORMAL HIGH (ref 65–99)

## 2014-08-23 LAB — POCT GLYCOSYLATED HEMOGLOBIN (HGB A1C)

## 2014-08-23 NOTE — Patient Instructions (Signed)
Thank you for your visit.  Please continue to take 25 Units insulin before work with a meal. Please start taking 35 Units of insulin before you sleep with a meal.  Continue your Metformin and Pravastatin as you are.  Try to check your blood sugar everyday.  We will follow up in 1 month.

## 2014-08-23 NOTE — Assessment & Plan Note (Addendum)
Patient has severely uncontrolled diabetes with last two Hgb A1C levels above 14. He does not check his blood sugar consistently and only eats 1-2 meals per day. Meter readings from April show CBG numbers in the 400s. He works the night shift, from 5 pm to 3 am. During his work shift, he notices low blood sugars when he feels tired and dizzy. He corrects this with candy or sugary drinks. He states that he is taking Novolin 25 Units twice a day. He has occasional numbness in his toes.  Patient is advised to eat regularly, at least to have several snacks throughout the day. He reports that he does not like snacks and that he has been given nutritional advice before and does not need more advice on what to eat. Patient states that he will take his insulin with meals.  -Continue Novolin 25 Units with a meal before work (in the pm). -Increase Novolin to 35 Units with a meal at bedtime (in the am after work). -Check Hgb A1C (result: >14.0), CBG (302) -Retinal/fundus photography -Urine microalbumin

## 2014-08-23 NOTE — Assessment & Plan Note (Signed)
Lab Results  Component Value Date   CHOL 183 02/11/2013   HDL 40 02/11/2013   LDLCALC 97 02/11/2013   TRIG 231* 02/11/2013   CHOLHDL 4.6 02/11/2013   -Check Lipid Panel

## 2014-08-23 NOTE — Progress Notes (Signed)
Patient ID: Oluwatimileyin Vivier, male   DOB: Oct 18, 1965, 49 y.o.   MRN: [id number]   Subjective:   Patient ID: Linden Tagliaferro male   DOB: April 18, 1965 49 y.o.   MRN: [id number]  HPI: Mr.Youssouf Oelkers is a 49 y.o. male with PMH of uncontrolled type 2 DM and HLD who presents for a regular follow up visit. He speaks Arabic and is accompanied with a Optometrist.   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 Patient states that he is currently taking Novolin 25 Units twice a day with meals. He also reports that he usually only eats two meals a day, sometimes only one meal. He does not check his blood sugar regularly and reports that he has occasional highs and lows. He notices his low blood sugars occur when he is at work after several hours of activity. When low, he feels dizzy and tired and eats candy or sugar which improves his symptoms.  During highs, he has increased frequency of urination and hazy vision. He does not do anything to correct this. Patient has complaints of occasional numbness in his toes. He states that he checks his feet often for cuts or wounds.  He is also taking Metformin 1000 mg twice a day and Pravastatin 40 mg once daily.    Past Medical History  Diagnosis Date  . Diabetes mellitus   . Hyperlipidemia   . Arthritis     left knee   Current Outpatient Prescriptions  Medication Sig Dispense Refill  . Blood Glucose Monitoring Suppl (ACCU-CHEK AVIVA PLUS) W/DEVICE KIT 1 Device by Does not apply route as directed. Use to test blood glucose 2-3 times daily. Dx: 250.00 1 kit 0  . glucose blood (ACCU-CHEK AVIVA) test strip Use to test blood glucose 2-3 times daily. Dx:250.00 100 each 12  . insulin NPH-regular Human (NOVOLIN 70/30 RELION) (70-30) 100 UNIT/ML injection Inject 20 Units into the skin 2 (two) times daily with a meal. 10 mL 11  . Insulin Syringe-Needle U-100 (INSULIN SYRINGE .5CC/31GX5/16") 31G X 5/16" 0.5 ML MISC 1 each by Does not apply route daily. Use as  instructed 100 each 3  . Lancet Devices (ACCU-CHEK SOFTCLIX) lancets Use to test blood glucose 2-3 times daily. Dx: 250.00 1 each 6  . metFORMIN (GLUCOPHAGE) 1000 MG tablet Take 1 tablet (1,000 mg total) by mouth 2 (two) times daily with a meal. 60 tablet 11  . pravastatin (PRAVACHOL) 40 MG tablet Take 1 tablet (40 mg total) by mouth daily. 30 tablet 6   No current facility-administered medications for this visit.   No family history on file. History   Social History  . Marital Status: Married    Spouse Name: N/A  . Number of Children: N/A  . Years of Education: N/A   Social History Main Topics  . Smoking status: Never Smoker   . Smokeless tobacco: Not on file  . Alcohol Use: No  . Drug Use: No  . Sexual Activity: Not on file     Comment: immigrated from [place], speaks arabic, married   Other Topics Concern  . None   Social History Narrative   Review of Systems: Review of Systems  Constitutional: Negative for fever and diaphoresis.  Eyes: Positive for blurred vision.  Respiratory: Negative for cough, shortness of breath and wheezing.   Cardiovascular: Negative for chest pain and palpitations.  Gastrointestinal: Negative for nausea, vomiting, abdominal pain, diarrhea, constipation and blood in stool.  Genitourinary:  Positive for frequency. Negative for dysuria, urgency and hematuria.  Musculoskeletal: Negative for myalgias, joint pain and falls.  Neurological: Positive for dizziness. Negative for weakness and headaches.   Objective:  Physical Exam: Filed Vitals:   08/23/14 0904  BP: 125/73  Pulse: 74  Temp: 98.6 F (37 C)  TempSrc: Oral  Weight: 203 lb 11.2 oz (92.398 kg)  SpO2: 100%   Physical Exam  Constitutional: He is oriented to person, place, and time. He appears well-developed and well-nourished.  HENT:  Head: Normocephalic and atraumatic.  Eyes: Conjunctivae and EOM are normal.  Neck: Normal range of motion.  Cardiovascular: Normal rate, regular rhythm  and intact distal pulses.   Pulmonary/Chest: Effort normal and breath sounds normal.  Abdominal: Soft. There is no tenderness.  Musculoskeletal: Normal range of motion.  Neurological: He is alert and oriented to person, place, and time.  Skin: Skin is warm.  Psychiatric: He has a normal mood and affect.     Assessment & Plan:  Please see problem based charting for current assessment and plan.

## 2014-08-24 LAB — MICROALBUMIN / CREATININE URINE RATIO: CREATININE, URINE: 69.5 mg/dL

## 2014-08-26 ENCOUNTER — Encounter: Payer: Self-pay | Admitting: Internal Medicine

## 2014-08-26 ENCOUNTER — Ambulatory Visit (INDEPENDENT_AMBULATORY_CARE_PROVIDER_SITE_OTHER): Payer: BLUE CROSS/BLUE SHIELD | Admitting: Internal Medicine

## 2014-08-26 VITALS — BP 114/75 | HR 67 | Temp 98.7°F | Ht 74.0 in | Wt 204.8 lb

## 2014-08-26 DIAGNOSIS — IMO0002 Reserved for concepts with insufficient information to code with codable children: Secondary | ICD-10-CM

## 2014-08-26 DIAGNOSIS — R300 Dysuria: Secondary | ICD-10-CM | POA: Diagnosis not present

## 2014-08-26 DIAGNOSIS — E785 Hyperlipidemia, unspecified: Secondary | ICD-10-CM

## 2014-08-26 DIAGNOSIS — E1165 Type 2 diabetes mellitus with hyperglycemia: Principal | ICD-10-CM

## 2014-08-26 DIAGNOSIS — E1121 Type 2 diabetes mellitus with diabetic nephropathy: Secondary | ICD-10-CM

## 2014-08-26 LAB — POCT URINALYSIS DIPSTICK
Bilirubin, UA: NEGATIVE
Ketones, UA: NEGATIVE
LEUKOCYTES UA: NEGATIVE
Nitrite, UA: POSITIVE
PH UA: 5
PROTEIN UA: NEGATIVE
RBC UA: NEGATIVE
Spec Grav, UA: 1.02
Urobilinogen, UA: 0.2

## 2014-08-26 LAB — GLUCOSE, CAPILLARY: Glucose-Capillary: 255 mg/dL — ABNORMAL HIGH (ref 65–99)

## 2014-08-26 MED ORDER — SULFAMETHOXAZOLE-TRIMETHOPRIM 800-160 MG PO TABS: 1.0000 | ORAL_TABLET | Freq: Two times a day (BID) | ORAL | Status: DC

## 2014-08-26 MED ORDER — PRAVASTATIN SODIUM 40 MG PO TABS: 40.0000 mg | ORAL_TABLET | Freq: Every day | ORAL | Status: DC

## 2014-08-26 NOTE — Assessment & Plan Note (Addendum)
Pt says that he had difficulty voiding, and pain while urination, for 3 days.  Had subjective fevers, but no other constitutional symptoms. No hematuria, no unusual discharge or lesions/sores Pt is married and sexually active with the wife only.  A/P, PE was unremarkable except the prostate was tender to examination and there was marked suprapubic pain. No penile sores, or discharge noted .  Dipstick was positive for nitrites only, no RBCs or leuks. Most likely cystitis with BPH Ordered complete urinalysis Given bactrim DS 800-160 q12 hours for 10 days, and asked to RTC if symptoms don't improve. If symptoms don't improve then it may be his BPH and in that case we may have to start him on flomax

## 2014-08-26 NOTE — Progress Notes (Signed)
Medicine attending: I personally interviewed and briefly examined this patient, and reviewed pertinent clinical laboratory  data  with resident physician Dr.Parth Tiburcio Pea and we discussed a   management plan.

## 2014-08-26 NOTE — Progress Notes (Signed)
Patient ID: [name], male   DOB: 08-15-1965, 49 y.o.   MRN: [id number]    Subjective:   Patient ID: [name] male   DOB: 01-Oct-1965 49 y.o.   MRN: [id number]  HPI: [name] is a 49 y.o. man who is here for 3 days of dysuria and difficulty voiding, and felt hot yesterday.  He has notable PMH of uncontrolled diabetes with A1Cs in the 14%.    Past Medical History  Diagnosis Date  . Diabetes mellitus   . Hyperlipidemia   . Arthritis     left knee   Current Outpatient Prescriptions  Medication Sig Dispense Refill  . Blood Glucose Monitoring Suppl (ACCU-CHEK AVIVA PLUS) W/DEVICE KIT 1 Device by Does not apply route as directed. Use to test blood glucose 2-3 times daily. Dx: 250.00 1 kit 0  . glucose blood (ACCU-CHEK AVIVA) test strip Use to test blood glucose 2-3 times daily. Dx:250.00 100 each 12  . insulin NPH-regular Human (NOVOLIN 70/30 RELION) (70-30) 100 UNIT/ML injection Inject 20 Units into the skin 2 (two) times daily with a meal. 10 mL 11  . Insulin Syringe-Needle U-100 (INSULIN SYRINGE .5CC/31GX5/16") 31G X 5/16" 0.5 ML MISC 1 each by Does not apply route daily. Use as instructed 100 each 3  . Lancet Devices (ACCU-CHEK SOFTCLIX) lancets Use to test blood glucose 2-3 times daily. Dx: 250.00 1 each 6  . metFORMIN (GLUCOPHAGE) 1000 MG tablet Take 1 tablet (1,000 mg total) by mouth 2 (two) times daily with a meal. 60 tablet 11  . pravastatin (PRAVACHOL) 40 MG tablet Take 1 tablet (40 mg total) by mouth daily. 30 tablet 6  . sulfamethoxazole-trimethoprim (BACTRIM DS,SEPTRA DS) 800-160 MG per tablet Take 1 tablet by mouth 2 (two) times daily. 25 tablet 0   No current facility-administered medications for this visit.   No family history on file. History   Social History  . Marital Status: Married    Spouse Name: N/A  . Number of Children: N/A  . Years of Education: 12   Occupational History  .      employed at [employer] History Main Topics  . Smoking status:  Never Smoker   . Smokeless tobacco: Not on file  . Alcohol Use: No  . Drug Use: No  . Sexual Activity: Not on file     Comment: immigrated from [place], speaks arabic, married   Other Topics Concern  . None   Social History Narrative   Review of Systems: Review of Systems  Constitutional: Positive for fever and malaise/fatigue. Negative for chills, weight loss and diaphoresis.  HENT: Negative.   Respiratory: Negative.  Negative for cough, shortness of breath and wheezing.   Cardiovascular: Negative.  Negative for chest pain and palpitations.  Gastrointestinal: Negative for nausea, vomiting, abdominal pain and diarrhea.  Genitourinary: Positive for dysuria. Negative for urgency, frequency, hematuria and flank pain.       Difficulty starting stream,  And pain halfway through finishing    Objective:  Physical Exam: Filed Vitals:   08/26/14 0846  BP: 114/75  Pulse: 67  Temp: 98.7 F (37.1 C)  TempSrc: Oral  Height: '6\' 2"'  (1.88 m)  Weight: 204 lb 12.8 oz (92.897 kg)  SpO2: 100%   Physical Exam  Constitutional: He appears well-developed and well-nourished.  Eyes: EOM are normal.  Neck: Normal range of motion. Neck supple.  Cardiovascular: Normal rate, regular rhythm and normal heart sounds.   No murmur heard. Pulmonary/Chest: Effort normal and  breath sounds normal. He has no wheezes.  Abdominal: He exhibits no distension.  Soft, nontender, nondistended, positive bowel sounds in all 4 quarters,  Genitourinary: Rectum normal, testes normal and penis normal. Prostate is enlarged and tender.  GU: Marked suprapubic pain, no lesions, sores or discharge from penis.  Prostate exam: slightly enlarged and tender prostate ,   No CVA tenderness b/l    Assessment & Plan:   Please see problem based charting for assessment and plan

## 2014-08-26 NOTE — Patient Instructions (Addendum)
Thank you for your visit today  Please take the antibiotic every 12 hours (2 times in a day) for 10 days  Please return to clinic if your symptoms do not improve   Urinary Tract Infection Urinary tract infections (UTIs) can develop anywhere along your urinary tract. Your urinary tract is your body's drainage system for removing wastes and extra water. Your urinary tract includes two kidneys, two ureters, a bladder, and a urethra. Your kidneys are a pair of bean-shaped organs. Each kidney is about the size of your fist. They are located below your ribs, one on each side of your spine. CAUSES Infections are caused by microbes, which are microscopic organisms, including fungi, viruses, and bacteria. These organisms are so small that they can only be seen through a microscope. Bacteria are the microbes that most commonly cause UTIs. SYMPTOMS  Symptoms of UTIs may vary by age and gender of the patient and by the location of the infection. Symptoms in young women typically include a frequent and intense urge to urinate and a painful, burning feeling in the bladder or urethra during urination. Older women and men are more likely to be tired, shaky, and weak and have muscle aches and abdominal pain. A fever may mean the infection is in your kidneys. Other symptoms of a kidney infection include pain in your back or sides below the ribs, nausea, and vomiting. DIAGNOSIS To diagnose a UTI, your caregiver will ask you about your symptoms. Your caregiver also will ask to provide a urine sample. The urine sample will be tested for bacteria and white blood cells. White blood cells are made by your body to help fight infection. TREATMENT  Typically, UTIs can be treated with medication. Because most UTIs are caused by a bacterial infection, they usually can be treated with the use of antibiotics. The choice of antibiotic and length of treatment depend on your symptoms and the type of bacteria causing your  infection. HOME CARE INSTRUCTIONS  If you were prescribed antibiotics, take them exactly as your caregiver instructs you. Finish the medication even if you feel better after you have only taken some of the medication.  Drink enough water and fluids to keep your urine clear or pale yellow.  Avoid caffeine, tea, and carbonated beverages. They tend to irritate your bladder.  Empty your bladder often. Avoid holding urine for long periods of time.  Empty your bladder before and after sexual intercourse.  After a bowel movement, women should cleanse from front to back. Use each tissue only once. SEEK MEDICAL CARE IF:   You have back pain.  You develop a fever.  Your symptoms do not begin to resolve within 3 days. SEEK IMMEDIATE MEDICAL CARE IF:   You have severe back pain or lower abdominal pain.  You develop chills.  You have nausea or vomiting.  You have continued burning or discomfort with urination. MAKE SURE YOU:   Understand these instructions.  Will watch your condition.  Will get help right away if you are not doing well or get worse. Document Released: 10/25/2004 Document Revised: 07/17/2011 Document Reviewed: 02/23/2011 [facility] Patient Information 2015 [place], [place]. This information is not intended to replace advice given to you by your health care provider. Make sure you discuss any questions you have with your health care provider.   Benign Prostatic Hyperplasia An enlarged prostate (benign prostatic hyperplasia) is common in older men. You may experience the following:  Weak urine stream.  Dribbling.  Feeling like the bladder  has not emptied completely.  Difficulty starting urination.  Getting up frequently at night to urinate.  Urinating more frequently during the day. HOME CARE INSTRUCTIONS  Monitor your prostatic hyperplasia for any changes. The following actions may help to alleviate any discomfort you are experiencing:  Give yourself time when  you urinate.  Stay away from alcohol.  Avoid beverages containing caffeine, such as coffee, tea, and colas, because they can make the problem worse.  Avoid decongestants, antihistamines, and some prescription medicines that can make the problem worse.  Follow up with your health care provider for further treatment as recommended. SEEK MEDICAL CARE IF:  You are experiencing progressive difficulty voiding.  Your urine stream is progressively getting narrower.  You are awaking from sleep with the urge to void more frequently.  You are constantly feeling the need to void.  You experience loss of urine, especially in small amounts. SEEK IMMEDIATE MEDICAL CARE IF:   You develop increased pain with urination or are unable to urinate.  You develop severe abdominal pain, vomiting, a high fever, or fainting.  You develop back pain or blood in your urine. MAKE SURE YOU:   Understand these instructions.  Will watch your condition.  Will get help right away if you are not doing well or get worse. Document Released: 01/15/2005 Document Revised: 09/17/2012 Document Reviewed: 06/17/2012 [facility] Patient Information 2015 [place], [place]. This information is not intended to replace advice given to you by your health care provider. Make sure you discuss any questions you have with your health care provider.

## 2014-08-27 LAB — URINALYSIS, COMPLETE
Bacteria, UA: NONE SEEN [HPF]
Bilirubin Urine: NEGATIVE
Casts: NONE SEEN [LPF]
Crystals: NONE SEEN [HPF]
HGB URINE DIPSTICK: NEGATIVE
Ketones, ur: NEGATIVE
Leukocytes, UA: NEGATIVE
Nitrite: NEGATIVE
Protein, ur: NEGATIVE
RBC / HPF: NONE SEEN RBC/HPF (ref <=2)
SPECIFIC GRAVITY, URINE: 1.02 (ref 1.001–1.035)
Squamous Epithelial / LPF: NONE SEEN [HPF] (ref <=5)
WBC UA: NONE SEEN WBC/HPF (ref <=5)
Yeast: NONE SEEN [HPF]
pH: 6 (ref 5.0–8.0)

## 2014-08-27 NOTE — Progress Notes (Signed)
Internal Medicine Clinic Attending  I saw and evaluated the patient.  I personally confirmed the key portions of the history and exam documented by Dr. Patel,Vishal and I reviewed pertinent patient test results.  The assessment, diagnosis, and plan were formulated together and I agree with the documentation in the resident's note.  

## 2014-09-06 ENCOUNTER — Encounter: Payer: Self-pay | Admitting: Dietician

## 2014-10-09 ENCOUNTER — Other Ambulatory Visit: Payer: Self-pay | Admitting: Internal Medicine

## 2014-11-24 ENCOUNTER — Encounter: Payer: Self-pay | Admitting: Dietician

## 2014-12-23 ENCOUNTER — Encounter: Payer: Self-pay | Admitting: Internal Medicine

## 2014-12-28 ENCOUNTER — Encounter: Payer: Self-pay | Admitting: Internal Medicine

## 2014-12-28 ENCOUNTER — Encounter: Payer: BLUE CROSS/BLUE SHIELD | Admitting: Internal Medicine

## 2015-02-27 ENCOUNTER — Other Ambulatory Visit: Payer: Self-pay | Admitting: Internal Medicine

## 2015-02-28 NOTE — Telephone Encounter (Signed)
Needs appointment

## 2015-02-28 NOTE — Telephone Encounter (Signed)
Last visit 7/28  A letter has been sent to pt in November but no appointment scheduled. Will send another letter.

## 2015-03-18 ENCOUNTER — Telehealth: Payer: Self-pay | Admitting: Internal Medicine

## 2015-03-18 NOTE — Telephone Encounter (Signed)
APPT. REMINDER CALL, LMTCB IF HE NEEDS TO CANCEL °

## 2015-03-21 ENCOUNTER — Ambulatory Visit (INDEPENDENT_AMBULATORY_CARE_PROVIDER_SITE_OTHER): Payer: BLUE CROSS/BLUE SHIELD | Admitting: Internal Medicine

## 2015-03-21 ENCOUNTER — Encounter: Payer: Self-pay | Admitting: Internal Medicine

## 2015-03-21 VITALS — BP 127/74 | HR 72 | Temp 98.2°F | Ht 74.0 in | Wt 204.7 lb

## 2015-03-21 DIAGNOSIS — E1121 Type 2 diabetes mellitus with diabetic nephropathy: Secondary | ICD-10-CM

## 2015-03-21 DIAGNOSIS — Z7984 Long term (current) use of oral hypoglycemic drugs: Secondary | ICD-10-CM

## 2015-03-21 DIAGNOSIS — E1165 Type 2 diabetes mellitus with hyperglycemia: Secondary | ICD-10-CM | POA: Diagnosis not present

## 2015-03-21 DIAGNOSIS — B3781 Candidal esophagitis: Secondary | ICD-10-CM | POA: Diagnosis not present

## 2015-03-21 DIAGNOSIS — Z23 Encounter for immunization: Secondary | ICD-10-CM

## 2015-03-21 DIAGNOSIS — Z794 Long term (current) use of insulin: Secondary | ICD-10-CM

## 2015-03-21 DIAGNOSIS — IMO0002 Reserved for concepts with insufficient information to code with codable children: Secondary | ICD-10-CM

## 2015-03-21 DIAGNOSIS — IMO0001 Reserved for inherently not codable concepts without codable children: Secondary | ICD-10-CM

## 2015-03-21 DIAGNOSIS — B370 Candidal stomatitis: Secondary | ICD-10-CM

## 2015-03-21 LAB — POCT GLYCOSYLATED HEMOGLOBIN (HGB A1C): Hemoglobin A1C: >14

## 2015-03-21 LAB — GLUCOSE, CAPILLARY: GLUCOSE-CAPILLARY: 523 mg/dL — AB (ref 65–99)

## 2015-03-21 MED ORDER — INSULIN GLARGINE 300 UNIT/ML ~~LOC~~ SOPN: 65.0000 [IU] | PEN_INJECTOR | Freq: Every day | SUBCUTANEOUS | Status: DC

## 2015-03-21 MED ORDER — INSULIN PEN NEEDLE 31G X 5 MM MISC: 1.0000 [IU] | Status: DC

## 2015-03-21 MED ORDER — ACCU-CHEK SOFTCLIX LANCET DEV MISC: Status: DC

## 2015-03-21 MED ORDER — ACCU-CHEK AVIVA DEVI: Status: DC

## 2015-03-21 MED ORDER — GLUCOSE BLOOD VI STRP: ORAL_STRIP | Status: DC

## 2015-03-21 MED ORDER — NYSTATIN 100000 UNIT/GM EX POWD: 4.0000 g | Freq: Three times a day (TID) | CUTANEOUS | Status: DC

## 2015-03-21 MED ORDER — NYSTATIN 100000 UNIT/ML MT SUSP: 5.0000 mL | Freq: Four times a day (QID) | OROMUCOSAL | Status: DC

## 2015-03-21 NOTE — Addendum Note (Signed)
Addended by: Marcelino Duster on: 03/21/2015 12:11 PM   Modules accepted: Orders

## 2015-03-21 NOTE — Assessment & Plan Note (Signed)
HPI: He reports sore throat, irritation with eating, and some sorness swallowing.  A: Given his uncontrolled DM I am concerned for Thrush of mouth and esophagus causing his symptoms.  P: Will treat empirically with Nystatin to see if this improves his symptoms.

## 2015-03-21 NOTE — Progress Notes (Signed)
[facility] Subjective:   Patient ID: [name] male   DOB: 02/03/1965 50 y.o.   MRN: [id number]  HPI: [name] is a 50 y.o. male with a PMH detailed below who presents for follow up of DM.  Please see problem based charting below for the status of his chronic medical problems.    Past Medical History  Diagnosis Date  . Diabetes mellitus   . Hyperlipidemia   . Arthritis     left knee   Current Outpatient Prescriptions  Medication Sig Dispense Refill  . Blood Glucose Monitoring Suppl (ACCU-CHEK AVIVA PLUS) W/DEVICE KIT 1 Device by Does not apply route as directed. Use to test blood glucose 2-3 times daily. Dx: 250.00 1 kit 0  . Blood Glucose Monitoring Suppl (ACCU-CHEK AVIVA) device Use as instructed 1 each 0  . glucose blood (ACCU-CHEK AVIVA) test strip Use as instructed 100 each 12  . Insulin Glargine (TOUJEO SOLOSTAR) 300 UNIT/ML SOPN Inject 65 Units into the skin daily. 5 pen 11  . Insulin Pen Needle 31G X 5 MM MISC 1 Units by Does not apply route as directed. 100 each 11  . Insulin Syringe-Needle U-100 (INSULIN SYRINGE .5CC/31GX5/16") 31G X 5/16" 0.5 ML MISC 1 each by Does not apply route daily. Use as instructed 100 each 3  . Lancet Devices (ACCU-CHEK SOFTCLIX) lancets Use as instructed 1 each 0  . metFORMIN (GLUCOPHAGE) 1000 MG tablet TAKE ONE TABLET BY MOUTH TWICE DAILY WITH MEALS 60 tablet 0  . NOVOLIN 70/30 RELION (70-30) 100 UNIT/ML injection INJECT 20 UNITS SUBCUTANEOUSLY TWICE DAILY WITH MEALS 10 mL 0  . nystatin (MYCOSTATIN/NYSTOP) 100000 UNIT/GM POWD Apply 4 g topically 3 (three) times daily. 60 g 0  . pravastatin (PRAVACHOL) 40 MG tablet TAKE ONE TABLET BY MOUTH ONCE DAILY 30 tablet 0  . sulfamethoxazole-trimethoprim (BACTRIM DS,SEPTRA DS) 800-160 MG per tablet Take 1 tablet by mouth 2 (two) times daily. 25 tablet 0   No current facility-administered medications for this visit.   No family history on file. Social History   Social  History  . Marital Status: Married    Spouse Name: N/A  . Number of Children: N/A  . Years of Education: 12   Occupational History  .      employed at factory   Social History Main Topics  . Smoking status: Never Smoker   . Smokeless tobacco: None  . Alcohol Use: No  . Drug Use: No  . Sexual Activity: Not Asked     Comment: immigrated from [place], speaks arabic, married   Other Topics Concern  . None   Social History Narrative   Review of Systems: Review of Systems  Constitutional: Negative for fever, chills, weight loss and malaise/fatigue.  Gastrointestinal: Positive for nausea, vomiting and abdominal pain. Negative for constipation.  Genitourinary: Positive for frequency.  Endo/Heme/Allergies: Positive for polydipsia.     Objective:  Physical Exam: Filed Vitals:   03/21/15 1027  BP: 127/74  Pulse: 72  Temp: 98.2 F (36.8 C)  TempSrc: Oral  Height: 6' 2" (1.88 m)  Weight: 204 lb 11.2 oz (92.851 kg)  SpO2: 100%  Physical Exam  Constitutional: He is well-developed, well-nourished, and in no distress.  HENT:  Mouth/Throat: Oropharynx is clear and moist.  Cardiovascular: Normal rate and regular rhythm.   Pulmonary/Chest: Effort normal and breath sounds normal.  Abdominal: Soft. Bowel sounds are normal.  Nursing note and vitals reviewed.   Assessment & Plan:  Case discussed with   Dr. Mullen  Thrush of mouth and esophagus (HCC) HPI: He reports sore throat, irritation with eating, and some sorness swallowing.  A: Given his uncontrolled DM I am concerned for Thrush of mouth and esophagus causing his symptoms.  P: Will treat empirically with Nystatin to see if this improves his symptoms.  Uncontrolled type II diabetes mellitus (HCC) HPI: He has been taking Metformin 1G bid, as well as 30units of Novolin 70/30 BID,  He reports that he does not miss doses.  He has not been checking his sugars as he ran out of testing supplies but also feels that his meter is  broken.  A: Uncontrolled type 2 DM. P: -I cannot see in his records that he has any complications of diabetes we should be more aggressive in his diabetic treatment.  I will change him from Novolin 70/30 to Toujeo 65 units QHS.  I have provided him with a card that should bring down his cost to 10 dollars a month for the next year, - I have provided him with a new meter and testing supplies, I want him to test 3 times a day for the next 2 weeks and return with the meter for review and insulin adjustment. -Needs BMP at next visit in 2 weeks    Medications Ordered Meds ordered this encounter  Medications  . Insulin Glargine (TOUJEO SOLOSTAR) 300 UNIT/ML SOPN    Sig: Inject 65 Units into the skin daily.    Dispense:  5 pen    Refill:  11  . Insulin Pen Needle 31G X 5 MM MISC    Sig: 1 Units by Does not apply route as directed.    Dispense:  100 each    Refill:  11  . glucose blood (ACCU-CHEK AVIVA) test strip    Sig: Use as instructed    Dispense:  100 each    Refill:  12    The patient is insulin requiring, ICD 10 code E11.65. The patient tests 3 times per day.  . Lancet Devices (ACCU-CHEK SOFTCLIX) lancets    Sig: Use as instructed    Dispense:  1 each    Refill:  0    The patient is insulin requiring, ICD 10 code E11.65. The patient tests 3 times per day.  . Blood Glucose Monitoring Suppl (ACCU-CHEK AVIVA) device    Sig: Use as instructed    Dispense:  1 each    Refill:  0  . nystatin (MYCOSTATIN/NYSTOP) 100000 UNIT/GM POWD    Sig: Apply 4 g topically 3 (three) times daily.    Dispense:  60 g    Refill:  0   Other Orders Orders Placed This Encounter  Procedures  . Glucose, capillary  . POCT HgB A1C (CPT 83036)   Follow Up: Return in about 2 weeks (around 04/04/2015).   

## 2015-03-21 NOTE — Patient Instructions (Signed)
I want you to change insulins to Toujeo, inject 65 units each day.  General Instructions:   Please bring your medicines with you each time you come to clinic.  Medicines may include prescription medications, over-the-counter medications, herbal remedies, eye drops, vitamins, or other pills.   Progress Toward Treatment Goals:  Treatment Goal 02/09/2014  Hemoglobin A1C deteriorated    Self Care Goals & Plans:  Self Care Goal 08/23/2014  Manage my medications take my medicines as prescribed; bring my medications to every visit  Monitor my health keep track of my blood glucose; bring my glucose meter and log to each visit; keep track of my weight; check my feet daily  Eat healthy foods eat foods that are low in salt; eat baked foods instead of fried foods; drink diet soda or water instead of juice or soda  Be physically active find an activity I enjoy    Home Blood Glucose Monitoring 02/09/2014  Check my blood sugar 3 times a day  When to check my blood sugar before meals     Care Management & Community Referrals:  No flowsheet data found.

## 2015-03-21 NOTE — Assessment & Plan Note (Addendum)
HPI: He has been taking Metformin 1G bid, as well as 30units of Novolin 70/30 BID,  He reports that he does not miss doses.  He has not been checking his sugars as he ran out of testing supplies but also feels that his meter is broken.  A: Uncontrolled type 2 DM. P: -I cannot see in his records that he has any complications of diabetes we should be more aggressive in his diabetic treatment.  I will change him from Novolin 70/30 to Toujeo 65 units QHS.  I have provided him with a card that should bring down his cost to 10 dollars a month for the next year, - I have provided him with a new meter and testing supplies, I want him to test 3 times a day for the next 2 weeks and return with the meter for review and insulin adjustment. -Needs BMP at next visit in 2 weeks

## 2015-03-22 ENCOUNTER — Other Ambulatory Visit: Payer: Self-pay

## 2015-03-22 DIAGNOSIS — Z794 Long term (current) use of insulin: Principal | ICD-10-CM

## 2015-03-22 DIAGNOSIS — E1165 Type 2 diabetes mellitus with hyperglycemia: Principal | ICD-10-CM

## 2015-03-22 DIAGNOSIS — IMO0001 Reserved for inherently not codable concepts without codable children: Secondary | ICD-10-CM

## 2015-03-22 MED ORDER — ONETOUCH DELICA LANCETS FINE MISC: Status: DC

## 2015-03-22 MED ORDER — GLUCOSE BLOOD VI STRP: ORAL_STRIP | Status: DC

## 2015-03-22 NOTE — Telephone Encounter (Signed)
rec'd note from pharmacy that the accu-chek aviva is not covered by the patients insurance.  Can you send  Updated script for meter and supplies?

## 2015-03-22 NOTE — Telephone Encounter (Signed)
Patient insurance status unknown, called [facility]. They say they do not know what meter his insurance covers. They advised we call: [facility] [phone]. It appears that onet[employer] products are preferred. They cost 27$ at retail and they are free if he uses [facility] mail order. A free mail order meter was ordered on his behalf and should arrive in 7 business days.

## 2015-03-22 NOTE — Progress Notes (Signed)
Internal Medicine Clinic Attending  Case discussed with Dr. Hoffman soon after the resident saw the patient.  We reviewed the resident's history and exam and pertinent patient test results.  I agree with the assessment, diagnosis, and plan of care documented in the resident's note. 

## 2015-03-23 ENCOUNTER — Other Ambulatory Visit: Payer: Self-pay

## 2015-03-23 MED ORDER — METFORMIN HCL 1000 MG PO TABS: 1000.0000 mg | ORAL_TABLET | Freq: Two times a day (BID) | ORAL | Status: DC

## 2015-03-23 NOTE — Telephone Encounter (Signed)
Pt as been out of metformin 2 days according to spouse.

## 2015-07-18 ENCOUNTER — Encounter: Payer: Self-pay | Admitting: Internal Medicine

## 2015-07-18 ENCOUNTER — Ambulatory Visit (INDEPENDENT_AMBULATORY_CARE_PROVIDER_SITE_OTHER): Payer: BLUE CROSS/BLUE SHIELD | Admitting: Internal Medicine

## 2015-07-18 VITALS — BP 130/76 | HR 62 | Temp 98.1°F | Ht 72.0 in | Wt 197.5 lb

## 2015-07-18 DIAGNOSIS — B3781 Candidal esophagitis: Secondary | ICD-10-CM | POA: Diagnosis not present

## 2015-07-18 DIAGNOSIS — E1165 Type 2 diabetes mellitus with hyperglycemia: Secondary | ICD-10-CM

## 2015-07-18 DIAGNOSIS — B370 Candidal stomatitis: Secondary | ICD-10-CM

## 2015-07-18 DIAGNOSIS — IMO0001 Reserved for inherently not codable concepts without codable children: Secondary | ICD-10-CM

## 2015-07-18 DIAGNOSIS — Z794 Long term (current) use of insulin: Secondary | ICD-10-CM | POA: Diagnosis not present

## 2015-07-18 DIAGNOSIS — M79675 Pain in left toe(s): Secondary | ICD-10-CM | POA: Diagnosis not present

## 2015-07-18 LAB — GLUCOSE, CAPILLARY: Glucose-Capillary: 356 mg/dL — ABNORMAL HIGH (ref 65–99)

## 2015-07-18 LAB — HM DIABETES EYE EXAM

## 2015-07-18 LAB — POCT GLYCOSYLATED HEMOGLOBIN (HGB A1C)

## 2015-07-18 MED ORDER — LANCETS ULTRA THIN 30G MISC: Status: DC

## 2015-07-18 MED ORDER — GLUCOSE BLOOD VI STRP: ORAL_STRIP | Status: DC

## 2015-07-18 NOTE — Assessment & Plan Note (Signed)
His diabetes remains uncontrolled. It is likely related in some degree to the language barrier. He noted no trouble getting his medications, but I do not think he understands the severity of his disease. I spent a significant amount of time explaining the need for [place] to work together to get his blood sugars down due to the numerous side effects (risk of MI, stroke, peripheral vascular disease, retinopathy, nephropathy, etc) of continued hyperglycemia. We reviewed his medications, including the doses and time of day to take them. I will have him follow up in 2 weeks so that we can reassess his blood sugars. I gave him a [employer] glucometer today and advised him to fill his test strips prescription.  - Continue Toujeo 65 units daily - Continue Metformin 1000 mg BID - Eye exam today - Referral to [name] for nutrition/DM education - bmet today to assess kidney function - Can consider ACE inhibitor in future but did not want to overwhelm him today with new information

## 2015-07-18 NOTE — Assessment & Plan Note (Signed)
Symptoms resolved. No thrush on exam.

## 2015-07-18 NOTE — Progress Notes (Signed)
   Subjective:    Patient ID: [name], male    DOB: March 19, 1965, 50 y.o.   MRN: [id number]  HPI Mr. Grondahl is a 50yo man with PMHx of type 2 DM and hyperlipidemia who presents today for follow up of his diabetes. An Arabic interpreter was used to facilitate the conversation.  Type 2 DM: His last A1c was >14.0 and today remains at this level. He is prescribed Toujeo 65 units daily and Metformin 1000 mg BID. However, he reports taking 35 units BID of "the green pen." He denies any difficulty getting his medications. When asked "what is difficult for you for your diabetes?", he tells me that nothing is difficult. He notes whenever his blood sugar gets under 200 he starts to feel dizzy. He reports he lost his glucometer as he had moved recently. He has not been checking his blood sugars.   Oral Thrush: At his last visit he had complained of a sore throat and irritation with eating and was empirically treated for oral/esophageal thrush with nystatin. He reports today that the nystatin helped and these symptoms have resolved.   Left 2nd Toe Pain: Reports for the last 1 month he has experienced left 2nd toe pain. He notes he had an injury to his toe a few months ago related to his work boots but that now the injury has healed. He does note his toe is more black. He denies any purulence or drainage.    Review of Systems General: Denies fever, chills, night sweats, changes in weight, changes in appetite HEENT: Denies headaches, ear pain, changes in vision, rhinorrhea CV: Denies CP, palpitations, SOB, orthopnea Pulm: Denies SOB, cough, wheezing GI: Denies abdominal pain, nausea, vomiting, diarrhea, constipation, melena, hematochezia GU: Denies dysuria, hematuria, frequency Msk: Denies muscle cramps, joint pains Neuro: Denies weakness, numbness, tingling Skin: Denies rashes, bruising Psych: Denies depression, anxiety, hallucinations    Objective:   Physical Exam General: alert, sitting up, NAD,  pleasant  HEENT: [place]/AT, EOMI, sclera anicteric, pharynx non-erythematous, no oral thrush, mucus membranes moist CV: RRR, no m/g/r Pulm: CTA bilaterally, breaths non-labored Abd: BS+, soft, non-tender Ext: warm, trace pitting peripheral edema bilaterally, distal pulses 1+ Neuro: alert and oriented x 3, sensation symmetric and intact to pinprick and light touch Skin: There is a healed ulcer on the plantar aspect of his left 2nd toe. No purulence or drainage.      Assessment & Plan:  Please refer to A&P documentation.

## 2015-07-18 NOTE — Patient Instructions (Signed)
General Instructions: - Bring your glucometer and medications with you to your next visit - Start taking Toujeo (green pen) 65 units daily - Test your blood sugars 3-4 times times a day BEFORE meals - Next visit in 2 weeks - Eye exam today - Referral to podiatry - Refills made for your medications  Please bring your medicines with you each time you come to clinic.  Medicines may include prescription medications, over-the-counter medications, herbal remedies, eye drops, vitamins, or other pills.   Progress Toward Treatment Goals:  Treatment Goal 02/09/2014  Hemoglobin A1C deteriorated    Self Care Goals & Plans:  Self Care Goal 08/23/2014  Manage my medications take my medicines as prescribed; bring my medications to every visit  Monitor my health keep track of my blood glucose; bring my glucose meter and log to each visit; keep track of my weight; check my feet daily  Eat healthy foods eat foods that are low in salt; eat baked foods instead of fried foods; drink diet soda or water instead of juice or soda  Be physically active find an activity I enjoy    Home Blood Glucose Monitoring 02/09/2014  Check my blood sugar 3 times a day  When to check my blood sugar before meals     Care Management & Community Referrals:  No flowsheet data found.

## 2015-07-18 NOTE — Assessment & Plan Note (Signed)
He has a healed ulcer on his 2nd left toe. I suspect he is starting to get some peripheral neuropathy related to his uncontrolled diabetes. He states the pain does not bother him that much. We discussed having him see Podiatry and foot care. Referral placed to Podiatry.

## 2015-07-19 LAB — BMP8+ANION GAP
Anion Gap: 20 mmol/L — ABNORMAL HIGH (ref 10.0–18.0)
BUN/Creatinine Ratio: 18 (ref 9–20)
BUN: 17 mg/dL (ref 6–24)
CALCIUM: 9.3 mg/dL (ref 8.7–10.2)
CHLORIDE: 99 mmol/L (ref 96–106)
CO2: 20 mmol/L (ref 18–29)
Creatinine, Ser: 0.93 mg/dL (ref 0.76–1.27)
GFR, EST AFRICAN AMERICAN: 110 mL/min/{1.73_m2} (ref >59)
GFR, EST NON AFRICAN AMERICAN: 95 mL/min/{1.73_m2} (ref >59)
Glucose: 388 mg/dL — ABNORMAL HIGH (ref 65–99)
Potassium: 4.9 mmol/L (ref 3.5–5.2)
Sodium: 139 mmol/L (ref 134–144)

## 2015-07-19 NOTE — Progress Notes (Signed)
Internal Medicine Clinic Attending  Case discussed with Dr. Rivet soon after the resident saw the patient.  We reviewed the resident's history and exam and pertinent patient test results.  I agree with the assessment, diagnosis, and plan of care documented in the resident's note.  

## 2015-07-26 ENCOUNTER — Ambulatory Visit: Payer: BLUE CROSS/BLUE SHIELD | Admitting: Dietician

## 2015-08-01 ENCOUNTER — Encounter: Payer: Self-pay | Admitting: Dietician

## 2015-08-12 ENCOUNTER — Ambulatory Visit: Payer: BLUE CROSS/BLUE SHIELD | Admitting: Podiatry

## 2015-08-12 NOTE — Addendum Note (Signed)
Addended by: Hulan Fray on: 08/12/2015 07:16 PM   Modules accepted: Orders

## 2015-08-29 ENCOUNTER — Ambulatory Visit: Payer: BLUE CROSS/BLUE SHIELD

## 2015-08-29 ENCOUNTER — Ambulatory Visit (INDEPENDENT_AMBULATORY_CARE_PROVIDER_SITE_OTHER): Payer: BLUE CROSS/BLUE SHIELD | Admitting: Internal Medicine

## 2015-08-29 ENCOUNTER — Encounter: Payer: Self-pay | Admitting: Internal Medicine

## 2015-08-29 DIAGNOSIS — M79671 Pain in right foot: Secondary | ICD-10-CM | POA: Diagnosis not present

## 2015-08-29 DIAGNOSIS — IMO0001 Reserved for inherently not codable concepts without codable children: Secondary | ICD-10-CM

## 2015-08-29 DIAGNOSIS — Z794 Long term (current) use of insulin: Secondary | ICD-10-CM | POA: Diagnosis not present

## 2015-08-29 DIAGNOSIS — E1165 Type 2 diabetes mellitus with hyperglycemia: Secondary | ICD-10-CM | POA: Diagnosis not present

## 2015-08-29 NOTE — Patient Instructions (Signed)
Thank you for coming to see me today. It was a pleasure. Today we talked about:   Foot Problems: - I think this is due to your poorly controlled Diabetes.  I have rescheduled you for your foot doctor appointment. - Please come back in 2 weeks to see [place] with your glucose meter. - Please look at your feet everyday.  If things are getting worse, please let us know.  Please follow-up with [place] in 2 weeks.  If you have any questions or concerns, please do not hesitate to call the office at [phone].  Take Care,   Jule Ser, DO

## 2015-08-29 NOTE — Assessment & Plan Note (Signed)
A: Suspect that he is developing insensate neuropathy from his poorly controlled diabetes leading to the early stages of a potential diabetic foot ulcer.  He has no concerning signs or symptoms currently for infection and these lesions on his right foot are not draining nor open.   P: - we were able to get him in to be seen on August 18 by podiatry for continued foot care. - we discussed the importance of glycemic control. - I asked him to keep his feet clean and to attempt off loading pressure as much as possible.  I also asked him and his son to look at his feet daily. - I asked them to RTC if they notice pain or worsening of the blisters or if they start draining. - could consider ABI's in the future if concern for peripheral vascular disease

## 2015-08-29 NOTE — Progress Notes (Signed)
Internal Medicine Clinic Attending  Case discussed with Dr. Wallace at the time of the visit.  We reviewed the resident's history and exam and pertinent patient test results.  I agree with the assessment, diagnosis, and plan of care documented in the resident's note.  

## 2015-08-29 NOTE — Progress Notes (Signed)
CC: here for blisters on right foot  HPI:  [name] is a 50 y.o. man with PMHx as detailed below who presents today due to "red blisters" on his right great toe.  States that he first noticed this morning and thinks it may be related to the shoes he wears for work and due to standing on his feet for many hours at a time.  The area of concern has not been draining any fluid or been an open wound that he is aware of.  It is not painful just abnormal appearing, but he does describe a "pins and needles" feeling in his bilateral feet.   He was seen in [facility] on 07/18/15 by his PCP who noted a healed ulcer on his 2nd left toe.  He was referred to podiatry at that time but unfortunately missed his new patient appointment.   Past Medical History:  Diagnosis Date  . Arthritis    left knee  . Diabetes mellitus   . Hyperlipidemia     Review of Systems:   Review of Systems  Constitutional: Negative for chills and fever.  Skin:       Blisters on toes of right foot  Neurological: Positive for tingling.     Physical Exam:  Vitals:   08/29/15 1147  BP: 108/67  Temp: 98.4 F (36.9 C)  TempSrc: Oral  Weight: 194 lb 11.2 oz (88.3 kg)   Physical Exam  Constitutional: He is oriented to person, place, and time and well-developed, well-nourished, and in no distress.  HENT:  Head: Normocephalic and atraumatic.  Eyes: EOM are normal.  Neurological: He is alert and oriented to person, place, and time.  Skin:  Sensation symmetric to light touch bilaterally. There is a healed ulcer on the plantar aspect of his left 2nd toe. No purulence or drainage.  There is a non-draining, closed blister-like lesion on his right great toe as well as 2nd and 3rd toes.  No fluctuance.  DP and PT pulses are palbable.   Psychiatric: Mood and affect normal.    Assessment & Plan:   See Encounters Tab for problem based charting.   Patient discussed with Dr. Daryll Drown   Right foot pain A: Suspect that he is  developing insensate neuropathy from his poorly controlled diabetes leading to the early stages of a potential diabetic foot ulcer.  He has no concerning signs or symptoms currently for infection and these lesions on his right foot are not draining nor open.   P: - we were able to get him in to be seen on August 18 by podiatry for continued foot care. - we discussed the importance of glycemic control. - I asked him to keep his feet clean and to attempt off loading pressure as much as possible.  I also asked him and his son to look at his feet daily. - I asked them to RTC if they notice pain or worsening of the blisters or if they start draining. - could consider ABI's in the future if concern for peripheral vascular disease  Uncontrolled type II diabetes mellitus ([place]) A:  During his last visit, he was asked to follow up in 2 weeks with his glucometer but he did not do so.  He did not bring his meter today as he is here to discuss his foot care.  He reports taking his Toujeo daily and Metformin BID.  P: - asked patient to follow up again in 2 weeks with his meter so we can  reassess his glycemic control as well as ensure the pressure blisters on his right foot are not getting worse.

## 2015-08-29 NOTE — Assessment & Plan Note (Signed)
A:  During his last visit, he was asked to follow up in 2 weeks with his glucometer but he did not do so.  He did not bring his meter today as he is here to discuss his foot care.  He reports taking his Toujeo daily and Metformin BID.  P: - asked patient to follow up again in 2 weeks with his meter so we can reassess his glycemic control as well as ensure the pressure blisters on his right foot are not getting worse.

## 2015-09-05 ENCOUNTER — Ambulatory Visit: Payer: BLUE CROSS/BLUE SHIELD

## 2015-09-05 ENCOUNTER — Encounter: Payer: Self-pay | Admitting: Internal Medicine

## 2015-09-09 ENCOUNTER — Other Ambulatory Visit: Payer: Self-pay | Admitting: *Deleted

## 2015-09-09 MED ORDER — METFORMIN HCL 1000 MG PO TABS: 1000.0000 mg | ORAL_TABLET | Freq: Two times a day (BID) | ORAL | 0 refills | Status: DC

## 2015-09-09 NOTE — Telephone Encounter (Signed)
Discussed with daughter that patient needs to have an appointment ASAP as he missed his last appointment for DM recheck. She will schedule for him.

## 2015-09-16 ENCOUNTER — Encounter: Payer: Self-pay | Admitting: Podiatry

## 2015-09-16 ENCOUNTER — Ambulatory Visit (INDEPENDENT_AMBULATORY_CARE_PROVIDER_SITE_OTHER): Payer: BLUE CROSS/BLUE SHIELD | Admitting: Podiatry

## 2015-09-16 DIAGNOSIS — R238 Other skin changes: Secondary | ICD-10-CM

## 2015-09-16 DIAGNOSIS — L988 Other specified disorders of the skin and subcutaneous tissue: Secondary | ICD-10-CM

## 2015-09-16 DIAGNOSIS — L84 Corns and callosities: Secondary | ICD-10-CM | POA: Diagnosis not present

## 2015-09-16 NOTE — Progress Notes (Signed)
   Subjective:    Patient ID: [name], male    DOB: 1965-07-27, 50 y.o.   MRN: [id number]  HPI  50 year old male presents the office today with concerns of dark spot the tip of his toes which is been ongoing the last several weeks. He states he is wearing shoes that were too small and afterwards he noticed blood spots the tip of his toes. Denies any drainage or swelling or any redness or warmth. Denies any pain. Denies any claudication symptoms. No other complaints at this time.   Review of Systems  All other systems reviewed and are negative.      Objective:   Physical Exam General: AAO x3, NAD  Dermatological: At the distal aspect of bilateral hallux, second, third digit is what appears to be dried hemorrhagic bulla that is starting to callus over. There is no drainage or pus. No fluctuance or crepitus. There is no malodor. No other open sores or pre-ulcer lesions identified today.  Vascular: Dorsalis Pedis artery and Posterior Tibial artery pedal pulses are 2/4 bilateral with immedate capillary fill time. Pedal hair growth present.  There is no pain with calf compression, swelling, warmth, erythema.   Neruologic:  Sensation appears to be decreased with Derrel Nip monofilament.  Musculoskeletal: No gross boney pedal deformities bilateral. No pain, crepitus, or limitation noted with foot and ankle range of motion bilateral. Muscular strength 5/5 in all groups tested bilateral.  Gait: Unassisted, Nonantalgic.      Assessment & Plan:  50 year old male hemorrhagic bulla bilateral toes due to pressure -Treatment options discussed including all alternatives, risks, and complications -Etiology of symptoms were discussed. This is likely due to pressure. It does not appear to be ischemic. -Lesions of dried and starting the callus. Recommend a small and Neosporin to the area as well as change in his shoes. Offloading pads were dispensed. Follow-up as scheduled or sooner if needed.  Monitor for signs or symptoms of infection.  Celesta Gentile, DPM

## 2015-09-26 ENCOUNTER — Telehealth: Payer: Self-pay | Admitting: Internal Medicine

## 2015-09-26 NOTE — Telephone Encounter (Signed)
APT. REMINDER CALL, PHONE IS OFF

## 2015-09-27 ENCOUNTER — Encounter: Payer: Self-pay | Admitting: Internal Medicine

## 2015-09-27 ENCOUNTER — Encounter: Payer: BLUE CROSS/BLUE SHIELD | Admitting: Internal Medicine

## 2015-09-30 ENCOUNTER — Ambulatory Visit: Payer: BLUE CROSS/BLUE SHIELD | Admitting: Podiatry

## 2015-12-06 ENCOUNTER — Encounter: Payer: BLUE CROSS/BLUE SHIELD | Admitting: Internal Medicine

## 2016-02-14 ENCOUNTER — Encounter: Payer: Self-pay | Admitting: Internal Medicine

## 2016-02-14 ENCOUNTER — Ambulatory Visit (INDEPENDENT_AMBULATORY_CARE_PROVIDER_SITE_OTHER): Payer: BLUE CROSS/BLUE SHIELD | Admitting: Internal Medicine

## 2016-02-14 VITALS — BP 136/83 | HR 71 | Temp 98.0°F | Ht 72.0 in | Wt 195.8 lb

## 2016-02-14 DIAGNOSIS — Z9114 Patient's other noncompliance with medication regimen: Secondary | ICD-10-CM

## 2016-02-14 DIAGNOSIS — IMO0001 Reserved for inherently not codable concepts without codable children: Secondary | ICD-10-CM

## 2016-02-14 DIAGNOSIS — Z794 Long term (current) use of insulin: Secondary | ICD-10-CM

## 2016-02-14 DIAGNOSIS — E1140 Type 2 diabetes mellitus with diabetic neuropathy, unspecified: Secondary | ICD-10-CM | POA: Diagnosis not present

## 2016-02-14 DIAGNOSIS — E1165 Type 2 diabetes mellitus with hyperglycemia: Secondary | ICD-10-CM

## 2016-02-14 DIAGNOSIS — Z79899 Other long term (current) drug therapy: Secondary | ICD-10-CM

## 2016-02-14 LAB — GLUCOSE, CAPILLARY: Glucose-Capillary: 515 mg/dL (ref 65–99)

## 2016-02-14 LAB — POCT GLYCOSYLATED HEMOGLOBIN (HGB A1C)

## 2016-02-14 MED ORDER — GLUCOSE BLOOD VI STRP: ORAL_STRIP | 12 refills | Status: DC

## 2016-02-14 MED ORDER — INSULIN GLARGINE 300 UNIT/ML ~~LOC~~ SOPN: 65.0000 [IU] | PEN_INJECTOR | Freq: Every day | SUBCUTANEOUS | 11 refills | Status: DC

## 2016-02-14 MED ORDER — METFORMIN HCL ER 500 MG PO TB24: 500.0000 mg | ORAL_TABLET | Freq: Every day | ORAL | 11 refills | Status: DC

## 2016-02-14 MED ORDER — INSULIN PEN NEEDLE 31G X 5 MM MISC: 1.0000 [IU] | 11 refills | Status: DC

## 2016-02-14 NOTE — Progress Notes (Signed)
CC: DM f/u HPI: [name] is a 51 y.o. male with a h/o of DM, HLD, arthitis who presents for management of his uncontrolled t2DM.  Please see Problem-based charting for HPI and the status of patient's chronic medical conditions.  Past Medical History:  Diagnosis Date  . Arthritis    left knee  . Diabetes mellitus   . Hyperlipidemia    Social History  Substance Use Topics  . Smoking status: Never Smoker  . Smokeless tobacco: Not on file  . Alcohol use No   Family Hx: Pt denies any pertinent family hx.  Review of Systems: ROS in HPI. Otherwise: Review of Systems  Constitutional: Negative for chills, fever and weight loss.  Respiratory: Negative for cough and shortness of breath.   Cardiovascular: Negative for chest pain and leg swelling.  Gastrointestinal: Negative for abdominal pain, constipation, diarrhea, nausea and vomiting.  Genitourinary: Positive for frequency. Negative for dysuria and urgency.   Physical Exam: Vitals:   02/14/16 1511  BP: 136/83  Pulse: 71  Temp: 98 F (36.7 C)  TempSrc: Oral  SpO2: 100%  Weight: 195 lb 12.8 oz (88.8 kg)  Height: 6' (1.829 m)   Physical Exam  Constitutional: He is cooperative. No distress.  Cardiovascular: Normal rate, regular rhythm, normal heart sounds and normal pulses.  Exam reveals no gallop.   No murmur heard. Pulmonary/Chest: Effort normal and breath sounds normal. No respiratory distress. He has no wheezes. He has no rhonchi. He has no rales.  Abdominal: Soft. Bowel sounds are normal. There is no tenderness.  Musculoskeletal: He exhibits no edema.  Skin: Skin is intact.    Assessment & Plan:  See encounters tab for problem based medical decision making. Patient discussed with Dr. Eppie Gibson  Uncontrolled type II diabetes mellitus [facility]) Patient presents for refills of his insulin. He reports that since the turn of the new year he has been unable to get his insulin filled because his coupon card is no longer  valid. He has been without his insulin for 10 days. Prior to running out of his insulin he was checking his blood glucose every 3-4 days at random times and reports that his readings were always in the 200s and 300s. Patient denies any polydipsia or polyuria while on his insulin, but notes that since discontinuing he has been urinating significantly more frequent. He denies any changes to vision, headaches, chest pain, anxiety or tremors.  Patient also reports that he has been taking 1000 mg metformin twice a day. He reports that on this regimen he has had 2 episodes of diarrhea daily. Since discontinuing metformin yesterday he reports the diarrhea has resolved. He does not have significant abdominal pain. But feels that the diarrhea is associated with his medication use.  Point-of-care blood glucose today reveals significant hyperglycemia at 515. A1c is greater than 14. He denies any other symptoms or signs of diabetic ketoacidosis or HHS.   Plan: Urinalysis to look for ketones and protein. BMP to assess for subclinical signs of ketoacidosis and to assess kidney function. Switch from instant release metformin to extended release metformin, starting at 500 mg daily with plan to titrate up to thousand milligrams after one week if tolerating. Refill sent for blood glucose test strips with significant counseling about the importance of checking blood glucoses 3 times daily. Coupon card for insulin given today to qualify the patient for 10 hour co-pays as previously. Return for follow-up in one week with blood glucose meter to titrate insulin  as appropriate.    Neuropathy due to type 2 diabetes mellitus [facility]) Patient has a history of bilateral neuropathy with noted sores on the toes seen by podiatry last August. Patient reports that these symptoms have resolved except for some small residual numbness. He has no sores on exam today.  Plan: We will plan to continue to monitor symptoms and routine foot  exam. No medication therapy warranted for neuropathy at this time. Continue aggressive control of blood glucose.   Signed: Holley Raring, MD 02/14/2016, 4:21 PM  Pager: [phone]

## 2016-02-14 NOTE — Assessment & Plan Note (Signed)
Patient presents for refills of his insulin. He reports that since the turn of the new year he has been unable to get his insulin filled because his coupon card is no longer valid. He has been without his insulin for 10 days. Prior to running out of his insulin he was checking his blood glucose every 3-4 days at random times and reports that his readings were always in the 200s and 300s. Patient denies any polydipsia or polyuria while on his insulin, but notes that since discontinuing he has been urinating significantly more frequent. He denies any changes to vision, headaches, chest pain, anxiety or tremors.  Patient also reports that he has been taking 1000 mg metformin twice a day. He reports that on this regimen he has had 2 episodes of diarrhea daily. Since discontinuing metformin yesterday he reports the diarrhea has resolved. He does not have significant abdominal pain. But feels that the diarrhea is associated with his medication use.  Point-of-care blood glucose today reveals significant hyperglycemia at 515. A1c is greater than 14. He denies any other symptoms or signs of diabetic ketoacidosis or HHS.   Plan: Urinalysis to look for ketones and protein. BMP to assess for subclinical signs of ketoacidosis and to assess kidney function. Switch from instant release metformin to extended release metformin, starting at 500 mg daily with plan to titrate up to thousand milligrams after one week if tolerating. Refill sent for blood glucose test strips with significant counseling about the importance of checking blood glucoses 3 times daily. Coupon card for insulin given today to qualify the patient for 10 hour co-pays as previously. Return for follow-up in one week with blood glucose meter to titrate insulin as appropriate.

## 2016-02-14 NOTE — Assessment & Plan Note (Signed)
Patient has a history of bilateral neuropathy with noted sores on the toes seen by podiatry last August. Patient reports that these symptoms have resolved except for some small residual numbness. He has no sores on exam today.  Plan: We will plan to continue to monitor symptoms and routine foot exam. No medication therapy warranted for neuropathy at this time. Continue aggressive control of blood glucose.

## 2016-02-14 NOTE — Patient Instructions (Signed)
Please take your insulins every day. We have provided a new coupon card to make this more affordable. He should also remember to check your blood sugars with your test meter at least 3 times every day. We have sent in a refill for your blood sugar meter and test strips today. Please bring his meter with you to your next visit.  We have also changed her metformin to the extended release form in hopes that this will help with your symptoms of diarrhea. We will reassess how you're doing at your follow-up visit.

## 2016-02-15 ENCOUNTER — Telehealth: Payer: Self-pay | Admitting: Internal Medicine

## 2016-02-15 LAB — URINALYSIS, COMPLETE
BILIRUBIN UA: NEGATIVE
KETONES UA: NEGATIVE
Leukocytes, UA: NEGATIVE
NITRITE UA: NEGATIVE
PROTEIN UA: NEGATIVE
RBC, UA: NEGATIVE
SPEC GRAV UA: 1.025 (ref 1.005–1.030)
Urobilinogen, Ur: 0.2 mg/dL (ref 0.2–1.0)
pH, UA: 5 (ref 5.0–7.5)

## 2016-02-15 LAB — BMP8+ANION GAP
Anion Gap: 16 mmol/L (ref 10.0–18.0)
BUN / CREAT RATIO: 20 (ref 9–20)
BUN: 18 mg/dL (ref 6–24)
CO2: 22 mmol/L (ref 18–29)
CREATININE: 0.91 mg/dL (ref 0.76–1.27)
Calcium: 9.2 mg/dL (ref 8.7–10.2)
Chloride: 99 mmol/L (ref 96–106)
GFR calc non Af Amer: 97 mL/min/{1.73_m2} (ref >59)
GFR, EST AFRICAN AMERICAN: 112 mL/min/{1.73_m2} (ref >59)
Glucose: 550 mg/dL (ref 65–99)
Potassium: 5.1 mmol/L (ref 3.5–5.2)
SODIUM: 137 mmol/L (ref 134–144)

## 2016-02-15 LAB — MICROSCOPIC EXAMINATION
Casts: NONE SEEN /lpf
RBC, UA: NONE SEEN /hpf (ref 0–?)

## 2016-02-15 NOTE — Telephone Encounter (Signed)
   Reason for call:   I received a call from [employer] [phone]) at 4:43 AM stating patient's glucose is 550 on labs collected 02/14/16. Labs were ordered by Dr. Gay Filler.    Pertinent Data:   Glucose 550 on BMP collected 02/14/16. Results have not yet been posted to epic.  Bicarb 22, anion gap 16, BUN 18, SCr 0.91, Na 137, K 5.1, Chloride 99, and Calcium 9.2 per [facility] representative.   Patient was seen by Dr. Gay Filler on 02/14/16 and noted to have a CBG of 515 at that time. Per documentation from Dr. Gay Filler, patient did not have any symptoms or signs of DKA or HHS. BMP was ordered to look for subclinical signs of DKA and to assess renal function. Please refer to his office note for details.    Assessment / Plan / Recommendations:   Based on lab results, DKA less likely (normal bicarb and normal anion gap). In addition, patient's renal function seems to be stable in comparison to BMP from 07/18/2015.   I will forward this message to Dr. Gay Filler and patient's PCP Dr. Arcelia Jew.    Shela Leff, MD   02/15/2016, 4:57 AM

## 2016-02-17 NOTE — Progress Notes (Signed)
Case discussed with Dr. Strelow at the time of the visit. We reviewed the resident's history and exam and pertinent patient test results. I agree with the assessment, diagnosis, and plan of care documented in the resident's note. 

## 2016-02-20 ENCOUNTER — Ambulatory Visit (INDEPENDENT_AMBULATORY_CARE_PROVIDER_SITE_OTHER): Payer: BLUE CROSS/BLUE SHIELD | Admitting: Internal Medicine

## 2016-02-20 ENCOUNTER — Encounter: Payer: Self-pay | Admitting: Internal Medicine

## 2016-02-20 VITALS — BP 141/76 | HR 65 | Temp 98.4°F | Ht 72.0 in | Wt 200.6 lb

## 2016-02-20 DIAGNOSIS — E1165 Type 2 diabetes mellitus with hyperglycemia: Secondary | ICD-10-CM | POA: Diagnosis not present

## 2016-02-20 DIAGNOSIS — Z794 Long term (current) use of insulin: Secondary | ICD-10-CM | POA: Diagnosis not present

## 2016-02-20 DIAGNOSIS — IMO0001 Reserved for inherently not codable concepts without codable children: Secondary | ICD-10-CM

## 2016-02-20 MED ORDER — INSULIN GLARGINE 300 UNIT/ML ~~LOC~~ SOPN: 70.0000 [IU] | PEN_INJECTOR | Freq: Every day | SUBCUTANEOUS | 11 refills | Status: DC

## 2016-02-20 NOTE — Progress Notes (Signed)
Internal Medicine Clinic Attending  Case discussed with Dr. Strelow at the time of the visit.  We reviewed the resident's history and exam and pertinent patient test results.  I agree with the assessment, diagnosis, and plan of care documented in the resident's note.  

## 2016-02-20 NOTE — Progress Notes (Signed)
   CC: DM f/u HPI: [name] is a 50 y.o. male with a h/o of t2DM who presents for BG f/u and titration of insulins.  Please see Problem-based charting for HPI and the status of patient's chronic medical conditions.  Past Medical History:  Diagnosis Date  . Arthritis    left knee  . Diabetes mellitus   . Hyperlipidemia    Social History  Substance Use Topics  . Smoking status: Never Smoker  . Smokeless tobacco: Not on file  . Alcohol use No   No family history on file. Review of Systems: ROS in HPI. Otherwise: Review of Systems  Constitutional: Negative for chills, fever and weight loss.  Respiratory: Negative for cough and shortness of breath.   Cardiovascular: Negative for chest pain and leg swelling.  Gastrointestinal: Negative for abdominal pain, constipation, diarrhea, nausea and vomiting.  Genitourinary: Negative for dysuria, frequency and urgency.   Physical Exam: Vitals:   02/20/16 0859  BP: (!) 141/76  Pulse: 65  Temp: 98.4 F (36.9 C)  TempSrc: Oral  SpO2: 100%  Weight: 200 lb 9.6 oz (91 kg)  Height: 6' (1.829 m)   Physical Exam  Constitutional: He is cooperative. No distress.  Cardiovascular: Normal rate, regular rhythm, normal heart sounds and normal pulses.  Exam reveals no gallop.   No murmur heard. Pulmonary/Chest: Effort normal and breath sounds normal. No respiratory distress. He has no wheezes. He has no rhonchi. He has no rales.  Abdominal: Soft. Bowel sounds are normal. There is no tenderness.  Musculoskeletal: He exhibits no edema.  Skin: Skin is intact.    Assessment & Plan:  See encounters tab for problem based medical decision making. Patient discussed with Dr. Dareen Hernandez  Uncontrolled type II diabetes mellitus [facility]) Patient presents 1 week after reinitiation of long-acting insulin. At his previous visit his glargine insulin was refilled and he has been taking 65 units daily since. He has also been taking his metformin. He presents  with his blood glucose meter readings over the last week. Average blood glucose 267. His range has been in the high 200s to low 300s. He did have one episode of low blood glucose at 90 on January 20. On further investigation patient denies any changes to his diet or other activities during that day. He also denies any symptoms of hypoglycemia.  Patient was counseled about concern of hypoglycemia and was given precautions to take something sugary if his blood sugar were to get below 70 or he were to feel poorly. He was also notified that he should call our clinic with any large changes to his blood glucose readings.  Plan: Increase Toujeo insulin from 65 units daily to 70 units daily. Blood glucose 3 times daily. Return in 2-3 weeks with blood glucose meter for continued titration of insulin.   Signed: Holley Raring, MD 02/20/2016, 10:08 AM  Pager: [phone]

## 2016-02-20 NOTE — Assessment & Plan Note (Signed)
Patient presents 1 week after reinitiation of long-acting insulin. At his previous visit his glargine insulin was refilled and he has been taking 65 units daily since. He has also been taking his metformin. He presents with his blood glucose meter readings over the last week. Average blood glucose 267. His range has been in the high 200s to low 300s. He did have one episode of low blood glucose at 90 on January 20. On further investigation patient denies any changes to his diet or other activities during that day. He also denies any symptoms of hypoglycemia.  Patient was counseled about concern of hypoglycemia and was given precautions to take something sugary if his blood sugar were to get below 70 or he were to feel poorly. He was also notified that he should call our clinic with any large changes to his blood glucose readings.  Plan: Increase Toujeo insulin from 65 units daily to 70 units daily. Blood glucose 3 times daily. Return in 2-3 weeks with blood glucose meter for continued titration of insulin.

## 2016-02-20 NOTE — Patient Instructions (Signed)
Thanks for bringing your blood sugar meter. You blood sugars are still high and we will increase your insulin to 70 units every day.  Please bring your meter back for follow up in 2-3 weeks.

## 2016-02-24 ENCOUNTER — Encounter: Payer: Self-pay | Admitting: Internal Medicine

## 2016-03-12 ENCOUNTER — Other Ambulatory Visit: Payer: Self-pay | Admitting: Pharmacist

## 2016-03-12 ENCOUNTER — Ambulatory Visit (INDEPENDENT_AMBULATORY_CARE_PROVIDER_SITE_OTHER): Payer: BLUE CROSS/BLUE SHIELD | Admitting: Pulmonary Disease

## 2016-03-12 ENCOUNTER — Telehealth: Payer: Self-pay | Admitting: Pharmacist

## 2016-03-12 VITALS — BP 123/82 | HR 64 | Temp 98.4°F | Wt 208.8 lb

## 2016-03-12 DIAGNOSIS — E1165 Type 2 diabetes mellitus with hyperglycemia: Principal | ICD-10-CM

## 2016-03-12 DIAGNOSIS — IMO0001 Reserved for inherently not codable concepts without codable children: Secondary | ICD-10-CM

## 2016-03-12 DIAGNOSIS — Z23 Encounter for immunization: Secondary | ICD-10-CM | POA: Diagnosis not present

## 2016-03-12 DIAGNOSIS — Z794 Long term (current) use of insulin: Secondary | ICD-10-CM

## 2016-03-12 DIAGNOSIS — Z0000 Encounter for general adult medical examination without abnormal findings: Secondary | ICD-10-CM

## 2016-03-12 MED ORDER — LIRAGLUTIDE 18 MG/3ML ~~LOC~~ SOPN: 1.2000 mg | PEN_INJECTOR | Freq: Every day | SUBCUTANEOUS | 3 refills | Status: DC

## 2016-03-12 MED ORDER — EMPAGLIFLOZIN 10 MG PO TABS: 10.0000 mg | ORAL_TABLET | Freq: Every day | ORAL | 3 refills | Status: DC

## 2016-03-12 MED ORDER — METFORMIN HCL ER 500 MG PO TB24: 1500.0000 mg | ORAL_TABLET | Freq: Every day | ORAL | 11 refills | Status: DC

## 2016-03-12 NOTE — Progress Notes (Signed)
   CC: diabetes  HPI:  [name] is a 51 y.o. man with history as noted below here for follow up of diabetes  He is taking Toujeo 70 units daily and metformin 1000mg  daily. Average CBG is 215 on his meter. Fasting blood glucoses have been between 72 and 284. A couple of readings of 72-75 which he states he feels symptomatic. He eats two meals a day at noon and 8:30pm. He sometimes has diarrhea with metformin but none recently.    Past Medical History:  Diagnosis Date  . Arthritis    left knee  . Diabetes mellitus   . Hyperlipidemia     Review of Systems:   No fevers or chills No chest pain  Physical Exam:  Vitals:   03/12/16 0824 03/12/16 0852  BP: (!) 143/80 123/82  Pulse: 70 64  Temp: 98.4 F (36.9 C)   TempSrc: Oral   SpO2: 100%   Weight: 208 lb 12.8 oz (94.7 kg)    General Apperance: NAD HEENT: Normocephalic, atraumatic, anicteric sclera Neck: Supple, trachea midline Lungs: Clear to auscultation bilaterally. No wheezes, rhonchi or rales. Breathing comfortably Heart: Regular rate and rhythm Abdomen: Soft, nontender, nondistended, no rebound/guarding Extremities: Warm and well perfused, no edema Skin: No rashes or lesions Neurologic: Alert and interactive. No gross deficits.  Assessment & Plan:   See Encounters Tab for problem based charting.  Patient discussed with Dr. Angelia Mould

## 2016-03-12 NOTE — Assessment & Plan Note (Signed)
Assessment: Improved CBG on Toujeo 70 units daily and metformin 1000mg  daily. Average CBG is 215 on his meter. Fasting blood glucoses have been between 72 and 284. A couple of readings of 72-75 which he states he feels symptomatic.  Plan: Continue Toujeo 70u daily Increase metformin to 1500mg  daily and after one week try to increase to 2000mg  daily Urine microalbumin Follow up in 4-6 weeks

## 2016-03-12 NOTE — Patient Instructions (Signed)
Keep taking your Toujeo. Increase your METFORMIN to 3 tablets (1,500 mg total) by mouth daily with breakfast. Increase to 4 tablets (2000mg ) daily after 1 week if no diarrhea. Follow up in 4-6 weeks with your meter

## 2016-03-12 NOTE — Assessment & Plan Note (Signed)
Flu shot administered today. 

## 2016-03-13 ENCOUNTER — Telehealth: Payer: Self-pay | Admitting: *Deleted

## 2016-03-13 LAB — MICROALBUMIN / CREATININE URINE RATIO
Creatinine, Urine: 95.8 mg/dL
MICROALBUM., U, RANDOM: 24.3 ug/mL
Microalb/Creat Ratio: 25.4 mg/g creat (ref 0.0–30.0)

## 2016-03-13 MED ORDER — METFORMIN HCL ER 500 MG PO TB24: 1500.0000 mg | ORAL_TABLET | Freq: Every day | ORAL | 11 refills | Status: DC

## 2016-03-13 NOTE — Progress Notes (Signed)
Internal Medicine Clinic Attending  Case discussed with Dr. Krall at the time of the visit.  We reviewed the resident's history and exam and pertinent patient test results.  I agree with the assessment, diagnosis, and plan of care documented in the resident's note.  

## 2016-03-13 NOTE — Telephone Encounter (Signed)
Received faxed form from pt's pharmacy requesting clarification on pt's metformin rx.  Rx written for #30, discussed with ordering MD, rx changed to #120.  Message left w/pharmacy (on recorder).[name], Samuel Giraud Cassady2/13/20189:00 AM

## 2016-03-21 MED ORDER — LIRAGLUTIDE 18 MG/3ML ~~LOC~~ SOPN: 1.2000 mg | PEN_INJECTOR | Freq: Every day | SUBCUTANEOUS | 3 refills | Status: DC

## 2016-03-21 MED ORDER — EMPAGLIFLOZIN 10 MG PO TABS: 10.0000 mg | ORAL_TABLET | Freq: Every day | ORAL | 3 refills | Status: DC

## 2016-03-21 NOTE — Progress Notes (Signed)
Working with Dr. Randell Patient and patient to start either empagliflozin or liraglutide. Patient had a lapse in insurance coverage which is now active. Will check with pharmacy to find out which is more affordable. Patient requested a clinic appointment with me to review new therapy. Appointment scheduled.

## 2016-03-26 ENCOUNTER — Ambulatory Visit: Payer: BLUE CROSS/BLUE SHIELD | Admitting: Pharmacist

## 2016-03-26 ENCOUNTER — Encounter: Payer: Self-pay | Admitting: Dietician

## 2016-05-21 ENCOUNTER — Ambulatory Visit (INDEPENDENT_AMBULATORY_CARE_PROVIDER_SITE_OTHER): Payer: BLUE CROSS/BLUE SHIELD | Admitting: Internal Medicine

## 2016-05-21 DIAGNOSIS — E1165 Type 2 diabetes mellitus with hyperglycemia: Principal | ICD-10-CM

## 2016-05-21 DIAGNOSIS — IMO0001 Reserved for inherently not codable concepts without codable children: Secondary | ICD-10-CM

## 2016-05-21 DIAGNOSIS — E11649 Type 2 diabetes mellitus with hypoglycemia without coma: Secondary | ICD-10-CM | POA: Diagnosis not present

## 2016-05-21 DIAGNOSIS — Z794 Long term (current) use of insulin: Secondary | ICD-10-CM | POA: Diagnosis not present

## 2016-05-21 LAB — POCT GLYCOSYLATED HEMOGLOBIN (HGB A1C): Hemoglobin A1C: 11.6

## 2016-05-21 LAB — GLUCOSE, CAPILLARY: Glucose-Capillary: 295 mg/dL — ABNORMAL HIGH (ref 65–99)

## 2016-05-21 MED ORDER — INSULIN GLARGINE 300 UNIT/ML ~~LOC~~ SOPN: 65.0000 [IU] | PEN_INJECTOR | Freq: Every day | SUBCUTANEOUS | 11 refills | Status: DC

## 2016-05-21 MED ORDER — EMPAGLIFLOZIN 10 MG PO TABS: 10.0000 mg | ORAL_TABLET | Freq: Every day | ORAL | 3 refills | Status: DC

## 2016-05-21 NOTE — Patient Instructions (Addendum)
[name],  I am going to reduce the dose of the Toujeo to 65 units in the morning when you get up for the day. I am going to send in a prescription for a new medication called Jardiance today.  Please follow up on Wednesday to get the monitor placed and schedule a follow up appointment to have it read a week later.

## 2016-05-21 NOTE — Progress Notes (Signed)
   CC: DM follow up  HPI:  [name] is a 51 y.o. male with a past medical history listed below here today for follow up of his DM.  For details of today's visit and the status of his chronic medical issues please refer to the assessment and plan.   Past Medical History:  Diagnosis Date  . Arthritis    left knee  . Diabetes mellitus 2003  . Hyperlipidemia     Review of Systems:   See HPI  Physical Exam:  Vitals:   05/21/16 1027  BP: (!) 147/81  Pulse: 81  Temp: 98.5 F (36.9 C)  TempSrc: Oral  SpO2: 100%  Weight: 210 lb 8 oz (95.5 kg)   Physical Exam  Constitutional: He is well-developed, well-nourished, and in no distress.  Cardiovascular: Normal rate and regular rhythm.   Pulmonary/Chest: Effort normal and breath sounds normal.  Vitals reviewed.    Assessment & Plan:   See Encounters Tab for problem based charting.  Patient discussed with Dr. Lynnae January

## 2016-05-21 NOTE — Progress Notes (Signed)
Internal Medicine Clinic Attending  Case discussed with Dr. Boswell at the time of the visit.  We reviewed the resident's history and exam and pertinent patient test results.  I agree with the assessment, diagnosis, and plan of care documented in the resident's note.  

## 2016-05-21 NOTE — Assessment & Plan Note (Addendum)
Lab Results  Component Value Date   HGBA1C 11.6 05/21/2016   HGBA1C >14.0 02/14/2016   HGBA1C >14.0 07/18/2015     Brings his medications with him today. Reports taking metformin 500 mg bid and toujeo 70 units qam. Reports having diarrhea with higher dose of the metformin. Reports several episodes of hypoglycemia. Only in the mornings. Says it wakes him up in the morning. Reports taking something sugary and feels better afterwards. Works second shift and takes the [employer] before going to sleep.   Brings his meter with him today. Only 12 readings over the past month. Readings are variable with several readings in the 60s and highs unreadable.   Wife reports discussion over starting liraglutide or empagliflozin had with Dr. Randell Patient. Telephone notes indicate that they were looking into affordability. Do not see any indication on an answer to that question. Patient reports he would rather take an oral pill than have another injection.   Assessment: uncontrolled DM type II  Plan: Agreeable to CGM. Will return on Wednesday for monitor placement (no one available today). F/U in clinic in 1 week after that for monitor download and interpretation.   Given his hypoglycemia, will decrease Toujeo to 65 units and instruct him to take it in the mornings.  Start Empagliflozin 10 mg daily today.

## 2016-05-22 ENCOUNTER — Telehealth: Payer: Self-pay | Admitting: Internal Medicine

## 2016-05-22 NOTE — Telephone Encounter (Signed)
APT. REMINDER CALL, LMTCB °

## 2016-05-23 ENCOUNTER — Ambulatory Visit: Payer: BLUE CROSS/BLUE SHIELD

## 2016-05-23 ENCOUNTER — Ambulatory Visit (INDEPENDENT_AMBULATORY_CARE_PROVIDER_SITE_OTHER): Payer: BLUE CROSS/BLUE SHIELD | Admitting: Dietician

## 2016-05-23 DIAGNOSIS — IMO0001 Reserved for inherently not codable concepts without codable children: Secondary | ICD-10-CM

## 2016-05-23 DIAGNOSIS — E1165 Type 2 diabetes mellitus with hyperglycemia: Secondary | ICD-10-CM

## 2016-05-23 DIAGNOSIS — Z794 Long term (current) use of insulin: Secondary | ICD-10-CM | POA: Diagnosis not present

## 2016-05-23 NOTE — Progress Notes (Signed)
Patient is here for a Freestyle Libre Pro CGM sensor to be placed and started. Patient was educated using translator [phone] about wearing sensor, keeping food, activity and medication log and when to call office. Follow up was arranged with the patient. His wife was present during the entire visit. They said that their daughter can help them complete the food/activity/ medication/glucose records that are written in [place].  The patient verbalized understanding of instruction using teach back. He hopes this device will help to reduce his low blood sugar events that he reports are happening upon waking ~ 1-3 PM.  Note, he works night shift. Eats at 3 AM, 3PM and 8 PM, he checks his blood sugar at 3 AM and 3 PM. He sleeps from 4 am  Until 12-2 PM [place], RD 05/23/2016 1:15 PM.

## 2016-05-30 ENCOUNTER — Ambulatory Visit (INDEPENDENT_AMBULATORY_CARE_PROVIDER_SITE_OTHER): Payer: BLUE CROSS/BLUE SHIELD | Admitting: Internal Medicine

## 2016-05-30 ENCOUNTER — Ambulatory Visit (INDEPENDENT_AMBULATORY_CARE_PROVIDER_SITE_OTHER): Payer: BLUE CROSS/BLUE SHIELD | Admitting: Dietician

## 2016-05-30 ENCOUNTER — Encounter: Payer: Self-pay | Admitting: Internal Medicine

## 2016-05-30 VITALS — BP 137/79 | HR 74 | Temp 98.2°F | Ht 72.0 in | Wt 211.9 lb

## 2016-05-30 DIAGNOSIS — E1165 Type 2 diabetes mellitus with hyperglycemia: Secondary | ICD-10-CM

## 2016-05-30 DIAGNOSIS — Z794 Long term (current) use of insulin: Secondary | ICD-10-CM

## 2016-05-30 DIAGNOSIS — E118 Type 2 diabetes mellitus with unspecified complications: Secondary | ICD-10-CM | POA: Diagnosis not present

## 2016-05-30 DIAGNOSIS — Z713 Dietary counseling and surveillance: Secondary | ICD-10-CM

## 2016-05-30 DIAGNOSIS — IMO0001 Reserved for inherently not codable concepts without codable children: Secondary | ICD-10-CM

## 2016-05-30 MED ORDER — INSULIN ASPART PROT & ASPART (70-30 MIX) 100 UNIT/ML PEN
PEN_INJECTOR | SUBCUTANEOUS | 3 refills | Status: DC
Start: 2016-05-30 — End: 2016-10-23

## 2016-05-30 NOTE — Progress Notes (Signed)
  Medical Nutrition Therapy:  Appt start time: 0930 end time:  1015. Visit # 1  Assessment:  Primary concerns today: glycemic control.  Patient is here for a Freestyle Libre Pro CGM sensor download after wearing it for 7 days. His wife is with him( translated for him) , their food and activity records are sparse. He ran out of insulin the past week with his last dose having been on Sunday.  He is used to his blood sugars being above 200 mg/dl and feels best when they are in the 200s. Higher than that his wife says he falls asleep often. They verbalize knowledge that this is a symptom of high blood sugar. He checks his blood sugar at 3 AM and 3 PM. 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 Preferred Learning Style: No preference indicated  Learning Readiness: contemplating/Ready  ANTHROPOMETRICS: weight-211.9#, height-72" BMI-28.7, IBW-180-196 #  SLEEP:5-7AM until 12-2PM  MEDICATIONS: glipizide, toujeo 65 units a day, metformin BLOOD SUGAR: average 274, highest risk of low blood sugar is between 8 AM and noon when he is sleeping, variability is high at CV of 31.4%  DIETARY INTAKE: Usual eating pattern includes 3 meals and 0 snacks per day. Dining Out (times/week): seldom Everyday foods include chicken, pita bread, regular soda, tea with whole milk.   24-hr recall: Eats at 3 AM, 3PM and 8 PM,.  Meals are tea with 8 oz whole milk & ~1/2 teaspoon sugar & pita bread, chicken leg, banana & pits bread, ox tail stew with pita bread, drinks regular soda at times  Usual physical activity: work is physical  Progress Towards Goal(s):  In progress.   Nutritional Diagnosis:  [place]-2.3 Food-medication interaction As related to diabetes medicine, activity and food not coordinated to control blood sugars.  As evidenced by his CGM download and average glucose of 274 with high variabililty.    Intervention:  Nutrition education about need to  gradually lower blood sugars and reduce highs and lows. Encouraged low sugar, lower fat meals/foods.  Coordination of care: discussed CGM download with Dr. Genene Churn  Teaching Method Utilized: Visual,Auditory Handouts given during visit include:CGM and meter download and AVS Barriers to learning/adherence to lifestyle change: competing values Demonstrated degree of understanding via:  Teach Back   Monitoring/Evaluation:  Dietary intake, exercise, meter, and body weight 1-4 weeks. Korin Setzler, [name], [place] 05/30/2016 11:21 AM.

## 2016-05-30 NOTE — Patient Instructions (Signed)
Please consider low fat milk and drinking diet soda instead of regular soda and whole milk.  Be sure to take your new insulin before you eat a meal.   It is also important to continue your meals at 3 PM, 8 PM, 3AM and 7 AM.   [name] [phone]

## 2016-05-30 NOTE — Assessment & Plan Note (Signed)
Seen on 4/23 by Dr. Charlynn Grimes, on metformin 500mg  bid and toujeo 70units qAM. Had several episodes of hypoglycemia in the mornings and highs which are higher than readable range. Toujeo decreased to 65 units, started on Jardiance 10mg  daily. CGM was placed.   Never started jardiace due to cost. Doing metformin and glipizide, ran out of toujeo on April 29th. CGM monitored, has spikes after meals, and then lows between meals.   [name] wore the CGM for 7 days. The average reading was 274, % time in target was 15, % time below target was 0, and 85 % time above target.   Intervention will be to stop glipizide, stop toujeo. Continue metformin 500mg  bid. Start 70/30 insulin 40 units before 3 pm meal and 20 units before 3 am meal.  The patient will be scheduled to see Barry Brunner and Dr. Genene Churn for a final appointment.

## 2016-05-30 NOTE — Progress Notes (Signed)
   CC: DM II follow up   HPI:  [name] is a 51 y.o. with pmh as listed below is here for DM II follow up   Past Medical History:  Diagnosis Date  . Arthritis    left knee  . Diabetes mellitus 2003  . Hyperlipidemia    Seen on 4/23 by Dr. Charlynn Grimes, on metformin 500mg  bid and toujeo 70units qAM. Had several episodes of hypoglycemia in the mornings and highs which are higher than readable range. Toujeo decreased to 65 units, started on Jardiance 10mg  daily. CGM was placed.   Never started jardiace due to cost. Doing metformin and glipizide, ran out of toujeo on April 29th. CGM monitored, has spikes after meals, and then lows between meals.   [name] wore the CGM for 7 days. The average reading was 274, % time in target was 15, % time below target was 0, and 85 % time above target.   Intervention will be to stop glipizide, stop toujeo. Continue metformin 500mg  bid. Start 70/30 insulin 40 units before 3 pm meal and 20 units before 3 am meal.  The patient will be scheduled to see Barry Brunner and Dr. Genene Churn for a final appointment.    Review of Systems:   Review of Systems  Constitutional: Negative for chills and fever.  Cardiovascular: Negative for chest pain.  Neurological: Negative for dizziness.     Physical Exam:  Vitals:   05/30/16 1015  BP: 137/79  Pulse: 74  Temp: 98.2 F (36.8 C)  TempSrc: Oral  SpO2: 100%  Weight: 211 lb 14.4 oz (96.1 kg)  Height: 6' (1.829 m)   Physical Exam  Constitutional: He is oriented to person, place, and time. He appears well-developed and well-nourished.  Cardiovascular: Normal rate and regular rhythm.  Exam reveals no friction rub.   No murmur heard. Respiratory: Effort normal and breath sounds normal.  GI: Soft.  Musculoskeletal: Normal range of motion.  Neurological: He is alert and oriented to person, place, and time.    Assessment & Plan:   See Encounters Tab for problem based charting.  Patient discussed with Dr.  Dareen Piano

## 2016-05-30 NOTE — Patient Instructions (Signed)
Stop glipizide and toujeo  Start taking 70/30 Insulin 40 units before 3 PM meal and 20 units before 3 AM meal.  Follow up in 1 week with Barry Brunner and Dr. Genene Churn

## 2016-05-30 NOTE — Progress Notes (Signed)
Internal Medicine Clinic Attending  Case discussed with Dr. Genene Churn at the time of the visit.  We reviewed the resident's history and exam and pertinent patient test results.  I agree with the assessment, diagnosis, and plan of care documented in the resident's note.  I have personally reviewed the CBG monitor report and agree with the findings and plan of Dr. Genene Churn

## 2016-06-06 ENCOUNTER — Encounter: Payer: Self-pay | Admitting: Internal Medicine

## 2016-06-06 ENCOUNTER — Ambulatory Visit (INDEPENDENT_AMBULATORY_CARE_PROVIDER_SITE_OTHER): Payer: BLUE CROSS/BLUE SHIELD | Admitting: Dietician

## 2016-06-06 ENCOUNTER — Ambulatory Visit (INDEPENDENT_AMBULATORY_CARE_PROVIDER_SITE_OTHER): Payer: BLUE CROSS/BLUE SHIELD | Admitting: Internal Medicine

## 2016-06-06 DIAGNOSIS — Z794 Long term (current) use of insulin: Secondary | ICD-10-CM

## 2016-06-06 DIAGNOSIS — E118 Type 2 diabetes mellitus with unspecified complications: Secondary | ICD-10-CM

## 2016-06-06 DIAGNOSIS — Z6828 Body mass index (BMI) 28.0-28.9, adult: Secondary | ICD-10-CM | POA: Diagnosis not present

## 2016-06-06 DIAGNOSIS — IMO0001 Reserved for inherently not codable concepts without codable children: Secondary | ICD-10-CM

## 2016-06-06 DIAGNOSIS — Z713 Dietary counseling and surveillance: Secondary | ICD-10-CM

## 2016-06-06 DIAGNOSIS — E119 Type 2 diabetes mellitus without complications: Secondary | ICD-10-CM

## 2016-06-06 DIAGNOSIS — E1165 Type 2 diabetes mellitus with hyperglycemia: Principal | ICD-10-CM

## 2016-06-06 NOTE — Progress Notes (Signed)
   CC: DM II f/up  HPI:  [name] is a 51 y.o. with pmh as listed below is here for DM II f/up  Past Medical History:  Diagnosis Date  . Arthritis    left knee  . Diabetes mellitus 2003  . Hyperlipidemia    He was here on 5/2 for CGM reading, we reviewed it, made adjustment to his insulin regimen including d/cing glipizide and toujeo (due to cost of toujeo), continuing metformin 500mg  bid, and starting him on Novolog 70/30 40 units before 3 pm meal, 20 units before 3 am meal. He has been doing 40 units before 3 am and 30 units before 3 pm. However, he is not eating at those times. He sometimes does not eat before going to bed around 5 am, but still takes insulin, then wakes up and eats around 3-5 pm before going to work, and then again around 8:30 pm at work.  [name] wore the CGM for 15 days. The average reading was 336, % time in target was 8% % time below target was 0%, and % time above target was 92%. Intervention will be to to change insulin timing and dose.     Review of Systems:    Review of Systems  Constitutional: Negative for chills and fever.  Cardiovascular: Negative for chest pain.  Gastrointestinal: Negative for heartburn.  Genitourinary: Negative for dysuria.  Musculoskeletal: Negative for myalgias.  Neurological: Negative for dizziness.     Physical Exam:  Vitals:   06/06/16 0951  BP: 129/83  Pulse: 81  Temp: 98.2 F (36.8 C)  TempSrc: Oral  SpO2: 100%  Weight: 209 lb 1.6 oz (94.8 kg)  Height: 6\' 1"  (1.854 m)   Physical Exam  Constitutional: He is oriented to person, place, and time. He appears well-developed and well-nourished.  Respiratory: Effort normal and breath sounds normal.  Musculoskeletal: Normal range of motion. He exhibits no edema.  Neurological: He is alert and oriented to person, place, and time.    Assessment & Plan:   See Encounters Tab for problem based charting.  Patient seen with Dr. Angelia Mould

## 2016-06-06 NOTE — Assessment & Plan Note (Addendum)
  He was here on 5/2 for CGM reading, we reviewed it, made adjustment to his insulin regimen including d/cing glipizide and toujeo (due to cost of toujeo), continuing metformin 500mg  bid, and starting him on Novolog 70/30 40 units before 3 pm meal, 20 units before 3 am meal. He has been doing 40 units before 3 am and 30 units before 3 pm. However, he is not eating at those times. He sometimes does not eat before going to bed around 5 am, but still takes insulin, then wakes up and eats around 3-5 pm before going to work, and then again around 8:30 pm at work.  [name] wore the CGM for 15 days. The average reading was 336, % time in target was 8% % time below target was 0%, and % time above target was 92%. Intervention will be to to change insulin timing and dose.   Do novolog 70/30 30 units before eating 1st meal before going to bed around 5 am.   Do novolog 70/30 40 units before eating 2nd meal after waking up around 3-5 pm.  Cont metformin 500mg  bid.  f/up 2 weeks.

## 2016-06-06 NOTE — Patient Instructions (Addendum)
Drink 2% milk for now.  Try to be sure to eat something every day or drink large glass of milk before going to sleep at 8 AM after you take your insulin.  You should take your larger dose of insulin at 3 Pm when you awake and before eating.  Take your smaller dose of insulin in the morning before eating/drinking and going to sleep.    Inject your PM larger dose in the abdomen  Inject your am smaller dose in the legs.   Move your injection 1-2" from the previous injection .  Follow up with me in 3-4 months please.

## 2016-06-06 NOTE — Progress Notes (Signed)
  Medical Nutrition Therapy:  Appt start time: 0915 end time:  0950. Visit # 2  Assessment:  Primary concerns today: glycemic control.  Patient is here for a Freestyle Libre Pro CGM sensor final download after wearing it for 14 days. His wife is with him (translated for him) , their food and activity records are sparse. He ran out of insulin the past week with his last dose having been on Sunday. He injects his insulin at various places on his body with no consistency.  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 Preferred Learning Style: No preference indicated  Learning Readiness: contemplating/Ready  ANTHROPOMETRICS: weight-211.9#, height-72" BMI-28.7, IBW-180-196 #  SLEEP:5-7AM until 12-2PM  MEDICATIONS:40 units qam of Novolog Mix 70/30 and 30 units PM of same, metformin BLOOD SUGAR: very high with extreme dips twice since starting Novolog Mix 70/30; His blood sugars were in the 400s unit Monday when he began 40 units of 70/30 Novovlog Mix in the am, 30 units in the PM. He felt bad CBG 69, so Tuesday did not take his PM dose of insulin.   DIETARY INTAKE: Usual eating pattern includes 3 meals and 0 snacks per day. Dining Out (times/week): seldom Everyday foods include chicken, pita bread, regular soda, tea with whole milk.   24-hr recall: Eats at 3 AM, 3PM and 8 PM and some times 8 AM.  Meals are tea with 8 oz whole milk & ~1/2 teaspoon sugar & pita bread, chicken leg, banana & pits bread, ox tail stew with pita bread, drinks regular soda at times  Usual physical activity: work is physical  Progress Towards Goal(s):  In progress.   Nutritional Diagnosis:  [name]-2.3 Food-medication interaction As related to diabetes medicine, activity and food not coordinated to control blood sugars.  As evidenced by his CGM download and average glucose of 274 with high variabililty.    Intervention:  Nutrition education about need to gradually lower blood sugars and reduce  highs and lows. Encouraged low sugar, lower fat meals/foods. He may do better to move his insulin dose to 3 am at work if consistently eating at 3 AM.  Coordination of care: discussed CGM download with Dr. Genene Churn, timing of the insulin will be paramount. CGM shows he needs both basal and bolus insulin  Teaching Method Utilized: Visual,Auditory Handouts given during visit include:CGM and meter download and AVS Barriers to learning/adherence to lifestyle change: competing values Demonstrated degree of understanding via:  Teach Back   Monitoring/Evaluation:  Dietary intake, exercise, meter, and body weight in 4 week(s). [name], [name] 06/06/2016 3:03 PM.

## 2016-06-06 NOTE — Patient Instructions (Signed)
Take 40 units of insulin when you eat your meal after you wake up from sleep.  Take 30 units of insulin when you eat before going to sleep in the morning.  Try to eat regularly.   Come back in 2 weeks for follow up  Check sugar 4 times a day.

## 2016-06-12 NOTE — Progress Notes (Signed)
Internal Medicine Clinic Attending  I saw and evaluated the patient.  I personally confirmed the key portions of the history and exam documented by Dr. Genene Hernandez and I reviewed pertinent patient test results.  The assessment, diagnosis, and plan were formulated together and I agree with the documentation in the resident's note. Overall the CGM data is of limited value as he is consistently high.  I suspect a large degree of medication (insulin non compliance) as the cause of his uncontrolled DM.  We have encouraged him to eat more consistent meals and to take his 70/30 insulin as directed by Dr [name].  If he is not headed in the right direction at his follow up I would consider a trial of the V-Go insulin pump as this may allow for more on demand dosing of his mealtime insulin.

## 2016-06-18 ENCOUNTER — Encounter: Payer: Self-pay | Admitting: Internal Medicine

## 2016-06-18 ENCOUNTER — Ambulatory Visit: Payer: BLUE CROSS/BLUE SHIELD | Admitting: Dietician

## 2016-06-18 ENCOUNTER — Ambulatory Visit (INDEPENDENT_AMBULATORY_CARE_PROVIDER_SITE_OTHER): Payer: BLUE CROSS/BLUE SHIELD | Admitting: Internal Medicine

## 2016-06-18 VITALS — BP 146/93 | HR 81 | Temp 99.2°F | Ht 73.0 in | Wt 208.5 lb

## 2016-06-18 DIAGNOSIS — E1165 Type 2 diabetes mellitus with hyperglycemia: Principal | ICD-10-CM

## 2016-06-18 DIAGNOSIS — Z794 Long term (current) use of insulin: Secondary | ICD-10-CM | POA: Diagnosis not present

## 2016-06-18 DIAGNOSIS — E118 Type 2 diabetes mellitus with unspecified complications: Secondary | ICD-10-CM

## 2016-06-18 DIAGNOSIS — IMO0001 Reserved for inherently not codable concepts without codable children: Secondary | ICD-10-CM

## 2016-06-18 LAB — GLUCOSE, CAPILLARY: Glucose-Capillary: 357 mg/dL — ABNORMAL HIGH (ref 65–99)

## 2016-06-18 NOTE — Progress Notes (Signed)
Diabetes Self-Management Education  Visit Type:  Follow-up  Appt. Start Time: 930 Appt. End Time: 945  06/18/2016  [name], identified by name and date of birth, is a 51 y.o. male with a diagnosis of Diabetes:  2 ASSESSMENT  Patient is practicing Ramadan right now for 28 days. He eats at 8 PM and 3 AM and more than his usual because he is not eating his 230 PM or 8 AM meals.  Weight- 208.5#- improving       Diabetes Self-Management Education - 06/18/16 1000      Health Coping   How would you rate your overall health? Good     Psychosocial Assessment   Patient Belief/Attitude about Diabetes Motivated to manage diabetes   Self-care barriers English as a second language   Self-management support Doctor's office;Family;CDE visits   Patient Concerns Medication;Monitoring   Preferred Learning Style Auditory;Visual;Hands on   Learning Readiness Contemplating     Patient Self-Evaluation of Goals - Patient rates self as meeting previously set goals (% of time)   Medications 25 - 50%   Monitoring 25 - 50%     Outcomes   Program Status Not Completed     Subsequent Visit   Since your last visit have you continued or begun to take your medications as prescribed? No   Since your last visit have you had your blood pressure checked? No   Since your last visit have you experienced any weight changes? Loss   Weight Loss (lbs) 2   Since your last visit, are you checking your blood glucose at least once a day? No    Learning Objective:  Patient will have a greater understanding of diabetes self-management. Patient education plan is to attend individual and/or group sessions per assessed needs and concerns.   Plan:   Patient Instructions  Take insulin about 15 minutes before eating.   Eat a protein, starch and lot's of vegetables at meals  Snacks should be a food high in protein and a starch. Carbohydrate like fruit or bread.   [name] [phone]    Expected Outcomes:   Demonstrated interest in learning. 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

## 2016-06-18 NOTE — Assessment & Plan Note (Addendum)
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   Review of glucometer shows most readings trending 200s-300s though he started fasting for Ramadan just one day after the last reading, 06/03/16. We verified his meter matched today's date though was 1 hour ahead. He acknowledges taking 30 units at 8 pm but no insulin at 3 am but no metformin. He did have three readings <70 for which he recalls feeling shakiness but no nausea, vomiting, sweatiness. His CBG in the office today is 357 suggesting we have room for improvement.   Plan -Calibrated his meter -Advise him to check his sugars twice daily before when he eats -Resume metformin 500 mg twice daily with plan to uptitrate as he can tolerate -Follow-up in 1 week

## 2016-06-18 NOTE — Patient Instructions (Signed)
Take insulin about 15 minutes before eating.   Eat a protein, starch and lot's of vegetables at meals  Snacks should be a food high in protein and a starch. Carbohydrate like fruit or bread.   [name] [phone]

## 2016-06-18 NOTE — Progress Notes (Signed)
   CC: diabetes  HPI:  [name] is a 51 y.o. who presents today with his wife for diabetes. Please see assessment & plan for status of chronic medical problems.   Past Medical History:  Diagnosis Date  . Arthritis    left knee  . Diabetes mellitus 2003  . Hyperlipidemia     Review of Systems:  Please see each problem below for a pertinent review of systems.  Physical Exam:  Vitals:   06/18/16 0920  BP: (!) 146/93  Pulse: 81  Temp: 99.2 F (37.3 C)  TempSrc: Oral  SpO2: 100%  Weight: 208 lb 8 oz (94.6 kg)  Height: 6\' 1"  (1.854 m)   Physical Exam  Constitutional: He is oriented to person, place, and time. No distress.  HENT:  Head: Normocephalic and atraumatic.  Eyes: Conjunctivae are normal. No scleral icterus.  Cardiovascular: Normal rate and regular rhythm.   Pulmonary/Chest: Effort normal. No respiratory distress.  Neurological: He is alert and oriented to person, place, and time.  Skin: Skin is warm and dry. He is not diaphoretic.     Assessment & Plan:   See Encounters Tab for problem based charting.  Patient discussed with Dr. Daryll Drown

## 2016-06-18 NOTE — Patient Instructions (Signed)
TAKE metformin 2 tablets.   CHECK sugar before eating at 8pm and 3am.   Let's see each other back in 1 week.

## 2016-06-19 NOTE — Progress Notes (Signed)
Internal Medicine Clinic Attending  Case discussed with Dr. Patel,Rushil at the time of the visit.  We reviewed the resident's history and exam and pertinent patient test results.  I agree with the assessment, diagnosis, and plan of care documented in the resident's note.  

## 2016-06-26 ENCOUNTER — Ambulatory Visit: Payer: BLUE CROSS/BLUE SHIELD

## 2016-07-10 ENCOUNTER — Encounter: Payer: Self-pay | Admitting: *Deleted

## 2016-07-31 ENCOUNTER — Encounter: Payer: Self-pay | Admitting: Internal Medicine

## 2016-07-31 ENCOUNTER — Ambulatory Visit (INDEPENDENT_AMBULATORY_CARE_PROVIDER_SITE_OTHER): Payer: BLUE CROSS/BLUE SHIELD | Admitting: Internal Medicine

## 2016-07-31 VITALS — BP 137/77 | HR 80 | Temp 98.2°F | Wt 206.6 lb

## 2016-07-31 DIAGNOSIS — R195 Other fecal abnormalities: Secondary | ICD-10-CM

## 2016-07-31 DIAGNOSIS — E1140 Type 2 diabetes mellitus with diabetic neuropathy, unspecified: Secondary | ICD-10-CM

## 2016-07-31 DIAGNOSIS — B351 Tinea unguium: Secondary | ICD-10-CM | POA: Insufficient documentation

## 2016-07-31 DIAGNOSIS — Z13220 Encounter for screening for lipoid disorders: Secondary | ICD-10-CM | POA: Diagnosis not present

## 2016-07-31 DIAGNOSIS — Z794 Long term (current) use of insulin: Secondary | ICD-10-CM

## 2016-07-31 DIAGNOSIS — Z0000 Encounter for general adult medical examination without abnormal findings: Secondary | ICD-10-CM

## 2016-07-31 MED ORDER — TERBINAFINE HCL 250 MG PO TABS: 250.0000 mg | ORAL_TABLET | Freq: Every day | ORAL | 0 refills | Status: AC

## 2016-07-31 NOTE — Assessment & Plan Note (Signed)
Assessment: Patient due for annual lipid profile. Is also over 51 years old without history of prior colon cancer screening  Plan: -Lipid profile -FOBT

## 2016-07-31 NOTE — Assessment & Plan Note (Addendum)
Diabetic Neuropathy Assessment: Diabetic foot exam performed today with decreased sensation bilaterally, small blister present as described above. No other skin breaks or concerning signs of ulcers or infection. Would likely benefit from custom or diabetic shoes and dry work environment to avoid complications. Treatment of concurrent onychomycosis will also reduce risk of further diabetic foot complications   ** Plan: -Encourage checking feet daily, comfortable shoes -Podiatry referral for further eval, custom shoes -Avoid working with wet shoes, feet. Letter to employer sent with pt ** 

## 2016-07-31 NOTE — Patient Instructions (Addendum)
Refill your Metformin and resume taking two pills per day now. Also continue using your insulin as prescribed about 15 min before each meal.   We are getting lab work today and starting a medication for your toe nails. Take Terbinafine 250 mg each day for 6 weeks. When you come back in 6 weeks we will recheck the labs and give you the medicine for 6 more weeks. If anything is not normal on the labs we will call you.   We will send you to the foot doctor to evaluate your feet and see about getting you special shoes that may help keep your feet healthy.

## 2016-07-31 NOTE — Assessment & Plan Note (Signed)
Onychomycosis Assessment: Recent change with nails to dystrophic, thickened, and discolored appearance consistent with onychomycosis. Given location at toenail, will require prolonged anti-fungal course. To better monitor pt, will give 6 week course of terbinafine and will give additional 6 week supply and check LFTs at f/u appt. Total 12 week tx course  Plan: -Terbinafine 250 mg daily x 6 weeks -CMP today for baseline LFTs -F/u 6 weeks for repeat labs and rx refill

## 2016-07-31 NOTE — Progress Notes (Signed)
   CC: Foot problem  HPI:  SamuelSamuel Hernandez is a 51 y.o. M with past medical history as detailed below who presents today with concern of a blister to L foot and nail appearance.   [name] notes a blister to his L great toe, present for 2-3 weeks. There is no associated pain, redness, or swelling and he denies any associated drainage from the area. He notes smaller blisters have occurred on his R foot in the past that have resolved. He states he tries to check his feet regularly. He and his wife also report the majority of his toe nails have become thickened and discolored over the last three weeks. He and his wife are also concerned that his work environment causes his feet to be wet regularly while at work.     Past Medical History:  Diagnosis Date  . Arthritis    left knee  . Diabetes mellitus 2003  . Hyperlipidemia    Review of Systems:  Review of Systems  Constitutional: Negative for chills and fever.  Cardiovascular: Negative for claudication.  Musculoskeletal: Negative for joint pain and myalgias.    Physical Exam:  Vitals:   07/31/16 0910  BP: 137/77  Pulse: 80  Temp: 98.2 F (36.8 C)  TempSrc: Oral  SpO2: 100%  Weight: 206 lb 9.6 oz (93.7 kg)   Physical Exam  Constitutional: He is oriented to person, place, and time. He appears well-developed and well-nourished.  Cardiovascular: Normal rate, regular rhythm and intact distal pulses.   +2 DP and PT pulses bilaterally    Pulmonary/Chest: Effort normal and breath sounds normal.  Neurological: He is alert and oriented to person, place, and time.  Decreased sensation to bilateral feet   Skin:  Dystrophic, thickened toe nails on bilateral feet, callus formation on several plantar surfaces. Small fluid filled vesicle on L great toe just lateral to nail, no drainage, no erythema or warmth     Assessment & Plan:   See Encounters Tab for problem based charting.  Patient seen with Dr. Angelia Mould

## 2016-08-01 LAB — LIPID PANEL
CHOL/HDL RATIO: 4.8 ratio (ref 0.0–5.0)
Cholesterol, Total: 208 mg/dL — ABNORMAL HIGH (ref 100–199)
HDL: 43 mg/dL (ref >39)
LDL Calculated: 144 mg/dL — ABNORMAL HIGH (ref 0–99)
TRIGLYCERIDES: 106 mg/dL (ref 0–149)
VLDL Cholesterol Cal: 21 mg/dL (ref 5–40)

## 2016-08-01 LAB — CMP14 + ANION GAP
A/G RATIO: 1.3 (ref 1.2–2.2)
ALK PHOS: 94 IU/L (ref 39–117)
ALT: 15 IU/L (ref 0–44)
AST: 13 IU/L (ref 0–40)
Albumin: 4.3 g/dL (ref 3.5–5.5)
Anion Gap: 15 mmol/L (ref 10.0–18.0)
BUN/Creatinine Ratio: 13 (ref 9–20)
BUN: 15 mg/dL (ref 6–24)
Bilirubin Total: <0.2 mg/dL (ref 0.0–1.2)
CO2: 23 mmol/L (ref 20–29)
Calcium: 9.4 mg/dL (ref 8.7–10.2)
Chloride: 100 mmol/L (ref 96–106)
Creatinine, Ser: 1.13 mg/dL (ref 0.76–1.27)
GFR calc Af Amer: 87 mL/min/{1.73_m2} (ref >59)
GFR, EST NON AFRICAN AMERICAN: 75 mL/min/{1.73_m2} (ref >59)
GLOBULIN, TOTAL: 3.2 g/dL (ref 1.5–4.5)
Glucose: 289 mg/dL — ABNORMAL HIGH (ref 65–99)
POTASSIUM: 4.2 mmol/L (ref 3.5–5.2)
SODIUM: 138 mmol/L (ref 134–144)
Total Protein: 7.5 g/dL (ref 6.0–8.5)

## 2016-08-02 ENCOUNTER — Other Ambulatory Visit (INDEPENDENT_AMBULATORY_CARE_PROVIDER_SITE_OTHER): Payer: BLUE CROSS/BLUE SHIELD

## 2016-08-02 ENCOUNTER — Telehealth: Payer: Self-pay | Admitting: *Deleted

## 2016-08-02 DIAGNOSIS — Z1211 Encounter for screening for malignant neoplasm of colon: Secondary | ICD-10-CM

## 2016-08-02 DIAGNOSIS — R195 Other fecal abnormalities: Secondary | ICD-10-CM

## 2016-08-02 DIAGNOSIS — Z0000 Encounter for general adult medical examination without abnormal findings: Secondary | ICD-10-CM

## 2016-08-02 HISTORY — DX: Other fecal abnormalities: R19.5

## 2016-08-02 LAB — IFOBT (OCCULT BLOOD): IMMUNOLOGICAL FECAL OCCULT BLOOD TEST: POSITIVE

## 2016-08-02 NOTE — Addendum Note (Signed)
Addended by: Aldine Contes on: 08/02/2016 10:44 AM   Modules accepted: Orders

## 2016-08-02 NOTE — Telephone Encounter (Signed)
Attempted to contact pt regarding positive IFOBT.  No answer, message left on pt's recorder to call back.  Message left with help of Arabic Interpreter (ID# [phone]) with [employer].Despina Hidden Cassady7/5/20183:28 PM    [employer] [phone]  Option #6

## 2016-08-02 NOTE — Addendum Note (Signed)
Addended by: Tawny Asal A on: 08/02/2016 10:56 AM   Modules accepted: Orders

## 2016-08-02 NOTE — Assessment & Plan Note (Signed)
**  Addendum to visit:  Pt's FIT test resulted positive, the nurse will call the patient with the assistance of interpreter services to inform him of the result and need for colonoscopy. Referral order placed to GI

## 2016-08-03 NOTE — Telephone Encounter (Signed)
Received return call from pt's dtr-which whom pt gave me permission to speak with).she was informed that pt's stool test came back positive for blood and the physician has placed a gastroenterology referral.  Pt's dtr prefers a morning appointment with a physican that is in the [facility] system.  Of note, she also stated that they have moved recently.  New address is [place] [phone]

## 2016-08-05 NOTE — Progress Notes (Signed)
Internal Medicine Clinic Attending  I saw and evaluated the patient.  I personally confirmed the key portions of the history and exam documented by Dr. Harden and I reviewed pertinent patient test results.  The assessment, diagnosis, and plan were formulated together and I agree with the documentation in the resident's note.  

## 2016-08-07 ENCOUNTER — Telehealth: Payer: Self-pay | Admitting: Internal Medicine

## 2016-08-07 NOTE — Telephone Encounter (Signed)
[facility] CALLED SAID THE MEDICATION DOCTOR SENT ON 07/31/16, THEY HAD TO ORDER STILL HAVE NOT RECEIVED WANT TO KNOW IF THEY NEED TO CHANGE TO OTHER MEDICATION.

## 2016-08-09 ENCOUNTER — Encounter: Payer: Self-pay | Admitting: *Deleted

## 2016-08-15 NOTE — Telephone Encounter (Signed)
I called [facility] again today, it still has not come in so I ask that they call the pt and discuss sending it to another pharm and which he would prefer, they will do this and transfer the script.

## 2016-08-31 ENCOUNTER — Ambulatory Visit: Payer: BLUE CROSS/BLUE SHIELD | Admitting: Podiatry

## 2016-09-04 NOTE — Addendum Note (Signed)
Addended by: Hulan Fray on: 09/04/2016 05:29 PM   Modules accepted: Orders

## 2016-09-11 ENCOUNTER — Encounter: Payer: Self-pay | Admitting: Gastroenterology

## 2016-10-23 ENCOUNTER — Ambulatory Visit (AMBULATORY_SURGERY_CENTER): Payer: Self-pay | Admitting: *Deleted

## 2016-10-23 VITALS — Ht 72.0 in | Wt 202.2 lb

## 2016-10-23 DIAGNOSIS — R195 Other fecal abnormalities: Secondary | ICD-10-CM

## 2016-10-23 MED ORDER — NA SULFATE-K SULFATE-MG SULF 17.5-3.13-1.6 GM/177ML PO SOLN: 1.0000 [IU] | Freq: Once | ORAL | 0 refills | Status: AC

## 2016-10-23 NOTE — Progress Notes (Signed)
No egg or soy allergy known to patient  No issues with past sedation with any surgeries  or procedures, no intubation problems  No diet pills per patient No home 02 use per patient  No blood thinners per patient  Pt denies issues with constipation  No A fib or A flutter  EMMI video sent to pt's e mail  

## 2016-11-02 ENCOUNTER — Encounter: Payer: Self-pay | Admitting: Gastroenterology

## 2016-11-09 ENCOUNTER — Encounter: Payer: Self-pay | Admitting: Gastroenterology

## 2016-11-09 ENCOUNTER — Ambulatory Visit (AMBULATORY_SURGERY_CENTER): Payer: BLUE CROSS/BLUE SHIELD | Admitting: Gastroenterology

## 2016-11-09 VITALS — BP 112/75 | HR 64 | Temp 99.1°F | Resp 15 | Ht 72.0 in | Wt 202.0 lb

## 2016-11-09 DIAGNOSIS — R195 Other fecal abnormalities: Secondary | ICD-10-CM

## 2016-11-09 DIAGNOSIS — D123 Benign neoplasm of transverse colon: Secondary | ICD-10-CM

## 2016-11-09 MED ORDER — SODIUM CHLORIDE 0.9 % IV SOLN: 500.0000 mL | INTRAVENOUS | Status: DC

## 2016-11-09 NOTE — Progress Notes (Signed)
Called to room to assist during endoscopic procedure.  Patient ID and intended procedure confirmed with present staff. Received instructions for my participation in the procedure from the performing physician.  

## 2016-11-09 NOTE — Patient Instructions (Signed)
Impression/recommendations:  Polyp (handout given) Hemorrhoids (handout given)  YOU HAD AN ENDOSCOPIC PROCEDURE TODAY AT THE [facility]:   Refer to the procedure report that was given to you for any specific questions about what was found during the examination.  If the procedure report does not answer your questions, please call your gastroenterologist to clarify.  If you requested that your care partner not be given the details of your procedure findings, then the procedure report has been included in a sealed envelope for you to review at your convenience later.  YOU SHOULD EXPECT: Some feelings of bloating in the abdomen. Passage of more gas than usual.  Walking can help get rid of the air that was put into your GI tract during the procedure and reduce the bloating. If you had a lower endoscopy (such as a colonoscopy or flexible sigmoidoscopy) you may notice spotting of blood in your stool or on the toilet paper. If you underwent a bowel prep for your procedure, you may not have a normal bowel movement for a few days.  Please Note:  You might notice some irritation and congestion in your nose or some drainage.  This is from the oxygen used during your procedure.  There is no need for concern and it should clear up in a day or so.  SYMPTOMS TO REPORT IMMEDIATELY:   Following lower endoscopy (colonoscopy or flexible sigmoidoscopy):  Excessive amounts of blood in the stool  Significant tenderness or worsening of abdominal pains  Swelling of the abdomen that is new, acute  Fever of 100F or higher   For urgent or emergent issues, a gastroenterologist can be reached at any hour by calling [phone].   DIET:  We do recommend a small meal at first, but then you may proceed to your regular diet.  Drink plenty of fluids but you should avoid alcoholic beverages for 24 hours.  ACTIVITY:  You should plan to take it easy for the rest of today and you should NOT DRIVE or use heavy  machinery until tomorrow (because of the sedation medicines used during the test).    FOLLOW UP: Our staff will call the number listed on your records the next business day following your procedure to check on you and address any questions or concerns that you may have regarding the information given to you following your procedure. If we do not reach you, we will leave a message.  However, if you are feeling well and you are not experiencing any problems, there is no need to return our call.  We will assume that you have returned to your regular daily activities without incident.  If any biopsies were taken you will be contacted by phone or by letter within the next 1-3 weeks.  Please call us at [phone] if you have not heard about the biopsies in 3 weeks.    SIGNATURES/CONFIDENTIALITY: You and/or your care partner have signed paperwork which will be entered into your electronic medical record.  These signatures attest to the fact that that the information above on your After Visit Summary has been reviewed and is understood.  Full responsibility of the confidentiality of this discharge information lies with you and/or your care-partner. 

## 2016-11-09 NOTE — Progress Notes (Signed)
To PACU, VSS. Report to [email or web address] 

## 2016-11-09 NOTE — Op Note (Signed)
[place] Patient Name: [name] Procedure Date: 11/09/2016 8:57 AM MRN: [id number] Endoscopist: Remo Lipps P. Armbruster MD, MD Age: 51 Referring MD:  Date of Birth: [birth year] Gender: Male Account #: [id number] Procedure:                Colonoscopy Indications:              This is the patient's first colonoscopy, Positive                            fecal immunochemical test Medicines:                Monitored Anesthesia Care Procedure:                Pre-Anesthesia Assessment:                           - Prior to the procedure, a History and Physical                            was performed, and patient medications and                            allergies were reviewed. The patient's tolerance of                            previous anesthesia was also reviewed. The risks                            and benefits of the procedure and the sedation                            options and risks were discussed with the patient.                            All questions were answered, and informed consent                            was obtained. Prior Anticoagulants: The patient has                            taken no previous anticoagulant or antiplatelet                            agents. ASA Grade Assessment: II - A patient with                            mild systemic disease. After reviewing the risks                            and benefits, the patient was deemed in                            satisfactory condition to undergo the procedure.  After obtaining informed consent, the colonoscope                            was passed under direct vision. Throughout the                            procedure, the patient's blood pressure, pulse, and                            oxygen saturations were monitored continuously. The                            Colonoscope was introduced through the anus and                            advanced to the the terminal  ileum, with                            identification of the appendiceal orifice and IC                            valve. The colonoscopy was performed without                            difficulty. The patient tolerated the procedure                            well. The quality of the bowel preparation was                            fair. The ileocecal valve, appendiceal orifice, and                            rectum were photographed. Scope In: 9:07:00 AM Scope Out: 9:25:08 AM Scope Withdrawal Time: 0 hours 11 minutes 40 seconds  Total Procedure Duration: 0 hours 18 minutes 8 seconds  Findings:                 The perianal and digital rectal examinations were                            normal.                           The terminal ileum appeared normal.                           A 4 mm polyp was found in the hepatic flexure. The                            polyp was sessile. The polyp was removed with a                            cold snare. Resection and retrieval were complete.  Internal hemorrhoids were found during                            retroflexion. The hemorrhoids were moderate.                           The prep was fair on insertion. Several minutes                            spent lavaging the colon to achieve adequate views.                            The exam was otherwise without abnormality. Complications:            No immediate complications. Estimated blood loss:                            Minimal. Estimated Blood Loss:     Estimated blood loss was minimal. Impression:               - Preparation of the colon was fair, following                            lavage adequate views obtained for screening                            purposes.                           - The examined portion of the ileum was normal.                           - One 4 mm polyp at the hepatic flexure, removed                            with a cold snare. Resected and  retrieved.                           - Internal hemorrhoids.                           - The examination was otherwise normal.                           Overall, suspect hemorrhoids caused positive stool                            test Recommendation:           - Patient has a contact number available for                            emergencies. The signs and symptoms of potential                            delayed complications were discussed with the  patient. Return to normal activities tomorrow.                            Written discharge instructions were provided to the                            patient.                           - Resume previous diet.                           - Continue present medications.                           - Await pathology results.                           - Repeat colonoscopy is recommended for                            surveillance. The colonoscopy date will be                            determined after pathology results from today's                            exam become available for review. Remo Lipps P. Armbruster MD, MD 11/09/2016 9:29:48 AM This report has been signed electronically.

## 2016-11-12 ENCOUNTER — Telehealth: Payer: Self-pay

## 2016-11-12 NOTE — Telephone Encounter (Signed)
  Follow up Call-  Call back number 11/09/2016  Post procedure Call Back phone  # [phone]  Permission to leave phone message Yes  Some recent data might be hidden     Patient questions:  Do you have a fever, pain , or abdominal swelling? Yes.   Pain Score  0 *  Have you tolerated food without any problems? Yes.    Have you been able to return to your normal activities? Yes.    Do you have any questions about your discharge instructions: Diet   No. Medications  No. Follow up visit  No.  Do you have questions or concerns about your Care? No.  Actions: * If pain score is 4 or above: No action needed, pain <4. Patient did not have fever or pain. Click yes by mistake.

## 2016-11-13 ENCOUNTER — Encounter: Payer: Self-pay | Admitting: Gastroenterology

## 2017-02-27 ENCOUNTER — Ambulatory Visit: Payer: BLUE CROSS/BLUE SHIELD

## 2017-03-18 ENCOUNTER — Ambulatory Visit: Payer: BLUE CROSS/BLUE SHIELD

## 2017-03-18 ENCOUNTER — Telehealth: Payer: Self-pay | Admitting: Internal Medicine

## 2017-03-18 NOTE — Telephone Encounter (Signed)
Patient walked in this morning wanting to be seen had no available openings.  But was able to schedule him for the  afternoon in [facility], but he called back and rescheduled that appointment for next month on 04/01/17 at 8:45 am.

## 2017-04-01 ENCOUNTER — Ambulatory Visit: Payer: BLUE CROSS/BLUE SHIELD

## 2017-05-16 ENCOUNTER — Other Ambulatory Visit: Payer: Self-pay

## 2017-05-16 ENCOUNTER — Telehealth: Payer: Self-pay | Admitting: Internal Medicine

## 2017-05-16 ENCOUNTER — Encounter (INDEPENDENT_AMBULATORY_CARE_PROVIDER_SITE_OTHER): Payer: Self-pay

## 2017-05-16 ENCOUNTER — Ambulatory Visit (INDEPENDENT_AMBULATORY_CARE_PROVIDER_SITE_OTHER): Payer: Self-pay | Admitting: Internal Medicine

## 2017-05-16 ENCOUNTER — Other Ambulatory Visit: Payer: Self-pay | Admitting: *Deleted

## 2017-05-16 VITALS — BP 132/80 | HR 72 | Temp 98.2°F | Ht 73.0 in | Wt 199.1 lb

## 2017-05-16 DIAGNOSIS — IMO0001 Reserved for inherently not codable concepts without codable children: Secondary | ICD-10-CM

## 2017-05-16 DIAGNOSIS — R350 Frequency of micturition: Secondary | ICD-10-CM

## 2017-05-16 DIAGNOSIS — E119 Type 2 diabetes mellitus without complications: Secondary | ICD-10-CM

## 2017-05-16 DIAGNOSIS — E1165 Type 2 diabetes mellitus with hyperglycemia: Secondary | ICD-10-CM

## 2017-05-16 DIAGNOSIS — Z794 Long term (current) use of insulin: Secondary | ICD-10-CM

## 2017-05-16 LAB — BASIC METABOLIC PANEL
ANION GAP: 10 (ref 5–15)
BUN: 18 mg/dL (ref 6–20)
CALCIUM: 9.1 mg/dL (ref 8.9–10.3)
CO2: 21 mmol/L — AB (ref 22–32)
Chloride: 102 mmol/L (ref 101–111)
Creatinine, Ser: 0.97 mg/dL (ref 0.61–1.24)
GFR calc non Af Amer: >60 mL/min (ref >60)
Glucose, Bld: 567 mg/dL (ref 65–99)
Potassium: 4 mmol/L (ref 3.5–5.1)
SODIUM: 133 mmol/L — AB (ref 135–145)

## 2017-05-16 LAB — POCT GLYCOSYLATED HEMOGLOBIN (HGB A1C): Hemoglobin A1C: >14

## 2017-05-16 LAB — GLUCOSE, CAPILLARY: Glucose-Capillary: 584 mg/dL (ref 65–99)

## 2017-05-16 MED ORDER — CONTOUR NEXT MONITOR W/DEVICE KIT: 1.0000 | PACK | Freq: Three times a day (TID) | 0 refills | Status: DC

## 2017-05-16 MED ORDER — GLUCOSE BLOOD VI STRP: ORAL_STRIP | 12 refills | Status: DC

## 2017-05-16 MED ORDER — INSULIN DETEMIR 100 UNIT/ML ~~LOC~~ SOLN: 30.0000 [IU] | Freq: Two times a day (BID) | SUBCUTANEOUS | 11 refills | Status: DC

## 2017-05-16 MED ORDER — MICROLET LANCETS MISC
1 refills | Status: DC
Start: 2017-05-16 — End: 2019-01-06

## 2017-05-16 MED ORDER — METFORMIN HCL 500 MG PO TABS: 500.0000 mg | ORAL_TABLET | Freq: Every day | ORAL | 0 refills | Status: DC

## 2017-05-16 MED ORDER — NEEDLE (DISP) 31G X 5/16" MISC
1.0000 | Freq: Two times a day (BID) | 5 refills | Status: DC
Start: 2017-05-16 — End: 2020-09-19

## 2017-05-16 MED FILL — CONTOUR NEXT METER: W/DEVICE | 30 days supply | Qty: 1 | Fill #0

## 2017-05-16 MED FILL — MICROLET LANCETS: 25 days supply | Qty: 100 | Fill #0

## 2017-05-16 MED FILL — metFORMIN HCL 500 MG TABS: 500 | 30 days supply | Qty: 120 | Fill #0

## 2017-05-16 MED FILL — CONTOUR NEXT STRIPS: 25 days supply | Qty: 100 | Fill #0

## 2017-05-16 MED FILL — LEVEMIR 100 UNITS/ML VIAL: 100 | 17 days supply | Qty: 10 | Fill #0

## 2017-05-16 MED FILL — ULTICARE SYR 0.5 ML 31GX5/1: 31G X 5/16" | 30 days supply | Qty: 60 | Fill #0

## 2017-05-16 NOTE — Progress Notes (Signed)
   CC: Diabetes follow-up  HPI:  [name] is a 52 y.o. male with past medical history outlined below here for diabetes follow-up. For the details of today's visit, please refer to the assessment and plan.  Past Medical History:  Diagnosis Date  . Arthritis    left knee  . Diabetes mellitus 2003  . Hyperlipidemia     Review of Systems  Gastrointestinal: Negative for nausea and vomiting.  Genitourinary: Positive for frequency.  Neurological: Negative for dizziness and weakness.  Endo/Heme/Allergies: Negative for polydipsia.    Physical Exam:  Vitals:   05/16/17 1020  BP: 132/80  Pulse: 72  Temp: 98.2 F (36.8 C)  TempSrc: Oral  SpO2: 100%  Weight: 199 lb 1.6 oz (90.3 kg)  Height: 6\' 1"  (1.854 m)    Constitutional: NAD, appears comfortable Cardiovascular: RRR, no murmurs, rubs, or gallops.  Pulmonary/Chest: CTAB, no wheezes, rales, or rhonchi. Extremities: Warm and well perfused. No edema.  Psychiatric: Normal mood and affect  Assessment & Plan:   See Encounters Tab for problem based charting.  Patient discussed with Dr. Eppie Gibson

## 2017-05-16 NOTE — Patient Instructions (Signed)
FOLLOW-UP INSTRUCTIONS When: 2-4 weeks For: DM follow up What to bring: Glucometer  Mr. Montesinos,  It was a pleasure to meet you. I have restarted you on a different insulin. Please inject 30 units of levemir in the morning and 30 units in the evening. I have also restarted metformin. Please up-titrate this as instructed. Follow up with [place] again in 2-4 weeks with your glucometer. Please check you sugar 3-4 times daily. If you have any questions or concerns, call our clinic at [phone] or after hours call [phone] and ask for the internal medicine resident on call. Thank you!  - Dr. Philipp Ovens

## 2017-05-16 NOTE — Assessment & Plan Note (Addendum)
Patient is here for diabetes follow-up.  He was last seen 9 months ago.  Since that time, patient has lost his job and is no longer insured.  He has been unable to afford his insulin, and has been off all medications for a few months.  CBG today is 584, A1c > 14.  Aside from increased urinary frequency, he is otherwise asymptomatic and feels well.  He reports eating cake for breakfast this morning.  He has not been watching his carbohydrate intake.  He is agreeable to meeting with [name] in our clinic again.  I have changed his 65 units of Toujeo to Levemir 30 units BID.  I also restarted his metformin, given instructions to up titrate from 500 mg daily to 1000 mg twice daily.  Prescriptions were sent to the [facility] outpatient pharmacy on the $4 list.  -- STAT BMP today without anion gap -- Start levemire 30 units BID -- Start metformin 500 mg daily, utitrate to 1,000 mg BID over the next 4 weeks. Instructions provided.  -- MNT referral placed -- Refilled test strips; patient still has his meter -- Instructed to check 3-4 times daily -- Follow up 2-4 weeks with glucometer

## 2017-05-16 NOTE — Telephone Encounter (Signed)
Elmo Putt from the pharmacy questioning the prescription regarding the patient about insulin

## 2017-05-16 NOTE — Telephone Encounter (Signed)
[name] was requesting test strips, lancets and pen needles. This has been addressed in separate encounter from today. Hubbard Hartshorn, RN, BSN

## 2017-05-20 NOTE — Progress Notes (Signed)
Case discussed with Dr. Guilloud at the time of the visit.  We reviewed the resident's history and exam and pertinent patient test results.  I agree with the assessment, diagnosis and plan of care documented in the resident's note. 

## 2017-05-23 ENCOUNTER — Other Ambulatory Visit: Payer: Self-pay | Admitting: *Deleted

## 2017-05-23 DIAGNOSIS — E1165 Type 2 diabetes mellitus with hyperglycemia: Secondary | ICD-10-CM

## 2017-05-23 MED ORDER — INSULIN DETEMIR 100 UNIT/ML ~~LOC~~ SOLN: 30.0000 [IU] | Freq: Two times a day (BID) | SUBCUTANEOUS | 2 refills | Status: DC

## 2017-05-23 MED FILL — LEVEMIR 100 UNITS/ML VIAL: 100 | 30 days supply | Qty: 20 | Fill #0

## 2017-05-23 NOTE — Telephone Encounter (Signed)
Pt's wife was at the clinic - stated Levemir 30 unit twice a day is only lasting 18 days (also I checked with Mannie Stabile) .Please send another rx to [facility] for month supply. Thanks

## 2017-06-04 ENCOUNTER — Encounter: Payer: Self-pay | Admitting: Dietician

## 2017-06-04 ENCOUNTER — Encounter: Payer: Self-pay | Admitting: Internal Medicine

## 2017-07-03 NOTE — Addendum Note (Signed)
Addended by: Hulan Fray on: 07/03/2017 05:49 PM   Modules accepted: Orders

## 2017-08-05 ENCOUNTER — Other Ambulatory Visit: Payer: Self-pay | Admitting: Internal Medicine

## 2017-08-05 DIAGNOSIS — E1165 Type 2 diabetes mellitus with hyperglycemia: Secondary | ICD-10-CM

## 2017-08-05 MED ORDER — METFORMIN HCL 1000 MG PO TABS: 1000.0000 mg | ORAL_TABLET | Freq: Two times a day (BID) | ORAL | 3 refills | Status: DC

## 2017-08-05 MED FILL — LEVEMIR 100 UNITS/ML VIAL: 100 | 30 days supply | Qty: 20 | Fill #1

## 2017-08-05 NOTE — Addendum Note (Signed)
Addended by: Gilles Chiquito B on: 08/05/2017 02:01 PM   Modules accepted: Orders

## 2017-08-19 ENCOUNTER — Encounter: Payer: Self-pay | Admitting: *Deleted

## 2017-09-06 ENCOUNTER — Encounter: Payer: Self-pay | Admitting: Dietician

## 2017-09-06 ENCOUNTER — Telehealth: Payer: Self-pay | Admitting: Dietician

## 2017-09-06 NOTE — Telephone Encounter (Signed)
Using pacific interpreter [phone], called patient to ask him to schedule an appointment. Patient's phone number is not a working number, called the alternative number and  That phone was not accepting calls.

## 2017-10-01 MED FILL — LEVEMIR 100 UNITS/ML VIAL: 100 | 30 days supply | Qty: 20 | Fill #2

## 2017-10-01 MED FILL — metFORMIN HCL 500 MG TABS: 500 | 30 days supply | Qty: 120 | Fill #0

## 2017-11-11 ENCOUNTER — Other Ambulatory Visit: Payer: Self-pay | Admitting: Internal Medicine

## 2017-11-11 DIAGNOSIS — E1165 Type 2 diabetes mellitus with hyperglycemia: Secondary | ICD-10-CM

## 2017-11-11 MED FILL — ULTICARE SYR 0.5 ML 31GX5/1: 31G X 5/16" | 30 days supply | Qty: 60 | Fill #1

## 2017-11-11 MED FILL — LEVEMIR 100 UNITS/ML VIAL: 100 | 30 days supply | Qty: 20 | Fill #3

## 2017-11-12 MED FILL — metFORMIN HCL 500 MG TABS: 500 | 30 days supply | Qty: 120 | Fill #0

## 2018-02-04 ENCOUNTER — Telehealth: Payer: Self-pay | Admitting: Internal Medicine

## 2018-02-04 NOTE — Telephone Encounter (Signed)
Attempted to reach out for return to clinic for diabetes management. Call did not go through. Voice mail not set up.

## 2018-03-10 ENCOUNTER — Other Ambulatory Visit: Payer: Self-pay

## 2018-03-10 ENCOUNTER — Encounter: Payer: Self-pay | Admitting: Internal Medicine

## 2018-03-10 ENCOUNTER — Ambulatory Visit (INDEPENDENT_AMBULATORY_CARE_PROVIDER_SITE_OTHER): Payer: Self-pay | Admitting: Internal Medicine

## 2018-03-10 VITALS — BP 149/96 | HR 99 | Temp 98.6°F | Ht 72.0 in | Wt 207.5 lb

## 2018-03-10 DIAGNOSIS — I10 Essential (primary) hypertension: Secondary | ICD-10-CM | POA: Insufficient documentation

## 2018-03-10 DIAGNOSIS — Z0000 Encounter for general adult medical examination without abnormal findings: Secondary | ICD-10-CM

## 2018-03-10 DIAGNOSIS — E1169 Type 2 diabetes mellitus with other specified complication: Secondary | ICD-10-CM

## 2018-03-10 DIAGNOSIS — E1165 Type 2 diabetes mellitus with hyperglycemia: Secondary | ICD-10-CM

## 2018-03-10 DIAGNOSIS — E785 Hyperlipidemia, unspecified: Secondary | ICD-10-CM

## 2018-03-10 DIAGNOSIS — Z79899 Other long term (current) drug therapy: Secondary | ICD-10-CM

## 2018-03-10 DIAGNOSIS — Z89421 Acquired absence of other right toe(s): Secondary | ICD-10-CM

## 2018-03-10 DIAGNOSIS — N289 Disorder of kidney and ureter, unspecified: Secondary | ICD-10-CM

## 2018-03-10 DIAGNOSIS — Z23 Encounter for immunization: Secondary | ICD-10-CM

## 2018-03-10 LAB — GLUCOSE, CAPILLARY: Glucose-Capillary: 377 mg/dL — ABNORMAL HIGH (ref 70–99)

## 2018-03-10 LAB — POCT GLYCOSYLATED HEMOGLOBIN (HGB A1C): Hemoglobin A1C: 11.1 % — AB (ref 4.0–5.6)

## 2018-03-10 MED ORDER — INSULIN DETEMIR 100 UNIT/ML ~~LOC~~ SOLN: 30.0000 [IU] | Freq: Two times a day (BID) | SUBCUTANEOUS | 7 refills | Status: DC

## 2018-03-10 MED ORDER — PRAVASTATIN SODIUM 40 MG PO TABS: 40.0000 mg | ORAL_TABLET | Freq: Every day | ORAL | 11 refills | Status: DC

## 2018-03-10 MED ORDER — LISINOPRIL 5 MG PO TABS: 5.0000 mg | ORAL_TABLET | Freq: Every day | ORAL | 11 refills | Status: DC

## 2018-03-10 MED ORDER — METFORMIN HCL 500 MG PO TABS: ORAL_TABLET | ORAL | 11 refills | Status: DC

## 2018-03-10 MED ORDER — INSULIN PEN NEEDLE 31G X 5 MM MISC: 1.0000 [IU] | 11 refills | Status: DC

## 2018-03-10 MED ORDER — GLUCOSE BLOOD VI STRP: ORAL_STRIP | 12 refills | Status: DC

## 2018-03-10 MED FILL — PRAVASTATIN NA 40 MG TAB: 40 | 30 days supply | Qty: 30 | Fill #0 | Status: TO

## 2018-03-10 MED FILL — metFORMIN HCL 500 MG TABS: 500 | 30 days supply | Qty: 120 | Fill #0 | Status: TO

## 2018-03-10 MED FILL — LISINOPRIL 5 MG TABLET: 5 | 30 days supply | Qty: 30 | Fill #0 | Status: TO

## 2018-03-10 MED FILL — ULTICARE SYR 0.5 ML 31GX5/1: 31G X 5/16" | 30 days supply | Qty: 60 | Fill #0

## 2018-03-10 MED FILL — LEVEMIR 100 UNITS/ML VIAL: 100 | 16 days supply | Qty: 10 | Fill #0 | Status: TO

## 2018-03-10 MED FILL — CONTOUR NEXT STRIPS: 25 days supply | Qty: 100 | Fill #0 | Status: TO

## 2018-03-10 NOTE — Assessment & Plan Note (Signed)
Received flu shot today. 

## 2018-03-10 NOTE — Progress Notes (Signed)
CC: Follow-up of diabetes  HPI:  [name] is a 53 y.o. male with type 2 diabetes and hyperlipidemia who presents for follow-up of his diabetes.  He speaks Arabic and is accompanied by his wife who serves as his interpreter.  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  His son will return to the clinic with this paperwork.  Type 2 diabetes: We will fasting in clinic on April 2019 (10 months ago) where he was noted to have a CBG of 584 and A1c of  >14.  He was supposed to be taking Levemir 30 units twice daily metformin 500 mg daily (goal was to uptitrate to 1000 mg twice daily over the next 4 weeks).  He was supposed to follow-up in 2 to 4 weeks with glucometer but did not do so.  He states that he has been taking 30 units of Levemir twice daily up until 1 month ago when he began to ran out of his metformin (is only taking at 1000 mg daily prior to running out). He then increased his Levemir to 60 units twice daily and has been taking that every day until 5 days ago when he completely ran out of his insulin.  He is also been out of his metformin for several days.  He denies any episodes of hypoglycemia including dizziness, headaches, palpitations, diaphoresis, and chest pain.  Hypertension: Blood pressure has been elevated over several clinic visits in the past 2 years.  Today is 149/96.  He is not on any blood pressure medications.  Hyperlipidemia:  He is supposed to be taking pravastatin 40 mg daily.  He has not taken this medication several months.  Preventative medicine: Flu shot administered today.  Past Medical History:  Diagnosis Date  . Arthritis    left knee  . Diabetes mellitus 2003  . Hyperlipidemia    Review of Systems:   Review of Systems  Constitutional: Negative for chills and fever.  HENT: Negative for congestion, sinus pain and sore throat.   Cardiovascular: Negative for chest pain,  palpitations and leg swelling.  Gastrointestinal: Negative for abdominal pain, constipation, diarrhea and vomiting.  Genitourinary: Negative for dysuria, frequency, hematuria and urgency.  Neurological: Negative for dizziness and headaches.  All other systems reviewed and are negative.   Physical Exam:  Vitals:   03/10/18 1459  BP: (!) 149/96  Pulse: 99  Temp: 98.6 F (37 C)  TempSrc: Oral  SpO2: 99%  Weight: 207 lb 8 oz (94.1 kg)  Height: 6' (1.829 m)   Physical Exam Constitutional:      Appearance: Normal appearance.  Cardiovascular:     Rate and Rhythm: Normal rate and regular rhythm.     Pulses: Normal pulses.     Heart sounds: Normal heart sounds.  Pulmonary:     Effort: Pulmonary effort is normal.     Breath sounds: Normal breath sounds.  Abdominal:     General: Abdomen is flat.     Palpations: Abdomen is soft.  Musculoskeletal:     Comments: Decreased sensation to the plantar surface of the feet bilaterally.  Right third digit amputation.  No signs of open skin to either foot bilaterally.  Skin:    General: Skin is warm and dry.  Neurological:     Mental Status: He is alert and oriented to person, place, and time. Mental status is at baseline.  Psychiatric:        Mood and  Affect: Mood normal.        Behavior: Behavior normal.     Assessment & Plan:   See Encounters Tab for problem based charting.  Patient discussed with Dr. Angelia Mould

## 2018-03-10 NOTE — Assessment & Plan Note (Addendum)
[name] presents today for follow-up of his diabetes.  He has been taking his Levemir 30 units twice daily very sporadically and recently increased it to 60 units twice daily without guidance from a physician.  He has been out of his medication for 5 days and had an elevated capillary blood sugar of approximately 300 and Hb A1c improved today to 11.1 from >14 10 months ago.  He has also been taking his metformin incorrectly at 1000 mg daily.  I have sent in a prescription of Levemir 30 units twice daily and metformin 1000 mg twice daily (1 year of refills each).  I educated him on the continued risk of further limb amputations, kidney disease, retinopathy.  He expressed understanding and agreement and states that he will take both his insulin and metformin as directed.  Plan: 1.  Levemir 30 units twice daily 2.  Metformin 1000 mg twice daily 3.  Follow-up with PCP Dr. Truman Hayward in early March.  I have instructed him to check his blood sugars 2-3 times daily and bring his glucometer at his next clinic visit.  Addendum:  A1c improved to 11.1.

## 2018-03-10 NOTE — Assessment & Plan Note (Addendum)
His blood pressure has remained elevated over several clinic visits in the past 2 years.  Today is 149/96.  We will start a low-dose lisinopril 5 mg daily and have him follow-up in 1 month with his PCP.  Plan: 1.  Start lisinopril 5 mg daily 2.  Check BMP today  Addendum: Creatinine elevated to 1.49 with normal microalbumin/creatinine ratio. Decreased renal function likely due to uncontrolled HTN and DM. He will need to follow-up with PCP for further evaluation ASAP.

## 2018-03-10 NOTE — Patient Instructions (Signed)
Please take:  Levemir Insulin 30 units twice a day (morning and night) Metformin 1,000 mg (two tablets) twice a day (morning and night) Pravastatin 40 mg once a day Lisinopril 5 mg once a day

## 2018-03-10 NOTE — Assessment & Plan Note (Addendum)
He has not been taking his pravastatin 40 mg daily in over several months.  I have sent in a new prescription of pravastatin with 1 year of refills.  Plan: 1.  Restart pravastatin 40 mg daily

## 2018-03-11 LAB — LIPID PANEL
CHOLESTEROL TOTAL: 210 mg/dL — AB (ref 100–199)
Chol/HDL Ratio: 5.1 ratio — ABNORMAL HIGH (ref 0.0–5.0)
HDL: 41 mg/dL (ref >39)
LDL Calculated: 131 mg/dL — ABNORMAL HIGH (ref 0–99)
Triglycerides: 192 mg/dL — ABNORMAL HIGH (ref 0–149)
VLDL CHOLESTEROL CAL: 38 mg/dL (ref 5–40)

## 2018-03-11 LAB — BMP8+ANION GAP
Anion Gap: 17 mmol/L (ref 10.0–18.0)
BUN/Creatinine Ratio: 17 (ref 9–20)
BUN: 26 mg/dL — ABNORMAL HIGH (ref 6–24)
CO2: 20 mmol/L (ref 20–29)
Calcium: 10.2 mg/dL (ref 8.7–10.2)
Chloride: 101 mmol/L (ref 96–106)
Creatinine, Ser: 1.49 mg/dL — ABNORMAL HIGH (ref 0.76–1.27)
GFR calc Af Amer: 61 mL/min/{1.73_m2} (ref >59)
GFR calc non Af Amer: 53 mL/min/{1.73_m2} — ABNORMAL LOW (ref >59)
Glucose: 382 mg/dL — ABNORMAL HIGH (ref 65–99)
Potassium: 5.1 mmol/L (ref 3.5–5.2)
Sodium: 138 mmol/L (ref 134–144)

## 2018-03-11 LAB — MICROALBUMIN / CREATININE URINE RATIO
Creatinine, Urine: 66.6 mg/dL
MICROALB/CREAT RATIO: 13 mg/g{creat} (ref 0–29)
Microalbumin, Urine: 8.5 ug/mL

## 2018-03-14 NOTE — Progress Notes (Signed)
Internal Medicine Clinic Attending  Case discussed with Dr. Prince at the time of the visit.  We reviewed the resident's history and exam and pertinent patient test results.  I agree with the assessment, diagnosis, and plan of care documented in the resident's note.   

## 2018-03-19 ENCOUNTER — Encounter: Payer: Self-pay | Admitting: Internal Medicine

## 2018-03-19 ENCOUNTER — Ambulatory Visit: Payer: Self-pay

## 2018-04-14 ENCOUNTER — Ambulatory Visit: Payer: Self-pay

## 2018-04-22 ENCOUNTER — Telehealth: Payer: Self-pay | Admitting: Internal Medicine

## 2018-04-22 ENCOUNTER — Other Ambulatory Visit: Payer: Self-pay | Admitting: Internal Medicine

## 2018-04-22 ENCOUNTER — Other Ambulatory Visit: Payer: Self-pay

## 2018-04-22 MED FILL — metFORMIN HCL 500 MG TABS: 500 | 30 days supply | Qty: 120 | Fill #0

## 2018-04-22 MED FILL — LEVEMIR 100 UNITS/ML VIAL: 100 | 16 days supply | Qty: 10 | Fill #0

## 2018-04-22 NOTE — Telephone Encounter (Signed)
Refill Request-patient daughter    insulin detemir (LEVEMIR) 100 UNIT/ML injection  [facility] - [place], [place] - [address].

## 2018-04-22 NOTE — Telephone Encounter (Signed)
Has refills at pharmacy. Called pharm, they will mail his med and notify him

## 2018-04-22 NOTE — Telephone Encounter (Signed)
Called patient's home phone to discuss his diabetes management. Patient did not pick up. Voicemail not set up.

## 2018-04-28 ENCOUNTER — Ambulatory Visit: Payer: Self-pay

## 2018-05-08 ENCOUNTER — Other Ambulatory Visit: Payer: Self-pay

## 2018-05-08 NOTE — Telephone Encounter (Signed)
pravastatin (PRAVACHOL) 40 MG tablet  REFILL REQUEST @ [facility].

## 2018-05-08 NOTE — Telephone Encounter (Signed)
They have script, will fill

## 2018-05-26 MED FILL — PRAVASTATIN NA 40 MG TAB: 40 | 30 days supply | Qty: 30 | Fill #0 | Status: TO

## 2018-05-26 MED FILL — CONTOUR NEXT STRIPS: 25 days supply | Qty: 100 | Fill #0

## 2018-05-26 MED FILL — metFORMIN HCL 500 MG TABS: 500 | 30 days supply | Qty: 120 | Fill #1

## 2018-05-26 MED FILL — LISINOPRIL 5 MG TABLET: 5 | 30 days supply | Qty: 30 | Fill #0 | Status: TO

## 2018-05-26 MED FILL — LEVEMIR 100 UNITS/ML VIAL: 100 | 16 days supply | Qty: 10 | Fill #1 | Status: TO

## 2018-07-07 MED FILL — LISINOPRIL 5 MG TABLET: 5 | 30 days supply | Qty: 30 | Fill #0

## 2018-07-07 MED FILL — PRAVASTATIN NA 40 MG TAB: 40 | 30 days supply | Qty: 30 | Fill #0

## 2018-07-07 MED FILL — LEVEMIR 100 UNITS/ML VIAL: 100 | 30 days supply | Qty: 20 | Fill #0

## 2018-08-26 ENCOUNTER — Other Ambulatory Visit: Payer: Self-pay

## 2018-08-26 ENCOUNTER — Ambulatory Visit (INDEPENDENT_AMBULATORY_CARE_PROVIDER_SITE_OTHER): Payer: Self-pay | Admitting: Internal Medicine

## 2018-08-26 ENCOUNTER — Telehealth: Payer: Self-pay | Admitting: *Deleted

## 2018-08-26 VITALS — BP 122/76 | HR 99 | Temp 98.2°F | Ht 72.0 in | Wt 193.3 lb

## 2018-08-26 DIAGNOSIS — E11621 Type 2 diabetes mellitus with foot ulcer: Secondary | ICD-10-CM

## 2018-08-26 DIAGNOSIS — E785 Hyperlipidemia, unspecified: Secondary | ICD-10-CM

## 2018-08-26 DIAGNOSIS — L97519 Non-pressure chronic ulcer of other part of right foot with unspecified severity: Secondary | ICD-10-CM

## 2018-08-26 DIAGNOSIS — E118 Type 2 diabetes mellitus with unspecified complications: Secondary | ICD-10-CM

## 2018-08-26 DIAGNOSIS — Z89412 Acquired absence of left great toe: Secondary | ICD-10-CM | POA: Insufficient documentation

## 2018-08-26 DIAGNOSIS — Z794 Long term (current) use of insulin: Secondary | ICD-10-CM

## 2018-08-26 DIAGNOSIS — I10 Essential (primary) hypertension: Secondary | ICD-10-CM

## 2018-08-26 DIAGNOSIS — Z79899 Other long term (current) drug therapy: Secondary | ICD-10-CM

## 2018-08-26 DIAGNOSIS — E1165 Type 2 diabetes mellitus with hyperglycemia: Secondary | ICD-10-CM

## 2018-08-26 DIAGNOSIS — Z89429 Acquired absence of other toe(s), unspecified side: Secondary | ICD-10-CM

## 2018-08-26 DIAGNOSIS — E1169 Type 2 diabetes mellitus with other specified complication: Secondary | ICD-10-CM

## 2018-08-26 HISTORY — DX: Acquired absence of left great toe: Z89.412

## 2018-08-26 MED ORDER — METFORMIN HCL 1000 MG PO TABS: 1000.0000 mg | ORAL_TABLET | Freq: Two times a day (BID) | ORAL | 3 refills | Status: DC

## 2018-08-26 MED ORDER — LISINOPRIL 5 MG PO TABS: 5.0000 mg | ORAL_TABLET | Freq: Every day | ORAL | 11 refills | Status: DC

## 2018-08-26 MED ORDER — INSULIN DETEMIR 100 UNIT/ML ~~LOC~~ SOLN: 30.0000 [IU] | Freq: Two times a day (BID) | SUBCUTANEOUS | 7 refills | Status: DC

## 2018-08-26 MED ORDER — PRAVASTATIN SODIUM 40 MG PO TABS: 40.0000 mg | ORAL_TABLET | Freq: Every day | ORAL | 11 refills | Status: DC

## 2018-08-26 MED ORDER — CEPHALEXIN 500 MG PO CAPS: 500.0000 mg | ORAL_CAPSULE | Freq: Four times a day (QID) | ORAL | 0 refills | Status: AC

## 2018-08-26 MED FILL — CEPHALEXIN 500 MG CAPSULE: 500 | 10 days supply | Qty: 40 | Fill #0

## 2018-08-26 MED FILL — PRAVASTATIN NA 40 MG TAB: 40 | 30 days supply | Qty: 30 | Fill #0

## 2018-08-26 MED FILL — LEVEMIR 100 UNITS/ML VIAL: 100 | 17 days supply | Qty: 10 | Fill #0

## 2018-08-26 MED FILL — metFORMIN HCL 1000 MG TABS: 1000 | 30 days supply | Qty: 60 | Fill #0

## 2018-08-26 MED FILL — LISINOPRIL 5 MG TAB: 5 | 30 days supply | Qty: 30 | Fill #0

## 2018-08-26 NOTE — Patient Instructions (Signed)
It was a pleasure meeting you and your wife today. I sent in an antibiotic to the [facility] for your toe. I have also refilled your medications to [facility]. Please follow up in one week in our clinic. You should also follow up with your PCP for diabetes management.

## 2018-08-26 NOTE — Assessment & Plan Note (Signed)
Medications: Lisinopril 5 mg daily Blood pressure looks good in the clinic here today.  Plan: Continue lisinopril 5 mg

## 2018-08-26 NOTE — Progress Notes (Signed)
   CC: foot pain  HPI:  [name] is a 54 y.o. male with a past medical history significant for essential hypertension, uncontrolled type 2 diabetes (A1c in February was 11.1).  Patient presents the clinic today with complaints of a one-week history of a sore on his right second toe.  Patient notes that it has gotten slightly larger.  No inciting event.  Wife is also present at this point and and she expresses that the toe has been draining a green pus.  Patient denies any pain from the toe at this time.  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   Past Medical History:  Diagnosis Date  . Arthritis    left knee  . Diabetes mellitus 2003  . Hyperlipidemia    Review of Systems: Denies any recent fever, chills, nausea, vomiting, diarrhea, constipation or cough, shortness of breath, chest pain  Physical Exam:  Vitals:   08/26/18 1522  BP: 122/76  Pulse: 99  Temp: 98.2 F (36.8 C)  SpO2: 100%  Weight: 193 lb 4.8 oz (87.7 kg)  Height: 6' (1.829 m)    GENERAL: well appearing, in no apparent distress CARDIAC: heart regular rate and rhythm, no peripheral edema appreciated.  Poor hair growth on bilateral lower extremities.  Pedal pulses strong. PULMONARY: lung sounds clear to auscultation SKIN: Wound to the right toe distally.  No drainage from it at this time.  The toe is somewhat tender to palpation.  There is also few other scattered small wounds present. NEURO: Patient reports intact sensation to his bilateral feet.   Assessment & Plan:   See Encounters Tab for problem based charting.  Pertinent labs & imaging results that were available during my care of the patient were reviewed by me and considered in my medical decision making  Patient is in agreement with the plan and endorses no further questions at this time.  Patient seen with Dr. Angelia Mould  Mitzi Hansen, MD Internal Medicine Resident-PGY1 08/26/18

## 2018-08-26 NOTE — Telephone Encounter (Signed)
Pt walks in Will need interpreter- arabic C/o R foot/toes pain, wound, possible fevers appt today 1515 [facility]

## 2018-08-26 NOTE — Assessment & Plan Note (Signed)
Medications: Levemir 30 units twice daily, metformin 1000 mg twice daily, pravastatin 40 mg daily  A1c in February 2020 was 11.  This is actually improved from previous months.  Patient reports today that he has been taking his medications regularly however he does not check his blood sugars at home.  Plan: Medications refilled today as patient reported that he is completely out of them.  We discussed the importance of working hard to control his blood sugars as this is likely a large contributor to his foot ulcer and frequent digital amputations.  I encouraged him to follow-up with his PCP for some blood work regarding his diabetes.

## 2018-08-26 NOTE — Assessment & Plan Note (Signed)
This is a 52 year old male who presents clinic today for evaluation of a right toe ulcer.  Patient presents with his wife who is primary historian.  Wife notes that wound was first noticed on Friday.  Since that time it has began draining green pus and has slightly worsened.  They have been soaking it and salt baths.  Patient denies any fevers or chills during this time.  Patient is a known diabetic with his last A1c exceeding 11.  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  Patient is afebrile in the clinic today ABI were performed in the clinic today.  Left ABI 1.2.  Right ABI 1.16.  These are surprisingly good results and I do not think any follow-up will be needed in regards to his vascularity at this time.  Plan: Keflex 500 mg 4 times daily x10 days.  Encourage patient to follow-up in the clinic in 1 week however he states that he will be in [place] and will be able to do so.  Encouraged him to return to the clinic as soon as he gets back from [facility] for reevaluation of the foot wound.  Also encouraged him that if he develops any fever, chills or malaise, he should seek immediate medical attention as he could develop a blood infection that could be serious.

## 2018-08-27 NOTE — Progress Notes (Signed)
Internal Medicine Clinic Attending  I saw and evaluated the patient.  I personally confirmed the key portions of the history and exam documented by Dr. Christian   and I reviewed pertinent patient test results.  The assessment, diagnosis, and plan were formulated together and I agree with the documentation in the resident's note.  

## 2018-08-28 ENCOUNTER — Ambulatory Visit: Payer: Self-pay

## 2018-09-01 ENCOUNTER — Ambulatory Visit (INDEPENDENT_AMBULATORY_CARE_PROVIDER_SITE_OTHER): Payer: Self-pay | Admitting: Internal Medicine

## 2018-09-01 ENCOUNTER — Other Ambulatory Visit: Payer: Self-pay

## 2018-09-01 DIAGNOSIS — E11621 Type 2 diabetes mellitus with foot ulcer: Secondary | ICD-10-CM

## 2018-09-01 DIAGNOSIS — L97519 Non-pressure chronic ulcer of other part of right foot with unspecified severity: Secondary | ICD-10-CM

## 2018-09-01 DIAGNOSIS — Z89421 Acquired absence of other right toe(s): Secondary | ICD-10-CM

## 2018-09-01 NOTE — Patient Instructions (Signed)
[name],   It was a pleasure taking care of you at the clinic today.  Overall, the infection in your toe seems to have greatly improved with the antibiotics that we started you on.  I would advise that you continue taking care of your foot and avoid any injuries.  Take Care! Dr. Eileen Stanford  Please call the internal medicine center clinic if you have any questions or concerns, we may be able to help and keep you from a long and expensive emergency room wait. Our clinic and after hours phone number is [phone], the best time to call is Monday through Friday 9 am to 4 pm but there is always someone available 24/7 if you have an emergency. If you need medication refills please notify your pharmacy one week in advance and they will send [place] a request.

## 2018-09-01 NOTE — Assessment & Plan Note (Signed)
Right third toe infection: [name] was evaluated by Dr. Darrick Meigs on August 26, 2018 and was found to have an infection of his right third toe at the distal aspect.  He does have uncontrolled diabetes with hemoglobin of 11.1%.  At that visit, an ABI was performed which was unremarkable.  He was subsequently given the diagnosis of diabetic foot infection and was started on Keflex.  Today, he reports significant improvement to the infection and states that he has been healing very well.  He denies fevers, chills or purulent drainage.  Does not appear to have any signs of osteomyelitis however if symptoms or infection worsens, will obtain CRP and imaging to rule out osteomyelitis.  Plan: - Encouraged to finish course of Keflex

## 2018-09-01 NOTE — Progress Notes (Signed)
   CC: Follow-up right third toe infection  HPI:  [name] is a 53 y.o. with medical history listed below presenting to follow-up on infection of his right third toe.  Please see problem based charting for further details  Past Medical History:  Diagnosis Date  . Arthritis    left knee  . Diabetes mellitus 2003  . Hyperlipidemia    Review of Systems: As per HPI  Physical Exam:  Vitals:   09/01/18 1537  BP: 133/78  Pulse: 93  Temp: 98.5 F (36.9 C)  TempSrc: Oral  SpO2: 98%  Weight: 199 lb 3.2 oz (90.4 kg)  Height: 6' (1.829 m)   Physical Exam Vitals signs reviewed.  Constitutional:      General: He is not in acute distress.    Appearance: He is not toxic-appearing.  HENT:     Head: Normocephalic and atraumatic.  Cardiovascular:     Rate and Rhythm: Normal rate.     Heart sounds: Normal heart sounds. No murmur. No gallop.   Pulmonary:     Effort: Pulmonary effort is normal.     Breath sounds: Normal breath sounds. No wheezing.  Musculoskeletal: Normal range of motion.        General: Deformity: right 2nd toe amputation.     Right lower leg: No edema.     Left lower leg: No edema.  Skin:    Findings: No erythema, lesion or rash.  Neurological:     Mental Status: He is alert.       Assessment & Plan:   See Encounters Tab for problem based charting.  Patient discussed with Dr. Rebeca Alert

## 2018-09-02 ENCOUNTER — Encounter: Payer: Self-pay | Admitting: Internal Medicine

## 2018-09-04 NOTE — Progress Notes (Signed)
Internal Medicine Clinic Attending  Case discussed with Dr. Agyei at the time of the visit.  We reviewed the resident's history and exam and pertinent patient test results.  I agree with the assessment, diagnosis, and plan of care documented in the resident's note.  Alexander Raines, M.D., Ph.D.  

## 2018-09-09 MED FILL — LEVEMIR 100 UNITS/ML VIAL: 100 | 17 days supply | Qty: 10 | Fill #1

## 2018-09-18 ENCOUNTER — Encounter: Payer: Self-pay | Admitting: Internal Medicine

## 2018-09-29 MED FILL — LEVEMIR 100 UNITS/ML VIAL: 100 | 17 days supply | Qty: 10 | Fill #2

## 2018-10-01 MED FILL — LISINOPRIL 5 MG TAB: 5 | 30 days supply | Qty: 30 | Fill #1

## 2018-10-01 MED FILL — metFORMIN HCL 1000 MG TABS: 1000 | 30 days supply | Qty: 60 | Fill #1

## 2018-10-01 MED FILL — PRAVASTATIN NA 40 MG TAB: 40 | 30 days supply | Qty: 30 | Fill #1

## 2018-10-13 ENCOUNTER — Ambulatory Visit (INDEPENDENT_AMBULATORY_CARE_PROVIDER_SITE_OTHER): Payer: Self-pay | Admitting: Internal Medicine

## 2018-10-13 DIAGNOSIS — L97519 Non-pressure chronic ulcer of other part of right foot with unspecified severity: Secondary | ICD-10-CM

## 2018-10-13 DIAGNOSIS — M1990 Unspecified osteoarthritis, unspecified site: Secondary | ICD-10-CM

## 2018-10-13 DIAGNOSIS — E11621 Type 2 diabetes mellitus with foot ulcer: Secondary | ICD-10-CM

## 2018-10-13 DIAGNOSIS — E785 Hyperlipidemia, unspecified: Secondary | ICD-10-CM

## 2018-10-13 MED FILL — LEVEMIR 100 UNITS/ML VIAL: 100 | 17 days supply | Qty: 10 | Fill #3

## 2018-10-13 NOTE — Patient Instructions (Addendum)
It was nice seeing you today! Thank you for choosing [facility] for your Primary Care.    Today we talked about:   1. Foot Ulcer: Keep the area clean and dry. Make sure to avoid tight shoes. Always wear socks! Please call the clinic if you develop any pus, fever, increasing pain.

## 2018-10-14 NOTE — Assessment & Plan Note (Signed)
Acute worsening of right great toe ulcer after it resolved approximately 1 month ago due to new shoes patient has been wearing. He demonstrated the fit of the shoes on examination and they are quite tight. Discussed extensively the importance of wearing appropriate shoes and if he continues to wearing current shoes, that he needs to wear protective socks. No signs of infection at this time that would warrant initiating antibiotics. Sharon cleaned and dressed wound. Close follow up to see progression.   If worsening, consider Podiatry referral, in addition to infection rule out with CRP and imaging.   Plan:  - Keep dressed, clean and dry - Follow up in 2 weeks

## 2018-10-14 NOTE — Progress Notes (Signed)
   CC: Toe Ulcer  HPI:  [name] is a 53 y.o. male with a past medical history of type 2 diabetes that is not currently well controlled, hyperlipidemia and arthritis who presents to the clinic for evaluation of a toe ulcer.  Per chart review, [name] appears to have had troubles with this right third toe ulcer in the past.  He notes that recently he has started wearing a new pair shoes and he is noticed that they are rubbing.  Over the past 24 hours he has noticed a new ulcer forming on his toe.  He endorses pain to touch but denies pus, erythema, swelling, fever, chills, or lesions anywhere else. He is concerned that he may need antibiotics since he has had complications with foot ulcers in the past.   Past Medical History:  Diagnosis Date  . Arthritis    left knee  . Diabetes mellitus 2003  . Hyperlipidemia    Review of Systems:   Review of Systems  Constitutional: Negative for chills, fever and malaise/fatigue.  Gastrointestinal: Negative for abdominal pain, nausea and vomiting.  Musculoskeletal: Negative for falls and joint pain.  Skin: Negative for itching and rash.       Right toe ulcer  Neurological: Negative for tingling and focal weakness.   Physical Exam:  Vitals:   10/13/18 1527  BP: (!) 142/91  Pulse: 88  Temp: 98.7 F (37.1 C)  TempSrc: Oral  SpO2: 100%  Weight: 205 lb 8 oz (93.2 kg)  Height: 6' (1.829 m)   Physical Exam Constitutional:      General: He is not in acute distress.    Appearance: He is normal weight. He is not toxic-appearing.  Musculoskeletal:        General: Swelling (right lower extremity edema, non pitting, extending from the foot to the mid calf. Some swelling similar on left side but not as severe) present. No tenderness, deformity or signs of injury.  Skin:    General: Skin is warm and dry.     Findings: Lesion (1 cm circular ulcer located on the top of the right 3rd great toe. No pus. No tracking. Please see below for photo)  present.  Neurological:     Mental Status: He is alert and oriented to person, place, and time. Mental status is at baseline.     Gait: Gait normal.  Psychiatric:        Mood and Affect: Mood normal.        Behavior: Behavior normal.       Assessment & Plan:   See Encounters Tab for problem based charting.  Patient seen with Dr. Dareen Piano

## 2018-10-17 NOTE — Progress Notes (Signed)
Internal Medicine Clinic Attending  I saw and evaluated the patient.  I personally confirmed the key portions of the history and exam documented by Dr. Basaraba and I reviewed pertinent patient test results.  The assessment, diagnosis, and plan were formulated together and I agree with the documentation in the resident's note.    

## 2018-10-27 ENCOUNTER — Ambulatory Visit: Payer: Self-pay

## 2018-10-31 MED FILL — LISINOPRIL 5 MG TABLET: 5 | 30 days supply | Qty: 30 | Fill #2

## 2018-10-31 MED FILL — metFORMIN HCL 1000 MG TABS: 1000 | 30 days supply | Qty: 60 | Fill #2

## 2018-10-31 MED FILL — LEVEMIR 100 UNITS/ML VIAL: 100 | 17 days supply | Qty: 10 | Fill #4

## 2018-10-31 MED FILL — PRAVASTATIN NA 40 MG TAB: 40 | 30 days supply | Qty: 30 | Fill #2

## 2018-11-10 ENCOUNTER — Ambulatory Visit: Payer: Self-pay

## 2018-11-14 MED FILL — LEVEMIR 100 UNITS/ML VIAL: 100 | 17 days supply | Qty: 10 | Fill #5

## 2018-12-02 ENCOUNTER — Encounter: Payer: Self-pay | Admitting: Internal Medicine

## 2018-12-22 ENCOUNTER — Telehealth: Payer: Self-pay | Admitting: Internal Medicine

## 2018-12-22 NOTE — Telephone Encounter (Signed)
Attempted to call home number as he is overdue for follow up for diabetes management. [name] did not pick up. Voicemail not set up.

## 2018-12-23 MED FILL — LEVEMIR 100 UNITS/ML VIAL: 100 | 17 days supply | Qty: 10 | Fill #6

## 2018-12-23 MED FILL — LISINOPRIL 20 MG TABLET: 20 | 30 days supply | Qty: 30 | Fill #0

## 2018-12-23 MED FILL — oxyCODONE HCL 5 MG TABS: 5 | 2 days supply | Qty: 10 | Fill #0

## 2018-12-23 MED FILL — PRAVASTATIN NA 40 MG TAB: 40 | 30 days supply | Qty: 30 | Fill #0

## 2018-12-23 MED FILL — ULTICARE SYR 0.5 ML 31GX5/1: 31G X 5/16" | 30 days supply | Qty: 60 | Fill #1

## 2018-12-23 MED FILL — POLY-IRON 150 MG CAPSULE: 150 | 30 days supply | Qty: 60 | Fill #0

## 2018-12-29 MED FILL — MUPIROCIN 2% OINTMENT: 2 | 7 days supply | Qty: 22 | Fill #0

## 2019-01-06 ENCOUNTER — Encounter: Payer: Self-pay | Admitting: Internal Medicine

## 2019-01-06 ENCOUNTER — Other Ambulatory Visit: Payer: Self-pay

## 2019-01-06 ENCOUNTER — Ambulatory Visit: Payer: Medicaid Other | Admitting: Internal Medicine

## 2019-01-06 VITALS — HR 88 | Temp 97.4°F | Ht 72.0 in | Wt 199.3 lb

## 2019-01-06 DIAGNOSIS — E1169 Type 2 diabetes mellitus with other specified complication: Secondary | ICD-10-CM

## 2019-01-06 DIAGNOSIS — E1165 Type 2 diabetes mellitus with hyperglycemia: Secondary | ICD-10-CM

## 2019-01-06 DIAGNOSIS — E118 Type 2 diabetes mellitus with unspecified complications: Secondary | ICD-10-CM | POA: Diagnosis not present

## 2019-01-06 DIAGNOSIS — I10 Essential (primary) hypertension: Secondary | ICD-10-CM | POA: Diagnosis present

## 2019-01-06 DIAGNOSIS — Z79899 Other long term (current) drug therapy: Secondary | ICD-10-CM

## 2019-01-06 DIAGNOSIS — R42 Dizziness and giddiness: Secondary | ICD-10-CM | POA: Diagnosis not present

## 2019-01-06 DIAGNOSIS — Z794 Long term (current) use of insulin: Secondary | ICD-10-CM | POA: Diagnosis not present

## 2019-01-06 DIAGNOSIS — Z89431 Acquired absence of right foot: Secondary | ICD-10-CM

## 2019-01-06 LAB — POCT GLYCOSYLATED HEMOGLOBIN (HGB A1C): Hemoglobin A1C: 9.1 % — AB (ref 4.0–5.6)

## 2019-01-06 LAB — GLUCOSE, CAPILLARY: Glucose-Capillary: 191 mg/dL — ABNORMAL HIGH (ref 70–99)

## 2019-01-06 MED ORDER — CONTOUR NEXT MONITOR W/DEVICE KIT: 1.0000 | PACK | Freq: Three times a day (TID) | 0 refills | Status: DC

## 2019-01-06 MED ORDER — ACCU-CHEK GUIDE VI STRP: ORAL_STRIP | 3 refills | Status: DC

## 2019-01-06 MED ORDER — ACCU-CHEK FASTCLIX LANCETS MISC: 3 refills | Status: DC

## 2019-01-06 MED ORDER — GLUCOSE BLOOD VI STRP: ORAL_STRIP | 12 refills | Status: DC

## 2019-01-06 MED ORDER — LANCETS ULTRA THIN 30G MISC: 11 refills | Status: DC

## 2019-01-06 NOTE — Patient Instructions (Addendum)
[name],  It was a pleasure to see you today. Thank you for coming in.   Today we discussed your dizziness.  This may be related to your blood pressure medication or your diabetes.  Please hold the lisinopril for now and try to stay well-hydrated with fluid and water intake.  We also discussed your diabetes.  Please continue the metformin and insulin for now.  It is important for you to start checking your blood sugars every day, we need to see if it is getting too high or too low.  Please check it 30 minutes before each meal and before bed, so up to 4 times a day.  Please make sure to bring in your meter on your next visit for [place] to evaluate this.  We are checking some labs and will contact you if there are abnormal.  I have placed a referral for an eye exam.  Please return to clinic in 1 week or sooner if needed.   Thank you again for coming in.   Asencion Noble.D.

## 2019-01-06 NOTE — Assessment & Plan Note (Addendum)
Patient has a history of diabetes and is currently on Metformin 1000 mg twice a day and Levemir 30 units twice a day.  He reports that he has not been checking his blood sugars recently due to not having the meter.  Reports that it had been around 1 40-1 45 and occasionally down to 70-90 but once a week.  He denies any current polyuria, polydipsia, changes in his appetite, nausea, vomiting, or other symptoms.  He does have some mild numbness in his hands and feet.  Also endorsing some episodes of dizziness that started about 1 week ago, worse with standing or getting up too quickly, feels like he may pass out when it occurs.  Since it is unclear what his glucose has been doing recently we will hold off on adjusting any medications at this time.  [name], our diabetes educator, assisted with providing him a meter and gave instructions on how to use this.  Discussed the importance of checking his blood sugars up to 4 times a day and importance of bringing in his meter on his next visit.  -Continue Levemir 30 units twice daily  -Continue Metformin 1000 mg twice daily -Return to clinic in 1 to 2 weeks to reevaluate, if there is difficulty with checking blood sugars, can consider switching to another medication, such as GLP-1, due to the risk of hypoglycemia with insulin -Check A1c and BMP today -Referral for diabetes educator -Eye exam referral

## 2019-01-06 NOTE — Progress Notes (Signed)
CC: Diabetes, hypertension, and dizziness  HPI:  [name] is a 53 y.o.  with a PMH listed below presenting for diabetes, hypertension, and dizziness.    Please see A&P for status of the patient's chronic medical conditions  Past Medical History:  Diagnosis Date  . Arthritis    left knee  . Diabetes mellitus 2003  . Hyperlipidemia    Review of Systems: Refer to history of present illness and assessment and plans for pertinent review of systems, all others reviewed and negative.  Physical Exam:  Vitals:   01/06/19 1049  BP: 132/85  Pulse: 88  Temp: (!) 97.4 F (36.3 C)  TempSrc: Oral  SpO2: 100%  Weight: 199 lb 4.8 oz (90.4 kg)  Height: 6' (1.829 m)   Physical Exam  Constitutional: He is oriented to person, place, and time and well-developed, well-nourished, and in no distress.  HENT:  Head: Normocephalic and atraumatic.  Neck: Normal range of motion. Neck supple.  Cardiovascular: Normal rate, regular rhythm and normal heart sounds.  Pulmonary/Chest: Effort normal and breath sounds normal. No respiratory distress.  Abdominal: Soft. Bowel sounds are normal. He exhibits no distension.  Musculoskeletal: Normal range of motion.        General: No edema.     Comments: Right lower foot amputation, wrapped in bandage  Neurological: He is oriented to person, place, and time.  Skin: Skin is warm and dry.  Psychiatric: Mood and affect normal.    Social History   Socioeconomic History  . Marital status: Married    Spouse name: Not on file  . Number of children: Not on file  . Years of education: 76  . Highest education level: Not on file  Occupational History    Comment: employed at [employer]  . Financial resource strain: Not on file  . Food insecurity    Worry: Not on file    Inability: Not on file  . Transportation needs    Medical: Not on file    Non-medical: Not on file  Tobacco Use  . Smoking status: Never Smoker  . Smokeless tobacco:  Never Used  Substance and Sexual Activity  . Alcohol use: No    Alcohol/week: 0.0 standard drinks  . Drug use: No  . Sexual activity: Not on file    Comment: immigrated from [place], speaks arabic, married  Lifestyle  . Physical activity    Days per week: Not on file    Minutes per session: Not on file  . Stress: Not on file  Relationships  . Social Herbalist on phone: Not on file    Gets together: Not on file    Attends religious service: Not on file    Active member of club or organization: Not on file    Attends meetings of clubs or organizations: Not on file    Relationship status: Not on file  . Intimate partner violence    Fear of current or ex partner: Not on file    Emotionally abused: Not on file    Physically abused: Not on file    Forced sexual activity: Not on file  Other Topics Concern  . Not on file  Social History Narrative  . Not on file   Family History  Problem Relation Age of Onset  . Colon cancer Neg Hx   . Colon polyps Neg Hx   . Esophageal cancer Neg Hx   . Stomach cancer Neg Hx   .  Rectal cancer Neg Hx     Assessment & Plan:   See Encounters Tab for problem based charting.  Patient discussed with Dr. Philipp Ovens

## 2019-01-06 NOTE — Assessment & Plan Note (Signed)
Patient is currently on lisinopril 5 mg daily, he reports that this was just recently started but on chart review it seems like it started >5 months ago.  Blood pressure today was 132/85.  Checked orthostatics, he did have some symptoms of lightheadedness and dizziness with standing, systolic blood pressure went from 128-118.  Denied any difficulty with oral intake, reports staying well-hydrated.  Unclear of cause of his dizziness however lisinopril does have this as a possible side effect.  We will hold lisinopril for now.  Encouraged oral intake. -Hold lisinopril 5 mg daily -Return to clinic in 1 to 2 weeks to reevaluate -Check BMP

## 2019-01-06 NOTE — Progress Notes (Signed)
Internal Medicine Clinic Attending  Case discussed with Dr. Krienke at the time of the visit.  We reviewed the resident's history and exam and pertinent patient test results.  I agree with the assessment, diagnosis, and plan of care documented in the resident's note.    

## 2019-01-07 LAB — BMP8+ANION GAP
Anion Gap: 16 mmol/L (ref 10.0–18.0)
BUN/Creatinine Ratio: 17 (ref 9–20)
BUN: 19 mg/dL (ref 6–24)
CO2: 19 mmol/L — ABNORMAL LOW (ref 20–29)
Calcium: 9.8 mg/dL (ref 8.7–10.2)
Chloride: 105 mmol/L (ref 96–106)
Creatinine, Ser: 1.14 mg/dL (ref 0.76–1.27)
GFR calc Af Amer: 84 mL/min/{1.73_m2} (ref >59)
GFR calc non Af Amer: 73 mL/min/{1.73_m2} (ref >59)
Glucose: 207 mg/dL — ABNORMAL HIGH (ref 65–99)
Potassium: 4.3 mmol/L (ref 3.5–5.2)
Sodium: 140 mmol/L (ref 134–144)

## 2019-01-13 ENCOUNTER — Ambulatory Visit (INDEPENDENT_AMBULATORY_CARE_PROVIDER_SITE_OTHER): Payer: Medicaid Other | Admitting: Internal Medicine

## 2019-01-13 ENCOUNTER — Encounter: Payer: Self-pay | Admitting: Internal Medicine

## 2019-01-13 VITALS — BP 150/84 | HR 92 | Temp 98.0°F | Ht 72.0 in | Wt 199.0 lb

## 2019-01-13 DIAGNOSIS — Z794 Long term (current) use of insulin: Secondary | ICD-10-CM | POA: Diagnosis not present

## 2019-01-13 DIAGNOSIS — E118 Type 2 diabetes mellitus with unspecified complications: Secondary | ICD-10-CM | POA: Diagnosis not present

## 2019-01-13 DIAGNOSIS — Z79899 Other long term (current) drug therapy: Secondary | ICD-10-CM | POA: Diagnosis not present

## 2019-01-13 DIAGNOSIS — I10 Essential (primary) hypertension: Secondary | ICD-10-CM

## 2019-01-13 DIAGNOSIS — E1165 Type 2 diabetes mellitus with hyperglycemia: Secondary | ICD-10-CM

## 2019-01-13 MED ORDER — LOSARTAN POTASSIUM 25 MG PO TABS: 25.0000 mg | ORAL_TABLET | Freq: Every day | ORAL | 3 refills | Status: DC

## 2019-01-13 MED FILL — LOSARTAN POTASSIUM 25 MG TA: 25 | 30 days supply | Qty: 30 | Fill #0

## 2019-01-13 NOTE — Progress Notes (Signed)
Internal Medicine Clinic Attending  Case discussed with Dr. Agyei at the time of the visit.  We reviewed the resident's history and exam and pertinent patient test results.  I agree with the assessment, diagnosis, and plan of care documented in the resident's note.    

## 2019-01-13 NOTE — Assessment & Plan Note (Signed)
Type 2 diabetes mellitus: He tells me today that he regularly checks his CBGs at home and lab it ranges between 90s-170s.  He denies any hypoglycemia.  He also endorses compliance to Levemir 30 units twice a day.  Plan: -Continue Metformin 1000 mg twice daily -Continue Levemir 30 mg twice daily -Repeat A1c in March 2021 -Advised to bring glucometer and next clinic appointment

## 2019-01-13 NOTE — Patient Instructions (Addendum)
[name],   It was a pleasure taking care of you here in the clinic today.  As we discussed, I am switching your blood pressure medicines around to the new medicine called.  You take 1 pill once a day.  Please continue checking your blood sugars and I will have you follow-up with your primary doctor to check your A1c at your next visit.  Take Care! Dr. Eileen Stanford  Please call the internal medicine center clinic if you have any questions or concerns, we may be able to help and keep you from a long and expensive emergency room wait. Our clinic and after hours phone number is [phone], the best time to call is Monday through Friday 9 am to 4 pm but there is always someone available 24/7 if you have an emergency. If you need medication refills please notify your pharmacy one week in advance and they will send [place] a request.

## 2019-01-13 NOTE — Progress Notes (Signed)
   CC: Follow-up hypertension, diabetes mellitus  HPI:  Mr.Emigdio Running is a 53 y.o. community dwelling gentleman with medical history significant hypertension, uncontrolled diabetes mellitus to follow-up chronic medical problems.  Please see problem based charting for further details.  Past Medical History:  Diagnosis Date  . Arthritis    left knee  . Diabetes mellitus 2003  . Hyperlipidemia    Review of Systems:  Review of Systems  Constitutional: Negative for chills and fever.  Eyes: Negative for blurred vision.  Respiratory: Negative for cough.   Neurological: Negative for dizziness and headaches.    Physical Exam:  Vitals:   01/13/19 1023  BP: (!) 150/84  Pulse: 92  Temp: 98 F (36.7 C)  TempSrc: Oral  SpO2: 100%  Weight: 199 lb (90.3 kg)  Height: 6' (1.829 m)   Physical Exam  Constitutional: He is well-developed, well-nourished, and in no distress. No distress.  HENT:  Head: Normocephalic and atraumatic.  Cardiovascular: Normal rate, regular rhythm and normal heart sounds.  Pulmonary/Chest: Effort normal and breath sounds normal. No respiratory distress. He has no wheezes.  Skin: Skin is warm.  Psychiatric: Mood and affect normal.    Assessment & Plan:   See Encounters Tab for problem based charting.  Patient discussed with Dr. Philipp Ovens

## 2019-01-13 NOTE — Assessment & Plan Note (Signed)
Hypertension: During his last visit on 01/06/2019, his lisinopril was held due to symptoms of lightheadedness and dizziness with upstanding.  Orthostatic was unremarkable.  Since his visit, he is not aware of any subsequent dizziness or lightheadedness.  The etiology of his prior symptoms is currently unclear.  BP Readings from Last 3 Encounters:  01/13/19 (!) 150/84  10/13/18 (!) 142/91  09/01/18 133/78    Plan: -Stop lisinopril -Start losartan 25 mg daily with plan to uptitrate

## 2019-01-20 ENCOUNTER — Ambulatory Visit: Payer: Medicaid Other | Admitting: Internal Medicine

## 2019-01-20 ENCOUNTER — Other Ambulatory Visit: Payer: Self-pay

## 2019-01-20 ENCOUNTER — Encounter: Payer: Self-pay | Admitting: Internal Medicine

## 2019-01-20 DIAGNOSIS — Z794 Long term (current) use of insulin: Secondary | ICD-10-CM | POA: Diagnosis not present

## 2019-01-20 DIAGNOSIS — Z79899 Other long term (current) drug therapy: Secondary | ICD-10-CM | POA: Diagnosis not present

## 2019-01-20 DIAGNOSIS — I10 Essential (primary) hypertension: Secondary | ICD-10-CM | POA: Diagnosis not present

## 2019-01-20 DIAGNOSIS — E1165 Type 2 diabetes mellitus with hyperglycemia: Secondary | ICD-10-CM

## 2019-01-20 DIAGNOSIS — E118 Type 2 diabetes mellitus with unspecified complications: Secondary | ICD-10-CM

## 2019-01-20 MED ORDER — INSULIN DETEMIR 100 UNIT/ML ~~LOC~~ SOLN: 30.0000 [IU] | Freq: Two times a day (BID) | SUBCUTANEOUS | 7 refills | Status: DC

## 2019-01-20 MED ORDER — ACCU-CHEK GUIDE VI STRP: ORAL_STRIP | 3 refills | Status: DC

## 2019-01-20 MED FILL — LEVEMIR 100 UNITS/ML VIAL: 100 | 16 days supply | Qty: 10 | Fill #0

## 2019-01-20 NOTE — Progress Notes (Signed)
   CC: HTN and DMII  HPI:[name] is a 53 y.o. male who presents for evaluation of HTN and DMII. Please see individual problem based A/P for details.  Past Medical History:  Diagnosis Date  . Arthritis    left knee  . Diabetes mellitus 2003  . Hyperlipidemia    Review of Systems:  ROS negative except as per HPI.  Physical Exam: Vitals:   01/20/19 1533  BP: 125/71  Pulse: 89  Temp: 98 F (36.7 C)  TempSrc: Oral  SpO2: 100%  Weight: 200 lb 9.6 oz (91 kg)  Height: 6' (1.829 m)   General: A/O x4, in no acute distress, afebrile, nondiaphoretic HEENT: PEERL, EMO intact Cardio: RRR, no mrg's  Pulmonary: CTA bilaterally, no wheezing or crackles  Abdomen: Bowel sounds normal, soft, nontender  MSK: BLE nontender, nonedematous Psych: Appropriate affect, not depressed in appearance, engages well  Assessment & Plan:   See Encounters Tab for problem based charting.  Patient discussed with Dr. Evette Doffing

## 2019-01-20 NOTE — Progress Notes (Signed)
Internal Medicine Clinic Attending  Case discussed with Dr. Harbrecht at the time of the visit.  We reviewed the resident's history and exam and pertinent patient test results.  I agree with the assessment, diagnosis, and plan of care documented in the resident's note.   

## 2019-01-20 NOTE — Assessment & Plan Note (Signed)
DMII: Hgb A1c 9.1 % at the last visit. Repeat A1c not due today. The patient denied polyuria, polydipsia, headache, fatigue, confusion, nausea, vomiting or diaphoresis.  He continues to take his insulin 30 units twice daily and Metformin 1000 units twice daily as prescribed.  He denies polyuria.  He does note improvement in his orthostatics.  He continues to consume large amounts of cake and what appears to be 4-5 servings daily.  He states that his daughter likes to bake cake and he likes to eat it.  Plan: Continue Levemir 30 units twice daily Continue Metformin 1000 units twice daily Discussed dietary modification with decrease in his cake consumption Repeat A1c at next visit Please consider a GLP-1 or SGLT2 reduction in patients insulin requirements at his next visit

## 2019-01-20 NOTE — Patient Instructions (Addendum)
FOLLOW-UP INSTRUCTIONS When: Around March 8th for a routine visit What to bring: All of your medications  For your high blood pressure please continue to hold the medication as you were having symptoms of low blood pressure.   For your diabetes, please decrease the amount of cake you eat daily to one small piece each day and one large piece on the weekend. Please continue to take you insulin and Metformin.  Thank you for your visit to the [facility] today. If you have any questions or concerns please call us at [phone].

## 2019-01-20 NOTE — Assessment & Plan Note (Signed)
Hypertension: Patient's BP today is 125/71 with a goal of <140/80. The patient endorses that he stopped his losartan and that his dizziness resolved. He does not want to resume the medication today. He denied, chest pain, headache, visual changes, lightheadedness, weakness, dizziness on standing, swelling in the feet or ankles. He noted that his dizziness resolved when he stopped taking the losartan five days prior.   Plan: Return on or around March 8th for routine visit with his PCP Hold Losartan 25mg  due to dizziness and resolution with discontinuation.  Would consider further work-up for his dizziness if he is no better after holding the losartan

## 2019-02-06 ENCOUNTER — Ambulatory Visit: Payer: Medicaid Other | Admitting: Dietician

## 2019-02-06 ENCOUNTER — Encounter: Payer: Medicaid Other | Admitting: Internal Medicine

## 2019-02-11 NOTE — Addendum Note (Signed)
Addended by: Orson Gear on: 02/11/2019 10:39 AM   Modules accepted: Orders

## 2019-02-13 ENCOUNTER — Telehealth: Payer: Self-pay | Admitting: Dietician

## 2019-02-13 NOTE — Telephone Encounter (Signed)
Spoke with patient's daughter about [name] rescheduling his appointment to follow up for his diabetes and a retinal exam.His daughter said she would call back next Tuesday with and answer.

## 2019-02-19 NOTE — Telephone Encounter (Signed)
Spoke with the patient and he has sch an appt for 02/23/2019.

## 2019-02-20 NOTE — Telephone Encounter (Signed)
Thank you :)

## 2019-02-23 ENCOUNTER — Encounter: Payer: Medicaid Other | Admitting: Dietician

## 2019-02-24 NOTE — Addendum Note (Signed)
Addended by: Hulan Fray on: 02/24/2019 11:12 AM   Modules accepted: Orders

## 2019-02-27 MED FILL — metFORMIN HCL 1000 MG TABS: 1000 | 30 days supply | Qty: 60 | Fill #3

## 2019-02-27 MED FILL — LEVEMIR 100 UNITS/ML VIAL: 100 | 17 days supply | Qty: 10 | Fill #7

## 2019-02-27 MED FILL — PRAVASTATIN NA 40 MG TAB: 40 | 30 days supply | Qty: 30 | Fill #3

## 2019-03-03 ENCOUNTER — Encounter: Payer: Self-pay | Admitting: Dietician

## 2019-03-03 LAB — HM DIABETES EYE EXAM

## 2019-03-03 NOTE — Progress Notes (Signed)
[facility] Note  03/04/2019     CHIEF COMPLAINT Patient presents for Retina Evaluation and Diabetic Eye Exam   HISTORY OF PRESENT ILLNESS: [name] is a 54 y.o. male who presents to the clinic today for:   HPI    Retina Evaluation    In both eyes.  This started 1 week ago.  Associated Symptoms Negative for Blind Spot, Glare, Shoulder/Hip pain, Fatigue, Jaw Claudication, Photophobia, Distortion, Floaters, Redness, Scalp Tenderness, Weight Loss, Fever, Trauma, Pain and Flashes.  Context:  distance vision, mid-range vision and near vision.  Treatments tried include no treatments.  I, the attending physician,  performed the HPI with the patient and updated documentation appropriately.          Diabetic Eye Exam    Vision fluctuates with blood sugars.  Associated Symptoms Negative for Flashes, Pain, Trauma, Fever, Floaters, Redness, Scalp Tenderness, Weight Loss, Distortion, Photophobia, Jaw Claudication, Fatigue, Blind Spot, Glare and Shoulder/Hip pain.  Diabetes characteristics include Type 2.  This started 20 years ago.  Blood sugar level fluctuates.  Last Blood Glucose 182.  I, the attending physician,  performed the HPI with the patient and updated documentation appropriately.          Comments    Referral of Dr. Shirley Muscat for DME . Patient states he is DM2 x 20 yrs, Bs  Fluctuates, BS this am 182, A1c(unknown), Pt is taking Metformin and is on insulin. Pt denies blurry vision flashes, floaters, and ocular pain.        Last edited by Bernarda Caffey, MD on 03/04/2019  8:55 AM. (History)    pt is here with an interpreter, pt is here on the referral of Dr. Shirley Muscat for diabetic exam, pt states he has cataract sx scheduled for next week with Dr. Katy Fitch -- right eye, pt states he has never seen a retina specialist, he states it had been years since he has seen an eye dr until yesterday when he saw [facility]  Referring physician: Thurston Hole, [place], [place] [address]  HISTORICAL INFORMATION:   Selected notes from the [facility] Patient referred by Dr. Shirley Muscat for diabetic retinal evaluation.   CURRENT MEDICATIONS: No current outpatient medications on file. (Ophthalmic Drugs)   No current facility-administered medications for this visit. (Ophthalmic Drugs)   Current Outpatient Medications (Other)  Medication Sig  . Accu-Chek FastClix Lancets MISC Check blood sugar up to 4 times a day  . Blood Glucose Monitoring Suppl (CONTOUR NEXT MONITOR) w/Device KIT 1 strip by Does not apply route 4 (four) times daily -  before meals and at bedtime. Use to check sugar 3 to 4x daily before meals & at bed  . glucose blood (ACCU-CHEK GUIDE) test strip Check blood sugar 4 times per day  . insulin detemir (LEVEMIR) 100 UNIT/ML injection Inject 0.3 mLs (30 Units total) into the skin 2 (two) times daily.  . Insulin Pen Needle 31G X 5 MM MISC 1 Units by Does not apply route as directed.  . metFORMIN (GLUCOPHAGE) 1000 MG tablet Take 1 tablet (1,000 mg total) by mouth 2 (two) times daily with a meal.  . Needle, Disp, 31G X 5/16" MISC 1 Stick by Does not apply route 2 (two) times daily. Use to inject insulin into the skin 2 times daily. IM PROGRAM  . pravastatin (PRAVACHOL) 40 MG tablet Take 1 tablet (40 mg total) by mouth daily. IM PROGRAM   No current facility-administered medications  for this visit. (Other)      REVIEW OF SYSTEMS: ROS    Positive for: Endocrine, Eyes   Negative for: Constitutional, Gastrointestinal, Neurological, Skin, Genitourinary, Musculoskeletal, HENT, Cardiovascular, Respiratory, Psychiatric, Allergic/Imm, Heme/Lymph   Last edited by Zenovia Jordan, LPN on 4/0/1027  2:53 AM. (History)       ALLERGIES Allergies  Allergen Reactions  . Novolog [Insulin Aspart (Human Analog)] Rash    Spoke to patient and wife, they do not recall a rash with Novolog. They state possibly a minor rash occurred, but  patient was able to tolerate.  . Penicillins     PAST MEDICAL HISTORY Past Medical History:  Diagnosis Date  . Arthritis    left knee  . Diabetes mellitus 2003  . Hyperlipidemia    History reviewed. No pertinent surgical history.  FAMILY HISTORY Family History  Problem Relation Age of Onset  . Colon cancer Neg Hx   . Colon polyps Neg Hx   . Esophageal cancer Neg Hx   . Stomach cancer Neg Hx   . Rectal cancer Neg Hx     SOCIAL HISTORY Social History   Tobacco Use  . Smoking status: Never Smoker  . Smokeless tobacco: Never Used  Substance Use Topics  . Alcohol use: No    Alcohol/week: 0.0 standard drinks  . Drug use: No         OPHTHALMIC EXAM:  Base Eye Exam    Visual Acuity (Snellen - Linear)      Right Left   Dist [place] 20/150 20/25   Dist ph [facility] NI NI       Tonometry (Tonopen, 8:38 AM)      Right Left   Pressure 18 12       Pupils      Dark Light Shape React APD   Right 3 2 Round Brisk None   Left 3 2 Round Brisk None       Visual Fields (Counting fingers)      Left Right    Full Full       Extraocular Movement      Right Left    Full, Ortho Full, Ortho       Neuro/Psych    Oriented x3: Yes       Dilation    Both eyes: 1.0% Mydriacyl, 2.5% Phenylephrine @ 8:37 AM        Slit Lamp and Fundus Exam    Slit Lamp Exam      Right Left   Lids/Lashes Dermatochalasis - upper lid, Meibomian gland dysfunction Dermatochalasis - upper lid   Conjunctiva/Sclera Mild nasal Pinguecula, Melanosis Mild nasal Pinguecula, Melanosis   Cornea Arcus, trace Punctate epithelial erosions Mild Arcus, trace Punctate epithelial erosions   Anterior Chamber Deep and quiet Deep and quiet   Iris Round and dilated, No NVI Round and dilated, No NVI   Lens 2-3+ Nuclear sclerosis, 2-3+ Cortical cataract, 3-4+ central Posterior subcapsular cataract 2+ Nuclear sclerosis, 2-3+ Cortical cataract, 2-3+ central Posterior subcapsular cataract   Vitreous Mild Vitreous syneresis  Mild Vitreous syneresis       Fundus Exam      Right Left   Disc Pink and Sharp Pink and Sharp   C/D Ratio 0.3 0.2   Macula hazy view, grossly flat Flat, Blunted foveal reflex, focal exudates superior to fovea, scattered MA, +edema   Vessels Mild Vascular attenuation, mild Tortuousity Mild Vascular attenuation, mild Tortuousity   Periphery Attached, scattered MA, mild peripheral cystoid degeneration Attached, focal flame  heme at 1130 just off disc, scattered MA, pigmented lattice at 1200, peripheral cystoid degeneration          IMAGING AND PROCEDURES  Imaging and Procedures for _0 @  OCT, Retina - OU - Both Eyes       Right Eye Quality was good. Central Foveal Thickness: 292. Progression has no prior data. Findings include normal foveal contour, no IRF, no SRF (Trace cystic changes; nasal macula obscured by shadowing from dense cataract).   Left Eye Quality was good. Central Foveal Thickness: 237. Progression has no prior data. Findings include normal foveal contour, intraretinal fluid, no SRF, intraretinal hyper-reflective material (Focal DME superior and inferior to fovea).   Notes *Images captured and stored on drive  Diagnosis / Impression:  OD: NFP, no IRF/SRF; Trace cystic changes; nasal macula obscured by shadowing from dense cataract OS: NFP, +IRF, no SRF -- +DME superior and inferior to fovea  Clinical management:  See below  Abbreviations: NFP - Normal foveal profile. CME - cystoid macular edema. PED - pigment epithelial detachment. IRF - intraretinal fluid. SRF - subretinal fluid. EZ - ellipsoid zone. ERM - epiretinal membrane. ORA - outer retinal atrophy. ORT - outer retinal tubulation. SRHM - subretinal hyper-reflective material                 ASSESSMENT/PLAN:    ICD-10-CM   1. Moderate nonproliferative diabetic retinopathy of both eyes with macular edema associated with type 2 diabetes mellitus ([place])  U63.3354   2. Retinal edema  H35.81 OCT,  Retina - OU - Both Eyes  3. Essential hypertension  I10   4. Hypertensive retinopathy of both eyes  H35.033   5. Combined forms of age-related cataract of both eyes  H25.813     1,2. Moderate Non-proliferative diabetic retinopathy, OU  - The incidence, risk factors for progression, natural history and treatment options for diabetic retinopathy  were discussed with patient.    - The need for close monitoring of blood glucose, blood pressure, and serum lipids, avoiding cigarette or any type of tobacco, and the need for long term follow up was also discussed with patient.  - exam OD limited by dense PSC; OS with scattered IRH/MA  - OCT shows diabetic macular edema, OS  - The natural history, pathology, and characteristics of diabetic macular edema discussed with patient.  A generalized discussion of the major clinical trials concerning treatment of diabetic macular edema (ETDRS, DCT, SCORE, RISE / RIDE, and ongoing DRCR net studies) was completed.  This discussion included mention of the various approaches to treating diabetic macular edema (observation, laser photocoagulation, anti-VEGF injections with lucentis / Avastin / Eylea, steroid injections with Kenalog / Ozurdex, and intraocular surgery with vitrectomy).  The goal hemoglobin A1C of 6-7 was discussed, as well as importance of smoking cessation and hypertension control.  Need for ongoing treatment and monitoring were specifically discussed with reference to chronic nature of diabetic macular edema.  - discussed findings  - no retinal intervention recommended at this time, recommend fully assessing diabetic eye disease after cataract surgery -- pt in agreement  - f/u in 4 wks, DFE, OCT, FA (optos, transit OD)  3,4. Hypertensive retinopathy OU  - discussed importance of tight BP control  - monitor  5. Mixed form age related cataracts OU  - The symptoms of cataract, surgical options, and treatments and risks were discussed with patient.  -  visually significant PSC OU (OD > OS)  - clear from a retina standpoint to proceed  with cataract surgery when pt and surgeon are ready  - Cataract surgery OD scheduled for next week (Dr. Katy Fitch)   Ophthalmic Meds Ordered this visit:  No orders of the defined types were placed in this encounter.      Return in about 4 weeks (around 04/01/2019) for f/u NPDR OU, DFE, OCT, FA.  There are no Patient Instructions on file for this visit.   Explained the diagnoses, plan, and follow up with the patient and they expressed understanding.  Patient expressed understanding of the importance of proper follow up care.   This document serves as a record of services personally performed by Gardiner Sleeper, MD, PhD. It was created on their behalf by Roselee Nova, COMT. The creation of this record is the provider's dictation and/or activities during the visit.  Electronically signed by: Roselee Nova, COMT 03/04/19 4:22 PM   Gardiner Sleeper, M.D., Ph.D. Diseases & Surgery of the Retina and Vitreous [facility]  I have reviewed the above documentation for accuracy and completeness, and I agree with the above. Gardiner Sleeper, M.D., Ph.D. 03/04/19 4:22 PM    Abbreviations: M myopia (nearsighted); A astigmatism; H hyperopia (farsighted); P presbyopia; Mrx spectacle prescription;  CTL contact lenses; OD right eye; OS left eye; OU both eyes  XT exotropia; ET esotropia; PEK punctate epithelial keratitis; PEE punctate epithelial erosions; DES dry eye syndrome; MGD meibomian gland dysfunction; ATs artificial tears; PFAT's preservative free artificial tears; [place] nuclear sclerotic cataract; PSC posterior subcapsular cataract; ERM epi-retinal membrane; PVD posterior vitreous detachment; RD retinal detachment; DM diabetes mellitus; DR diabetic retinopathy; NPDR non-proliferative diabetic retinopathy; PDR proliferative diabetic retinopathy; CSME clinically significant macular edema; DME diabetic  macular edema; dbh dot blot hemorrhages; CWS cotton wool spot; POAG primary open angle glaucoma; C/D cup-to-disc ratio; HVF humphrey visual field; GVF goldmann visual field; OCT optical coherence tomography; IOP intraocular pressure; BRVO Branch retinal vein occlusion; CRVO central retinal vein occlusion; CRAO central retinal artery occlusion; BRAO branch retinal artery occlusion; RT retinal tear; SB scleral buckle; PPV pars plana vitrectomy; VH Vitreous hemorrhage; PRP panretinal laser photocoagulation; IVK intravitreal kenalog; VMT vitreomacular traction; MH Macular hole;  NVD neovascularization of the disc; NVE neovascularization elsewhere; AREDS age related eye disease study; ARMD age related macular degeneration; POAG primary open angle glaucoma; EBMD epithelial/anterior basement membrane dystrophy; ACIOL anterior chamber intraocular lens; IOL intraocular lens; PCIOL posterior chamber intraocular lens; Phaco/IOL phacoemulsification with intraocular lens placement; [place] photorefractive keratectomy; LASIK laser assisted in situ keratomileusis; HTN hypertension; DM diabetes mellitus; COPD chronic obstructive pulmonary disease

## 2019-03-04 ENCOUNTER — Other Ambulatory Visit: Payer: Self-pay

## 2019-03-04 ENCOUNTER — Encounter (INDEPENDENT_AMBULATORY_CARE_PROVIDER_SITE_OTHER): Payer: Self-pay | Admitting: Ophthalmology

## 2019-03-04 ENCOUNTER — Ambulatory Visit (INDEPENDENT_AMBULATORY_CARE_PROVIDER_SITE_OTHER): Payer: BLUE CROSS/BLUE SHIELD | Admitting: Ophthalmology

## 2019-03-04 ENCOUNTER — Encounter: Payer: Self-pay | Admitting: *Deleted

## 2019-03-04 DIAGNOSIS — E113313 Type 2 diabetes mellitus with moderate nonproliferative diabetic retinopathy with macular edema, bilateral: Secondary | ICD-10-CM

## 2019-03-04 DIAGNOSIS — I10 Essential (primary) hypertension: Secondary | ICD-10-CM | POA: Diagnosis not present

## 2019-03-04 DIAGNOSIS — H35033 Hypertensive retinopathy, bilateral: Secondary | ICD-10-CM | POA: Diagnosis not present

## 2019-03-04 DIAGNOSIS — H3581 Retinal edema: Secondary | ICD-10-CM

## 2019-03-04 DIAGNOSIS — H25813 Combined forms of age-related cataract, bilateral: Secondary | ICD-10-CM

## 2019-03-04 LAB — HM DIABETES EYE EXAM

## 2019-03-11 MED FILL — PREDNISOLONE AC 1% EYE DROP: 1 | 37 days supply | Qty: 10 | Fill #0

## 2019-03-11 MED FILL — OFLOXACIN 0.3% EYE DROPS: 0.3 | 18 days supply | Qty: 5 | Fill #0

## 2019-03-11 MED FILL — KETOROLAC 0.4% OPHTH SOLN: 0.4 | 18 days supply | Qty: 5 | Fill #0

## 2019-04-01 ENCOUNTER — Encounter (INDEPENDENT_AMBULATORY_CARE_PROVIDER_SITE_OTHER): Payer: Medicaid Other | Admitting: Ophthalmology

## 2019-04-09 ENCOUNTER — Other Ambulatory Visit: Payer: Self-pay | Admitting: Internal Medicine

## 2019-04-09 DIAGNOSIS — E1165 Type 2 diabetes mellitus with hyperglycemia: Secondary | ICD-10-CM

## 2019-04-09 MED FILL — LEVEMIR 100 UNITS/ML VIAL: 100 | 17 days supply | Qty: 10 | Fill #8

## 2019-04-09 MED FILL — LOSARTAN POTASSIUM 25 MG TA: 25 | 30 days supply | Qty: 30 | Fill #1

## 2019-04-13 MED FILL — metFORMIN HCL 1000 MG TABS: 1000 | 30 days supply | Qty: 60 | Fill #0

## 2019-04-17 NOTE — Progress Notes (Signed)
[facility] Note  04/20/2019     CHIEF COMPLAINT Patient presents for Retina Follow Up   HISTORY OF PRESENT ILLNESS: Margaret Staggs is a 54 y.o. male who presents to the clinic today for:   HPI    Retina Follow Up    Patient presents with  Diabetic Retinopathy.  In both eyes.  This started weeks ago.  Severity is moderate.  Duration of weeks.  I, the attending physician,  performed the HPI with the patient and updated documentation appropriately.          Comments    Pt states his vision is "good".  Pt has no complaints.  Pt denies eye pain or discomfort and denies any new or worsening floaters or fol OU.  [facility] interpreter present for todays visit.       Last edited by Bernarda Caffey, MD on 04/20/2019  8:59 AM. (History)    pt is with [facility] interpreter today; pt had cataract sx OD with Dr. Midge Aver, his left eye is not yet scheduled, but he has a follow up appt with Dr. Katy Fitch tomorrow  Referring physician: Thurston Hole, [place], [place] [address]  HISTORICAL INFORMATION:   Selected notes from the [place] Patient referred by Dr. Shirley Muscat for diabetic retinal evaluation.   CURRENT MEDICATIONS: No current outpatient medications on file. (Ophthalmic Drugs)   No current facility-administered medications for this visit. (Ophthalmic Drugs)   Current Outpatient Medications (Other)  Medication Sig  . Accu-Chek FastClix Lancets MISC Check blood sugar up to 4 times a day  . Blood Glucose Monitoring Suppl (CONTOUR NEXT MONITOR) w/Device KIT 1 strip by Does not apply route 4 (four) times daily -  before meals and at bedtime. Use to check sugar 3 to 4x daily before meals & at bed  . glucose blood (ACCU-CHEK GUIDE) test strip Check blood sugar 4 times per day  . insulin detemir (LEVEMIR) 100 UNIT/ML injection Inject 0.3 mLs (30 Units total) into the skin 2 (two) times daily.  . Insulin Pen Needle 31G X 5 MM MISC 1 Units by Does  not apply route as directed.  . metFORMIN (GLUCOPHAGE) 1000 MG tablet TAKE 1 TABLET (1,000 MG TOTAL) BY MOUTH TWICE A DAY WITH A MEAL  . Needle, Disp, 31G X 5/16" MISC 1 Stick by Does not apply route 2 (two) times daily. Use to inject insulin into the skin 2 times daily. IM PROGRAM  . pravastatin (PRAVACHOL) 40 MG tablet Take 1 tablet (40 mg total) by mouth daily. IM PROGRAM   No current facility-administered medications for this visit. (Other)      REVIEW OF SYSTEMS: ROS    Positive for: Endocrine, Eyes   Negative for: Constitutional, Gastrointestinal, Neurological, Skin, Genitourinary, Musculoskeletal, HENT, Cardiovascular, Respiratory, Psychiatric, Allergic/Imm, Heme/Lymph   Last edited by Roselee Nova D, COT on 04/20/2019  8:29 AM. (History)       ALLERGIES Allergies  Allergen Reactions  . Novolog [Insulin Aspart (Human Analog)] Rash    Spoke to patient and wife, they do not recall a rash with Novolog. They state possibly a minor rash occurred, but patient was able to tolerate.  . Penicillins     PAST MEDICAL HISTORY Past Medical History:  Diagnosis Date  . Arthritis    left knee  . Diabetes mellitus 2003  . Hyperlipidemia    History reviewed. No pertinent surgical history.  FAMILY HISTORY Family History  Problem Relation Age of  Onset  . Colon cancer Neg Hx   . Colon polyps Neg Hx   . Esophageal cancer Neg Hx   . Stomach cancer Neg Hx   . Rectal cancer Neg Hx     SOCIAL HISTORY Social History   Tobacco Use  . Smoking status: Never Smoker  . Smokeless tobacco: Never Used  Substance Use Topics  . Alcohol use: No    Alcohol/week: 0.0 standard drinks  . Drug use: No         OPHTHALMIC EXAM:  Base Eye Exam    Visual Acuity (Snellen - Linear)      Right Left   Dist [place] 20/20 -2 20/40 -1   Dist ph   20/30       Tonometry (Tonopen, 8:52 AM)      Right Left   Pressure 21 17       Pupils      Dark Light Shape React APD   Right 3 2 Round Brisk 0    Left 3 2 Round Brisk 0       Visual Fields      Left Right    Full Full       Extraocular Movement      Right Left    Full Full       Neuro/Psych    Oriented x3: Yes   Mood/Affect: Normal       Dilation    Both eyes: 1.0% Mydriacyl, 2.5% Phenylephrine @ 8:52 AM        Slit Lamp and Fundus Exam    Slit Lamp Exam      Right Left   Lids/Lashes Dermatochalasis - upper lid, Meibomian gland dysfunction Dermatochalasis - upper lid   Conjunctiva/Sclera Mild nasal Pinguecula, Melanosis Mild nasal Pinguecula, Melanosis   Cornea Trace Arcus, trace Punctate epithelial erosions, well healed temporal cataract wounds Mild Arcus, trace Punctate epithelial erosions   Anterior Chamber Deep, 0.5+ cell/pigment Deep and quiet   Iris Round and dilated, No NVI Round and dilated, No NVI   Lens PC IOL in good position 2+ Nuclear sclerosis, 2-3+ Cortical cataract, 2-3+ central Posterior subcapsular cataract   Vitreous Mild Vitreous syneresis Mild Vitreous syneresis       Fundus Exam      Right Left   Disc Pink and Sharp Pink and Sharp   C/D Ratio 0.3 0.2   Macula Blunted foveal reflex, mild Retinal pigment epithelial mottling, scattered Microaneurysms, no exudates Flat, Blunted foveal reflex, focal exudates superior to fovea, scattered MA, +edema   Vessels Mild Vascular attenuation Mild Vascular attenuation, mild Tortuousity   Periphery Attached, rare MA, mild peripheral cystoid degeneration Attached, focal flame heme at 1130 just off disc, scattered MA, pigmented lattice at 1200, peripheral cystoid degeneration          IMAGING AND PROCEDURES  Imaging and Procedures for '@TODAY' @  OCT, Retina - OU - Both Eyes       Right Eye Quality was good. Central Foveal Thickness: 218. Progression has been stable. Findings include normal foveal contour, no IRF, no SRF (Trace cystic changes; scattered IRHM).   Left Eye Quality was good. Central Foveal Thickness: 238. Progression has improved.  Findings include normal foveal contour, intraretinal fluid, no SRF, intraretinal hyper-reflective material (Focal Interval improvement in focal IRH/IRHM).   Notes *Images captured and stored on drive  Diagnosis / Impression:  OD: NFP, no IRF/SRF; Trace cystic changes; scattered IRHM OS: NFP, +IRF, no SRF -- Interval improvement in focal IRH/IRHM  Clinical management:  See below  Abbreviations: NFP - Normal foveal profile. CME - cystoid macular edema. PED - pigment epithelial detachment. IRF - intraretinal fluid. SRF - subretinal fluid. EZ - ellipsoid zone. ERM - epiretinal membrane. ORA - outer retinal atrophy. ORT - outer retinal tubulation. SRHM - subretinal hyper-reflective material        Fluorescein Angiography Optos (Transit OD)       Right Eye   Progression has no prior data. Early phase findings include microaneurysm. Mid/Late phase findings include microaneurysm, leakage (No NV).   Left Eye   Progression has no prior data. Early phase findings include microaneurysm. Mid/Late phase findings include microaneurysm, leakage (No NV).   Notes **Images stored on drive**  Impression: Moderate NPDR OU Scattered late leaking MA OU No NV OU                  ASSESSMENT/PLAN:    ICD-10-CM   1. 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 Retinal edema  H35.81 OCT, Retina - OU - Both Eyes  3. Essential hypertension  I10   4. Hypertensive retinopathy of both eyes  H35.033 Fluorescein Angiography Optos (Transit OD)  5. Combined forms of age-related cataract of left eye  H25.812   6. Pseudophakia  Z96.1     1,2. Moderate Non-proliferative diabetic retinopathy, OU  - scattered IRH/MA OU  - OCT shows mild cystic changes/diabetic macular edema OU (OS>OD)  - FA 3.22.21 shows late leaking MA, no NV  - discussed findings, prognosis  - no retinal intervention recommended  at this time  - f/u in 3 months, DFE, OCT  3,4. Hypertensive retinopathy OU  - discussed importance of tight BP control  - monitor  5. Mixed form age related cataracts OS  - The symptoms of cataract, surgical options, and treatments and risks were discussed with patient.  - visually significant PSC  - clear from a retina standpoint to proceed with cataract surgery when pt and surgeon are ready  6. Pseudophakia OD  - s/p CE/IOL OD (Dr. Shirleen Schirmer)  - beautiful surgery, doing well  - monitor    Ophthalmic Meds Ordered this visit:  No orders of the defined types were placed in this encounter.      Return in about 3 months (around 07/21/2019) for f/u NPDR OU, DFE, OCT.  There are no Patient Instructions on file for this visit.   Explained the diagnoses, plan, and follow up with the patient and they expressed understanding.  Patient expressed understanding of the importance of proper follow up care.   This document serves as a record of services personally performed by Gardiner Sleeper, MD, PhD. It was created on their behalf by Leeann Must, [place], a certified ophthalmic assistant. The creation of this record is the provider's dictation and/or activities during the visit.    Electronically signed by: Leeann Must, COA '@TODAY' @ 1:19 PM   This document serves as a record of services personally performed by Gardiner Sleeper, MD, PhD. It was created on their behalf by Ernest Mallick, OA, an ophthalmic assistant. The creation of this record is the provider's dictation and/or activities during the visit.    Electronically signed by: Ernest Mallick, OA 03.22.2021 1:19 PM   Gardiner Sleeper, M.D., Ph.D. Diseases & Surgery of the Retina and [place]  I have reviewed the above documentation for accuracy and  completeness, and I agree with the above. Gardiner Sleeper, M.D., Ph.D. 04/20/19 1:19 PM   Abbreviations: M myopia (nearsighted); A astigmatism; H  hyperopia (farsighted); P presbyopia; Mrx spectacle prescription;  CTL contact lenses; OD right eye; OS left eye; OU both eyes  XT exotropia; ET esotropia; PEK punctate epithelial keratitis; PEE punctate epithelial erosions; DES dry eye syndrome; MGD meibomian gland dysfunction; ATs artificial tears; PFAT's preservative free artificial tears; [place] nuclear sclerotic cataract; PSC posterior subcapsular cataract; ERM epi-retinal membrane; PVD posterior vitreous detachment; RD retinal detachment; DM diabetes mellitus; DR diabetic retinopathy; NPDR non-proliferative diabetic retinopathy; PDR proliferative diabetic retinopathy; CSME clinically significant macular edema; DME diabetic macular edema; dbh dot blot hemorrhages; CWS cotton wool spot; POAG primary open angle glaucoma; C/D cup-to-disc ratio; HVF humphrey visual field; GVF goldmann visual field; OCT optical coherence tomography; IOP intraocular pressure; BRVO Branch retinal vein occlusion; CRVO central retinal vein occlusion; CRAO central retinal artery occlusion; BRAO branch retinal artery occlusion; RT retinal tear; SB scleral buckle; PPV pars plana vitrectomy; VH Vitreous hemorrhage; PRP panretinal laser photocoagulation; IVK intravitreal kenalog; VMT vitreomacular traction; MH Macular hole;  NVD neovascularization of the disc; NVE neovascularization elsewhere; AREDS age related eye disease study; ARMD age related macular degeneration; POAG primary open angle glaucoma; EBMD epithelial/anterior basement membrane dystrophy; ACIOL anterior chamber intraocular lens; IOL intraocular lens; PCIOL posterior chamber intraocular lens; Phaco/IOL phacoemulsification with intraocular lens placement; [place] photorefractive keratectomy; LASIK laser assisted in situ keratomileusis; HTN hypertension; DM diabetes mellitus; COPD chronic obstructive pulmonary disease

## 2019-04-20 ENCOUNTER — Encounter (INDEPENDENT_AMBULATORY_CARE_PROVIDER_SITE_OTHER): Payer: Self-pay | Admitting: Ophthalmology

## 2019-04-20 ENCOUNTER — Ambulatory Visit (INDEPENDENT_AMBULATORY_CARE_PROVIDER_SITE_OTHER): Payer: Medicaid Other | Admitting: Ophthalmology

## 2019-04-20 DIAGNOSIS — H3581 Retinal edema: Secondary | ICD-10-CM | POA: Diagnosis not present

## 2019-04-20 DIAGNOSIS — H25812 Combined forms of age-related cataract, left eye: Secondary | ICD-10-CM

## 2019-04-20 DIAGNOSIS — I10 Essential (primary) hypertension: Secondary | ICD-10-CM | POA: Diagnosis not present

## 2019-04-20 DIAGNOSIS — Z961 Presence of intraocular lens: Secondary | ICD-10-CM

## 2019-04-20 DIAGNOSIS — E113313 Type 2 diabetes mellitus with moderate nonproliferative diabetic retinopathy with macular edema, bilateral: Secondary | ICD-10-CM | POA: Diagnosis not present

## 2019-04-20 DIAGNOSIS — H35033 Hypertensive retinopathy, bilateral: Secondary | ICD-10-CM

## 2019-04-28 ENCOUNTER — Other Ambulatory Visit (HOSPITAL_COMMUNITY): Payer: Self-pay | Admitting: Internal Medicine

## 2019-04-28 ENCOUNTER — Encounter: Payer: Self-pay | Admitting: Internal Medicine

## 2019-04-28 ENCOUNTER — Ambulatory Visit: Payer: Medicaid Other | Admitting: Internal Medicine

## 2019-04-28 VITALS — BP 126/78 | HR 91 | Temp 98.9°F | Ht 72.0 in | Wt 212.3 lb

## 2019-04-28 DIAGNOSIS — I10 Essential (primary) hypertension: Secondary | ICD-10-CM | POA: Diagnosis not present

## 2019-04-28 DIAGNOSIS — Z79899 Other long term (current) drug therapy: Secondary | ICD-10-CM | POA: Diagnosis not present

## 2019-04-28 DIAGNOSIS — R42 Dizziness and giddiness: Secondary | ICD-10-CM | POA: Diagnosis not present

## 2019-04-28 DIAGNOSIS — Z89421 Acquired absence of other right toe(s): Secondary | ICD-10-CM

## 2019-04-28 DIAGNOSIS — Z8631 Personal history of diabetic foot ulcer: Secondary | ICD-10-CM

## 2019-04-28 DIAGNOSIS — E1169 Type 2 diabetes mellitus with other specified complication: Secondary | ICD-10-CM | POA: Diagnosis present

## 2019-04-28 DIAGNOSIS — L84 Corns and callosities: Secondary | ICD-10-CM | POA: Diagnosis not present

## 2019-04-28 DIAGNOSIS — E1165 Type 2 diabetes mellitus with hyperglycemia: Secondary | ICD-10-CM

## 2019-04-28 DIAGNOSIS — Z89411 Acquired absence of right great toe: Secondary | ICD-10-CM | POA: Diagnosis not present

## 2019-04-28 DIAGNOSIS — L97519 Non-pressure chronic ulcer of other part of right foot with unspecified severity: Secondary | ICD-10-CM

## 2019-04-28 DIAGNOSIS — E11621 Type 2 diabetes mellitus with foot ulcer: Secondary | ICD-10-CM

## 2019-04-28 DIAGNOSIS — E785 Hyperlipidemia, unspecified: Secondary | ICD-10-CM

## 2019-04-28 LAB — POCT GLYCOSYLATED HEMOGLOBIN (HGB A1C): Hemoglobin A1C: 12.3 % — AB (ref 4.0–5.6)

## 2019-04-28 LAB — GLUCOSE, CAPILLARY: Glucose-Capillary: 353 mg/dL — ABNORMAL HIGH (ref 70–99)

## 2019-04-28 MED ORDER — ATORVASTATIN CALCIUM 40 MG PO TABS: 40.0000 mg | ORAL_TABLET | Freq: Every day | ORAL | 0 refills | Status: DC

## 2019-04-28 MED ORDER — INSULIN DETEMIR 100 UNIT/ML ~~LOC~~ SOLN: 30.0000 [IU] | Freq: Two times a day (BID) | SUBCUTANEOUS | 7 refills | Status: DC

## 2019-04-28 MED ORDER — ACCU-CHEK GUIDE VI STRP: ORAL_STRIP | 3 refills | Status: DC

## 2019-04-28 MED ORDER — EMPAGLIFLOZIN 10 MG PO TABS: 10.0000 mg | ORAL_TABLET | Freq: Every day | ORAL | 3 refills | Status: DC

## 2019-04-28 MED ORDER — METFORMIN HCL 1000 MG PO TABS: ORAL_TABLET | ORAL | 3 refills | Status: DC

## 2019-04-28 MED ORDER — LOSARTAN POTASSIUM 25 MG PO TABS: 25.0000 mg | ORAL_TABLET | Freq: Every day | ORAL | 3 refills | Status: DC

## 2019-04-28 NOTE — Progress Notes (Signed)
CC: Diabetes  HPI: [name] is a 54 y.o. with PMH listed below presenting with complaint of management of his diabetes. Please see problem based assessment and plan for further details.  Past Medical History:  Diagnosis Date  . Arthritis    left knee  . Diabetes mellitus 2003  . Hyperlipidemia     Review of Systems: Review of Systems  Constitutional: Negative for chills, fever and malaise/fatigue.  Respiratory: Negative for shortness of breath.   Cardiovascular: Negative for chest pain, palpitations and leg swelling.  Gastrointestinal: Negative for constipation, diarrhea, nausea and vomiting.  Neurological: Positive for dizziness. Negative for tingling and headaches.     Physical Exam: Vitals:   04/28/19 1344 04/28/19 1357  BP: 131/89 126/78  Pulse: 91   Temp: 98.9 F (37.2 C)   TempSrc: Oral   SpO2: 100%   Weight: 212 lb 4.8 oz (96.3 kg)   Height: 6' (1.829 m)     Physical Exam  Constitutional: He is oriented to person, place, and time. He appears well-developed and well-nourished. No distress.  HENT:  Mouth/Throat: Oropharynx is clear and moist.  Eyes: Conjunctivae are normal.  Neck: No JVD present.  Cardiovascular: Normal rate, regular rhythm, normal heart sounds and intact distal pulses.  No murmur heard. Respiratory: Effort normal and breath sounds normal. He has no wheezes. He has no rales.  GI: Soft. Bowel sounds are normal. He exhibits no distension. There is no abdominal tenderness.  Musculoskeletal:        General: Deformity (R foot s/p transmetatarsal amputation) present. Normal range of motion.     Cervical back: Normal range of motion and neck supple.  Neurological: He is alert and oriented to person, place, and time.  Skin: Skin is warm and dry.   Assessment & Plan:   Hyperlipidemia associated with type 2 diabetes mellitus (HCC) Lipid Panel     Component Value Date/Time   CHOL 210 (H) 03/10/2018 1613   TRIG 192 (H) 03/10/2018 1613   HDL 41 03/10/2018 1613   CHOLHDL 5.1 (H) 03/10/2018 1613   CHOLHDL 3.3 08/23/2014 0909   VLDL 28 08/23/2014 0909   LDLCALC 131 (H) 03/10/2018 1613   LABVLDL 38 03/10/2018 1613    Currently on pravastatin 40mg  daily. Previous lipid panel show 10-year risk of 21% which would be considered high risk and indicated for high intensity statin. Unclear why pravastatin was continued. Will up-titrate regimen to atorvastatin 40mg  daily  - D/c pravastatin, Start atorvastatin 40mg  daily - Discussed dietary changes w/ DASH diet  Uncontrolled type II diabetes mellitus ([name]) Lab Results  Component Value Date   HGBA1C 12.3 (A) 04/28/2019   [name] presents to [name] for follow up for management of his diabetic management. He mentions that he has been doing well at home with continuing to take Metformin as prescribed. He also mentions that he has been forgetting to take his Levemir as prescribed but he tries his best to take it regularly. He states that he has not checked his blood sugar at home due to running out of strips. He is examined with his daughter and her daughter who states that he partakes in large amount of rice at home. Discussed in detail regarding correct foods to eat for his diabetes and medication adherence at home. His daughter states she will assist in this regard.  - C/w levemir 30 unis BID - C/w Metformin 1000 units BID - C/w low carb diet - New prescription for glucometer strips sent - Urine  Microalbumin-creatinine ratio - Will consider starting additional regimen if his a1c continues to be elevated at next visit  Diabetic foot ulcer ([name]) Has history of infected diabetic foot ulcer requiring hospitalization and transmetatarsal amputation. Foot exam performed in office today with intact sensation. Observed hard calluses on his left foot. Discussed need to see podiatry to prevent another re-occurrence. [name] is agreeable to the plan.  - Referral to podiatry  Essential  hypertension BP Readings from Last 3 Encounters:  04/28/19 126/78  01/20/19 125/71  01/13/19 (!) 150/84   Presents for follow up management of his essential hypertension. Currently on losartan 25mg  daily w/ no significant side effects. BP at goal today. Was previously told to stop but he states he takes it every day as prescribed.   - Bmp today to monitor electrolytes - C/w losartan 25mg  daily    Patient discussed with Dr. Daryll Hernandez   -[name], PGY2 [name] Pager: [phone]

## 2019-04-28 NOTE — Patient Instructions (Signed)
Thank you for allowing [place] to provide your care today. Today we discussed your diabetes    I have ordered bmp, lipid labs for you. I will call if any are abnormal.    Today we made the following changes to your medications.    STOP Pravastatin and START atorvastatin 40mg  daily START emaglifozin 10mg  daily Make sure to take your insulin and metformin as prescribed  Please follow-up in 3 months.    Should you have any questions or concerns please call the internal medicine clinic at [phone].     Diabetes Mellitus and Nutrition, Adult When you have diabetes (diabetes mellitus), it is very important to have healthy eating habits because your blood sugar (glucose) levels are greatly affected by what you eat and drink. Eating healthy foods in the appropriate amounts, at about the same times every day, can help you:  Control your blood glucose.  Lower your risk of heart disease.  Improve your blood pressure.  Reach or maintain a healthy weight. Every person with diabetes is different, and each person has different needs for a meal plan. Your health care provider may recommend that you work with a diet and nutrition specialist (dietitian) to make a meal plan that is best for you. Your meal plan may vary depending on factors such as:  The calories you need.  The medicines you take.  Your weight.  Your blood glucose, blood pressure, and cholesterol levels.  Your activity level.  Other health conditions you have, such as heart or kidney disease. How do carbohydrates affect me? Carbohydrates, also called carbs, affect your blood glucose level more than any other type of food. Eating carbs naturally raises the amount of glucose in your blood. Carb counting is a method for keeping track of how many carbs you eat. Counting carbs is important to keep your blood glucose at a healthy level, especially if you use insulin or take certain oral diabetes medicines. It is important to know how  many carbs you can safely have in each meal. This is different for every person. Your dietitian can help you calculate how many carbs you should have at each meal and for each snack. Foods that contain carbs include:  Bread, cereal, rice, pasta, and crackers.  Potatoes and corn.  Peas, beans, and lentils.  Milk and yogurt.  Fruit and juice.  Desserts, such as cakes, cookies, ice cream, and candy. How does alcohol affect me? Alcohol can cause a sudden decrease in blood glucose (hypoglycemia), especially if you use insulin or take certain oral diabetes medicines. Hypoglycemia can be a life-threatening condition. Symptoms of hypoglycemia (sleepiness, dizziness, and confusion) are similar to symptoms of having too much alcohol. If your health care provider says that alcohol is safe for you, follow these guidelines:  Limit alcohol intake to no more than 1 drink per day for nonpregnant women and 2 drinks per day for men. One drink equals 12 oz of beer, 5 oz of wine, or 1 oz of hard liquor.  Do not drink on an empty stomach.  Keep yourself hydrated with water, diet soda, or unsweetened iced tea.  Keep in mind that regular soda, juice, and other mixers may contain a lot of sugar and must be counted as carbs. What are tips for following this plan?  Reading food labels  Start by checking the serving size on the "Nutrition Facts" label of packaged foods and drinks. The amount of calories, carbs, fats, and other nutrients listed on the label is  based on one serving of the item. Many items contain more than one serving per package.  Check the total grams (g) of carbs in one serving. You can calculate the number of servings of carbs in one serving by dividing the total carbs by 15. For example, if a food has 30 g of total carbs, it would be equal to 2 servings of carbs.  Check the number of grams (g) of saturated and trans fats in one serving. Choose foods that have low or no amount of these  fats.  Check the number of milligrams (mg) of salt (sodium) in one serving. Most people should limit total sodium intake to less than 2,300 mg per day.  Always check the nutrition information of foods labeled as "low-fat" or "nonfat". These foods may be higher in added sugar or refined carbs and should be avoided.  Talk to your dietitian to identify your daily goals for nutrients listed on the label. Shopping  Avoid buying canned, premade, or processed foods. These foods tend to be high in fat, sodium, and added sugar.  Shop around the outside edge of the grocery store. This includes fresh fruits and vegetables, bulk grains, fresh meats, and fresh dairy. Cooking  Use low-heat cooking methods, such as baking, instead of high-heat cooking methods like deep frying.  Cook using healthy oils, such as olive, canola, or sunflower oil.  Avoid cooking with butter, cream, or high-fat meats. Meal planning  Eat meals and snacks regularly, preferably at the same times every day. Avoid going long periods of time without eating.  Eat foods high in fiber, such as fresh fruits, vegetables, beans, and whole grains. Talk to your dietitian about how many servings of carbs you can eat at each meal.  Eat 4-6 ounces (oz) of lean protein each day, such as lean meat, chicken, fish, eggs, or tofu. One oz of lean protein is equal to: ? 1 oz of meat, chicken, or fish. ? 1 egg. ?  cup of tofu.  Eat some foods each day that contain healthy fats, such as avocado, nuts, seeds, and fish. Lifestyle  Check your blood glucose regularly.  Exercise regularly as told by your health care provider. This may include: ? 150 minutes of moderate-intensity or vigorous-intensity exercise each week. This could be brisk walking, biking, or water aerobics. ? Stretching and doing strength exercises, such as yoga or weightlifting, at least 2 times a week.  Take medicines as told by your health care provider.  Do not use any  products that contain nicotine or tobacco, such as cigarettes and e-cigarettes. If you need help quitting, ask your health care provider.  Work with a Social worker or diabetes educator to identify strategies to manage stress and any emotional and social challenges. Questions to ask a health care provider  Do I need to meet with a diabetes educator?  Do I need to meet with a dietitian?  What number can I call if I have questions?  When are the best times to check my blood glucose? Where to find more information:  American Diabetes Association: diabetes.org  Academy of Nutrition and Dietetics: www.eatright.[employer] of Diabetes and Digestive and Kidney Diseases (NIH): [employer] Summary  A healthy meal plan will help you control your blood glucose and maintain a healthy lifestyle.  Working with a diet and nutrition specialist (dietitian) can help you make a meal plan that is best for you.  Keep in mind that carbohydrates (carbs) and alcohol have immediate effects  on your blood glucose levels. It is important to count carbs and to use alcohol carefully. This information is not intended to replace advice given to you by your health care provider. Make sure you discuss any questions you have with your health care provider. Document Revised: 12/28/2016 Document Reviewed: 02/20/2016 Elsevier Patient Education  2020 [employer].

## 2019-04-29 ENCOUNTER — Encounter: Payer: Self-pay | Admitting: Internal Medicine

## 2019-04-29 LAB — MICROALBUMIN / CREATININE URINE RATIO
Creatinine, Urine: 90.7 mg/dL
Microalb/Creat Ratio: 13 mg/g creat (ref 0–29)
Microalbumin, Urine: 11.5 ug/mL

## 2019-04-29 NOTE — Assessment & Plan Note (Signed)
Lipid Panel     Component Value Date/Time   CHOL 210 (H) 03/10/2018 1613   TRIG 192 (H) 03/10/2018 1613   HDL 41 03/10/2018 1613   CHOLHDL 5.1 (H) 03/10/2018 1613   CHOLHDL 3.3 08/23/2014 0909   VLDL 28 08/23/2014 0909   LDLCALC 131 (H) 03/10/2018 1613   LABVLDL 38 03/10/2018 1613    Currently on pravastatin 40mg  daily. Previous lipid panel show 10-year risk of 21% which would be considered high risk and indicated for high intensity statin. Unclear why pravastatin was continued. Will up-titrate regimen to atorvastatin 40mg  daily  - D/c pravastatin, Start atorvastatin 40mg  daily - Discussed dietary changes w/ DASH diet

## 2019-04-29 NOTE — Assessment & Plan Note (Signed)
BP Readings from Last 3 Encounters:  04/28/19 126/78  01/20/19 125/71  01/13/19 (!) 150/84   Presents for follow up management of his essential hypertension. Currently on losartan 25mg  daily w/ no significant side effects. BP at goal today. Was previously told to stop but he states he takes it every day as prescribed.   - Bmp today to monitor electrolytes - C/w losartan 25mg  daily

## 2019-04-29 NOTE — Assessment & Plan Note (Signed)
Has history of infected diabetic foot ulcer requiring hospitalization and transmetatarsal amputation. Foot exam performed in office today with intact sensation. Observed hard calluses on his left foot. Discussed need to see podiatry to prevent another re-occurrence. [name] is agreeable to the plan.  - Referral to podiatry

## 2019-04-29 NOTE — Assessment & Plan Note (Signed)
Lab Results  Component Value Date   HGBA1C 12.3 (A) 04/28/2019   [name] presents to [facility] for follow up for management of his diabetic management. He mentions that he has been doing well at home with continuing to take Metformin as prescribed. He also mentions that he has been forgetting to take his Levemir as prescribed but he tries his best to take it regularly. He states that he has not checked his blood sugar at home due to running out of strips. He is examined with his daughter and her daughter who states that he partakes in large amount of rice at home. Discussed in detail regarding correct foods to eat for his diabetes and medication adherence at home. His daughter states she will assist in this regard.  - C/w levemir 30 unis BID - C/w Metformin 1000 units BID - C/w low carb diet - New prescription for glucometer strips sent - Urine Microalbumin-creatinine ratio - Will consider starting additional regimen if his a1c continues to be elevated at next visit

## 2019-04-30 LAB — LIPID PANEL
Chol/HDL Ratio: 5.7 ratio — ABNORMAL HIGH (ref 0.0–5.0)
Cholesterol, Total: 215 mg/dL — ABNORMAL HIGH (ref 100–199)
HDL: 38 mg/dL — ABNORMAL LOW (ref >39)
LDL Chol Calc (NIH): 121 mg/dL — ABNORMAL HIGH (ref 0–99)
Triglycerides: 319 mg/dL — ABNORMAL HIGH (ref 0–149)
VLDL Cholesterol Cal: 56 mg/dL — ABNORMAL HIGH (ref 5–40)

## 2019-04-30 LAB — BMP8+ANION GAP
Anion Gap: 14 mmol/L (ref 10.0–18.0)
BUN/Creatinine Ratio: 17 (ref 9–20)
BUN: 23 mg/dL (ref 6–24)
CO2: 20 mmol/L (ref 20–29)
Calcium: 9.2 mg/dL (ref 8.7–10.2)
Chloride: 103 mmol/L (ref 96–106)
Creatinine, Ser: 1.37 mg/dL — ABNORMAL HIGH (ref 0.76–1.27)
GFR calc Af Amer: 67 mL/min/{1.73_m2} (ref >59)
GFR calc non Af Amer: 58 mL/min/{1.73_m2} — ABNORMAL LOW (ref >59)
Glucose: 413 mg/dL — ABNORMAL HIGH (ref 65–99)
Potassium: 5.3 mmol/L — ABNORMAL HIGH (ref 3.5–5.2)
Sodium: 137 mmol/L (ref 134–144)

## 2019-05-05 NOTE — Progress Notes (Signed)
Internal Medicine Clinic Attending ° °Case discussed with Dr. Lee at the time of the visit.  We reviewed the resident’s history and exam and pertinent patient test results.  I agree with the assessment, diagnosis, and plan of care documented in the resident’s note.  °

## 2019-05-07 MED FILL — ATORVASTATIN 40 MG TABLET: 40 | 30 days supply | Qty: 30 | Fill #0

## 2019-05-07 MED FILL — LEVEMIR 100 UNITS/ML VIAL: 100 | 33 days supply | Qty: 20 | Fill #0

## 2019-05-07 MED FILL — JARDIANCE 10 MG TABLET: 10 | 30 days supply | Qty: 30 | Fill #0

## 2019-05-07 MED FILL — ACCU-CHEK GUIDE TEST STRIP: 25 days supply | Qty: 100 | Fill #0

## 2019-05-08 MED FILL — KETOROLAC 0.4% OPHTH SOLN: 0.4 | 18 days supply | Qty: 5 | Fill #1

## 2019-05-08 MED FILL — PREDNISOLONE AC 1% EYE DROP: 1 | 37 days supply | Qty: 10 | Fill #1

## 2019-05-08 MED FILL — OFLOXACIN 0.3% EYE DROPS: 0.3 | 18 days supply | Qty: 5 | Fill #1

## 2019-05-18 ENCOUNTER — Encounter: Payer: Self-pay | Admitting: Podiatrist

## 2019-05-18 ENCOUNTER — Ambulatory Visit (INDEPENDENT_AMBULATORY_CARE_PROVIDER_SITE_OTHER): Payer: Medicaid Other | Admitting: Podiatrist

## 2019-05-18 ENCOUNTER — Other Ambulatory Visit: Payer: Self-pay

## 2019-05-18 ENCOUNTER — Encounter: Payer: Medicaid Other | Admitting: Internal Medicine

## 2019-05-18 VITALS — Temp 97.9°F

## 2019-05-18 DIAGNOSIS — B351 Tinea unguium: Secondary | ICD-10-CM

## 2019-05-18 DIAGNOSIS — Z89431 Acquired absence of right foot: Secondary | ICD-10-CM | POA: Diagnosis not present

## 2019-05-18 DIAGNOSIS — E1140 Type 2 diabetes mellitus with diabetic neuropathy, unspecified: Secondary | ICD-10-CM | POA: Diagnosis not present

## 2019-05-18 NOTE — Progress Notes (Signed)
  Chief Complaint  Patient presents with  . Diabetes    Diabetic Foot Exam/Nail Debridement L; "nails are thick, dark, and hard to cut; type 2 diabetic (12.3a1c)"     HPI: Patient is 54 y.o. male who presents today for the concerns as listed above.  He had a transmetatarsal amputation on the right foot in January 2021 due to an infected ulcer (from notes).  He would like the toenails on the left foot trimmed today.    Review of Systems No fevers, chills, nausea, muscle aches, no difficulty breathing, no calf pain, no chest pain or shortness of breath.   Physical Exam  GENERAL APPEARANCE: Alert, conversant. Appropriately groomed. No acute distress.   VASCULAR: Pedal pulses non palpable DP and palpable PT bilateral.  Capillary refill time is decreased to the left digits,  Proximal to distal cooling it warm to warm.  Digital hair growth is absent left.   NEUROLOGIC: sensation is decreased epicritically and protectively to 5.07 monofilament at 0/5 sites left.   Light touch is decreased bilateral, vibratory sensation decreased bilateral. MUSCULOSKELETAL: acceptable muscle strength, tone and stability bilateral.  Intrinsic muscluature intact bilateral.  Range of motion at ankle and first MPJ is normal bilateral.   DERMATOLOGIC:  Left foot skin is warm, supple, and dry.  No open lesions noted.  No interdigital maceration noted left foot Digital nails are thick, discolored, dystrophic 1-5 left foot.  Small hyperkeratotic lesion present left fifth digit.  No other pre ulcerative lesions seen.  Transmetatarsal amputation on the right foot has healed nicely.  Suture line is well healed.  Skin is supple and soft right.   Assessment    ICD-10-CM   1. History of transmetatarsal amputation of right foot ([place])  Z89.431   2. Neuropathy due to type 2 diabetes mellitus (HCC)  E11.40   3. Onychomycosis  B35.1      Plan  Debridement of toenails was recommended.  Onychoreduction of left foot 1-5  toenails was performed via nail nipper and power burr without iatrogenic incident.  Patient was instructed on signs and symptoms of infection and was told to call immediately should any of these arise.  We will also check on getting a filler for his shoe s/p transmet amputation on the right. He will return for at risk foot care in 3 months.

## 2019-05-18 NOTE — Patient Instructions (Addendum)
We will call and check your insurance benefits for a filler for your right shoe-  We will call you when we know and if they are covered we will make an appointment to see you back to be measured for the filler.    Diabetes Mellitus and Foot Care Foot care is an important part of your health, especially when you have diabetes. Diabetes may cause you to have problems because of poor blood flow (circulation) to your feet and legs, which can cause your skin to:  Become thinner and drier.  Break more easily.  Heal more slowly.  Peel and crack. You may also have nerve damage (neuropathy) in your legs and feet, causing decreased feeling in them. This means that you may not notice minor injuries to your feet that could lead to more serious problems. Noticing and addressing any potential problems early is the best way to prevent future foot problems. How to care for your feet Foot hygiene  Wash your feet daily with warm water and mild soap. Do not use hot water. Then, pat your feet and the areas between your toes until they are completely dry. Do not soak your feet as this can dry your skin.  Trim your toenails straight across. Do not dig under them or around the cuticle. File the edges of your nails with an emery board or nail file.  Apply a moisturizing lotion or petroleum jelly to the skin on your feet and to dry, brittle toenails. Use lotion that does not contain alcohol and is unscented. Do not apply lotion between your toes. Shoes and socks  Wear clean socks or stockings every day. Make sure they are not too tight. Do not wear knee-high stockings since they may decrease blood flow to your legs.  Wear shoes that fit properly and have enough cushioning. Always look in your shoes before you put them on to be sure there are no objects inside.  To break in new shoes, wear them for just a few hours a day. This prevents injuries on your feet. Wounds, scrapes, corns, and calluses  Check your  feet daily for blisters, cuts, bruises, sores, and redness. If you cannot see the bottom of your feet, use a mirror or ask someone for help.  Do not cut corns or calluses or try to remove them with medicine.  If you find a minor scrape, cut, or break in the skin on your feet, keep it and the skin around it clean and dry. You may clean these areas with mild soap and water. Do not clean the area with peroxide, alcohol, or iodine.  If you have a wound, scrape, corn, or callus on your foot, look at it several times a day to make sure it is healing and not infected. Check for: ? Redness, swelling, or pain. ? Fluid or blood. ? Warmth. ? Pus or a bad smell. General instructions  Do not cross your legs. This may decrease blood flow to your feet.  Do not use heating pads or hot water bottles on your feet. They may burn your skin. If you have lost feeling in your feet or legs, you may not know this is happening until it is too late.  Protect your feet from hot and cold by wearing shoes, such as at the beach or on hot pavement.  Schedule a complete foot exam at least once a year (annually) or more often if you have foot problems. If you have foot problems, report any cuts,  sores, or bruises to your health care provider immediately. Contact a health care provider if:  You have a medical condition that increases your risk of infection and you have any cuts, sores, or bruises on your feet.  You have an injury that is not healing.  You have redness on your legs or feet.  You feel burning or tingling in your legs or feet.  You have pain or cramps in your legs and feet.  Your legs or feet are numb.  Your feet always feel cold.  You have pain around a toenail. Get help right away if:  You have a wound, scrape, corn, or callus on your foot and: ? You have pain, swelling, or redness that gets worse. ? You have fluid or blood coming from the wound, scrape, corn, or callus. ? Your wound, scrape,  corn, or callus feels warm to the touch. ? You have pus or a bad smell coming from the wound, scrape, corn, or callus. ? You have a fever. ? You have a red line going up your leg. Summary  Check your feet every day for cuts, sores, red spots, swelling, and blisters.  Moisturize feet and legs daily.  Wear shoes that fit properly and have enough cushioning.  If you have foot problems, report any cuts, sores, or bruises to your health care provider immediately.  Schedule a complete foot exam at least once a year (annually) or more often if you have foot problems. This information is not intended to replace advice given to you by your health care provider. Make sure you discuss any questions you have with your health care provider. Document Revised: 10/08/2018 Document Reviewed: 02/17/2016 Elsevier Patient Education  [place].

## 2019-05-29 ENCOUNTER — Other Ambulatory Visit: Payer: Medicaid Other | Admitting: Orthotics

## 2019-06-09 ENCOUNTER — Encounter: Payer: Medicaid Other | Admitting: Internal Medicine

## 2019-06-23 ENCOUNTER — Telehealth: Payer: Self-pay

## 2019-06-23 ENCOUNTER — Encounter: Payer: Medicaid Other | Admitting: Internal Medicine

## 2019-06-23 NOTE — Telephone Encounter (Signed)
Missed Appointment not able to leave message Silverio Decamp C5/25/20214:25 PM

## 2019-06-24 ENCOUNTER — Encounter: Payer: Self-pay | Admitting: Internal Medicine

## 2019-06-30 MED FILL — metFORMIN HCL 1000 MG TABS: 1000 | 30 days supply | Qty: 60 | Fill #1

## 2019-06-30 MED FILL — PRAVASTATIN NA 40 MG TAB: 40 | 30 days supply | Qty: 30 | Fill #4

## 2019-06-30 MED FILL — LOSARTAN POTASSIUM 25 MG TA: 25 | 30 days supply | Qty: 30 | Fill #2

## 2019-06-30 MED FILL — LEVEMIR 100 UNITS/ML VIAL: 100 | 33 days supply | Qty: 20 | Fill #1

## 2019-07-14 ENCOUNTER — Other Ambulatory Visit: Payer: Self-pay | Admitting: *Deleted

## 2019-07-14 DIAGNOSIS — E1165 Type 2 diabetes mellitus with hyperglycemia: Secondary | ICD-10-CM

## 2019-07-14 NOTE — Telephone Encounter (Signed)
Fax from [facility] - need refill on Ulticare syringes 0.50ml  31 G x 5/16, qty # 100 "Use with insulin 2 times daily". Thanks

## 2019-07-15 MED ORDER — "INSULIN SYRINGE-NEEDLE U-100 31G X 5/16"" 0.5 ML MISC": 1.0000 | 5 refills | Status: DC | PRN

## 2019-07-15 MED FILL — ULTICARE SYR 0.5 ML 31GX5/1: 31G X 5/16" | 50 days supply | Qty: 100 | Fill #0

## 2019-07-21 ENCOUNTER — Encounter (INDEPENDENT_AMBULATORY_CARE_PROVIDER_SITE_OTHER): Payer: Medicaid Other | Admitting: Ophthalmology

## 2019-08-17 ENCOUNTER — Ambulatory Visit (INDEPENDENT_AMBULATORY_CARE_PROVIDER_SITE_OTHER): Payer: Medicaid Other | Admitting: Internal Medicine

## 2019-08-17 ENCOUNTER — Other Ambulatory Visit (HOSPITAL_COMMUNITY): Payer: Self-pay | Admitting: Internal Medicine

## 2019-08-17 ENCOUNTER — Encounter: Payer: Self-pay | Admitting: Internal Medicine

## 2019-08-17 VITALS — BP 136/84 | HR 83 | Temp 98.3°F | Ht 72.0 in | Wt 213.5 lb

## 2019-08-17 DIAGNOSIS — Z794 Long term (current) use of insulin: Secondary | ICD-10-CM | POA: Diagnosis not present

## 2019-08-17 DIAGNOSIS — E1165 Type 2 diabetes mellitus with hyperglycemia: Secondary | ICD-10-CM

## 2019-08-17 DIAGNOSIS — I10 Essential (primary) hypertension: Secondary | ICD-10-CM

## 2019-08-17 DIAGNOSIS — E1169 Type 2 diabetes mellitus with other specified complication: Secondary | ICD-10-CM

## 2019-08-17 DIAGNOSIS — E11621 Type 2 diabetes mellitus with foot ulcer: Secondary | ICD-10-CM

## 2019-08-17 DIAGNOSIS — E785 Hyperlipidemia, unspecified: Secondary | ICD-10-CM

## 2019-08-17 LAB — POCT GLYCOSYLATED HEMOGLOBIN (HGB A1C): Hemoglobin A1C: 11.8 % — AB (ref 4.0–5.6)

## 2019-08-17 LAB — GLUCOSE, CAPILLARY: Glucose-Capillary: 341 mg/dL — ABNORMAL HIGH (ref 70–99)

## 2019-08-17 MED ORDER — METFORMIN HCL 1000 MG PO TABS: ORAL_TABLET | ORAL | 3 refills | Status: DC

## 2019-08-17 MED ORDER — LOSARTAN POTASSIUM-HCTZ 50-12.5 MG PO TABS: 1.0000 | ORAL_TABLET | Freq: Every day | ORAL | 3 refills | Status: DC

## 2019-08-17 MED ORDER — CANAGLIFLOZIN 300 MG PO TABS: 300.0000 mg | ORAL_TABLET | Freq: Every day | ORAL | 3 refills | Status: DC

## 2019-08-17 MED ORDER — LEVEMIR FLEXTOUCH 100 UNIT/ML ~~LOC~~ SOPN: 30.0000 [IU] | PEN_INJECTOR | Freq: Two times a day (BID) | SUBCUTANEOUS | 7 refills | Status: DC

## 2019-08-17 MED ORDER — ATORVASTATIN CALCIUM 40 MG PO TABS: 40.0000 mg | ORAL_TABLET | Freq: Every day | ORAL | 3 refills | Status: DC

## 2019-08-17 MED ORDER — INSULIN PEN NEEDLE 31G X 5 MM MISC: 1.0000 [IU] | 11 refills | Status: DC

## 2019-08-17 MED FILL — ATORVASTATIN 40 MG TABLET: 40 | 90 days supply | Qty: 90 | Fill #0

## 2019-08-17 MED FILL — UNIFINE PENTIPS 31GX3/16: 31G X 5 MM | 34 days supply | Qty: 100 | Fill #0

## 2019-08-17 MED FILL — metFORMIN HCL 1000 MG TABS: 1000 | 90 days supply | Qty: 180 | Fill #0

## 2019-08-17 MED FILL — LOSARTAN-HCTZ 50-12.5 MG TA: 50-12.5 | 30 days supply | Qty: 30 | Fill #0

## 2019-08-17 MED FILL — LEVEMIR FLEXTOUCH 100 UNITS: 100 | 25 days supply | Qty: 15 | Fill #0

## 2019-08-17 NOTE — Assessment & Plan Note (Addendum)
Lab Results  Component Value Date   HGBA1C 11.8 (A) 08/17/2019   [name] is a 55 yo M w/ PMH of HTN, HLD, uncontrolled T2Dm with neuropathy presenting to [facility] for management of his chronic diabetes. He mentions that he had been taking his diabetic regimen as prescribed until about a month ago when he visited [place] and left his insulin there. Afterwards, it took some time for him to pick up his insulin again and he was without insulin for about a week. He mentions that otherwise he is taking his medications as prescribed. He denies any nausea, vomiting, abdominal pain.  A/p Slight improvement in glycemic control from hgb a1c of 12.3->11.8. Personal glucometer reviewed with elevated blood sugars most notably near the end of the month with hyperglycemic events occurring mostly around 8am. Currently on metformin, empagliflozin 10mg  and levemir 30 units BID. Need to change regimen to long acting + short acting but health literacy is low. Chart review shows adverse reaction to short acting regimen in the past. Will try to discuss with family and confirm insulin regimen at home prior to adjustments. Meanwhile, will up-titrate oral meds  - Canagliflozin 300mg  (empagliflozin 25mg  not on preferred formulary) - C/w metformin 1000mg  BID - C/w Levemir 30 units BID (will contact family to confirm med-adherence at home prior to adjustment) - Advised on importance of medication adherence - Referral to diabetes education

## 2019-08-17 NOTE — Patient Instructions (Addendum)
Thank you for allowing [place] to provide your care today. Today we discussed your diabetes    I have ordered no labs for you. I will call if any are abnormal.    Today we made the following changes to your medications.    Your losartan 25mg  was changed to losartan-hctz 50-12.5mg  Your Levemir was changed to levemir flexpen Your empagliflozin was changed to canagliflozin 300mg  daily  Please follow-up in 2 weeks.    Should you have any questions or concerns please call the internal medicine clinic at [phone].     Diabetes Mellitus and Nutrition, Adult When you have diabetes (diabetes mellitus), it is very important to have healthy eating habits because your blood sugar (glucose) levels are greatly affected by what you eat and drink. Eating healthy foods in the appropriate amounts, at about the same times every day, can help you:  Control your blood glucose.  Lower your risk of heart disease.  Improve your blood pressure.  Reach or maintain a healthy weight. Every person with diabetes is different, and each person has different needs for a meal plan. Your health care provider may recommend that you work with a diet and nutrition specialist (dietitian) to make a meal plan that is best for you. Your meal plan may vary depending on factors such as:  The calories you need.  The medicines you take.  Your weight.  Your blood glucose, blood pressure, and cholesterol levels.  Your activity level.  Other health conditions you have, such as heart or kidney disease. How do carbohydrates affect me? Carbohydrates, also called carbs, affect your blood glucose level more than any other type of food. Eating carbs naturally raises the amount of glucose in your blood. Carb counting is a method for keeping track of how many carbs you eat. Counting carbs is important to keep your blood glucose at a healthy level, especially if you use insulin or take certain oral diabetes medicines. It is important  to know how many carbs you can safely have in each meal. This is different for every person. Your dietitian can help you calculate how many carbs you should have at each meal and for each snack. Foods that contain carbs include:  Bread, cereal, rice, pasta, and crackers.  Potatoes and corn.  Peas, beans, and lentils.  Milk and yogurt.  Fruit and juice.  Desserts, such as cakes, cookies, ice cream, and candy. How does alcohol affect me? Alcohol can cause a sudden decrease in blood glucose (hypoglycemia), especially if you use insulin or take certain oral diabetes medicines. Hypoglycemia can be a life-threatening condition. Symptoms of hypoglycemia (sleepiness, dizziness, and confusion) are similar to symptoms of having too much alcohol. If your health care provider says that alcohol is safe for you, follow these guidelines:  Limit alcohol intake to no more than 1 drink per day for nonpregnant women and 2 drinks per day for men. One drink equals 12 oz of beer, 5 oz of wine, or 1 oz of hard liquor.  Do not drink on an empty stomach.  Keep yourself hydrated with water, diet soda, or unsweetened iced tea.  Keep in mind that regular soda, juice, and other mixers may contain a lot of sugar and must be counted as carbs. What are tips for following this plan?  Reading food labels  Start by checking the serving size on the "Nutrition Facts" label of packaged foods and drinks. The amount of calories, carbs, fats, and other nutrients listed on the label  is based on one serving of the item. Many items contain more than one serving per package.  Check the total grams (g) of carbs in one serving. You can calculate the number of servings of carbs in one serving by dividing the total carbs by 15. For example, if a food has 30 g of total carbs, it would be equal to 2 servings of carbs.  Check the number of grams (g) of saturated and trans fats in one serving. Choose foods that have low or no amount  of these fats.  Check the number of milligrams (mg) of salt (sodium) in one serving. Most people should limit total sodium intake to less than 2,300 mg per day.  Always check the nutrition information of foods labeled as "low-fat" or "nonfat". These foods may be higher in added sugar or refined carbs and should be avoided.  Talk to your dietitian to identify your daily goals for nutrients listed on the label. Shopping  Avoid buying canned, premade, or processed foods. These foods tend to be high in fat, sodium, and added sugar.  Shop around the outside edge of the grocery store. This includes fresh fruits and vegetables, bulk grains, fresh meats, and fresh dairy. Cooking  Use low-heat cooking methods, such as baking, instead of high-heat cooking methods like deep frying.  Cook using healthy oils, such as olive, canola, or sunflower oil.  Avoid cooking with butter, cream, or high-fat meats. Meal planning  Eat meals and snacks regularly, preferably at the same times every day. Avoid going long periods of time without eating.  Eat foods high in fiber, such as fresh fruits, vegetables, beans, and whole grains. Talk to your dietitian about how many servings of carbs you can eat at each meal.  Eat 4-6 ounces (oz) of lean protein each day, such as lean meat, chicken, fish, eggs, or tofu. One oz of lean protein is equal to: ? 1 oz of meat, chicken, or fish. ? 1 egg. ?  cup of tofu.  Eat some foods each day that contain healthy fats, such as avocado, nuts, seeds, and fish. Lifestyle  Check your blood glucose regularly.  Exercise regularly as told by your health care provider. This may include: ? 150 minutes of moderate-intensity or vigorous-intensity exercise each week. This could be brisk walking, biking, or water aerobics. ? Stretching and doing strength exercises, such as yoga or weightlifting, at least 2 times a week.  Take medicines as told by your health care provider.  Do not  use any products that contain nicotine or tobacco, such as cigarettes and e-cigarettes. If you need help quitting, ask your health care provider.  Work with a Social worker or diabetes educator to identify strategies to manage stress and any emotional and social challenges. Questions to ask a health care provider  Do I need to meet with a diabetes educator?  Do I need to meet with a dietitian?  What number can I call if I have questions?  When are the best times to check my blood glucose? Where to find more information:  American Diabetes Association: diabetes.org  Academy of Nutrition and Dietetics: www.eatright.[employer] of Diabetes and Digestive and Kidney Diseases (NIH): [employer] Summary  A healthy meal plan will help you control your blood glucose and maintain a healthy lifestyle.  Working with a diet and nutrition specialist (dietitian) can help you make a meal plan that is best for you.  Keep in mind that carbohydrates (carbs) and alcohol have immediate  effects on your blood glucose levels. It is important to count carbs and to use alcohol carefully. This information is not intended to replace advice given to you by your health care provider. Make sure you discuss any questions you have with your health care provider. Document Revised: 12/28/2016 Document Reviewed: 02/20/2016 Elsevier Patient Education  2020 [employer].

## 2019-08-17 NOTE — Assessment & Plan Note (Signed)
BP Readings from Last 3 Encounters:  08/17/19 136/84  04/28/19 126/78  01/20/19 125/71   BP above goal this visit. Mentions taking his bp meds this am. On losartan 25mg  daily. Denies any significant side effects.   - Start losartan-hctz 50-12.5mg  daily

## 2019-08-17 NOTE — Assessment & Plan Note (Signed)
Hx of diabetic foot ulcer s/p transmetatarsal amputation. Foot exam today with appearance of callous with diminished sensation on R plantar surface. Noted that he has an upcoming appointment with podiatry on 08/28/19  - Advised on importance of foot care at home - F/u with podiatry

## 2019-08-17 NOTE — Progress Notes (Signed)
CC: High blood sugar  HPI: [name] is a 54 y.o. with PMH listed below presenting for management of his diabetes. Please see problem based assessment and plan for further details.  Past Medical History:  Diagnosis Date  . Arthritis    left knee  . Diabetes mellitus 2003  . Hyperlipidemia     Review of Systems: Review of Systems  Constitutional: Negative for chills, fever and malaise/fatigue.  Eyes: Negative for blurred vision.  Respiratory: Negative for shortness of breath.   Cardiovascular: Negative for chest pain and leg swelling.  Gastrointestinal: Negative for constipation, diarrhea, nausea and vomiting.  Genitourinary: Negative for dysuria.  All other systems reviewed and are negative.    Physical Exam: Vitals:   08/17/19 1052 08/17/19 1114  BP: (!) 160/88 136/84  Pulse: 83   Temp: 98.3 F (36.8 C)   TempSrc: Oral   SpO2: 98%   Weight: 213 lb 8 oz (96.8 kg)   Height: 6' (1.829 m)    Gen: Well-developed, well nourished, NAD HEENT: NCAT head, hearing intact CV: RRR, S1, S2 normal Pulm: CTAB, No rales, no wheezes Extm: ROM intact, Diminished peripheral pulses, Stable R transmetatarsal amputation, trace ankle pitting edema Skin: Dry, Warm, normal turgor, no wounds, no rashes, no lesions  Assessment & Plan:   Essential hypertension BP Readings from Last 3 Encounters:  08/17/19 136/84  04/28/19 126/78  01/20/19 125/71   BP above goal this visit. Mentions taking his bp meds this am. On losartan 25mg  daily. Denies any significant side effects.   - Start losartan-hctz 50-12.5mg  daily  Hyperlipidemia associated with type 2 diabetes mellitus ([place]) Pt requires refills on medications with associated diagnosis above.  Reviewed disease process and find this medication to be necessary, will not change dose or alter current therapy.   Diabetic foot ulcer ([place]) Hx of diabetic foot ulcer s/p transmetatarsal amputation. Foot exam today with appearance of callous  with diminished sensation on R plantar surface. Noted that he has an upcoming appointment with podiatry on 08/28/19  - Advised on importance of foot care at home - F/u with podiatry  Uncontrolled type II diabetes mellitus [facility] Lab Results  Component Value Date   HGBA1C 11.8 (A) 08/17/2019   [name] is a 53 yo M w/ PMH of HTN, HLD, uncontrolled T2Dm with neuropathy presenting to [facility] for management of his chronic diabetes. He mentions that he had been taking his diabetic regimen as prescribed until about a month ago when he visited [place] and left his insulin there. Afterwards, it took some time for him to pick up his insulin again and he was without insulin for about a week. He mentions that otherwise he is taking his medications as prescribed. He denies any nausea, vomiting, abdominal pain.  A/p Slight improvement in glycemic control from hgb a1c of 12.3->11.8. Personal glucometer reviewed with elevated blood sugars most notably near the end of the month with hyperglycemic events occurring mostly around 8am. Currently on metformin, empagliflozin 10mg  and levemir 30 units BID. Need to change regimen to long acting + short acting but health literacy is low. Chart review shows adverse reaction to short acting regimen in the past. Will try to discuss with family and confirm insulin regimen at home prior to adjustments. Meanwhile, will up-titrate oral meds  - Canagliflozin 300mg  (empagliflozin 25mg  not on preferred formulary) - C/w metformin 1000mg  BID - C/w Levemir 30 units BID (will contact family to confirm med-adherence at home prior to adjustment) - Advised on importance  of medication adherence - Referral to diabetes education    Patient discussed with Dr. Dareen Piano  -Gilberto Better, [facility] Pager: [phone]

## 2019-08-17 NOTE — Progress Notes (Signed)
1/18

## 2019-08-17 NOTE — Assessment & Plan Note (Signed)
Pt requires refills on medications with associated diagnosis above.  Reviewed disease process and find this medication to be necessary, will not change dose or alter current therapy. 

## 2019-08-18 NOTE — Progress Notes (Signed)
Internal Medicine Clinic Attending  Case discussed with Dr. Lee  At the time of the visit.  We reviewed the resident's history and exam and pertinent patient test results.  I agree with the assessment, diagnosis, and plan of care documented in the resident's note.    

## 2019-08-19 ENCOUNTER — Telehealth: Payer: Self-pay | Admitting: *Deleted

## 2019-08-19 NOTE — Telephone Encounter (Addendum)
Information was sent to [place] for PA for Invokana 300 mg.  Ref # [id number].  Awaiting determination within 24 hours via fax.  Sander Nephew, RN 08/19/2019 9:03 AM. PA for Invokana approved 08/19/2019 thru 08/18/2020.  Pharmacy aware.  Sander Nephew, RN 08/25/2019 9:03 AM.

## 2019-08-20 MED FILL — INVOKANA 300 MG TABLET: 300 | 30 days supply | Qty: 30 | Fill #0

## 2019-08-21 ENCOUNTER — Telehealth: Payer: Self-pay | Admitting: *Deleted

## 2019-08-21 NOTE — Telephone Encounter (Signed)
Patient's daughter called in with concerns over info she received from Pharmacy on Invokana. States that this med is not advised in patient with amputations. Would like a call back from Dr. Truman Hayward to discuss this. Hubbard Hartshorn, BSN, RN-BC

## 2019-08-21 NOTE — Telephone Encounter (Signed)
Spoke with Mr.Tigue's daughter regarding their concern about canagliflozin and its association with amputation. Discussed the details of the CANVAS trial and minor increase in risk for patients associated with canagliflozin in geriatric age patients. Discussed his currently uncontrolled diabetes and risk of amputation irregardless if his diabetes remained uncontrolled. After discussing the risks and benefits of continuing canagliflozin, Mr.Decker's daughter agrees that he would benefit more from the additional diabetic meds. All other questions and concerns addressed.

## 2019-08-28 ENCOUNTER — Ambulatory Visit: Payer: Medicaid Other | Admitting: Podiatry

## 2019-09-07 ENCOUNTER — Encounter: Payer: Medicaid Other | Admitting: Internal Medicine

## 2019-09-16 MED FILL — INVOKANA 300 MG TABLET: 300 | 30 days supply | Qty: 30 | Fill #1

## 2019-09-16 MED FILL — LEVEMIR FLEXTOUCH 100 UNITS: 100 | 25 days supply | Qty: 15 | Fill #1

## 2019-09-16 MED FILL — ACCU-CHEK GUIDE TEST STRIP: 25 days supply | Qty: 100 | Fill #1

## 2019-09-16 MED FILL — UNIFINE PENTIPS 31GX3/16: 31G X 5 MM | 34 days supply | Qty: 100 | Fill #1

## 2019-09-18 ENCOUNTER — Other Ambulatory Visit: Payer: Self-pay | Admitting: *Deleted

## 2019-09-18 DIAGNOSIS — E1165 Type 2 diabetes mellitus with hyperglycemia: Secondary | ICD-10-CM

## 2019-09-18 MED ORDER — ACCU-CHEK FASTCLIX LANCETS MISC: 3 refills | Status: DC

## 2019-09-18 MED ORDER — ACCU-CHEK GUIDE VI STRP: ORAL_STRIP | 3 refills | Status: DC

## 2019-10-22 MED FILL — LEVEMIR FLEXTOUCH 100 UNITS: 100 | 25 days supply | Qty: 15 | Fill #2

## 2019-10-22 MED FILL — LOSARTAN-HCTZ 50-12.5 MG TA: 50-12.5 | 30 days supply | Qty: 30 | Fill #1

## 2019-10-22 MED FILL — INVOKANA 300 MG TABLET: 300 | 30 days supply | Qty: 30 | Fill #2

## 2019-10-22 MED FILL — UNIFINE PENTIPS 31GX3/16: 31G X 5 MM | 34 days supply | Qty: 100 | Fill #2

## 2019-11-23 MED FILL — INVOKANA 300 MG TABLET: 300 | 30 days supply | Qty: 30 | Fill #3

## 2019-11-23 MED FILL — LEVEMIR FLEXTOUCH 100 UNITS: 100 | 25 days supply | Qty: 15 | Fill #3

## 2019-11-23 MED FILL — LOSARTAN-HCTZ 50-12.5 MG TA: 50-12.5 | 30 days supply | Qty: 30 | Fill #2

## 2019-11-23 MED FILL — METFORMIN HCL 1000 MG TABS: 1000 | 90 days supply | Qty: 180 | Fill #1

## 2019-11-23 MED FILL — ATORVASTATIN 40 MG TABLET: 40 | 90 days supply | Qty: 90 | Fill #1

## 2019-12-21 MED FILL — ACCU-CHEK GUIDE STRP: 25 days supply | Qty: 100 | Fill #2

## 2019-12-21 MED FILL — LOSARTAN-HCTZ 50-12.5 MG TA: 50-12.5 | 30 days supply | Qty: 30 | Fill #3

## 2019-12-21 MED FILL — UNIFINE PENTIPS 31GX3/16: 31G X 5 MM | 34 days supply | Qty: 100 | Fill #3

## 2019-12-21 MED FILL — LEVEMIR FLEXTOUCH 100 UNITS: 100 | 25 days supply | Qty: 15 | Fill #4

## 2019-12-21 MED FILL — INVOKANA 300 MG TABLET: 300 | 30 days supply | Qty: 30 | Fill #4

## 2020-01-25 ENCOUNTER — Telehealth: Payer: Self-pay

## 2020-01-25 ENCOUNTER — Other Ambulatory Visit (HOSPITAL_COMMUNITY): Payer: Self-pay | Admitting: Student

## 2020-01-25 ENCOUNTER — Other Ambulatory Visit: Payer: Self-pay | Admitting: Internal Medicine

## 2020-01-25 DIAGNOSIS — I10 Essential (primary) hypertension: Secondary | ICD-10-CM

## 2020-01-25 MED FILL — LEVEMIR FLEXTOUCH 100 UNITS: 100 | 25 days supply | Qty: 15 | Fill #5

## 2020-01-25 MED FILL — LOSARTAN-HCTZ 50-12.5 MG TA: 50-12.5 | 30 days supply | Qty: 30 | Fill #0

## 2020-01-25 MED FILL — INVOKANA 300 MG TABLET: 300 | 30 days supply | Qty: 30 | Fill #5

## 2020-01-25 NOTE — Telephone Encounter (Signed)
Requesting diabetes med to be filled @  [facility] - [place], [place] - [address] [facility]. Phone:  [phone]  Fax:  [phone]

## 2020-01-25 NOTE — Telephone Encounter (Addendum)
Called pt - someone answered the phone; she was informed he has refills on his diabetes medication. She stated no refills on (she spelled losartan). Which has been refilled today per Dr Beryle Beams.

## 2020-02-03 ENCOUNTER — Encounter: Payer: Medicaid Other | Admitting: Internal Medicine

## 2020-02-03 ENCOUNTER — Telehealth: Payer: Self-pay | Admitting: *Deleted

## 2020-02-03 NOTE — Telephone Encounter (Signed)
CALLED PATIENT, LEFT MESSAGE WITH WIFE TO HAVE MR [name] TO CALL CLINIC ( [phone] ) TO RESCHEDULE TODAY'S MISSED APPOINTMENT.

## 2020-03-11 MED FILL — ATORVASTATIN 40 MG TABLET: 40 | 90 days supply | Qty: 90 | Fill #2

## 2020-03-11 MED FILL — ACCU-CHEK GUIDE STRP: 25 days supply | Qty: 100 | Fill #3

## 2020-03-11 MED FILL — LOSARTAN-HCTZ 50-12.5 MG TA: 50-12.5 | 30 days supply | Qty: 30 | Fill #1

## 2020-03-11 MED FILL — UNIFINE PENTIPS 31GX3/16: 31G X 5 MM | 34 days supply | Qty: 100 | Fill #4

## 2020-03-11 MED FILL — LEVEMIR FLEXTOUCH 100 UNITS: 100 | 25 days supply | Qty: 15 | Fill #6

## 2020-03-11 MED FILL — METFORMIN HCL 1000 MG TABS: 1000 | 90 days supply | Qty: 180 | Fill #2

## 2020-03-11 MED FILL — INVOKANA 300 MG TABLET: 300 | 30 days supply | Qty: 30 | Fill #6

## 2020-05-10 ENCOUNTER — Ambulatory Visit: Payer: Medicaid Other | Admitting: Podiatry

## 2020-06-13 ENCOUNTER — Other Ambulatory Visit (HOSPITAL_COMMUNITY): Payer: Self-pay

## 2020-06-13 MED FILL — Insulin Detemir Soln Pen-injector 100 Unit/ML: SUBCUTANEOUS | 25 days supply | Qty: 15 | Fill #0 | Status: AC

## 2020-06-13 MED FILL — Canagliflozin Tab 300 MG: ORAL | 90 days supply | Qty: 90 | Fill #0 | Status: AC

## 2020-06-13 MED FILL — Losartan Potassium & Hydrochlorothiazide Tab 50-12.5 MG: ORAL | 30 days supply | Qty: 30 | Fill #0 | Status: AC

## 2020-06-13 MED FILL — Metformin HCl Tab 1000 MG: ORAL | 90 days supply | Qty: 180 | Fill #0 | Status: AC

## 2020-06-13 MED FILL — Atorvastatin Calcium Tab 40 MG (Base Equivalent): ORAL | 90 days supply | Qty: 90 | Fill #0 | Status: AC

## 2020-06-18 ENCOUNTER — Encounter: Payer: Self-pay | Admitting: *Deleted

## 2020-06-28 ENCOUNTER — Ambulatory Visit: Payer: Medicaid Other | Admitting: Podiatry

## 2020-08-31 ENCOUNTER — Other Ambulatory Visit (HOSPITAL_COMMUNITY): Payer: Self-pay

## 2020-09-14 ENCOUNTER — Other Ambulatory Visit: Payer: Self-pay | Admitting: Internal Medicine

## 2020-09-14 ENCOUNTER — Other Ambulatory Visit (HOSPITAL_COMMUNITY): Payer: Self-pay

## 2020-09-14 MED FILL — Losartan Potassium & Hydrochlorothiazide Tab 50-12.5 MG: ORAL | 30 days supply | Qty: 30 | Fill #1 | Status: AC

## 2020-09-15 ENCOUNTER — Other Ambulatory Visit (HOSPITAL_COMMUNITY): Payer: Self-pay

## 2020-09-16 ENCOUNTER — Telehealth: Payer: Self-pay | Admitting: Internal Medicine

## 2020-09-16 ENCOUNTER — Encounter: Payer: Medicaid Other | Admitting: Student

## 2020-09-16 NOTE — Telephone Encounter (Signed)
Refill Request-   Pt is completely out and is sch for an appt Monday 09/19/2020 with Dr. Court Joy @ 8:45 am.  insulin detemir (LEVEMIR FLEXTOUCH) 100 UNIT/ML FlexPen insulin detemir (LEVEMIR) 100 UNIT/ML FlexPen (Expired) Insulin Pen Needle 31G X 5 MM MISC Insulin Syringe-Needle U-100 (ULTICARE INSULIN SYRINGE) 31G X 5/16" 0.5 ML MISC   metFORMIN (GLUCOPHAGE) 1000 MG tablet   [place], [place] - [address] [facility]. (Ph: [phone])

## 2020-09-19 ENCOUNTER — Ambulatory Visit (INDEPENDENT_AMBULATORY_CARE_PROVIDER_SITE_OTHER): Payer: Medicaid Other | Admitting: Internal Medicine

## 2020-09-19 ENCOUNTER — Encounter: Payer: Self-pay | Admitting: Internal Medicine

## 2020-09-19 ENCOUNTER — Other Ambulatory Visit: Payer: Self-pay

## 2020-09-19 ENCOUNTER — Other Ambulatory Visit (HOSPITAL_COMMUNITY): Payer: Self-pay

## 2020-09-19 VITALS — BP 133/90 | HR 95 | Temp 98.7°F | Resp 20 | Ht 76.0 in | Wt 192.9 lb

## 2020-09-19 DIAGNOSIS — I10 Essential (primary) hypertension: Secondary | ICD-10-CM

## 2020-09-19 DIAGNOSIS — Z794 Long term (current) use of insulin: Secondary | ICD-10-CM | POA: Diagnosis not present

## 2020-09-19 DIAGNOSIS — E785 Hyperlipidemia, unspecified: Secondary | ICD-10-CM | POA: Diagnosis not present

## 2020-09-19 DIAGNOSIS — Z89412 Acquired absence of left great toe: Secondary | ICD-10-CM

## 2020-09-19 DIAGNOSIS — E1169 Type 2 diabetes mellitus with other specified complication: Secondary | ICD-10-CM | POA: Diagnosis not present

## 2020-09-19 DIAGNOSIS — E1165 Type 2 diabetes mellitus with hyperglycemia: Secondary | ICD-10-CM

## 2020-09-19 LAB — GLUCOSE, CAPILLARY: Glucose-Capillary: 406 mg/dL — ABNORMAL HIGH (ref 70–99)

## 2020-09-19 LAB — POCT GLYCOSYLATED HEMOGLOBIN (HGB A1C): Hemoglobin A1C: 12 % — AB (ref 4.0–5.6)

## 2020-09-19 MED ORDER — RYBELSUS 3 MG PO TABS
3.0000 mg | ORAL_TABLET | Freq: Every day | ORAL | 0 refills | Status: DC
Start: 2020-09-19 — End: 2020-09-20
  Filled 2020-09-19: qty 30, 30d supply, fill #0

## 2020-09-19 MED ORDER — ACCU-CHEK SOFTCLIX LANCETS MISC
3 refills | Status: DC
  Filled 2020-09-19: qty 300, 75d supply, fill #0
  Filled 2020-09-19: qty 204, 51d supply, fill #0

## 2020-09-19 MED ORDER — ACCU-CHEK GUIDE VI STRP
ORAL_STRIP | 3 refills | Status: DC
  Filled 2020-09-19: qty 350, 87d supply, fill #0

## 2020-09-19 MED ORDER — LOSARTAN POTASSIUM-HCTZ 50-12.5 MG PO TABS
1.0000 | ORAL_TABLET | Freq: Every day | ORAL | 3 refills | Status: DC
  Filled 2020-09-19: qty 30, 30d supply, fill #0

## 2020-09-19 MED ORDER — INSULIN PEN NEEDLE 31G X 5 MM MISC
1.0000 [IU] | 11 refills | Status: DC
  Filled 2020-09-19: qty 100, 30d supply, fill #0

## 2020-09-19 MED ORDER — XIGDUO XR 5-1000 MG PO TB24
2.0000 | ORAL_TABLET | Freq: Every day | ORAL | 2 refills | Status: DC
  Filled 2020-09-19: qty 180, 90d supply, fill #0

## 2020-09-19 MED ORDER — ATORVASTATIN CALCIUM 40 MG PO TABS
40.0000 mg | ORAL_TABLET | Freq: Every day | ORAL | 3 refills | Status: DC
  Filled 2020-09-19: qty 90, 90d supply, fill #0
  Filled 2021-05-04: qty 90, 90d supply, fill #1
  Filled 2021-07-25: qty 90, 90d supply, fill #2

## 2020-09-19 MED ORDER — ACCU-CHEK GUIDE W/DEVICE KIT
1.0000 | PACK | Freq: Three times a day (TID) | 0 refills | Status: DC
  Filled 2020-09-19: qty 1, 1d supply, fill #0

## 2020-09-19 MED ORDER — INSULIN SYRINGE-NEEDLE U-100 31G X 5/16" 0.5 ML MISC
1.0000 | 5 refills | Status: DC | PRN
Start: 2020-09-19 — End: 2020-09-19
  Filled 2020-09-19: qty 100, 50d supply, fill #0

## 2020-09-19 MED ORDER — LEVEMIR FLEXTOUCH 100 UNIT/ML ~~LOC~~ SOPN
30.0000 [IU] | PEN_INJECTOR | Freq: Two times a day (BID) | SUBCUTANEOUS | 6 refills | Status: DC
  Filled 2020-09-19: qty 30, 50d supply, fill #0

## 2020-09-19 NOTE — Assessment & Plan Note (Signed)
Refilled atorvastatin 40 mg today.

## 2020-09-19 NOTE — Assessment & Plan Note (Addendum)
HPI: Patient's BP today is 133/90 with a goal of 125-130/80. The patient endorses adherence to his medication regimen.    Assessment/Plan: Patient close to goal BP today. No further titration of medication today.  - BMP8+Anion Gap - losartan-hydrochlorothiazide (HYZAAR) 50-12.5 MG tablet, one tablet daily. If stable at next visit can prescribe for 3 months at a time.

## 2020-09-19 NOTE — Patient Instructions (Addendum)
    1. Uncontrolled type 2 diabetes mellitus with hyperglycemia (HCC) Please return in 3 days with [facility] & Donna for CGM placement. - POC Hbg A1C - Referral to Nutrition and Diabetes Services - Accu-Chek FastClix Lancets MISC; Check blood sugar up to 4 times a day  Dispense: 306 each; Refill: 3 - Insulin Syringe-Needle U-100 (ULTICARE INSULIN SYRINGE) 31G X 5/16" 0.5 ML MISC; 1 Syringe by Does not apply route as needed. Use with insulin two times daily  Dispense: 100 each; Refill: 5 - glucose blood (ACCU-CHEK GUIDE) test strip; Check blood sugar 4 times per day  Dispense: 350 each; Refill: 3 - Insulin Pen Needle 31G X 5 MM MISC; 1 Units by Does not apply route as directed.  Dispense: 100 each; Refill: 11 - insulin detemir (LEVEMIR FLEXTOUCH) 100 UNIT/ML FlexPen; Inject 30 Units into the skin 2 (two) times daily.  Dispense: 30 mL; Refill: 6 - atorvastatin (LIPITOR) 40 MG tablet; Take 1 tablet (40 mg total) by mouth daily.  Dispense: 90 tablet; Refill: 3 - Amb Referral to Clinical Pharmacist - Blood Glucose Monitoring Suppl (CONTOUR NEXT MONITOR) w/Device KIT; 1 strip by Does not apply route 4 (four) times daily -  before meals and at bedtime. Use to check sugar 3 to 4x daily before meals & at bed  Dispense: 1 kit; Refill: 0 2. Essential hypertension - BMP8+Anion Gap - losartan-hydrochlorothiazide (HYZAAR) 50-12.5 MG tablet; Take 1 tablet by mouth daily.  Dispense: 30 tablet; Refill: 3      

## 2020-09-19 NOTE — Assessment & Plan Note (Addendum)
HPI: Patient had a left ray amputation of great toe at [facility] last month. Hgb A1c 11.8 % at the last visit. Current A1c 12%. Patient has lost his glucometer. Patient endorses taking medications daily. He did run out of Insulin for the last 5 days, glucose 406 today. The patient denied polyuria, polydipsia, headache, fatigue, confusion, nausea, vomiting or diaphoresis.    Assessment/Plan: Uncontrolled type 2 diabetes mellitus with hyperglycemia . Discussed importance of getting control of his T2DM and will refer him to nutrition and diabetes services for diet education and CGM ( will need to schedule to have CGM placed in 4 days). In addition he would benefit from meeting with clinical pharmacist to review medications and barriers to adherence. It is possible patient may just need additional titration of medications. Will add GLP-1 today. Combined SGLT-2 and Metformin today to simplify regimen for patient.   - POC Hbg A1C - Glucose, capillary - Dapagliflozin-metFORMIN HCl ER (XIGDUO XR) 05-998 MG TB24; Take 2 tablets by mouth daily.  Dispense: 180 tablet; Refill: 2 - Semaglutide (RYBELSUS) 3 MG TABS; Take 1 tablet (3 mg) by mouth daily.  Dispense: 30 tablet; Refill: 0 - Referral to Nutrition and Diabetes Services - Blood Glucose Monitoring Suppl (ACCU-CHEK GUIDE) w/Device KIT; Use 4 (four) times daily -  before meals and at bedtime.  Dispense: 1 kit; Refill: 0 - Accu-Chek Softclix Lancets lancets; use up to 4 times daily  Dispense: 300 each; Refill: 3 - glucose blood (ACCU-CHEK GUIDE) test strip; Check blood sugar 4 times per day  Dispense: 350 each; Refill: 3 - Insulin Pen Needle 31G X 5 MM MISC; Use as directed  Dispense: 100 each; Refill: 11 - insulin detemir (LEVEMIR FLEXTOUCH) 100 UNIT/ML FlexPen; Inject 30 Units into the skin 2 (two) times daily.  Dispense: 30 mL; Refill: 6 - atorvastatin (LIPITOR) 40 MG tablet; Take 1 tablet (40 mg total) by mouth daily.  Dispense: 90 tablet; Refill: 3 - Amb  Referral to Clinical Pharmacist  Addendum: Patient has insurance and GLP-1 and combination SGLT-2 + metformin not covered. Prescribed the following:   - dapagliflozin propanediol (FARXIGA) 10 MG TABS tablet; Take 1 tablet (10 mg total) by mouth daily before breakfast.  Dispense: 30 tablet; Refill: 2 - metFORMIN (GLUCOPHAGE) 1000 MG tablet; Take 1 tablet (1,000 mg total) by mouth 2 (two) times daily with a meal.  Dispense: 60 tablet; Refill: 11 - Dulaglutide (TRULICITY) 3.42 AJ/6.8TL SOPN; Inject 0.75 mg into the skin once a week.  Dispense: 2 mL; Refill: 0  Plan to increase dulaglutide in 4 weeks if patient is tolerating medication. These changes were communicated to son who speaks [place]. I discussed BMP , and concern for patient beginning to go into DKA possibly. I asked if patient had picked up Insulin yesterday. He is unsure, but said his father had not reported feeling ill. I gave patient son my advice to take patient to ED if he was nauseas, having stomach pain, sleepy or confused, and had elevated blood glucose.

## 2020-09-19 NOTE — Progress Notes (Signed)
   CC: Essential hypertension, uncontrolled type 2 diabetes mellitus, osteomyelitis of left foot  HPI:[name] is a 55 y.o. male who presents for evaluation of essential hypertension, uncontrolled type 2 diabetes mellitus, and osteomyelitis of left foot. Please see individual problem based A/P for details.   Past Medical History:  Diagnosis Date   Arthritis    left knee   Diabetes mellitus 2003   Hyperlipidemia    Review of Systems:   Review of Systems  Constitutional:  Negative for chills and fever.  Neurological:  Negative for dizziness.  Endo/Heme/Allergies:  Negative for polydipsia.       Negative for polyuria    Physical Exam: Vitals:   09/19/20 0842  BP: 133/90  Pulse: 95  Resp: 20  Temp: 98.7 F (37.1 C)  TempSrc: Oral  SpO2: 100%  Weight: 192 lb 14.4 oz (87.5 kg)  Height: '6\' 4"'$  (1.93 m)   General: middle age man sitting in a wheelchair, has left foot wrapped in [place] and wearing a surgical shoe HEENT: Conjunctiva nl , antiicteric sclerae, moist mucous membranes, no exudate or erythema Cardiovascular: Normal rate, regular rhythm.  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  Patient discussed with Dr. Dareen Piano

## 2020-09-19 NOTE — Assessment & Plan Note (Signed)
Amputation on 7/222022 at [facility]. Wound examined today, see picture in chart. Wound does not appear to be healing well, but no sign of infection. On IV Daptomycin until 9/18. Patient has follow up with surgeon tomorrow. He is also being followed in wound care clinic and is seeing a podiatrist.

## 2020-09-20 ENCOUNTER — Other Ambulatory Visit (HOSPITAL_COMMUNITY): Payer: Self-pay

## 2020-09-20 LAB — BMP8+ANION GAP
Anion Gap: 19 mmol/L — ABNORMAL HIGH (ref 10.0–18.0)
BUN/Creatinine Ratio: 29 — ABNORMAL HIGH (ref 9–20)
BUN: 37 mg/dL — ABNORMAL HIGH (ref 6–24)
CO2: 18 mmol/L — ABNORMAL LOW (ref 20–29)
Calcium: 9.3 mg/dL (ref 8.7–10.2)
Chloride: 98 mmol/L (ref 96–106)
Creatinine, Ser: 1.26 mg/dL (ref 0.76–1.27)
Glucose: 411 mg/dL — ABNORMAL HIGH (ref 65–99)
Potassium: 4.8 mmol/L (ref 3.5–5.2)
Sodium: 135 mmol/L (ref 134–144)
eGFR: 67 mL/min/{1.73_m2} (ref >59)

## 2020-09-20 MED ORDER — TRULICITY 0.75 MG/0.5ML ~~LOC~~ SOAJ
0.7500 mg | SUBCUTANEOUS | 0 refills | Status: DC
  Filled 2020-09-20: qty 2, 28d supply, fill #0

## 2020-09-20 MED ORDER — METFORMIN HCL 1000 MG PO TABS
1000.0000 mg | ORAL_TABLET | Freq: Two times a day (BID) | ORAL | 11 refills | Status: DC
  Filled 2020-09-20: qty 60, 30d supply, fill #0

## 2020-09-20 MED ORDER — DAPAGLIFLOZIN PROPANEDIOL 10 MG PO TABS
10.0000 mg | ORAL_TABLET | Freq: Every day | ORAL | 2 refills | Status: DC
  Filled 2020-09-20: qty 30, 30d supply, fill #0

## 2020-09-20 NOTE — Addendum Note (Signed)
Addended by: Lyndal Pulley on: 09/20/2020 02:00 PM   Modules accepted: Orders

## 2020-09-21 ENCOUNTER — Other Ambulatory Visit (HOSPITAL_COMMUNITY): Payer: Self-pay

## 2020-09-22 ENCOUNTER — Telehealth: Payer: Self-pay | Admitting: Internal Medicine

## 2020-09-22 DIAGNOSIS — E1165 Type 2 diabetes mellitus with hyperglycemia: Secondary | ICD-10-CM

## 2020-09-22 NOTE — Telephone Encounter (Signed)
Spoke to Mr. Foisy's son, who speaks [place].  He is going to call and have his father added to my schedule this afternoon to discuss his diabetic regimen.  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

## 2020-09-22 NOTE — Progress Notes (Addendum)
Internal Medicine Clinic Attending  Case discussed with Dr. Court Joy  At the time of the visit.  We reviewed the resident's history and exam and pertinent patient test results.  I agree with the assessment, diagnosis, and plan of care documented in the resident's note.  Patient likely in mild DKA given bicarb of 18 and anion gap of 19 and blood glucose of 411.  Resident did discuss results with patient's son.  Would bring patient back for repeat BMP, UA and serum beta hydroxybutyric acid today.  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  Discussed with Dr. Court Joy

## 2020-09-23 ENCOUNTER — Other Ambulatory Visit (HOSPITAL_COMMUNITY): Payer: Self-pay

## 2020-09-27 ENCOUNTER — Encounter: Payer: Medicaid Other | Admitting: Internal Medicine

## 2020-09-28 ENCOUNTER — Other Ambulatory Visit: Payer: Self-pay

## 2020-09-28 ENCOUNTER — Encounter: Payer: Self-pay | Admitting: Dietician

## 2020-09-28 ENCOUNTER — Ambulatory Visit (INDEPENDENT_AMBULATORY_CARE_PROVIDER_SITE_OTHER): Payer: Medicaid Other | Admitting: Dietician

## 2020-09-28 ENCOUNTER — Ambulatory Visit (INDEPENDENT_AMBULATORY_CARE_PROVIDER_SITE_OTHER): Payer: Medicaid Other | Admitting: Pharmacist

## 2020-09-28 DIAGNOSIS — E1165 Type 2 diabetes mellitus with hyperglycemia: Secondary | ICD-10-CM

## 2020-09-28 LAB — BASIC METABOLIC PANEL
Anion gap: 10 (ref 5–15)
BUN: 16 mg/dL (ref 6–20)
CO2: 23 mmol/L (ref 22–32)
Calcium: 9.2 mg/dL (ref 8.9–10.3)
Chloride: 101 mmol/L (ref 98–111)
Creatinine, Ser: 1 mg/dL (ref 0.61–1.24)
GFR, Estimated: >60 mL/min (ref >60)
Glucose, Bld: 217 mg/dL — ABNORMAL HIGH (ref 70–99)
Potassium: 4.5 mmol/L (ref 3.5–5.1)
Sodium: 134 mmol/L — ABNORMAL LOW (ref 135–145)

## 2020-09-28 NOTE — Progress Notes (Signed)
Subjective:    Patient ID: [name], male    DOB: January 05, 1966, 55 y.o.   MRN: [id number]  HPI Patient is a 55 y.o. male who presents for diabetes management. He is in good spirits and presents with assistance of wheelchair with his wife and son present. Patient's son translated. Patient was referred and last seen by provider, Dr. Court Joy, on 09/19/20. Patient saw Debera Lat, RD, today prior to appointment  Insurance coverage/medication affordability: Medicaid  Current diabetes medications include: insulin detemir (Levemir) 30 units BID, metformin '1000mg'$  BID, Trulicity 0.'75mg'$  once weekly on Mondays Current hypertension medications include: losartan-HCTZ 50-12.'5mg'$  (patient reports he did not pick up) Current hyperlipidemia medications include: atorvastatin '40mg'$ ; patient reports not picking up and currently interacts with antibiotic Patient states that He is taking his medications as prescribed. Patient denies adherence with medications. Patient states that He misses his medications 1-2 times per week, on average.  Do you feel that your medications are working for you?  yes  Have you been experiencing any side effects to the medications prescribed? no  Do you have any problems obtaining medications due to transportation or finances?  no    Patient reported dietary habits:  Patient saw Debera Lat, RD prior to appt. Please see note.  Patient denies hypoglycemic events. Patient denies polyuria (increased urination).  Patient denies polyphagia (increased appetite).  Patient denies polydipsia (increased thirst).  Patient reports neuropathy (nerve pain). Patient denies visual changes. Patient reports self foot exams.   Home fasting blood sugars: 200's   Objective:   Labs:   Physical Exam Neurological:     Mental Status: He is alert and oriented to person, place, and time.    Review of Systems  Gastrointestinal:  Negative for nausea and vomiting.   Lab Results  Component Value  Date   HGBA1C 12.0 (A) 09/19/2020   HGBA1C 11.8 (A) 08/17/2019   HGBA1C 12.3 (A) 04/28/2019    Lab Results  Component Value Date   MICRALBCREAT 13 04/28/2019    Lipid Panel     Component Value Date/Time   CHOL 215 (H) 04/28/2019 1436   TRIG 319 (H) 04/28/2019 1436   HDL 38 (L) 04/28/2019 1436   CHOLHDL 5.7 (H) 04/28/2019 1436   CHOLHDL 3.3 08/23/2014 0909   VLDL 28 08/23/2014 0909   LDLCALC 121 (H) 04/28/2019 1436    Clinical Atherosclerotic Cardiovascular Disease (ASCVD): No  The ASCVD Risk score Mikey Bussing DC Jr., et al., 2013) failed to calculate for the following reasons:   The valid total cholesterol range is 130 to 320 mg/dL    Assessment/Plan:   T2DM is not controlled likely due to patient being out of medications and having sub-optimal adherence. Patient is pending initiation of Farxiga on labs which were obtained today. Patient took first dose of Trulicity this Monday and will plan to titrate up when appropriate in 3 weeks. Following instruction patient verbalized understanding of treatment plan.    Continued basal insulin detemir (Levemir) 30 units BID Continued GLP-1 Trulicity 0.'75mg'$  once weekly on Mondays. Continued metformin '1000mg'$  BID Extensively discussed pathophysiology of diabetes, dietary effects on blood sugar control, and recommended lifestyle interventions. Counseled on s/sx of and management of hypoglycemia Next A1C anticipated November 2022.   ASCVD risk - primary prevention in patient with diabetes. Last LDL is not controlled.  Will hold atorvastatin while patient is on daptomycin antibiotic for infection due to drug-drug interaction. Will re-start when finish course of therapy  Follow-up appointment next with provider to review  sugar readings. Written patient instructions provided.  This appointment required 34 minutes of direct patient care.   Thank you for involving pharmacy to assist in providing this patient's care.

## 2020-09-28 NOTE — Assessment & Plan Note (Signed)
  T2DM is not controlled likely due to patient being out of medications and having sub-optimal adherence. Patient is pending initiation of Farxiga on labs which were obtained today. Patient took first dose of Trulicity this Monday and will plan to titrate up when appropriate in 3 weeks. Following instruction patient verbalized understanding of treatment plan.    insulin detemir (Levemir) 30 units BID, metformin '1000mg'$  BID, Trulicity 0.'75mg'$  once weekly on Mondays  1. Continued basal insulin detemir (Levemir) 30 units BID 2. Continued GLP-1 Trulicity 0.'75mg'$  once weekly on Mondays. 3. Continued metformin '1000mg'$  BID 4. Extensively discussed pathophysiology of diabetes, dietary effects on blood sugar control, and recommended lifestyle interventions. 5. Counseled on s/sx of and management of hypoglycemia 6. Next A1C anticipated November 2022.

## 2020-09-28 NOTE — Patient Instructions (Addendum)
Please make a follow up visit with [name] In 1 week.  TRy to eat a small snack between 6-7 to prevent the dizzy feelings  Ideas for snacks- hummus and 1/2 pita or small banana and peanut butter  If he is still hungry after one BALANCED plate of food, he can go back for more vegetables and protein.   [name] [phone]

## 2020-09-28 NOTE — Patient Instructions (Addendum)
????? ????? ?????.  ?????? ?????? ??? ???:  1. ????? ?? ????? ??????? ??????? ????? ????????? ????? ???? ?????. ??? ???? ???? ?? ????? ?? ??? ??? ????? ?? ????? ?? ?? ??? ???? ??? ??????? ? ????? ?? ?? ?????? ?????? ?????. 2. ????????? ?? ??? ??? ???? ?? ??????. ?? ????? ???? ?? ???? ???????? ???????? ?? ????? ?????? ?? ?????. 3. ????? ?? ????? ??????? ??? ?????? ???? ???????? ???? ????? ???????. ???? ?????? ??????? ????????? ???????? ????? ????? ?? ????? ???? ????? ?? ????. 4. ?? ???? Farxiga '10mg'$  ??? ???? ??? ???? ?? ????? ???????. ??? ?? ???? ???? ? ????? ???????? ?? ???? ????? ??????? ??????. 5. ???? ????????? ?? ???????? ????????????? 40 ??? ??? ????? ?? ???????? ???????. 6. ???????? ?? ?????? ??????? ?????? ???? ??? 19 ??????.   ??? ????? ?? ???? ?? ?????? ????? ?? ????:  ???? ?? ???? ?????? ??? ???? ????? ??? ???? ???? ????? ??? ?? ??? ????? ??? ?????.  ?? ??? ?? ??? ?? ????? ?? ???? ?? ???? ?? ??????? ? ??? ??? ???? ?? ???? ??? ????? ???? ?? ?? ???? ?? ????. ????? ? ??? ???? ????? ????????? ?? ????? ?????? ?????? ????? ???? ? ??? ???? ??? ??? ?????? ???? ????? ?? ????.  ???? ???????? ????????? ??????? ???? ????? ?? ???? ?? ???: 1. ?????? ????????? ?? ?????? 2. ?????? ?????? ?? ????? 3. ????? ?????? 4. ?????? ?????? ?? ???????? 5. ?????? ??? ????? ?? ?????  ???? ???? ??? ????? ?? ?????? ?? ???? ????? ?? ????? ???? ??????? 15 1. ???? ??? ???? ????? ??????. ??? ??? ??? ?? 70 ? ????? ??? ?????? 2. 2. ????? 15 ???? ?? ???????????? ????? ??????? ????? ???? ?? 3-4 ????? ??????. ?? ???? ??? ???? ?? ???? ? ????? ????? ?????? ?????? ?? ????? ????? ?? ????? ?? ????? ?? 4 ?????? ?? ???? ??????? ?? 6 ?????? ?? ?????? ???????. 3. ??? ??? ????? ?? 15 ?????. ??? ??? ?? ???? ??? ?? 70 ? ????? ?? ????? ?? ?????? 2 ??? ????. ??? ???? ???? ????? ?? ???? ???? ? ?????? ?? ????? ???? ????? ?? ???? ????? ????? ?? ???????????? ??????? (??? ?????? ???????) ????????? ?? ??? ????? ????? ????? ?????? ????  ????? ?? ????.   Mr. Behrns it was a pleasure seeing you today.   Please do the following:  Continue your current medications as directed today during your appointment. If you have any questions or if you believe something has occurred because of this change, call me or your doctor to let one of [place] know.  Continue checking blood sugars at home. It's really important that you record these and bring these in to your next doctor's appointment.  Continue making the lifestyle changes we've discussed together during our visit. Diet and exercise play a significant role in improving your blood sugars.  Do not take Farxiga '10mg'$  until you hear back about the labwork. If you do not hear back you can discuss at your doctor's appointment next week. Please continue to hold your atorvastatin '40mg'$  until you finish the antibiotics. Follow-up with doctor next week and me on September 19th.   Hypoglycemia or low blood sugar:   Low blood sugar can happen quickly and may become an emergency if not treated right away.   While this shouldn't happen often, it can be brought upon if you skip a meal or do not eat enough. Also, if your insulin or other diabetes medications are dosed too high, this can cause your blood sugar to  go to low.   Warning signs of low blood sugar include: Feeling shaky or dizzy Feeling weak or tired  Excessive hunger Feeling anxious or upset  Sweating even when you aren't exercising  What to do if I experience low blood sugar? Follow the Rule of 15 Check your blood sugar with your meter. If lower than 70, proceed to step 2.  Treat with 15 grams of fast acting carbs which is found in 3-4 glucose tablets. If none are available you can try hard candy, 1 tablespoon of sugar or honey,4 ounces of fruit juice, or 6 ounces of REGULAR soda.  Re-check your sugar in 15 minutes. If it is still below 70, do what you did in step 2 again. If your blood sugar has come back up, go ahead and eat a snack  or small meal made up of complex carbs (ex. Whole grains) and protein at this time to avoid recurrence of low blood sugar.

## 2020-09-28 NOTE — Progress Notes (Signed)
Diabetes Self-Management Education  Visit Type: First/Initial (used family(son & wife) for interpretation per patient request today)  Appt. Start Time: 825 Appt. End Time: 925  09/28/2020  [name], identified by name and date of birth, is a 55 y.o. male with a diagnosis of Diabetes: Type 2.   ASSESSMENT  Realized order for CGM placement at end of session and did not have time to place it. Patient will return next week for it to get placed.   CBC w/ diff, CK, [facility] nurse who comes by every Monday to draw blood  Has not taken [place] for 2 weeks, had first dose of Trulicity thispast MOnday Levemir 30 units and metfmorin % am & 5 PM  Her has had symptoms of low blood sugars since getting out of the hospital ~ 7-8 PM  Protein intake is marginally adequate for healing with patient's current intake and protein supplement.  Estimated protein needs for healing: ~ 140 grams per day  Patient reported protein intake estimated to be ~ 120-140 grams per day, with additional snack suggested today, it should be adequate  Lab Results  Component Value Date   HGBA1C 12.0 (A) 09/19/2020   HGBA1C 11.8 (A) 08/17/2019   HGBA1C 12.3 (A) 04/28/2019   HGBA1C 9.1 (A) 01/06/2019   HGBA1C 11.1 (A) 03/10/2018     Weight 208 lb 4.8 oz (94.5 kg). Body mass index is 25.36 kg/m.   Diabetes Self-Management Education - 09/28/20 1000       Visit Information   Visit Type First/Initial   used family(son & wife) for interpretation per patient request today     Initial Visit   Diabetes Type Type 2    Are you currently following a meal plan? No    Are you taking your medications as prescribed? Yes    Date Diagnosed ~ 2003      Psychosocial Assessment   Patient Belief/Attitude about Diabetes Motivated to manage diabetes    Self-care barriers English as a second language    Self-management support Doctor's office;Support group;Family    Other persons present Spouse/SO;Family Member   son  & wife   Patient Concerns Glycemic Control    Special Needs Simplified materials;Instruct caregiver   wife prepares meals and assists with food selection   Preferred Learning Style Auditory;Visual;Hands on    Learning Readiness Ready    How often do you need to have someone help you when you read instructions, pamphlets, or other written materials from your doctor or pharmacy? 3 - Sometimes   prefers written information in Arabic, son does not read Arabic   What is the last grade level you completed in school? 12   reads and speaks Arabic     Pre-Education Assessment   Patient understands the diabetes disease and treatment process. Demonstrates understanding / competency    Patient understands incorporating nutritional management into lifestyle. Needs Review    Patient undertands incorporating physical activity into lifestyle. Needs Review    Patient understands using medications safely. Needs Review    Patient understands monitoring blood glucose, interpreting and using results Needs Review   having trouble getting enough blood at times   Patient understands prevention, detection, and treatment of acute complications. Needs Review   high blood sugar   Patient understands prevention, detection, and treatment of chronic complications. Demonstrates understanding / competency    Patient understands how to develop strategies to address psychosocial issues. Demonstrates understanding / competency    Patient understands how to  develop strategies to promote health/change behavior. Needs Review      Complications   Last HgB A1C per patient/outside source 12 %    How often do you check your blood sugar? 1-2 times/day   brought a new meter today with no readings on it   Fasting Blood glucose range (mg/dL) >200;180-200    Postprandial Blood glucose range (mg/dL) >200    Number of hypoglycemic episodes per month 5   says they happen 7-8 PM, especially when he was working and sicne new insulin regimen    Can you tell when your blood sugar is low? Yes    What do you do if your blood sugar is low? drinks large glass of hibiscus tea with 2 packs splenda    Number of hyperglycemic episodes per week 7    Have you had a dilated eye exam in the past 12 months? No    Have you had a dental exam in the past 12 months? No    Are you checking your feet? Yes    How many days per week are you checking your feet? 7      Dietary Intake   Breakfast 10-11 AM whole whole wheat pita with 1 coffee mug milk with instant coffee 1 tsp in it    Snack (morning) 12 noon- 1 cup (20 grams protein) protein shake    Snack (afternoon) when working has monster energy drink ~ 5 Pm bfore work    Holiday representative 4-5 PM- protein shakes and largest meal with 1-2 cups meat- beef, salad( tomatoes, lettuc, onions, gr pepper with lime juice on it) ,pita and diet coke    Snack (evening) 930 PM froed chicken from cafeteria at work    [employer]) milk, coffee, diet coke, hibiscus tea, diet coke, tea      Exercise   Exercise Type ADL's;Light (walking / raking leaves)   when he works he is active, but he is not currently working and is asked to stay off his foot     Patient Education   Previous Diabetes Education Yes (please comment)   here last in 2018,  lost to follow up   Nutrition management  Role of diet in the treatment of diabetes and the relationship between the three main macronutrients and blood glucose level;Meal timing in regards to the patients' current diabetes medication.    Medications Taught/reviewed insulin injection, site rotation, insulin storage and needle disposal.;Reviewed patients medication for diabetes, action, purpose, timing of dose and side effects.    Monitoring Identified appropriate SMBG and/or A1C goals.    Acute complications Taught treatment of hypoglycemia - the 15 rule.    Personal strategies to promote health Helped patient develop diabetes management plan for (enter comment)   improving blood sugars      Individualized Goals (developed by patient)   Nutrition Other (comment)   have a snack between 5-7 PM   Medications Other (comment)   inject insulin into fatty areas shown today and rotate injection in that area   Monitoring  Other (comment)   massage and shake hand below hearl level before checking blood sugar     Outcomes   Expected Outcomes Demonstrated interest in learning. Expect positive outcomes    Future DMSE Other (comment)   1 week for cgm placement   Program Status Not Completed             Individualized Plan for Diabetes Self-Management Training:   Learning Objective:  Patient will have a  greater understanding of diabetes self-management. Patient education plan is to attend individual and/or group sessions per assessed needs and concerns.   Plan:   Patient Instructions  Please make a follow up visit with [name] In 2-3 weeks.  Ry to eat a small snack between 6-7 to prevent the dizzy feelings  Ideas for snacks- hummus and 1/2 pita or small banana  If he is still hungry after one BALANCED plate of food, he can go back for more vegetables and protein.   [name] [phone]   Expected Outcomes:  Demonstrated interest in learning. Expect positive outcomes  Education material provided: Diabetes Resources  If problems or questions, patient to contact team via:  Phone  Future DSME appointment: Other (comment) (1 week for cgm placement)  Debera Lat, RD 09/28/2020 10:59 AM.

## 2020-10-05 ENCOUNTER — Encounter: Payer: Medicaid Other | Admitting: Internal Medicine

## 2020-10-05 ENCOUNTER — Ambulatory Visit: Payer: Medicaid Other | Admitting: Dietician

## 2020-10-06 ENCOUNTER — Other Ambulatory Visit: Payer: Self-pay

## 2020-10-06 ENCOUNTER — Other Ambulatory Visit (HOSPITAL_COMMUNITY): Payer: Self-pay

## 2020-10-06 ENCOUNTER — Ambulatory Visit: Payer: Medicaid Other | Admitting: Dietician

## 2020-10-06 ENCOUNTER — Ambulatory Visit: Payer: Medicaid Other | Admitting: Internal Medicine

## 2020-10-06 ENCOUNTER — Encounter: Payer: Self-pay | Admitting: Internal Medicine

## 2020-10-06 DIAGNOSIS — E1165 Type 2 diabetes mellitus with hyperglycemia: Secondary | ICD-10-CM | POA: Diagnosis present

## 2020-10-06 LAB — BASIC METABOLIC PANEL
Anion gap: 8 (ref 5–15)
BUN: 29 mg/dL — ABNORMAL HIGH (ref 6–20)
CO2: 23 mmol/L (ref 22–32)
Calcium: 8.6 mg/dL — ABNORMAL LOW (ref 8.9–10.3)
Chloride: 107 mmol/L (ref 98–111)
Creatinine, Ser: 1.37 mg/dL — ABNORMAL HIGH (ref 0.61–1.24)
GFR, Estimated: >60 mL/min (ref >60)
Glucose, Bld: 121 mg/dL — ABNORMAL HIGH (ref 70–99)
Potassium: 3.9 mmol/L (ref 3.5–5.1)
Sodium: 138 mmol/L (ref 135–145)

## 2020-10-06 LAB — BETA-HYDROXYBUTYRIC ACID: Beta-Hydroxybutyric Acid: 0.12 mmol/L (ref 0.05–0.27)

## 2020-10-06 MED ORDER — TRULICITY 1.5 MG/0.5ML ~~LOC~~ SOAJ
1.5000 mg | SUBCUTANEOUS | 0 refills | Status: AC
  Filled 2020-10-06 – 2020-10-18 (×2): qty 2, 28d supply, fill #0

## 2020-10-06 MED ORDER — TRULICITY 1.5 MG/0.5ML ~~LOC~~ SOAJ
1.5000 mg | SUBCUTANEOUS | 0 refills | Status: DC
  Filled 2020-10-06: qty 2, 28d supply, fill #0

## 2020-10-06 MED ORDER — INSULIN PEN NEEDLE 31G X 5 MM MISC
1.0000 [IU] | 11 refills | Status: DC
  Filled 2020-10-06: qty 100, 30d supply, fill #0

## 2020-10-06 MED ORDER — ACCU-CHEK GUIDE VI STRP
ORAL_STRIP | 3 refills | Status: DC
  Filled 2020-10-06: qty 350, fill #0
  Filled 2021-01-11: qty 350, 88d supply, fill #0

## 2020-10-06 MED ORDER — ACCU-CHEK SOFTCLIX LANCETS MISC
3 refills | Status: DC
  Filled 2020-10-06: qty 300, fill #0

## 2020-10-06 MED ORDER — LEVEMIR FLEXTOUCH 100 UNIT/ML ~~LOC~~ SOPN
25.0000 [IU] | PEN_INJECTOR | Freq: Two times a day (BID) | SUBCUTANEOUS | 1 refills | Status: DC
  Filled 2020-10-06: qty 15, 30d supply, fill #0

## 2020-10-06 MED ORDER — ACCU-CHEK GUIDE W/DEVICE KIT
1.0000 | PACK | Freq: Three times a day (TID) | 0 refills | Status: DC
  Filled 2020-10-06: qty 1, 1d supply, fill #0

## 2020-10-06 MED ORDER — ACCU-CHEK SOFTCLIX LANCETS MISC
3 refills | Status: DC
Start: 2020-10-06 — End: 2022-02-08
  Filled 2020-10-06: qty 300, fill #0

## 2020-10-06 NOTE — Progress Notes (Signed)
   CC: diabetes follow up   HPI:  [name] is a 55 y.o. 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 He is presenting to the [facility] for a 2 week follow up of his diabetes. Please see problem-based list for further details, assessments, and plans.   Past Medical History:  Diagnosis Date   Arthritis    left knee   Diabetes mellitus 2003   Hyperlipidemia    Review of Systems:  Review of Systems  Constitutional:  Negative for chills and fever.  HENT: Negative.    Eyes:  Negative for blurred vision and double vision.  Respiratory:  Negative for shortness of breath and wheezing.   Cardiovascular:  Negative for chest pain and palpitations.  Gastrointestinal:  Negative for abdominal pain, diarrhea, nausea and vomiting.  Genitourinary:  Positive for frequency. Negative for dysuria and hematuria.  Musculoskeletal: Negative.   Neurological: Negative.     Physical Exam:  Vitals:   10/06/20 0849  BP: 135/78  Pulse: 79  Resp: 20  Temp: 98.3 F (36.8 C)  TempSrc: Oral  SpO2: 100%  Weight: 207 lb 1.6 oz (93.9 kg)  Height: '6\' 4"'$  (1.93 m)   General: No acute distress. Head: Normocephalic. Atraumatic. CV: RRR. No murmurs, rubs, or gallops. No LE edema Pulmonary: Lungs CTAB. Normal effort. No wheezing or rales. Abdominal: Soft, nontender, nondistended. Normal bowel sounds. 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 No obvious rash or lesions. Neuro: A&Ox3. Moves all extremities. Psych: Normal mood and affect   Assessment & Plan:   See Encounters Tab for problem based charting.  Patient seen with Dr. Daryll Drown

## 2020-10-06 NOTE — Patient Instructions (Addendum)
Thank you, [name] for allowing [place] to provide your care today. Today we discussed: Diabetes: We are DECREASING your insulin levemir to 25 units twice a day. We are INCREASING your Trulicity to 1.'5mg'$  injection once a week. Please follow up in 1 month for diabetes follow up and medicine adjustment.   We are also checking some lab work today and will call you if any results are abnormal.   I have ordered the following labs for you:   Lab Orders         BMP8+Anion Gap         Urinalysis, Reflex Microscopic         Beta-hydroxybutyric acid       Referrals ordered today:   Referral Orders  No referral(s) requested today     I have ordered the following medication/changed the following medications:   Stop the following medications: Medications Discontinued During This Encounter  Medication Reason   Dulaglutide (TRULICITY) A999333 0000000 SOPN Dose change   insulin detemir (LEVEMIR FLEXTOUCH) 100 UNIT/ML FlexPen Dose change     Start the following medications: Meds ordered this encounter  Medications   Dulaglutide (TRULICITY) 1.5 0000000 SOPN    Sig: Inject 1.5 mg into the skin once a week for 4 doses.    Dispense:  2 mL    Refill:  0   insulin detemir (LEVEMIR FLEXTOUCH) 100 UNIT/ML FlexPen    Sig: Inject 25 Units into the skin 2 (two) times daily.    Dispense:  15 mL    Refill:  1     Follow up:  1 month     Remember: To pick up your new insulin and trulicity at the pharmacy   Should you have any questions or concerns please call the internal medicine clinic at [phone].     Buddy Duty, D.O. [place]

## 2020-10-06 NOTE — Assessment & Plan Note (Signed)
Patient presented today for 2 week follow up of his diabetes. At his last office visit with Dr. Court Joy, A1c was 12 and BMP revealed an anion gap of 19 and the patient was likely in a mild DKA at the time. Patient has been taking insulin levemir 30 units BID, trulicity 0.'75mg'$  weekly, and metformin 1g BID. Farxiga '10mg'$  was discontinued at the last visit due to concern for DKA. Today the patient reports that he has been adherent with his medications and has no issues with them. He does note that when his blood sugars get less than 200, he gets lightheaded and dizzy quite frequently, but when his sugars are above 200, he feels fine. He also endorses an increase in urinary frequency, but denies any dysuria or hematuria.   Plan: - Decrease insulin levemir to 25u BID - Increase trulicity to 1.'5mg'$  weekly injection - Continue metformin 1g BID - Patient to meet with [name] today - Repeat BMP, UA, and beta hydroxybutyrate - Follow up in 2 weeks

## 2020-10-06 NOTE — Progress Notes (Signed)
Documentation for Freestyle Libre Pro Continuous glucose monitoring Freestyle Libre Pro CGM sensor placed today. Patient was educated about wearing sensor, keeping food, activity and medication log and when to call office. Patient was educated about how to care for the sensor and not to have an MRI, CT or Diathermy while wearing the sensor. Follow up was arranged with the patient for 2 weeks.   Lot #: [id number] Serial #: [id number] Expiration Date: 01/28/21  Debera Lat, RD 10/06/2020 10:00 AM.

## 2020-10-06 NOTE — Patient Instructions (Signed)
Please record the time, amount and what food drinks and activities you have while wearing the continuous glucose monitor (CGM).  Bring the folder with you to follow up appointments. If your monitor falls off, please place it in the bag provided in your folder and bring it back with you to your next appointment.   Do not have a CT or an MRI while wearing the CGM.    You will return in 2 weeks to have your second download and the CGM removed. [name] [phone]

## 2020-10-06 NOTE — Addendum Note (Signed)
Addended by: Virl Axe on: 10/06/2020 09:50 AM   Modules accepted: Orders

## 2020-10-06 NOTE — Addendum Note (Signed)
Addended by: Buddy Duty on: 10/06/2020 09:48 AM   Modules accepted: Orders

## 2020-10-07 LAB — URINALYSIS, ROUTINE W REFLEX MICROSCOPIC
Bilirubin, UA: NEGATIVE
Glucose, UA: NEGATIVE
Ketones, UA: NEGATIVE
Leukocytes,UA: NEGATIVE
Nitrite, UA: NEGATIVE
Protein,UA: NEGATIVE
RBC, UA: NEGATIVE
Specific Gravity, UA: 1.014 (ref 1.005–1.030)
Urobilinogen, Ur: 0.2 mg/dL (ref 0.2–1.0)
pH, UA: 5 (ref 5.0–7.5)

## 2020-10-11 NOTE — Progress Notes (Signed)
Internal Medicine Clinic Attending ° °I saw and evaluated the patient.  I personally confirmed the key portions of the history and exam documented by Dr. Atway and I reviewed pertinent patient test results.  The assessment, diagnosis, and plan were formulated together and I agree with the documentation in the resident’s note.  °

## 2020-10-14 ENCOUNTER — Other Ambulatory Visit (HOSPITAL_COMMUNITY): Payer: Self-pay

## 2020-10-17 ENCOUNTER — Ambulatory Visit (INDEPENDENT_AMBULATORY_CARE_PROVIDER_SITE_OTHER): Payer: Medicaid Other | Admitting: Pharmacist

## 2020-10-17 DIAGNOSIS — E1165 Type 2 diabetes mellitus with hyperglycemia: Secondary | ICD-10-CM

## 2020-10-17 NOTE — Patient Instructions (Addendum)
[name] it was a pleasure seeing you today.   Please do the following:  Increase your Levemir insulin to 30 units twice a day as directed today during your appointment. If you have any questions or if you believe something has occurred because of this change, call me or your doctor to let one of [place] know.  Pick up your new prescription of Trulicity 1.'5mg'$  at the pharmacy and start next Monday Continue checking blood sugars at home. It's really important that you record these and bring these in to your next doctor's appointment.  Continue making the lifestyle changes we've discussed together during our visit. Diet and exercise play a significant role in improving your blood sugars.  Follow-up with me in Monday October 17th 8:30 AM .    Hypoglycemia or low blood sugar:   Low blood sugar can happen quickly and may become an emergency if not treated right away.   While this shouldn't happen often, it can be brought upon if you skip a meal or do not eat enough. Also, if your insulin or other diabetes medications are dosed too high, this can cause your blood sugar to go to low.   Warning signs of low blood sugar include: Feeling shaky or dizzy Feeling weak or tired  Excessive hunger Feeling anxious or upset  Sweating even when you aren't exercising  What to do if I experience low blood sugar? Follow the Rule of 15 Check your blood sugar with your meter. If lower than 70, proceed to step 2.  Treat with 15 grams of fast acting carbs which is found in 3-4 glucose tablets. If none are available you can try hard candy, 1 tablespoon of sugar or honey,4 ounces of fruit juice, or 6 ounces of REGULAR soda.  Re-check your sugar in 15 minutes. If it is still below 70, do what you did in step 2 again. If your blood sugar has come back up, go ahead and eat a snack or small meal made up of complex carbs (ex. Whole grains) and protein at this time to avoid recurrence of low blood sugar.

## 2020-10-17 NOTE — Progress Notes (Signed)
Subjective:    Patient ID: [name], male    DOB: February 21, 1965, 55 y.o.   MRN: [id number]  HPI Patient is a 55 y.o. male who presents for diabetes management. He is in good spirits and presents with assistance of wheelchair with his wife. [place] interpreter [phone]. Patient was referred and last seen by provider, Dr. Raymondo Band, on 10/06/20. Last seen in [facility] clinic on 09/28/20.  Patient reports his blood glucose has been up and down  Insurance coverage/medication affordability: Medicaid  Current diabetes medications include: insulin detemir (Levemir) 25 units BID, metformin 1000mg  BID, Trulicity 0.75mg  once weekly on Mondays (has not increased to 1.5mg ; still finishing last pen of 0.75mg  today) Current hypertension medications include: losartan-HCTZ 50-12.5mg  (patient reports he did not pick up) Current hyperlipidemia medications include: atorvastatin 40mg ; patient reports not picking up and currently interacts with antibiotic therefore continue to hold Patient states that He is taking his medications as prescribed. Patient denies adherence with medications. Patient states that He misses his medications 1-2 times per week, on average.  Do you feel that your medications are working for you?  yes  Have you been experiencing any side effects to the medications prescribed? no  Do you have any problems obtaining medications due to transportation or finances?  no    Patient reported dietary habits:  Patient seeing Debera Lat, RD  Patient eating 2 meals in morning and evening Morning: cup of milk, whole wheat bread, sometimes beans Morning snack: protein shake Evening: meats and salad Evening snack:: Protein shake Snacks throughout day: fruits Drinks: water and milk  Patient denies hypoglycemic events. Patient denies polyuria (increased urination).  Patient denies polyphagia (increased appetite).  Patient denies polydipsia (increased thirst).  Patient reports neuropathy (nerve  pain). Patient denies visual changes. Patient reports self foot exams.   Objective:     Labs:   Physical Exam Neurological:     Mental Status: He is alert and oriented to person, place, and time.    Review of Systems  Gastrointestinal:  Negative for nausea and vomiting.   Lab Results  Component Value Date   HGBA1C 12.0 (A) 09/19/2020   HGBA1C 11.8 (A) 08/17/2019   HGBA1C 12.3 (A) 04/28/2019    Lab Results  Component Value Date   MICRALBCREAT 13 04/28/2019    Lipid Panel     Component Value Date/Time   CHOL 215 (H) 04/28/2019 1436   TRIG 319 (H) 04/28/2019 1436   HDL 38 (L) 04/28/2019 1436   CHOLHDL 5.7 (H) 04/28/2019 1436   CHOLHDL 3.3 08/23/2014 0909   VLDL 28 08/23/2014 0909   LDLCALC 121 (H) 04/28/2019 1436    Clinical Atherosclerotic Cardiovascular Disease (ASCVD): No  The ASCVD Risk score (Arnett DK, et al., 2019) failed to calculate for the following reasons:   The valid total cholesterol range is 130 to 320 mg/dL    Assessment/Plan:   T2DM is not controlled likely due sub-optimal lifestyle and previous issues surrounding adherence. Patient is pending initiation of Farxiga on labs which were obtained today. Patient has not titrated up Trulicity to 1.5mg  therefore will have patient titrate. Following instruction patient verbalized understanding of treatment plan.    Increased basal insulin detemir (Levemir) to 30 units BID Increased dose of GLP-1 Trulicity 1.5 mg once weekly on Mondays. Continued metformin 1000mg  BID Extensively discussed pathophysiology of diabetes, dietary effects on blood sugar control, and recommended lifestyle interventions. Counseled on s/sx of and management of hypoglycemia Next A1C anticipated November 2022.   ASCVD risk -  primary prevention in patient with diabetes. Last LDL is not controlled.  Will hold atorvastatin while patient is on daptomycin antibiotic for infection due to drug-drug interaction. Will re-start when finish  course of therapy. Patient is taking last dose of antibiotic today. Will discuss at follow-up visit.  Follow-up appointment next with provider to review sugar readings. Written patient instructions provided.  This appointment required 30 minutes of direct patient care.   Thank you for involving pharmacy to assist in providing this patient's care.

## 2020-10-18 ENCOUNTER — Other Ambulatory Visit (HOSPITAL_COMMUNITY): Payer: Self-pay

## 2020-10-19 ENCOUNTER — Telehealth: Payer: Self-pay | Admitting: *Deleted

## 2020-10-19 ENCOUNTER — Encounter: Payer: Self-pay | Admitting: Pharmacist

## 2020-10-19 NOTE — Telephone Encounter (Signed)
Call from pt's son - stated pt has finished taking abx's and needs the line taken out. I asked if pt has PAC or PICC - stated pt has a PICC line. The son was the one giving the pt his abx. I asked about HHN - stated  a nurse comes once a week. I asked pt's son to call the [facility] to see if she can take out the PICC line and if an order is needed to call [facility]. Stated he will.

## 2020-10-19 NOTE — Telephone Encounter (Signed)
Agree 

## 2020-10-19 NOTE — Assessment & Plan Note (Signed)
T2DM is not controlled likely due sub-optimal lifestyle and previous issues surrounding adherence. Patient is pending initiation of Farxiga on labs which were obtained today. Patient has not titrated up Trulicity to 1.5mg  therefore will have patient titrate. Following instruction patient verbalized understanding of treatment plan.    1. Increased basal insulin detemir (Levemir) to 30 units BID 2. Increased dose of GLP-1 Trulicity 1.5 mg once weekly on Mondays. 3. Continued metformin 1000mg  BID 4. Extensively discussed pathophysiology of diabetes, dietary effects on blood sugar control, and recommended lifestyle interventions. 5. Counseled on s/sx of and management of hypoglycemia 6. Next A1C anticipated November 2022.

## 2020-10-20 ENCOUNTER — Encounter: Payer: Medicaid Other | Admitting: Internal Medicine

## 2020-10-27 ENCOUNTER — Other Ambulatory Visit: Payer: Self-pay

## 2020-10-27 ENCOUNTER — Telehealth: Payer: Self-pay | Admitting: Dietician

## 2020-10-27 ENCOUNTER — Ambulatory Visit (INDEPENDENT_AMBULATORY_CARE_PROVIDER_SITE_OTHER): Payer: Medicaid Other | Admitting: Dietician

## 2020-10-27 ENCOUNTER — Encounter: Payer: Self-pay | Admitting: Dietician

## 2020-10-27 DIAGNOSIS — E1165 Type 2 diabetes mellitus with hyperglycemia: Secondary | ICD-10-CM | POA: Diagnosis not present

## 2020-10-27 NOTE — Patient Instructions (Signed)
New- see about times of taking insulin and try to walk after eating.  You can also try eating veggies and protein like meat first, eating the fruit and starch last  Please make appointments with the doctor and diabetes educator in 4 weeks.  Please bring his blood sugar meter and medicines  [name] [phone]

## 2020-10-27 NOTE — Progress Notes (Signed)
Diabetes Self-Management Education  Visit Type: Follow-up (1st fu in series)  Appt. Start Time: 115 Appt. End Time: 145  10/27/2020  [name], identified by name and date of birth, is a 55 y.o. male with a diagnosis of Diabetes:  .   ASSESSMENT Son here with patient and translated. He is trying to assist his father and shows interest in the CGM report.   Weight is ideal for height. Do not recommend any further weight loss. Estimated body mass index is 25.21 kg/m as calculated from the following:   Height as of 10/06/20: 6\' 4"  (1.93 m).   Weight as of 10/06/20: 207 lb 1.6 oz (93.9 kg). Wt Readings from Last 10 Encounters:  10/06/20 207 lb 1.6 oz (93.9 kg)  09/28/20 208 lb 4.8 oz (94.5 kg)  09/19/20 192 lb 14.4 oz (87.5 kg)  08/17/19 213 lb 8 oz (96.8 kg)  04/28/19 212 lb 4.8 oz (96.3 kg)  01/20/19 200 lb 9.6 oz (91 kg)  01/13/19 199 lb (90.3 kg)  01/06/19 199 lb 4.8 oz (90.4 kg)  10/13/18 205 lb 8 oz (93.2 kg)  09/01/18 199 lb 3.2 oz (90.4 kg)   Lab Results  Component Value Date   HGBA1C 12.0 (A) 09/19/2020   HGBA1C 11.8 (A) 08/17/2019   HGBA1C 12.3 (A) 04/28/2019   HGBA1C 9.1 (A) 01/06/2019   HGBA1C 11.1 (A) 03/10/2018     CGM Results from download:      Average glucose:   224 mg/dL for 15 days  Glucose management indicator:   8.7 %  Time in range (70-180 mg/dL):   30 %   (Goal >70%)  Time High (181-250 mg/dL):   32 %   (Goal < 25%)  Time Very High (>250 mg/dL):    38 %   (Goal < 5%)  Time Low (54-69 mg/dL):   0 %   (Goal <4%)  Time Very Low (<54 mg/dL):   0 %   (Goal <1%)  Coefficient of variation:   33.9 %   (Goal <36%)   Pattern is in target 10 am to 3 PM at which time son says he is busy doing chores, then eats in Pm and lays down. This is when his blood sugar rises and continues to rise until ~ 4am when it begins to dip back toward target. He is u[p late per son as he usually works at night and is a " night owl"   Diabetes Self-Management Education -  10/27/20 1500       Visit Information   Visit Type Follow-up   1st fu in series     Health Coping   How would you rate your overall health? Good      Patient Education   Nutrition management  Role of diet in the treatment of diabetes and the relationship between the three main macronutrients and blood glucose level    Physical activity and exercise  Role of exercise on diabetes management, blood pressure control and cardiac health.;Identified with patient nutritional and/or medication changes necessary with exercise.    Monitoring Other (comment)   went over cgm report explaiing how to interpret and use the information to improve diabetes self care     Subsequent Visit   Since your last visit have you continued or begun to take your medications as prescribed? Yes    Since your last visit have you had your blood pressure checked? Yes    Is your most recent blood pressure lower, unchanged, or  higher since your last visit? Lower    Since your last visit have you experienced any weight changes? Loss    Weight Loss (lbs) 1    Since your last visit, are you checking your blood glucose at least once a day? Yes             Individualized Plan for Diabetes Self-Management Training:   Learning Objective:  Patient will have a greater understanding of diabetes self-management. Patient education plan is to attend individual and/or group sessions per assessed needs and concerns.   Plan:   Patient Instructions  New- see about times of taking insulin and try to walk after eating.  You can also try eating veggies and protein like meat first, eating the fruit and starch last  Please make appointments with the doctor and diabetes educator in 4 weeks.  Please bring his blood sugar meter and medicines  [name] [phone]   Expected Outcomes:     Education material provided: Diabetes Resources  If problems or questions, patient to contact team via:  Phone  Future DSME appointment:   4 weeks Debera Lat, RD 10/27/2020 3:31 PM.

## 2020-10-27 NOTE — Telephone Encounter (Signed)
Son called for an appointment to remove his CGM sensor. Appointment scheduled for today

## 2020-11-14 ENCOUNTER — Encounter: Payer: Medicaid Other | Admitting: Pharmacist

## 2020-11-24 ENCOUNTER — Telehealth: Payer: Self-pay | Admitting: *Deleted

## 2020-11-24 NOTE — Telephone Encounter (Signed)
Tanya with [facility] called in stating they received a referral for [facility] RN/PT from [facility] that was cancelled as patient was going to SNF. That fell through so she is asking if we can ok referral and sign orders going forward. This will need to be under Attending as PCP is not yet certified with [facility]. She requested Dr. Heber Tajique.  Verbal auth given. Will route to Dr. Heber [place]se[place] for agreement/denial.

## 2020-11-25 NOTE — Telephone Encounter (Signed)
Agree 

## 2020-11-28 ENCOUNTER — Other Ambulatory Visit (HOSPITAL_COMMUNITY): Payer: Self-pay

## 2020-11-28 MED ORDER — GABAPENTIN 100 MG PO CAPS
100.0000 mg | ORAL_CAPSULE | Freq: Three times a day (TID) | ORAL | 2 refills | Status: DC
  Filled 2020-11-28: qty 60, 20d supply, fill #0

## 2020-11-30 ENCOUNTER — Encounter: Payer: Self-pay | Admitting: Internal Medicine

## 2020-11-30 ENCOUNTER — Encounter: Payer: Medicaid Other | Admitting: Dietician

## 2020-11-30 ENCOUNTER — Other Ambulatory Visit: Payer: Self-pay | Admitting: *Deleted

## 2020-11-30 ENCOUNTER — Ambulatory Visit (INDEPENDENT_AMBULATORY_CARE_PROVIDER_SITE_OTHER): Payer: Medicaid Other | Admitting: Internal Medicine

## 2020-11-30 ENCOUNTER — Other Ambulatory Visit: Payer: Self-pay

## 2020-11-30 ENCOUNTER — Encounter: Payer: Self-pay | Admitting: Dietician

## 2020-11-30 ENCOUNTER — Ambulatory Visit (INDEPENDENT_AMBULATORY_CARE_PROVIDER_SITE_OTHER): Payer: Medicaid Other | Admitting: Dietician

## 2020-11-30 ENCOUNTER — Other Ambulatory Visit (HOSPITAL_COMMUNITY): Payer: Self-pay

## 2020-11-30 VITALS — BP 147/84 | HR 92 | Temp 98.2°F | Ht 72.0 in | Wt 209.1 lb

## 2020-11-30 DIAGNOSIS — I10 Essential (primary) hypertension: Secondary | ICD-10-CM | POA: Diagnosis not present

## 2020-11-30 DIAGNOSIS — Z89412 Acquired absence of left great toe: Secondary | ICD-10-CM | POA: Diagnosis not present

## 2020-11-30 DIAGNOSIS — E785 Hyperlipidemia, unspecified: Secondary | ICD-10-CM | POA: Diagnosis not present

## 2020-11-30 DIAGNOSIS — E1165 Type 2 diabetes mellitus with hyperglycemia: Secondary | ICD-10-CM | POA: Diagnosis not present

## 2020-11-30 DIAGNOSIS — E1140 Type 2 diabetes mellitus with diabetic neuropathy, unspecified: Secondary | ICD-10-CM

## 2020-11-30 DIAGNOSIS — Z794 Long term (current) use of insulin: Secondary | ICD-10-CM | POA: Diagnosis not present

## 2020-11-30 DIAGNOSIS — Z0000 Encounter for general adult medical examination without abnormal findings: Secondary | ICD-10-CM

## 2020-11-30 DIAGNOSIS — Z23 Encounter for immunization: Secondary | ICD-10-CM

## 2020-11-30 DIAGNOSIS — E1169 Type 2 diabetes mellitus with other specified complication: Secondary | ICD-10-CM

## 2020-11-30 MED ORDER — LOSARTAN POTASSIUM-HCTZ 50-12.5 MG PO TABS
1.0000 | ORAL_TABLET | Freq: Every day | ORAL | 3 refills | Status: DC
  Filled 2020-11-30: qty 90, 90d supply, fill #0

## 2020-11-30 MED ORDER — TRULICITY 1.5 MG/0.5ML ~~LOC~~ SOAJ
1.5000 mg | SUBCUTANEOUS | 3 refills | Status: DC
  Filled 2020-11-30: qty 6, 84d supply, fill #0
  Filled 2021-05-04: qty 6, 84d supply, fill #1

## 2020-11-30 MED ORDER — TRULICITY 1.5 MG/0.5ML ~~LOC~~ SOAJ
1.5000 mg | SUBCUTANEOUS | 3 refills | Status: DC
  Filled 2020-11-30: qty 6, 84d supply, fill #0

## 2020-11-30 MED ORDER — LEVEMIR FLEXTOUCH 100 UNIT/ML ~~LOC~~ SOPN
25.0000 [IU] | PEN_INJECTOR | Freq: Two times a day (BID) | SUBCUTANEOUS | 3 refills | Status: DC
  Filled 2020-11-30: qty 45, 90d supply, fill #0

## 2020-11-30 MED ORDER — LEVEMIR FLEXTOUCH 100 UNIT/ML ~~LOC~~ SOPN
25.0000 [IU] | PEN_INJECTOR | Freq: Two times a day (BID) | SUBCUTANEOUS | 3 refills | Status: DC
  Filled 2020-11-30: qty 45, 90d supply, fill #0
  Filled 2021-05-04: qty 45, 90d supply, fill #1

## 2020-11-30 MED ORDER — METFORMIN HCL 1000 MG PO TABS
1000.0000 mg | ORAL_TABLET | Freq: Two times a day (BID) | ORAL | 3 refills | Status: DC
  Filled 2020-11-30: qty 180, 90d supply, fill #0

## 2020-11-30 NOTE — Patient Instructions (Addendum)
   Also 3 scoops protein powder each day in liquid in milk or water.   Please bring your meter to our next visit.   [name] [phone]

## 2020-11-30 NOTE — Progress Notes (Signed)
Diabetes Self-Management Education  Visit Type: Follow-up  Appt. Start Time: 1015 Appt. End Time: 9024  11/30/2020  Mr. Lauren Aguayo, identified by name and date of birth, is a 55 y.o. male with a diagnosis of Diabetes:  [place] Kitchen Type 2  ASSESSMENT  Mr. Nolt is here with his wife who translates and does food shopping and cooking for him. They do not have his meter today. He eats few fruits and vegetables. He needs  extra vitamins C, A, Zinc for wound healing.  His weight is stable indicating good intake which wife  and patient also report. Await a1c in November and to see meter readings.  Start MVI with mineral, encourage adequate protein and consider checking Vitamin and mineral labs needed for wound healing   Lab Results  Component Value Date   HGBA1C 12.0 (A) 09/19/2020   HGBA1C 11.8 (A) 08/17/2019   HGBA1C 12.3 (A) 04/28/2019   HGBA1C 9.1 (A) 01/06/2019   HGBA1C 11.1 (A) 03/10/2018    Estimated body mass index is 28.36 kg/m as calculated from the following:   Height as of an earlier encounter on 11/30/20: 6' (1.829 m).   Weight as of an earlier encounter on 11/30/20: 209 lb 1.6 oz (94.8 kg).  Wt Readings from Last 10 Encounters:  11/30/20 209 lb 1.6 oz (94.8 kg)  10/06/20 207 lb 1.6 oz (93.9 kg)  09/28/20 208 lb 4.8 oz (94.5 kg)  09/19/20 192 lb 14.4 oz (87.5 kg)  08/17/19 213 lb 8 oz (96.8 kg)  04/28/19 212 lb 4.8 oz (96.3 kg)  01/20/19 200 lb 9.6 oz (91 kg)  01/13/19 199 lb (90.3 kg)  01/06/19 199 lb 4.8 oz (90.4 kg)  10/13/18 205 lb 8 oz (93.2 kg)      Diabetes Self-Management Education - 11/30/20 1600       Visit Information   Visit Type Follow-up      Health Coping   How would you rate your overall health? Fair   had another amputation and was in the hospital for a while     Patient Education   Nutrition management  Carbohydrate counting;Reviewed blood glucose goals for pre and post meals and how to evaluate the patients' food intake on their blood glucose level.     Monitoring Purpose and frequency of SMBG.;Taught/discussed recording of test results and interpretation of SMBG.      Individualized Goals (developed by patient)   Nutrition Follow meal plan discussed   add multivitiman with minerals daily     Patient Self-Evaluation of Goals - Patient rates self as meeting previously set goals (% of time)   Physical Activity Not Applicable   he was unable to walk due to foot amputation     Outcomes   Expected Outcomes Demonstrated interest in learning. Expect positive outcomes    Future DMSE 4-6 wks    Program Status Not Completed      Subsequent Visit   Since your last visit have you continued or begun to take your medications as prescribed? Yes    Since your last visit have you had your blood pressure checked? Yes    Is your most recent blood pressure lower, unchanged, or higher since your last visit? Unchanged    Since your last visit have you experienced any weight changes? Gain    Weight Gain (lbs) 2    Since your last visit, are you checking your blood glucose at least once a day? Yes   149 fasting, 206 later in  thr day before his second meal            Individualized Plan for Diabetes Self-Management Training:   Learning Objective:  Patient will have a greater understanding of diabetes self-management. Patient education plan is to attend individual and/or group sessions per assessed needs and concerns.   Plan:   Patient Instructions    Also 3 scoops protein powder each day in liquid in milk or water.   Please bring your meter to our next visit.   [name] [phone]      Expected Outcomes:  Demonstrated interest in learning. Expect positive outcomes  Education material provided: Diabetes Resources  If problems or questions, patient to contact team via:  Phone  Future DSME appointment: 4-6 wks Debera Lat, RD 11/30/2020 4:52 PM.

## 2020-11-30 NOTE — Assessment & Plan Note (Deleted)
Diabetic Neuropathy Assessment: Diabetic foot exam performed today with decreased sensation bilaterally, small blister present as described above. No other skin breaks or concerning signs of ulcers or infection. Would likely benefit from custom or diabetic shoes and dry work environment to avoid complications. Treatment of concurrent onychomycosis will also reduce risk of further diabetic foot complications   ** Plan: -Encourage checking feet daily, comfortable shoes -Podiatry referral for further eval, custom shoes -Avoid working with wet shoes, feet. Letter to employer sent with pt **

## 2020-11-30 NOTE — Assessment & Plan Note (Addendum)
Amputation in October 2022 at [facility].  He is status post Daptomycin and clindamycin regimens while in the hospital at [facility].  Today, patient has dressings and CAM Walker intact to left foot.  Patient denies constitutional symptoms. Will be seen by wound care later today.  Also to be seen by podiatry later this month.

## 2020-11-30 NOTE — Telephone Encounter (Signed)
Call from [facility] - stated they received several rxs from the doctor who is not Medicaid enrolled. I will ask another team member to re-send rx's. Thanks

## 2020-11-30 NOTE — Patient Instructions (Signed)
Thank you, Mr.Sten Mirabal for allowing [place] to provide your care today. Today we discussed your diabetes medications.    I have ordered the following labs for you:  Lab Orders  No laboratory test(s) ordered today     Referrals ordered today:   Referral Orders  No referral(s) requested today     I have ordered the following medication/changed the following medications:   Stop the following medications: Medications Discontinued During This Encounter  Medication Reason   losartan-hydrochlorothiazide (HYZAAR) 50-12.5 MG tablet Reorder   metFORMIN (GLUCOPHAGE) 1000 MG tablet Reorder   insulin detemir (LEVEMIR FLEXTOUCH) 100 UNIT/ML FlexPen Reorder     Start the following medications: Meds ordered this encounter  Medications   insulin detemir (LEVEMIR FLEXTOUCH) 100 UNIT/ML FlexPen    Sig: Inject 25 Units into the skin 2 (two) times daily.    Dispense:  45 mL    Refill:  3   losartan-hydrochlorothiazide (HYZAAR) 50-12.5 MG tablet    Sig: Take 1 tablet by mouth daily.    Dispense:  90 tablet    Refill:  3   metFORMIN (GLUCOPHAGE) 1000 MG tablet    Sig: Take 1 tablet (1,000 mg total) by mouth 2 (two) times daily with a meal.    Dispense:  180 tablet    Refill:  3   Dulaglutide (TRULICITY) 1.5 KK/9.3GH SOPN    Sig: Inject 1.5 mg into the skin once a week.    Dispense:  6 mL    Refill:  3     Follow up: 3 months   Should you have any questions or concerns please call the internal medicine clinic at [phone].

## 2020-11-30 NOTE — Assessment & Plan Note (Deleted)
Refilled today

## 2020-11-30 NOTE — Assessment & Plan Note (Deleted)
Patient's BP today is ** with a goal of 125-130/80. The patient endorses adherence to his medication regimen.    Assessment/Plan: Patient close to goal BP today**. No further titration of medication today.  - BMP8+Anion Gap - losartan-hydrochlorothiazide (HYZAAR) 50-12.5 MG tablet, one tablet daily. If stable at next visit can prescribe for 3 months at a time.

## 2020-11-30 NOTE — Progress Notes (Signed)
   CC: DM follow-up  HPI:  [name] is a 55 y.o. 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 He is presenting to the [facility] for a 2 week follow up of his diabetes. Please see problem-based list for further details, assessments, and plans.   Past Medical History:  Diagnosis Date   Arthritis    left knee   Diabetes mellitus 2003   Hyperlipidemia    Review of Systems:  Review of Systems  Constitutional:  Negative for chills, fever and malaise/fatigue.  HENT: Negative.    Respiratory: Negative.  Negative for cough.   Cardiovascular: Negative.  Negative for chest pain.  Gastrointestinal:  Positive for nausea and vomiting. Negative for abdominal pain.  Genitourinary: Negative.  Negative for frequency and urgency.  Musculoskeletal: Negative.  Negative for myalgias.  Skin: Negative.   Neurological:  Positive for tingling. Negative for dizziness and weakness.  Endo/Heme/Allergies: Negative.   Psychiatric/Behavioral: Negative.      Physical Exam:  Vitals:   11/30/20 0857  BP: (!) 147/84  Pulse: 92  Temp: 98.2 F (36.8 C)  TempSrc: Oral  SpO2: 98%  Weight: 209 lb 1.6 oz (94.8 kg)  Height: 6' (1.829 m)   Physical Exam Constitutional:      General: He is not in acute distress. HENT:     Head: Normocephalic and atraumatic.  Eyes:     Extraocular Movements: Extraocular movements intact.     Pupils: Pupils are equal, round, and reactive to light.  Cardiovascular:     Rate and Rhythm: Normal rate and regular rhythm.     Heart sounds: No murmur heard.   No friction rub. No gallop.  Pulmonary:     Effort: Pulmonary effort is normal.     Breath sounds: Normal breath sounds. No wheezing, rhonchi or rales.  Abdominal:     General: Bowel sounds are normal. There is no distension.     Palpations: Abdomen is soft.     Tenderness: There is no abdominal  tenderness.  Musculoskeletal:        General: No swelling.     Cervical back: Neck supple.     Left Lower Extremity: (s/p partial excision of L toes 1-5) Feet:     Left foot:     Protective Sensation: 3 sites tested.  3 sites sensed.     Skin integrity: No ulcer, blister or warmth.  Skin:    General: Skin is warm and dry.  Neurological:     General: No focal deficit present.     Mental Status: He is alert and oriented to person, place, and time. Mental status is at baseline.  Psychiatric:        Mood and Affect: Mood normal.        Behavior: Behavior normal.     Assessment & Plan:   See Encounters Tab for problem based charting.  Patient seen with Dr. Heber [place]

## 2020-11-30 NOTE — Assessment & Plan Note (Addendum)
Patient is currently on metformin 1000 mg twice daily, Trulicity 1.5 mg weekly injection, and Levemir 25 units twice daily.  His sugars are in the 190s normally, up to 250s after eating.  He states that he has been compliant with all of these medications and has no issues taking them.  He has occasional tingling, however he denies other symptoms such as frequency and dysuria.  He denies fevers and chills since he was in the hospital last month.  He has been using a cam walker for ambulation and has an appointment with wound care later today.  Also followed by podiatry.  Repeat A1c during last hospitalization was 9.9, down from 12 in August 2022.     P: It appears that some of his diabetes medications have either been sent to a different pharmacy from [facility] or have not been refilled for the past few months, though patient endorses compliance of all aforementioned medications. Today, we have sent Levemir 25 units twice daily, metformin 1000 mg twice daily, and Trulicity 1.5 mg weekly to [place] for ease of access to his medications. Follow-up in 3 months for DM check.

## 2020-11-30 NOTE — Assessment & Plan Note (Signed)
BP of 147/84 today.  Patient endorses adherence to this medication.  P: We will send losartan-hydrochlorothiazide 50-12.5 mg daily to [place] for ease of access as it appears that there may have been confusion on which pharmacy patient needs to pick up medications.

## 2020-11-30 NOTE — Assessment & Plan Note (Addendum)
Flu shot given today

## 2020-12-05 ENCOUNTER — Other Ambulatory Visit (HOSPITAL_COMMUNITY): Payer: Self-pay

## 2020-12-05 MED ORDER — CEPHALEXIN 500 MG PO CAPS
1000.0000 mg | ORAL_CAPSULE | Freq: Three times a day (TID) | ORAL | 0 refills | Status: AC
  Filled 2020-12-05: qty 36, 6d supply, fill #0

## 2020-12-05 MED ORDER — CIPROFLOXACIN HCL 500 MG PO TABS
500.0000 mg | ORAL_TABLET | Freq: Two times a day (BID) | ORAL | 0 refills | Status: DC
  Filled 2020-12-05: qty 12, 6d supply, fill #0

## 2020-12-06 ENCOUNTER — Other Ambulatory Visit (HOSPITAL_COMMUNITY): Payer: Self-pay

## 2020-12-06 MED ORDER — SANTYL 250 UNIT/GM EX OINT
TOPICAL_OINTMENT | CUTANEOUS | 0 refills | Status: DC
  Filled 2020-12-06 (×2): qty 30, 30d supply, fill #0

## 2020-12-07 NOTE — Progress Notes (Signed)
Internal Medicine Clinic Attending ° °Case discussed with Dr. Bonanno  °  At the time of the visit.  We reviewed the resident’s history and exam and pertinent patient test results.  I agree with the assessment, diagnosis, and plan of care documented in the resident’s note. ° °

## 2020-12-20 ENCOUNTER — Other Ambulatory Visit: Payer: Self-pay | Admitting: Dietician

## 2020-12-20 ENCOUNTER — Encounter: Payer: Self-pay | Admitting: Internal Medicine

## 2020-12-20 ENCOUNTER — Ambulatory Visit (INDEPENDENT_AMBULATORY_CARE_PROVIDER_SITE_OTHER): Payer: Medicaid Other | Admitting: Internal Medicine

## 2020-12-20 ENCOUNTER — Ambulatory Visit (INDEPENDENT_AMBULATORY_CARE_PROVIDER_SITE_OTHER): Payer: Medicaid Other | Admitting: Dietician

## 2020-12-20 ENCOUNTER — Other Ambulatory Visit: Payer: Self-pay

## 2020-12-20 VITALS — BP 133/78 | HR 79 | Temp 98.3°F | Resp 18 | Ht 72.0 in | Wt 212.7 lb

## 2020-12-20 DIAGNOSIS — Z794 Long term (current) use of insulin: Secondary | ICD-10-CM

## 2020-12-20 DIAGNOSIS — E1140 Type 2 diabetes mellitus with diabetic neuropathy, unspecified: Secondary | ICD-10-CM

## 2020-12-20 DIAGNOSIS — E1165 Type 2 diabetes mellitus with hyperglycemia: Secondary | ICD-10-CM

## 2020-12-20 NOTE — Progress Notes (Signed)
Patient is here today sooner than his scheduled appointment to talk about a continuous glucometer. He otherwise has no issues and takes his medications as prescribed. He will see Barry Brunner this afternoon to discuss this further. Follow-up in 2 months in our clinic.

## 2020-12-20 NOTE — Progress Notes (Addendum)
Diabetes Self-Management Education  Visit Type: Follow-up (5th visit this year)  Appt. Start Time: 930 Appt. End Time: 1030  12/20/2020  [name], identified by name and date of birth, is a 55 y.o. male with a diagnosis of Diabetes:  type 2   ASSESSMENT  Patient presents with his wife who is supportive and cooks for him. She verbalized good understanding to the diabetes meter interpretation and explained it to the patient. We also used interpreter [phone] (Ahmed).   Offered personal CGM per wound care recommendation. They are interested in learning to use the Freestyle [place] 2 CGM and wanted to use a free sample today, but he was unable to download he App on his phone. They agreed to have me request rxs from their doctor and bring it back in 1 month to learn how to use it.   METER DOWNLOAD Report summary is from last 30 days,  Average tests per day: 1.3 Average blood glucose: 198 Range: minimum: 78 and maximum: 383 Days without test: 6 % in target range: 33 (13/39) % below target range: 1 % above target range: 60 (23/39) % hypoglycemia: 0 Notes about patterns: he says that he did not realize the target zone is 80-180 and that his body does not give him symptoms of low blood sugar when it is < 100 like it used to do. Concerned that he should be using a One Touch meter to get lower priced strips as it is preferred by his new insurance company.   Reportedly recent A1C was lower (9.9% on 11-06-20) at recent hospitalization. Improving  Lab Results  Component Value Date   HGBA1C 12.0 (A) 09/19/2020   HGBA1C 11.8 (A) 08/17/2019   HGBA1C 12.3 (A) 04/28/2019   HGBA1C 9.1 (A) 01/06/2019   HGBA1C 11.1 (A) 03/10/2018      Estimated body mass index is 28.85 kg/m as calculated from the following:   Height as of an earlier encounter on 12/20/20: 6' (1.829 m).   Weight as of an earlier encounter on 12/20/20: 212 lb 11.2 oz (96.5 kg).  Wt Readings from Last 10 Encounters:  12/20/20  212 lb 11.2 oz (96.5 kg)  11/30/20 209 lb 1.6 oz (94.8 kg)  10/06/20 207 lb 1.6 oz (93.9 kg)  09/28/20 208 lb 4.8 oz (94.5 kg)  09/19/20 192 lb 14.4 oz (87.5 kg)  08/17/19 213 lb 8 oz (96.8 kg)  04/28/19 212 lb 4.8 oz (96.3 kg)  01/20/19 200 lb 9.6 oz (91 kg)  01/13/19 199 lb (90.3 kg)  01/06/19 199 lb 4.8 oz (90.4 kg)      Diabetes Self-Management Education - 12/20/20 1000       Visit Information   Visit Type Follow-up   5th visit this year     Health Coping   How would you rate your overall health? Good      Patient Education   Medications Reviewed medication adjustment guidelines for hyperglycemia and sick days.    Monitoring Other (comment)   interpreting meter information and education on personal CGMs   Personal strategies to promote health Lifestyle issues that need to be addressed for better diabetes care   when he fasts, he has higher blood sugars later due to larger amounts of foods     Patient Self-Evaluation of Goals - Patient rates self as meeting previously set goals (% of time)   Nutrition 50 - 75 %    Physical Activity Not Applicable   not able to walk yet until  he gets special shoes, may need to do non weight bearing activity     Outcomes   Expected Outcomes Demonstrated interest in learning. Expect positive outcomes    Future DMSE 4-6 wks    Program Status Not Completed      Subsequent Visit   Since your last visit have you continued or begun to take your medications as prescribed? Yes   takes extra insulin at times to "get blood sugar down when high"   Since your last visit have you had your blood pressure checked? Yes    Is your most recent blood pressure lower, unchanged, or higher since your last visit? Lower    Since your last visit have you experienced any weight changes? Gain    Weight Gain (lbs) 3    Since your last visit, are you checking your blood glucose at least once a day? Yes             Individualized Plan for Diabetes  Self-Management Training:   Learning Objective:  Patient will have a greater understanding of diabetes self-management. Patient education plan is to attend individual and/or group sessions per assessed needs and concerns.   Plan:   Patient Instructions  Please come to your December 20 appointment at 11 am.   Bring 3 things with you to this appointment-   1- Reader 2- sensor 3- phone if able to download Freestyle libre 2 App.   [name] [phone]  ???? ?????? ??? ???? 20 ?????? ?????? 11 ??????.  ????? 3 ????? ??? ?? ??? ??????-  1- ?????? 2- ???? ??????? 3- ?????? ??? ??? ?????? ??? ????? ????? Freestyle libre 2.  ???? [phone] yurjaa alhudur 'iilaa maweid 20 disambir alsaaeat 11 sbahan. 'iihdar 3 'ashya' maeak fi hadha almaweidi- 1- alqari 2- Virgel Paling astishear 3- alhatif 'iidha kan qadran ealaa tanzil tatbiq Freestyle libre 2. duna [phone]  Expected Outcomes:  Demonstrated interest in learning. Expect positive outcomes  Education material provided: Diabetes Resources  If problems or questions, patient to contact team via:  Phone  Future DSME appointment: 4-6 wks Debera Lat, RD 12/20/2020 11:06 AM.

## 2020-12-20 NOTE — Telephone Encounter (Signed)
Patient request Continuous glucose monitoring

## 2020-12-20 NOTE — Patient Instructions (Addendum)
Please come to your December 20 appointment at 11 am.   Bring 3 things with you to this appointment-   1- Reader 2- sensor 3- phone if able to download Freestyle libre 2 App.   [name] [phone]  ???? ?????? ??? ???? 20 ?????? ?????? 11 ??????.  ????? 3 ????? ??? ?? ??? ??????-  1- ?????? 2- ???? ??????? 3- ?????? ??? ??? ?????? ??? ????? ????? Freestyle libre 2.  ???? [phone] yurjaa alhudur 'iilaa maweid 20 disambir alsaaeat 11 sbahan. 'iihdar 3 'ashya' maeak fi hadha almaweidi- 1- alqari 2- Virgel Paling astishear 3- alhatif 'iidha kan qadran ealaa tanzil tatbiq Freestyle libre 2. duna [phone]

## 2020-12-20 NOTE — Progress Notes (Incomplete Revision)
Diabetes Self-Management Education  Visit Type: Follow-up (5th visit this year)  Appt. Start Time: 930 Appt. End Time: 1030  12/20/2020  [name], identified by name and date of birth, is a 55 y.o. male with a diagnosis of Diabetes:  type 2   ASSESSMENT  Patient presents with his wife who is supportive and cooks for him. She verbalized good understanding to the diabetes meter interpretation and explained it to the patient. We also used interpreter [phone] (Ahmed).   Offered personal CGM per wound care recommendation. They are interested in learning to use the Freestyle [place] 2 CGM and wanted to use a free sample today, but he was unable to download he App on his phone. They agreed to have me request rxs from their doctor and bring it back in 1 month to learn how to use it.   METER DOWNLOAD Report summary is from last 30 days,  Average tests per day: 1.3 Average blood glucose: 198 Range: minimum: 78 and maximum: 383 Days without test: 6 % in target range: 33 (13/39) % below target range: 1 % above target range: 60 (23/39) % hypoglycemia: 0 Notes about patterns: he says that he did not realize the target zone is 80-180 and that his body does not give him symptoms of low blood sugar when it is < 100 like it used to do. Concerned that he should be using a One Touch meter to get lower priced strips as it is preferred by his new insurance company.   Reportedly recent A1C was lower (9.9% on 11-06-20) at recent hospitalization. Improving  Lab Results  Component Value Date   HGBA1C 12.0 (A) 09/19/2020   HGBA1C 11.8 (A) 08/17/2019   HGBA1C 12.3 (A) 04/28/2019   HGBA1C 9.1 (A) 01/06/2019   HGBA1C 11.1 (A) 03/10/2018      Estimated body mass index is 28.85 kg/m as calculated from the following:   Height as of an earlier encounter on 12/20/20: 6' (1.829 m).   Weight as of an earlier encounter on 12/20/20: 212 lb 11.2 oz (96.5 kg).  Wt Readings from Last 10 Encounters:  12/20/20  212 lb 11.2 oz (96.5 kg)  11/30/20 209 lb 1.6 oz (94.8 kg)  10/06/20 207 lb 1.6 oz (93.9 kg)  09/28/20 208 lb 4.8 oz (94.5 kg)  09/19/20 192 lb 14.4 oz (87.5 kg)  08/17/19 213 lb 8 oz (96.8 kg)  04/28/19 212 lb 4.8 oz (96.3 kg)  01/20/19 200 lb 9.6 oz (91 kg)  01/13/19 199 lb (90.3 kg)  01/06/19 199 lb 4.8 oz (90.4 kg)      Diabetes Self-Management Education - 12/20/20 1000       Visit Information   Visit Type Follow-up   5th visit this year     Health Coping   How would you rate your overall health? Good      Patient Education   Medications Reviewed medication adjustment guidelines for hyperglycemia and sick days.    Monitoring Other (comment)   interpreting meter information and education on personal CGMs   Personal strategies to promote health Lifestyle issues that need to be addressed for better diabetes care   when he fasts, he has higher blood sugars later due to larger amounts of foods     Patient Self-Evaluation of Goals - Patient rates self as meeting previously set goals (% of time)   Nutrition 50 - 75 %    Physical Activity Not Applicable   not able to walk yet until  he gets special shoes, may need to do non weight bearing activity     Outcomes   Expected Outcomes Demonstrated interest in learning. Expect positive outcomes    Future DMSE 4-6 wks    Program Status Not Completed      Subsequent Visit   Since your last visit have you continued or begun to take your medications as prescribed? Yes   takes extra insulin at times to "get blood sugar down when high"   Since your last visit have you had your blood pressure checked? Yes    Is your most recent blood pressure lower, unchanged, or higher since your last visit? Lower    Since your last visit have you experienced any weight changes? Gain    Weight Gain (lbs) 3    Since your last visit, are you checking your blood glucose at least once a day? Yes             Individualized Plan for Diabetes  Self-Management Training:   Learning Objective:  Patient will have a greater understanding of diabetes self-management. Patient education plan is to attend individual and/or group sessions per assessed needs and concerns.   Plan:   Patient Instructions  Please come to your December 20 appointment at 11 am.   Bring 3 things with you to this appointment-   1- Reader 2- sensor 3- phone if able to download Freestyle libre 2 App.   [name] [phone]  ???? ?????? ??? ???? 20 ?????? ?????? 11 ??????.  ????? 3 ????? ??? ?? ??? ??????-  1- ?????? 2- ???? ??????? 3- ?????? ??? ??? ?????? ??? ????? ????? Freestyle libre 2.  ???? [phone] yurjaa alhudur 'iilaa maweid 20 disambir alsaaeat 11 sbahan. 'iihdar 3 'ashya' maeak fi hadha almaweidi- 1- alqari 2- Virgel Paling astishear 3- alhatif 'iidha kan qadran ealaa tanzil tatbiq Freestyle libre 2. duna [phone]  Expected Outcomes:  Demonstrated interest in learning. Expect positive outcomes  Education material provided: Diabetes Resources  If problems or questions, patient to contact team via:  Phone  Future DSME appointment: 4-6 wks Debera Lat, RD 12/20/2020 11:06 AM.

## 2020-12-21 ENCOUNTER — Encounter: Payer: Self-pay | Admitting: Dietician

## 2020-12-21 MED ORDER — FREESTYLE LIBRE 2 READER DEVI: 0 refills | Status: DC

## 2020-12-21 MED ORDER — FREESTYLE LIBRE 2 SENSOR MISC: 3 refills | Status: DC

## 2020-12-26 NOTE — Progress Notes (Signed)
This patient did not complete a clinic visit, as his sole purpose for appt was discussion with RD which is separately scheduled for later in the  morning.[name]  No charge.

## 2020-12-27 ENCOUNTER — Telehealth: Payer: Self-pay

## 2020-12-27 NOTE — Telephone Encounter (Signed)
PA  for pt ( FREESTYLE LIBRE2 READER ) came through on cover my meds was done and sent back to [facility] awaiting approval or denial

## 2021-01-10 NOTE — Progress Notes (Signed)
Encounter reviewed and approved.

## 2021-01-11 ENCOUNTER — Other Ambulatory Visit (HOSPITAL_COMMUNITY): Payer: Self-pay

## 2021-01-17 ENCOUNTER — Ambulatory Visit: Payer: Medicaid Other | Admitting: Dietician

## 2021-01-19 ENCOUNTER — Other Ambulatory Visit (HOSPITAL_COMMUNITY): Payer: Self-pay

## 2021-02-07 ENCOUNTER — Emergency Department (HOSPITAL_COMMUNITY)
Admission: EM | Admit: 2021-02-07 | Discharge: 2021-02-07 | Disposition: A | Discharge status: Home / Self Care | Payer: Medicaid Other | Attending: Emergency Medicine | Admitting: Emergency Medicine

## 2021-02-07 DIAGNOSIS — R739 Hyperglycemia, unspecified: Secondary | ICD-10-CM | POA: Diagnosis present

## 2021-02-07 DIAGNOSIS — Z5321 Procedure and treatment not carried out due to patient leaving prior to being seen by health care provider: Secondary | ICD-10-CM | POA: Diagnosis not present

## 2021-02-07 LAB — BASIC METABOLIC PANEL
Anion gap: 11 (ref 5–15)
BUN: 32 mg/dL — ABNORMAL HIGH (ref 6–20)
CO2: 18 mmol/L — ABNORMAL LOW (ref 22–32)
Calcium: 9.4 mg/dL (ref 8.9–10.3)
Chloride: 102 mmol/L (ref 98–111)
Creatinine, Ser: 1.71 mg/dL — ABNORMAL HIGH (ref 0.61–1.24)
GFR, Estimated: 46 mL/min — ABNORMAL LOW (ref >60)
Glucose, Bld: 489 mg/dL — ABNORMAL HIGH (ref 70–99)
Potassium: 4.3 mmol/L (ref 3.5–5.1)
Sodium: 131 mmol/L — ABNORMAL LOW (ref 135–145)

## 2021-02-07 LAB — CBC WITH DIFFERENTIAL/PLATELET
Abs Immature Granulocytes: 0.02 10*3/uL (ref 0.00–0.07)
Basophils Absolute: 0 10*3/uL (ref 0.0–0.1)
Basophils Relative: 1 %
Eosinophils Absolute: 0.3 10*3/uL (ref 0.0–0.5)
Eosinophils Relative: 4 %
HCT: 38.2 % — ABNORMAL LOW (ref 39.0–52.0)
Hemoglobin: 13.2 g/dL (ref 13.0–17.0)
Immature Granulocytes: 0 %
Lymphocytes Relative: 28 %
Lymphs Abs: 2.3 10*3/uL (ref 0.7–4.0)
MCH: 27 pg (ref 26.0–34.0)
MCHC: 34.6 g/dL (ref 30.0–36.0)
MCV: 78.3 fL — ABNORMAL LOW (ref 80.0–100.0)
Monocytes Absolute: 0.6 10*3/uL (ref 0.1–1.0)
Monocytes Relative: 8 %
Neutro Abs: 4.8 10*3/uL (ref 1.7–7.7)
Neutrophils Relative %: 59 %
Platelets: 239 10*3/uL (ref 150–400)
RBC: 4.88 MIL/uL (ref 4.22–5.81)
RDW: 12.4 % (ref 11.5–15.5)
WBC: 8 10*3/uL (ref 4.0–10.5)
nRBC: 0 % (ref 0.0–0.2)

## 2021-02-07 LAB — URINALYSIS, ROUTINE W REFLEX MICROSCOPIC
Bilirubin Urine: NEGATIVE
Glucose, UA: >=500 mg/dL — AB
Ketones, ur: NEGATIVE mg/dL
Leukocytes,Ua: NEGATIVE
Nitrite: NEGATIVE
Protein, ur: NEGATIVE mg/dL
Specific Gravity, Urine: 1.01 (ref 1.005–1.030)
pH: 5.5 (ref 5.0–8.0)

## 2021-02-07 LAB — URINALYSIS, MICROSCOPIC (REFLEX)
Bacteria, UA: NONE SEEN
RBC / HPF: NONE SEEN RBC/hpf (ref 0–5)

## 2021-02-07 LAB — CBG MONITORING, ED: Glucose-Capillary: 489 mg/dL — ABNORMAL HIGH (ref 70–99)

## 2021-02-07 NOTE — ED Notes (Addendum)
489 CBG

## 2021-02-07 NOTE — ED Notes (Addendum)
Called 1x for vitals

## 2021-02-07 NOTE — ED Notes (Signed)
Pt called 3x+ times for vital recheck w/o response. Pt appears not to be present at this time.

## 2021-02-07 NOTE — ED Notes (Signed)
Called for a vital check patient didn't answe

## 2021-02-07 NOTE — ED Provider Triage Note (Signed)
Emergency Medicine Provider Triage Evaluation Note  [name] , a 56 y.o. male  was evaluated in triage.  Pt complains of hyperglycemia.  States he checked his blood sugar tonight at 10pm and it was >500.  Denies any symptoms.  Denies recent illness.  Review of Systems  Positive: hyperglycemia Negative: Fever, chills  Physical Exam  BP (!) 155/106 (BP Location: Right Arm)    Pulse (!) 101    Temp 98 F (36.7 C)    Resp 16    SpO2 100%  Gen:   Awake, no distress   Resp:  Normal effort  MSK:   Moves extremities without difficulty  Other:    Medical Decision Making  Medically screening exam initiated at 12:43 AM.  Appropriate orders placed.  [name] was informed that the remainder of the evaluation will be completed by another provider, this initial triage assessment does not replace that evaluation, and the importance of remaining in the ED until their evaluation is complete.  Hyperglycemia   Montine Circle, PA-C 02/07/21 0045

## 2021-02-07 NOTE — ED Triage Notes (Signed)
Pt c/o high CBG, states he had one reading approx 2200, reading was around 500. Compliant w medications. No symptoms associated, denies sick symptoms, sick contact.

## 2021-05-02 ENCOUNTER — Encounter: Payer: Self-pay | Admitting: Internal Medicine

## 2021-05-02 ENCOUNTER — Other Ambulatory Visit (HOSPITAL_COMMUNITY): Payer: Self-pay

## 2021-05-02 ENCOUNTER — Ambulatory Visit: Payer: Medicaid Other | Admitting: Internal Medicine

## 2021-05-02 VITALS — BP 136/77 | HR 81 | Temp 98.2°F | Wt 209.9 lb

## 2021-05-02 DIAGNOSIS — E1140 Type 2 diabetes mellitus with diabetic neuropathy, unspecified: Secondary | ICD-10-CM

## 2021-05-02 DIAGNOSIS — I10 Essential (primary) hypertension: Secondary | ICD-10-CM

## 2021-05-02 DIAGNOSIS — E1169 Type 2 diabetes mellitus with other specified complication: Secondary | ICD-10-CM | POA: Diagnosis not present

## 2021-05-02 DIAGNOSIS — E1165 Type 2 diabetes mellitus with hyperglycemia: Secondary | ICD-10-CM | POA: Diagnosis present

## 2021-05-02 DIAGNOSIS — Z794 Long term (current) use of insulin: Secondary | ICD-10-CM

## 2021-05-02 DIAGNOSIS — Z89439 Acquired absence of unspecified foot: Secondary | ICD-10-CM | POA: Diagnosis not present

## 2021-05-02 DIAGNOSIS — E785 Hyperlipidemia, unspecified: Secondary | ICD-10-CM | POA: Diagnosis not present

## 2021-05-02 DIAGNOSIS — Z0000 Encounter for general adult medical examination without abnormal findings: Secondary | ICD-10-CM

## 2021-05-02 LAB — GLUCOSE, CAPILLARY: Glucose-Capillary: 468 mg/dL — ABNORMAL HIGH (ref 70–99)

## 2021-05-02 LAB — POCT GLYCOSYLATED HEMOGLOBIN (HGB A1C): Hemoglobin A1C: 13.5 % — AB (ref 4.0–5.6)

## 2021-05-02 NOTE — Assessment & Plan Note (Signed)
Assessment: Counseled patient on FIT testing for colon cancer, which he denies having done in the past despite it being on his problem list/medical history. He does not have a history of colon cancer in the family and denies bloody or dark stools. Also counseled him on annual eye exam. ?Plan: Order supplies for FIT/refer to GI for colonoscopy and obtain lipid panel at next visit. ?

## 2021-05-02 NOTE — Assessment & Plan Note (Signed)
Assessment: Patient denies neuropathy at today's visit. Foot exam revealed normal sensation and no wounds or sores with bilateral 2+ DP pulses. ?Plan: Patient has not picked up gabapentin since November 2022. I will take this off of his medication list as he denies experiencing neuropathy. ?

## 2021-05-02 NOTE — Assessment & Plan Note (Signed)
Assessment: Last lipid panel that I can find was done in August 2022 at which time LDL was at goal at 51. Currently managed with atorvastatin 40 mg daily though he reported not taking this medication 09/2020 and last dispense was 08/2020. ?Plan: Counseled patient on several refills remaining at pharmacy for this medication and the importance of taking it to decrease risk of heart attack and stroke. Continue atorvastatin 40 mg daily. ?

## 2021-05-02 NOTE — Assessment & Plan Note (Addendum)
Assessment: Regimen consists of Trulicity 1.5 mg injection weekly, Levemir 25 units BID, metformin 1,000 mg BID. On initial discussion patient endorses adherence to this regimen, however when questioned further he admits that he has not picked up his medications in quite some time as he did not know there were refills at the pharmacy. POC HbA1c was 13.5% today, remarkably increased from 9.9% in 10/2020, with blood glucose of 468. Patient endorsed normal thirst and urinary frequency and denied fatigue, brain fog or confusion, weakness, malaise. He had normal breathing with no note of altered breath smells. Skin exam showed some drier areas with decreased skin turgor concerning for dehydration from diuresis 2/2 severe hyperglycemia. Concern is low at this time for DKA.  ? ?He did bring in his Accu-Chek glucometer however readings are not recorded after 03/21/2021. In the time that readings were saved, the data show: ? ?BG average: 353 ?SD: 102.2 ?High: 591 ?Low: 169 ? ?He is severely uncontrolled and I feel this is 2/2 not picking up his medications. He denies financial barriers to obtaining these medications. I have counseled he and his son on the process of getting and picking up refills, in addition to the risk of kidney disease, heart attack, and stroke that are associated with uncontrolled diabetes as his is. ? ?Plan: Counseled patient on how to obtain refills from the pharmacy (call and request medication when he is close to running out, then going to pick it up) and that he had refills in the system for his diabetes medications. I have asked that he pick up trulicity and levemir and start these medications again ASAP. He will follow-up in the next week or so prior to leaving for his work trip so that I can monitor how his blood sugar levels improve with him resuming insulin. Will hold off on metformin at this time pending renal function results.  ? ?ADDENDUM: BMP reveals AKI with serum creatinine 1.61, likely  prerenal in nature due to dehydration from diuresis 2/2 persistent hyperglycemia. For this reason I will continue to hold metformin to avoid further kidney injury. ?

## 2021-05-02 NOTE — Assessment & Plan Note (Signed)
Assessment: Bilateral transmetatarsal surgical sites are clean, closed without drainage, with no increased warmth, erythema, or edema. Bilateral DP pulses are 2+. He denies any current issues at amputation sites. ?Plan: Continue to monitor previous amputation sites. Importance of diabetes control in wound healing emphasized.  ?

## 2021-05-02 NOTE — Progress Notes (Signed)
? ?  CC: diabetes follow-up, medication refills ? ?HPI: ? ?Mr.Adain Geurin is a 56 y.o. male with past medical history as detailed below who presents for check-up for type 2 diabetes mellitus and to request 90-day medication refill supplies. ? ?Past Medical History:  ?Diagnosis Date  ? Arthritis   ? left knee  ? Diabetes mellitus 2003  ? 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 Hyperlipidemia   ? ?Review of Systems:   ?Review of Systems  ?Constitutional:  Negative for chills, diaphoresis, fever and malaise/fatigue.  ?Respiratory:  Negative for shortness of breath.   ?Cardiovascular:  Negative for chest pain and leg swelling.  ?Gastrointestinal:  Negative for abdominal pain, blood in stool, constipation, diarrhea, melena, nausea and vomiting.  ?Genitourinary:  Negative for dysuria, frequency and urgency.  ?Musculoskeletal:  Negative for falls and myalgias.  ?Neurological:  Negative for dizziness, tingling, sensory change, weakness and headaches.  ?Endo/Heme/Allergies:  Positive for polydipsia.  ? ? ?Physical Exam: ? ?Vitals:  ? 05/02/21 0921  ?BP: 136/77  ?Pulse: 81  ?Temp: 98.2 ?F (36.8 ?C)  ?TempSrc: Oral  ?SpO2: 100%  ?Weight: 209 lb 14.4 oz (95.2 kg)  ? ?Constitutional:Well-appearing gentleman in no acute distress. ?HENT:Oral mucosa is moist. ?Cardio:Regular rate and rhythm. No murmurs, rubs, gallops. Bilateral DP pulses 2+. ?Pulm:Clear to auscultation bilaterally. ?Abdomen:Soft, nontender, nondistended. ?ZWC:HENIDPOE for extremity edema. Bilateral transmetatarsal post-surgical changes without increased warmth, edema, erythema, drainage.  ?Skin:Skin is warm and dry with some flaking of skin on bilateral LE. No lesions or sores noted on distal extremities or feet bilaterally. Decreased skin turgor. ?Neuro:Awake, alert, oriented x3. No focal deficit noted. Sensation intact over bilateral feet. ?Psych:Normal mood and affect. ? ? ?Assessment & Plan:  ? ?See Encounters Tab for  problem based charting. ? ?Patient discussed with Dr. Jimmye Norman ?

## 2021-05-02 NOTE — Assessment & Plan Note (Addendum)
Assessment: BP appears to be controlled with measurement of 136/77 today. Initially he endorsed adherence to all medications however with further discussion he says that he has not picked up his medications in quite some time as he did not know there were refills at the pharmacy. It does not appear that he has picked this prescription up since November 2022, at which time a 90 day supply was dispensed, meaning that he has been without this medication for up to 2 months.  ?Plan: BP is at goal despite not currently taking anti-hypertensive medication. Despite renal-protective properties for his severely uncontrolled T2DM, I will have him discontinue this medication as his risk of hypotension outweigh the renal benefits at this time. Will also check renal function today. ? ?ADDENDUM: BMP showed elevated serum creatinine to 1.61 which is improved from 2 months ago, but still in AKI range based on several serum creatinine values over the last 2 years ranging 1.0-1.2. GFR 50 where previously noted to be >65. I feel these derangements are likely prerenal in nature due to dehydration from diuresis 2/2 persistent hyperglycemia. Will continue to hold losartan-HCTZ given these findings and at-goal BP. ?

## 2021-05-02 NOTE — Patient Instructions (Addendum)
????? ????? ? ???? ?? ????? ????? ??????? ?????. ??? ???? ???? ???? ??? ????? ?? ?????? ?? ?????? ????????. ? ?????? ???? ???????   [place] ?????? ????????? ?????? ??? ????? ????? ? ???? ??? ?? ???? ??? ????? ??????? ??????? ??? ???? ???? ??????? ?? ????? ?????? ???? ????????: ?- ???????????? ?- ????? ?- ?????? ? ????? ?????? ?? ?????? ?????? ???????? ?????? ??? ?? ???? ???? ????? ??? ????? ?? ?????? ?? ?? ????? ??? ???? ????? ????? ?? ????? ??? ?????? ????? ??. ? ?????: ??? ??? ???? ?? ????? ?? ????? ? ???? ??????? ???????? ??? [phone] ??? 9 ?????? ? 5 ????? ???? ????? ??????? ? ???? ??? [phone] ????? ???? ??????? ?????? ??? ?????. ??? ??? ???? ??? ????? ?? ???? ???? ????? ? ????? ??????? ???? 911. ? ?????? ??? ? ?? ? ? ?alsayid euthman ?kan min dawaei sururi muqabalatuk alyawma. 'ana saeid li'anak hadart hataa natamakan min altahaduth ean 'adwiatik waeubuatiha. ?eindama tatasil bisaydaliat Hart lilmardaa alkharijiiyn lilhusul ealaa eubuaat jadidat , 'urid faqat 'an tahsul ealaa eubawaat lil'adwiat altaaliat hataa yaeud eamaluk almukhbiriu min alyawm waladayna maweid lilmutabaeati: ?- 'aturfastatin ?- alhazm ?- lifmir ?yurjaa alta'akud min alhudur liziarat almutabaeat alkhasat bina fi ghudun 'ayaam qalilat hataa natamakan min alta'akud min 'ana ladayna khutat amnat lilmudii qdman fi rieayat marad alsukari alkhasi bika. ?tadhakaru: 'iidha kan ladayk 'ayu 'asyilat 'aw [employer] bieiadatina ealaa [phone] bayn 9 sbahan w 5 msa'an wabaed saeat alaitisal , aitasal ealaa [phone] waitlub tabib albatinat almuqim taht altalabi. 'iidha kunt tasheur 'anak tueani min halat tibiyat tariat , fayurjaa alaitisal biraqm 911. ?'iimili din , du ? ? ? ?Mr. Alene Mires, ? ?It was a pleasure to meet you today. I am glad that you came in so that we could talk about your medications and their refills. ? ?When you call the [facility] to get your refills, I only want for you to get  refills on the following medications until your lab work comes back from today and we have our follow-up appointment: ?-Atorvastatin ?-Trulicity ?-Levemir ? ?Please make sure to come in for our follow-up visit in a few days so that we can make sure we have a safe plan moving forward for your diabetes care. ? ?Remember: If you have any questions or concerns, please call our clinic at [phone] between 9am-5pm and after hours call [phone] and ask for the internal medicine resident on call. If you feel you are having a medical emergency please call 911. ? ?Farrel Gordon, DO ? ?

## 2021-05-03 LAB — BMP8+ANION GAP
Anion Gap: 15 mmol/L (ref 10.0–18.0)
BUN/Creatinine Ratio: 22 — ABNORMAL HIGH (ref 9–20)
BUN: 36 mg/dL — ABNORMAL HIGH (ref 6–24)
CO2: 18 mmol/L — ABNORMAL LOW (ref 20–29)
Calcium: 9.6 mg/dL (ref 8.7–10.2)
Chloride: 99 mmol/L (ref 96–106)
Creatinine, Ser: 1.61 mg/dL — ABNORMAL HIGH (ref 0.76–1.27)
Glucose: 449 mg/dL — ABNORMAL HIGH (ref 70–99)
Potassium: 5.1 mmol/L (ref 3.5–5.2)
Sodium: 132 mmol/L — ABNORMAL LOW (ref 134–144)
eGFR: 50 mL/min/{1.73_m2} — ABNORMAL LOW (ref >59)

## 2021-05-03 NOTE — Progress Notes (Signed)
Internal Medicine Clinic Attending ? ?Case discussed with Dr. Dean  At the time of the visit.  We reviewed the resident?s history and exam and pertinent patient test results.  I agree with the assessment, diagnosis, and plan of care documented in the resident?s note.  ?

## 2021-05-03 NOTE — Addendum Note (Signed)
Addended by: Renato Battles on: 05/03/2021 11:39 AM ? ? Modules accepted: Orders ? ?

## 2021-05-04 ENCOUNTER — Other Ambulatory Visit (HOSPITAL_COMMUNITY): Payer: Self-pay

## 2021-05-04 MED ORDER — INSULIN DETEMIR 100 UNIT/ML FLEXPEN
25.0000 [IU] | PEN_INJECTOR | Freq: Two times a day (BID) | SUBCUTANEOUS | 2 refills | Status: DC
  Filled 2021-05-04: qty 45, 90d supply, fill #0

## 2021-05-05 ENCOUNTER — Other Ambulatory Visit (HOSPITAL_COMMUNITY): Payer: Self-pay

## 2021-05-16 ENCOUNTER — Ambulatory Visit (INDEPENDENT_AMBULATORY_CARE_PROVIDER_SITE_OTHER): Payer: Medicaid Other | Admitting: Internal Medicine

## 2021-05-16 ENCOUNTER — Other Ambulatory Visit: Payer: Self-pay

## 2021-05-16 ENCOUNTER — Encounter: Payer: Self-pay | Admitting: Internal Medicine

## 2021-05-16 VITALS — BP 137/69 | HR 84 | Temp 97.8°F | Ht 74.0 in | Wt 209.4 lb

## 2021-05-16 DIAGNOSIS — Z794 Long term (current) use of insulin: Secondary | ICD-10-CM

## 2021-05-16 DIAGNOSIS — Z7984 Long term (current) use of oral hypoglycemic drugs: Secondary | ICD-10-CM

## 2021-05-16 DIAGNOSIS — I10 Essential (primary) hypertension: Secondary | ICD-10-CM

## 2021-05-16 DIAGNOSIS — E1165 Type 2 diabetes mellitus with hyperglycemia: Secondary | ICD-10-CM

## 2021-05-16 MED ORDER — INSULIN DETEMIR 100 UNIT/ML FLEXPEN: 28.0000 [IU] | PEN_INJECTOR | Freq: Two times a day (BID) | SUBCUTANEOUS | 2 refills | Status: DC

## 2021-05-16 MED ORDER — AMLODIPINE BESYLATE 5 MG PO TABS: 5.0000 mg | ORAL_TABLET | Freq: Every day | ORAL | 11 refills | Status: DC

## 2021-05-16 NOTE — Assessment & Plan Note (Addendum)
Patient is taking his blood sugar at least once daily and reports that the lowest reading he has seen is 185, highest of 225.  He generally sees blood sugar levels in the range of 180-220 in the morning when he checks.  He reports that he is taking his diabetes medication as prescribed, Trulicity 1.5 mg injection weekly and Levemir 25 units twice daily.  At this time he is observing Ramadan so his oral intake is different at this time.  At our last visit, his blood glucose was greater than 400 and I see that he was seen at [facility] later that day with a blood glucose of greater than 700. ?Assessment: Compared to our previous visit, his blood sugars under much better control, though still elevated.  Metformin was held at his last visit due to concerns of acute kidney injury, which he was found to have. ?Plan: Increase Levemir to 28 units twice daily.  We will plan to restart metformin if BMP shows resolution of acute kidney injury.  We will follow-up via telephone visit in 2 weeks and plan for in person visit when he returns from his work trip in 3 months. ? ?ADDENDUM: Metformin 1,000 mg BID has been resent for patient to start again. ?

## 2021-05-16 NOTE — Assessment & Plan Note (Addendum)
[name] was instructed at his last visit to continue to hold his antihypertensive medication, losartan-HCTZ 50-12.5 mg daily, as he was slightly hypotensive and there was concern for acute kidney injury which he was found to have.  He had not been taking this medication for some time. ? ?Today his initial blood pressure was 144/80 which improved to 137/69 with recheck.  He reports good p.o. intake at this time.  He has not had any symptoms that would suggest hyper or hypotensive episodes. ? ?Assessment: Patient would benefit from restarting an antihypertensive agent.  He is leaving for a 29-monthlong work trip in the next several days and will not be able to follow-up with our clinic for renal function monitoring. ?Plan: We will assess BMP today for improvement of AKI.  We will start patient on amlodipine 5 mg daily. ? ?ADDENDUM: BMP showed improvement of creatinine to 1.46 from 1.61. It seems that most of his metabolic panels have been obtained during acute illness so it is difficult to assess his true baseline, however I do see that 8 months ago his creatinine was in this range. This may be his new baseline though we will monitor routine labs for changes.  ?

## 2021-05-16 NOTE — Progress Notes (Addendum)
? ?CC: DM follow up ? ?HPI: ? ?SamuelSamuel Hernandez is a 56 y.o. male with past medical history as detailed below who presents today for 2-week follow-up of his type 2 diabetes.  Please see problem based charting for detailed assessment and plan. ? ?Video interpreter Marwa [phone] ? ?Past Medical History:  ?Diagnosis Date  ? Arthritis   ? left knee  ? Diabetes mellitus 2003  ? 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 Hyperlipidemia   ? Positive FIT (fecal immunochemical test) 08/02/2016  ? Preventative health care 12/25/2011  ? ?Review of Systems:   ?Review of Systems  ?Constitutional:  Negative for diaphoresis and malaise/fatigue.  ?Cardiovascular:  Negative for leg swelling.  ?Gastrointestinal:  Negative for abdominal pain, constipation, diarrhea, nausea and vomiting.  ?Genitourinary:  Negative for dysuria, frequency and urgency.  ?Neurological:  Negative for dizziness, tingling, focal weakness, loss of consciousness and headaches.  ? ? ?Physical Exam: ? ?Vitals:  ? 05/16/21 0840 05/16/21 0855  ?BP: (!) 144/80 137/69  ?Pulse: 89 84  ?Temp: 97.8 ?F (36.6 ?C)   ?TempSrc: Oral   ?SpO2: 100%   ?Weight: 209 lb 6.4 oz (95 kg)   ?Height: '6\' 2"'$  (1.88 m)   ? ?Constitutional: No acute distress. ?Cardio: Regular rate and rhythm.  No murmurs, rubs or gallops. ?Pulm: Clear to auscultation bilaterally.  Normal breathing on room air. ?Abdomen: Soft, nontender, nondistended. ?MSK: Negative for extremity edema. ?Skin: Skin is warm and dry without lacerations or abrasions. ?Neuro: Alert and oriented, no focal deficit noted. ?Psych: Pleasant mood and affect. ? ?Assessment & Plan:  ? ?Essential hypertension ?[name] was instructed at his last visit to continue to hold his antihypertensive medication, losartan-HCTZ 50-12.5 mg daily, as he was slightly hypotensive and there was concern for acute kidney injury which he was found to have.  He had not been taking this medication for some time. ? ?Today his  initial blood pressure was 144/80 which improved to 137/69 with recheck.  He reports good p.o. intake at this time.  He has not had any symptoms that would suggest hyper or hypotensive episodes. ? ?Assessment: Patient would benefit from restarting an antihypertensive agent.  He is leaving for a 62-monthlong work trip in the next several days and will not be able to follow-up with our clinic for renal function monitoring. ?Plan: We will assess BMP today for improvement of AKI.  We will start patient on amlodipine 5 mg daily. ? ?[place] ?Patient is taking his blood sugar at least once daily and reports that the lowest reading he has seen is 185, highest of 225.  He generally sees blood sugar levels in the range of 180-220 in the morning when he checks.  He reports that he is taking his diabetes medication as prescribed, Trulicity 1.5 mg injection weekly and Levemir 25 units twice daily.  At this time he is observing Ramadan so his oral intake is different at this time.  At our last visit, his blood glucose was greater than 400 and [facility] blood glucose of greater than 700. ?Assessment: Compared to our previous visit, his blood sugars under much better control, though still elevated.  Metformin was held at his last visit due to concerns of acute kidney injury, which he was found to have. ?Plan: Increase Levemir to 28 units twice daily.  We will plan to restart metformin if BMP shows resolution of acute kidney injury.  We will follow-up via telephone visit in 2 weeks and plan for in person visit when he returns from his work trip in 3 months.  ? ?See Encounters Tab for problem based charting. ? ?Patient discussed with Dr.  Saverio Danker ? ?

## 2021-05-16 NOTE — Patient Instructions (Addendum)
???? ?????? ?????! ????? ???????? [facility] ??????? ???????. ? ??????? ????? ??: ? ?1- ??? ??????: ?????? ?? ???? ?? ???????? ?????? ?? ?? ????? ?????? ??? ?? ????? ??? ???! ??? ?????? ? ??????? ????? ?? ???? ???? ???? ???? ???? ?????? ???? ???? ?? ??? ?????? ????? ???? ??????? ???????. ?- ??????? ???????: ???? ????? ????? ?????? ????? ?? ?? 25 ???? ??? 28 ???? ????? ??????. ?- ??????? ?????: BMP ?- ??????? ????: ????? ?????? ??? ??????? ?????? ???? ??? ????? ????? ?????. ??? ??? ??? ???? ????? ? ??????? ???? ?? ????? ??????????? ??? ????. ????? ?? ???? ?????????. ? ?2. ?????? ??? ????: ?? ??????? ????? ???? ???? ??? ???? ?????? ?? ???? ?????? ????. ??? ?? ???? ????? ?????? ?? ???? ?????? ??? ???? ???? ???? ????. ?- ?????? ??????: ???? ????????? 5 ??? ??????. ? ????? ???????? ?? ???? ??????? ??? ??????? ????????. ? ?????: ??? ??? ???? ?? ????? ?? ????? ? ???? ??????? ???????? ??? [phone] ??? 9 ?????? ? 5 ????? ???? ????? ??????? ? ???? ??? [phone] ????? ???? ??????? ?????? ??? ?????. ??? ???? ??? ????? ?? ???? ???? ????? ? ????? ??????? ???? 911. ? ?????? ??? ? ?? ?sarirt biruyatik alyawma! shkran liakhtiarik [facility] lirieayatik al'awaliati. ?tahadathna alyawm ean: ?1- marad alsukari: yuseiduni 'an 'asmae 'ana alsukariaat alkhasat bik qad tahasanat [name] 'an ra'aytuk akhar maratan! [name] , mustawayat alsukar fi aldam alati ladayk alan 'akthar amanan wa'ana wathiq min 'anah yumkinuna aleamal mean limuasalat tahsiniha. ?- taghyirat al'adwiati: yurjaa ziadat mustawa lifmir alkhasi bik min 25 wahdat 'iilaa 28 wahdat maratayn ywmyan. ?- almaeamil alyawma: BMP ?- taelimat 'ukhraa: sa'aqum bi'iieadat fahs almukhtabar li'iilqa' nazrat ealaa wazayif alkulaa alyawma. 'iidha kan hadha yabdu jydan , fasa'ajealuk tabda fi tanawul almitafwrmin maratan 'ukhraa. sa'atasil bik bihadhih altaelimati. ?2. airtifae daght aldum: lam tatanawali 'adwiatan lidaght aldam mundh fatrat  walyawm hu mujarad airtifae tafifi. 'awadu 'an 'abda bijureat munkhafidat min dawa'an litahsin daght aldam ladayk bishakl 'akbari. ?- taghayurat aldawa'i: yabda 'umludibin 5 mijama ywmyaan. ?yurjaa almutabaeat fi ghudun 'usbueayn eabr alziyarat alhatifiati. ?tadhakaru: 'iidha kan ladayk 'ayu 'asyilat 'aw [name] bieiadatina ealaa [phone] bayn 9 sbahan w 5 msa'an wabaed saeat alaitisal , aitasal ealaa [phone] waitlub tabib albatinat almuqim taht altalabi. 'iidha shaeart 'anak tueani min halat tibiyat tariat , fayurjaa alaitisal biraqm 911. ?'iimili din , du ? ? ? ?It was nice seeing you today! Thank you for choosing [facility] for your Primary Care.  ?  ?Today we talked about:  ? ?1.Diabetes: I am so glad to hear that your sugars have been improving a lot since I last saw you! As we discussed, the blood sugar ranges that you have now are much safer and I am confident that we can work together to continue to improve them. ? - Medication changes:Please increase your levemir from 25 units to 28 units twice daily.  ? - Labs today:BMP ? - Other instructions:I am going to recheck a lab to look at your kidney function today. If this looks good, I will have you start taking your metformin again. I will call you with these instructions. ? ?2. High blood pressure: You have not been taking medication for your blood pressure for a while and today it is just a little high. I would like to start you on a low dose of a medication to improve your blood pressure even more. ?- Medication changes:Start amlodipine 5 mg daily. ? ?Please follow up in 2 weeks via telephone visit. ? ?  Remember: If you have any questions or concerns, please call our clinic at [phone] between 9am-5pm and after hours call [phone] and ask for the internal medicine resident on call. If you feel you are having a medical emergency please call 911. ? ?Farrel Gordon, DO ?

## 2021-05-17 LAB — BMP8+ANION GAP
Anion Gap: 17 mmol/L (ref 10.0–18.0)
BUN/Creatinine Ratio: 21 — ABNORMAL HIGH (ref 9–20)
BUN: 31 mg/dL — ABNORMAL HIGH (ref 6–24)
CO2: 18 mmol/L — ABNORMAL LOW (ref 20–29)
Calcium: 9.6 mg/dL (ref 8.7–10.2)
Chloride: 102 mmol/L (ref 96–106)
Creatinine, Ser: 1.46 mg/dL — ABNORMAL HIGH (ref 0.76–1.27)
Glucose: 314 mg/dL — ABNORMAL HIGH (ref 70–99)
Potassium: 5 mmol/L (ref 3.5–5.2)
Sodium: 137 mmol/L (ref 134–144)
eGFR: 56 mL/min/{1.73_m2} — ABNORMAL LOW (ref >59)

## 2021-05-17 NOTE — Progress Notes (Signed)
Internal Medicine Clinic Attending ? ?Case discussed with Dr. Marlou Sa  At the time of the visit.  We reviewed the resident?s history and exam and pertinent patient test results.  I agree with the assessment, diagnosis, and plan of care documented in the resident?s note. Recommend urine ACR at next visit to evaluate for significant proteinuria 2/2 diabetic kidney disease vs other. ?

## 2021-05-18 ENCOUNTER — Telehealth: Payer: Self-pay | Admitting: Internal Medicine

## 2021-05-18 MED ORDER — METFORMIN HCL 1000 MG PO TABS: 1000.0000 mg | ORAL_TABLET | Freq: Two times a day (BID) | ORAL | 2 refills | Status: DC

## 2021-05-18 NOTE — Addendum Note (Signed)
Addended by: Renato Battles on: 05/18/2021 02:37 PM ? ? Modules accepted: Orders ? ?

## 2021-05-18 NOTE — Telephone Encounter (Signed)
Phone call made with assistance of interpreter, Quita Skye [phone], to let patient know that his blood work results showed improvement of his kidney labs and that I would like him to restart metformin 1,000 (1 tablet) twice daily. Prescription has been sent to [facility] on [address]. ? ?Farrel Gordon, DO ?

## 2021-05-30 ENCOUNTER — Ambulatory Visit (INDEPENDENT_AMBULATORY_CARE_PROVIDER_SITE_OTHER): Payer: Medicaid Other | Admitting: Internal Medicine

## 2021-05-30 DIAGNOSIS — E1165 Type 2 diabetes mellitus with hyperglycemia: Secondary | ICD-10-CM

## 2021-05-30 MED ORDER — TRULICITY 1.5 MG/0.5ML ~~LOC~~ SOAJ: 1.5000 mg | SUBCUTANEOUS | 3 refills | Status: DC

## 2021-05-30 NOTE — Progress Notes (Signed)
?  [facility] Telephone Encounter ?Continuity Care Appointment ? ?HPI:  ?This telephone encounter was created for [name] on 05/30/2021 for the following purpose/cc diabetes follow up.  ? ?Past Medical History:  ?Past Medical History:  ?Diagnosis Date  ? Arthritis   ? left knee  ? Diabetes mellitus 2003  ? 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 Hyperlipidemia   ? Positive FIT (fecal immunochemical test) 08/02/2016  ? Preventative health care 12/25/2011  ?  ? ?ROS:  ?Negative for low blood sugar, dizziness, abdominal pain, positive for high blood sugar  ? ?Assessment / Plan / Recommendations:  ?Please see A&P under problem oriented charting for assessment of the patient's acute and chronic medical conditions.  ?As always, pt is advised that if symptoms worsen or new symptoms arise, they should go to an urgent care facility or to to ER for further evaluation.  ? ?Consent and Medical Decision Making:  ?Patient discussed with Dr. Jimmye Norman ?This is a telephone encounter between Ainsley Sanguinetti and [place] on 05/30/2021 for diabetes. The visit was conducted with the patient located at home and [facility] at [facility]. The patient's identity was confirmed using their DOB and current address. The patient has consented to being evaluated through a telephone encounter and understands the associated risks (an examination cannot be done and the patient may need to come in for an appointment) / benefits (allows the patient to remain at home, decreasing exposure to coronavirus). I personally spent 21 minutes on medical discussion.   ? ? ?

## 2021-05-30 NOTE — Assessment & Plan Note (Signed)
Spoke with patient over the phone for follow up visit for diabetes due to patient traveling and not being able to come in for in person appointment. Interpreter had difficulty communicating in patient's dialect so visit was technically challenging.  ? ?Upon clarification about the patient's diabetes medications, he was taking levemir 28 units and metformin but was under the impression he was supposed to stop taking the trulicity. He has not taken the trulicity for the last month. Sugars have been mostly running 210-215 with the highest recording at 285 and no reported lows. Discussed restarting trulicity and confirmed patient understanding with teachback method. Less than ideal visit given difficulties outlined above. Glycemic control sub-optimal likely related to stopping trulicity, though improved from >400 two weeks prior. Patient agreeable to follow up with meter in person to make more adjustments in 1-2 months.  ? ?Plan ?- restart trulicity ?- continue metformin and levemir 28 units ?

## 2021-05-30 NOTE — Addendum Note (Signed)
Addended by: Inda Coke on: 05/30/2021 11:34 AM ? ? Modules accepted: Orders ? ?

## 2021-06-02 NOTE — Progress Notes (Signed)
Internal Medicine Clinic Attending ? ?Case discussed with Dr. Ileene Musa  At the time of the visit.  We reviewed the resident?s history and exam and pertinent patient test results.  I agree with the assessment, diagnosis, and plan of care documented in the resident?s note. Significant barriers to care - cultural and language.  Agree with case management assistance. ?

## 2021-07-07 ENCOUNTER — Other Ambulatory Visit (HOSPITAL_COMMUNITY): Payer: Self-pay

## 2021-07-07 ENCOUNTER — Other Ambulatory Visit: Payer: Self-pay | Admitting: Internal Medicine

## 2021-07-07 DIAGNOSIS — E1165 Type 2 diabetes mellitus with hyperglycemia: Secondary | ICD-10-CM

## 2021-07-10 MED ORDER — INSULIN DETEMIR 100 UNIT/ML FLEXPEN: 28.0000 [IU] | PEN_INJECTOR | Freq: Two times a day (BID) | SUBCUTANEOUS | 2 refills | Status: DC

## 2021-07-10 MED ORDER — METFORMIN HCL 1000 MG PO TABS
1000.0000 mg | ORAL_TABLET | Freq: Two times a day (BID) | ORAL | 2 refills | Status: DC
Start: 2021-07-10 — End: 2021-07-26

## 2021-07-10 MED ORDER — TRULICITY 1.5 MG/0.5ML ~~LOC~~ SOAJ: 1.5000 mg | SUBCUTANEOUS | 3 refills | Status: DC

## 2021-07-10 NOTE — Addendum Note (Signed)
Addended by: Orvis Brill on: 07/10/2021 03:09 PM   Modules accepted: Orders

## 2021-07-11 ENCOUNTER — Other Ambulatory Visit (HOSPITAL_COMMUNITY): Payer: Self-pay

## 2021-07-17 ENCOUNTER — Encounter: Payer: Medicaid Other | Admitting: Internal Medicine

## 2021-07-22 ENCOUNTER — Encounter: Payer: Self-pay | Admitting: *Deleted

## 2021-07-24 ENCOUNTER — Other Ambulatory Visit: Payer: Self-pay | Admitting: *Deleted

## 2021-07-24 ENCOUNTER — Other Ambulatory Visit: Payer: Self-pay | Admitting: Internal Medicine

## 2021-07-24 ENCOUNTER — Other Ambulatory Visit (HOSPITAL_COMMUNITY): Payer: Self-pay

## 2021-07-25 ENCOUNTER — Other Ambulatory Visit (HOSPITAL_COMMUNITY): Payer: Self-pay

## 2021-07-25 ENCOUNTER — Other Ambulatory Visit: Payer: Self-pay | Admitting: Internal Medicine

## 2021-07-25 DIAGNOSIS — E1165 Type 2 diabetes mellitus with hyperglycemia: Secondary | ICD-10-CM

## 2021-07-25 NOTE — Telephone Encounter (Signed)
Can you please let me know why the change?  Last visit with MD said 28 units BID.  Thanks

## 2021-07-26 ENCOUNTER — Other Ambulatory Visit (HOSPITAL_COMMUNITY): Payer: Self-pay

## 2021-07-26 ENCOUNTER — Other Ambulatory Visit: Payer: Self-pay

## 2021-07-26 MED ORDER — INSULIN DETEMIR 100 UNIT/ML FLEXPEN
28.0000 [IU] | PEN_INJECTOR | Freq: Two times a day (BID) | SUBCUTANEOUS | 2 refills | Status: DC
  Filled 2021-07-26: qty 45, 80d supply, fill #0

## 2021-07-26 MED ORDER — TRULICITY 1.5 MG/0.5ML ~~LOC~~ SOAJ
1.5000 mg | SUBCUTANEOUS | 3 refills | Status: DC
  Filled 2021-07-26: qty 6, 84d supply, fill #0

## 2021-07-26 MED ORDER — METFORMIN HCL 1000 MG PO TABS
1000.0000 mg | ORAL_TABLET | Freq: Two times a day (BID) | ORAL | 2 refills | Status: DC
  Filled 2021-07-26: qty 60, 30d supply, fill #0

## 2021-07-28 ENCOUNTER — Telehealth: Payer: Self-pay | Admitting: Student

## 2021-07-28 NOTE — Telephone Encounter (Signed)
..   Medicaid Managed Care   Unsuccessful Outreach Note  07/28/2021 Name: Aidenjames Heckmann MRN: [id number] DOB: 1965/10/31  Referred by: Nani Gasser, MD Reason for referral : High Risk Managed Medicaid (I called the patient to reschedule his phone appt with the MM RNCM. He did not answer the phone and the VM was full.)   A second unsuccessful telephone outreach was attempted today. The patient was referred to the case management team for assistance with care management and care coordination.   Follow Up Plan: The care management team will reach out to the patient again over the next 7 days.   [place]

## 2021-08-04 ENCOUNTER — Telehealth: Payer: Self-pay | Admitting: Student

## 2021-08-04 NOTE — Telephone Encounter (Signed)
..   Medicaid Managed Care   Unsuccessful Outreach Note  08/04/2021 Name: [name] MRN: [id number] DOB: 09-28-1965  Referred by: Nani Gasser, MD Reason for referral : High Risk Managed Medicaid (I called the patient today to get him rescheduled with the MM RNCM. He did not answer and his VM was full.)   Third unsuccessful telephone outreach was attempted today. The patient was referred to the case management team for assistance with care management and care coordination. The patient's primary care provider has been notified of our unsuccessful attempts to make or maintain contact with the patient. The care management team is pleased to engage with this patient at any time in the future should he/she be interested in assistance from the care management team.   Follow Up Plan: We have been unable to make contact with the patient for follow up. The care management team is available to follow up with the patient after provider conversation with the patient regarding recommendation for care management engagement and subsequent re-referral to the care management team.    [place], [place]

## 2021-10-18 ENCOUNTER — Encounter: Payer: Medicaid Other | Admitting: Internal Medicine

## 2021-10-30 ENCOUNTER — Other Ambulatory Visit (HOSPITAL_COMMUNITY): Payer: Self-pay

## 2021-11-14 ENCOUNTER — Other Ambulatory Visit (HOSPITAL_COMMUNITY): Payer: Self-pay

## 2021-11-14 MED ORDER — PRO-STAT AWC PO LIQD: Freq: Three times a day (TID) | ORAL | 2 refills | Status: AC

## 2022-01-01 ENCOUNTER — Other Ambulatory Visit (HOSPITAL_COMMUNITY): Payer: Self-pay

## 2022-01-01 ENCOUNTER — Ambulatory Visit (INDEPENDENT_AMBULATORY_CARE_PROVIDER_SITE_OTHER): Payer: Medicaid Other | Admitting: Student

## 2022-01-01 VITALS — BP 139/81 | HR 7 | Wt 211.4 lb

## 2022-01-01 DIAGNOSIS — Z89439 Acquired absence of unspecified foot: Secondary | ICD-10-CM | POA: Diagnosis not present

## 2022-01-01 DIAGNOSIS — Z794 Long term (current) use of insulin: Secondary | ICD-10-CM | POA: Diagnosis not present

## 2022-01-01 DIAGNOSIS — N179 Acute kidney failure, unspecified: Secondary | ICD-10-CM | POA: Diagnosis not present

## 2022-01-01 DIAGNOSIS — I10 Essential (primary) hypertension: Secondary | ICD-10-CM

## 2022-01-01 DIAGNOSIS — Z7984 Long term (current) use of oral hypoglycemic drugs: Secondary | ICD-10-CM | POA: Diagnosis not present

## 2022-01-01 DIAGNOSIS — D500 Iron deficiency anemia secondary to blood loss (chronic): Secondary | ICD-10-CM | POA: Diagnosis not present

## 2022-01-01 DIAGNOSIS — Z89431 Acquired absence of right foot: Secondary | ICD-10-CM | POA: Diagnosis not present

## 2022-01-01 DIAGNOSIS — Z23 Encounter for immunization: Secondary | ICD-10-CM | POA: Diagnosis not present

## 2022-01-01 DIAGNOSIS — E785 Hyperlipidemia, unspecified: Secondary | ICD-10-CM | POA: Diagnosis not present

## 2022-01-01 DIAGNOSIS — Z0000 Encounter for general adult medical examination without abnormal findings: Secondary | ICD-10-CM

## 2022-01-01 DIAGNOSIS — E1169 Type 2 diabetes mellitus with other specified complication: Secondary | ICD-10-CM

## 2022-01-01 DIAGNOSIS — E1165 Type 2 diabetes mellitus with hyperglycemia: Secondary | ICD-10-CM | POA: Diagnosis present

## 2022-01-01 LAB — POCT GLYCOSYLATED HEMOGLOBIN (HGB A1C): Hemoglobin A1C: 10.8 % — AB (ref 4.0–5.6)

## 2022-01-01 LAB — GLUCOSE, CAPILLARY: Glucose-Capillary: 393 mg/dL — ABNORMAL HIGH (ref 70–99)

## 2022-01-01 MED ORDER — ATORVASTATIN CALCIUM 40 MG PO TABS
40.0000 mg | ORAL_TABLET | Freq: Every day | ORAL | 3 refills | Status: DC
  Filled 2022-01-01: qty 90, 90d supply, fill #0

## 2022-01-01 MED ORDER — TRULICITY 1.5 MG/0.5ML ~~LOC~~ SOAJ
1.5000 mg | SUBCUTANEOUS | 3 refills | Status: DC
  Filled 2022-01-01: qty 6, 84d supply, fill #0

## 2022-01-01 MED ORDER — ACCU-CHEK GUIDE W/DEVICE KIT
1.0000 | PACK | Freq: Three times a day (TID) | 0 refills | Status: DC
  Filled 2022-01-01: qty 1, 1d supply, fill #0

## 2022-01-01 MED ORDER — METFORMIN HCL 1000 MG PO TABS
1000.0000 mg | ORAL_TABLET | Freq: Two times a day (BID) | ORAL | 2 refills | Status: DC
  Filled 2022-01-01: qty 60, 30d supply, fill #0

## 2022-01-01 MED ORDER — METFORMIN HCL 1000 MG PO TABS
1000.0000 mg | ORAL_TABLET | Freq: Two times a day (BID) | ORAL | 3 refills | Status: DC
  Filled 2022-01-01: qty 180, 90d supply, fill #0

## 2022-01-01 MED ORDER — INSULIN PEN NEEDLE 31G X 5 MM MISC
1.0000 [IU] | 11 refills | Status: DC
  Filled 2022-01-01: qty 100, 30d supply, fill #0

## 2022-01-01 MED ORDER — ACCU-CHEK GUIDE VI STRP
ORAL_STRIP | 3 refills | Status: DC
  Filled 2022-01-01: qty 350, 87d supply, fill #0

## 2022-01-01 MED ORDER — INSULIN DETEMIR 100 UNIT/ML FLEXPEN
30.0000 [IU] | PEN_INJECTOR | Freq: Two times a day (BID) | SUBCUTANEOUS | 3 refills | Status: DC
  Filled 2022-01-01 – 2022-01-02 (×3): qty 54, 90d supply, fill #0

## 2022-01-01 MED ORDER — AMLODIPINE BESYLATE 5 MG PO TABS
5.0000 mg | ORAL_TABLET | Freq: Every day | ORAL | 11 refills | Status: DC
  Filled 2022-01-01: qty 30, 30d supply, fill #0

## 2022-01-01 MED ORDER — INSULIN DETEMIR 100 UNIT/ML FLEXPEN
30.0000 [IU] | PEN_INJECTOR | Freq: Two times a day (BID) | SUBCUTANEOUS | 2 refills | Status: DC
  Filled 2022-01-01: qty 45, 75d supply, fill #0

## 2022-01-01 MED ORDER — AMLODIPINE BESYLATE 5 MG PO TABS
5.0000 mg | ORAL_TABLET | Freq: Every day | ORAL | 3 refills | Status: DC
  Filled 2022-01-01 – 2022-01-09 (×2): qty 90, 90d supply, fill #0

## 2022-01-01 NOTE — Patient Instructions (Addendum)
It was a pleasure seeing you in clinic  Please continue your current medications  I have made referral to the eye doctor, wound care, and diabetes educator  Please check you blood sugar daily before eating and if you feel it is low. Brind you meter to your next appointment  We will check labs and I will call with results   I have refilled you medications  Please follow up in 4 weeks  ??? ?? ????? ????? ????? ?? ???????  ???? ?????? ??????? ??????? ?????? ??  ??? ??? ???????? ??? ???? ?????? ???????? ??????? ????? ??? ??????  ???? ??? ???? ????? ?? ???? ????? ??? ????? ?????? ???? ???? ????????. ???? ?? ???? ?????? ??? ????? ??????  ??? ???? ???? ????????? ?????? ????????  ??? ??? ?????? ??? ??????? ??  ???? ???????? ???? 4 ??????

## 2022-01-02 ENCOUNTER — Encounter: Payer: Self-pay | Admitting: Student

## 2022-01-02 ENCOUNTER — Other Ambulatory Visit (HOSPITAL_COMMUNITY): Payer: Self-pay

## 2022-01-02 DIAGNOSIS — N179 Acute kidney failure, unspecified: Secondary | ICD-10-CM | POA: Insufficient documentation

## 2022-01-02 LAB — BMP8+ANION GAP
Anion Gap: 16 mmol/L (ref 10.0–18.0)
BUN/Creatinine Ratio: 25 — ABNORMAL HIGH (ref 9–20)
BUN: 30 mg/dL — ABNORMAL HIGH (ref 6–24)
CO2: 18 mmol/L — ABNORMAL LOW (ref 20–29)
Calcium: 9.2 mg/dL (ref 8.7–10.2)
Chloride: 99 mmol/L (ref 96–106)
Creatinine, Ser: 1.18 mg/dL (ref 0.76–1.27)
Glucose: 401 mg/dL — ABNORMAL HIGH (ref 70–99)
Potassium: 4.8 mmol/L (ref 3.5–5.2)
Sodium: 133 mmol/L — ABNORMAL LOW (ref 134–144)
eGFR: 72 mL/min/{1.73_m2} (ref >59)

## 2022-01-02 LAB — CBC
Hematocrit: 38 % (ref 37.5–51.0)
Hemoglobin: 12.5 g/dL — ABNORMAL LOW (ref 13.0–17.7)
MCH: 25.2 pg — ABNORMAL LOW (ref 26.6–33.0)
MCHC: 32.9 g/dL (ref 31.5–35.7)
MCV: 77 fL — ABNORMAL LOW (ref 79–97)
Platelets: 261 10*3/uL (ref 150–450)
RBC: 4.96 x10E6/uL (ref 4.14–5.80)
RDW: 15.4 % (ref 11.6–15.4)
WBC: 8.5 10*3/uL (ref 3.4–10.8)

## 2022-01-02 LAB — MICROALBUMIN / CREATININE URINE RATIO
Creatinine, Urine: 39.4 mg/dL
Microalb/Creat Ratio: 146 mg/g creat — ABNORMAL HIGH (ref 0–29)
Microalbumin, Urine: 57.7 ug/mL

## 2022-01-02 LAB — LIPID PANEL
Chol/HDL Ratio: 3.1 ratio (ref 0.0–5.0)
Cholesterol, Total: 137 mg/dL (ref 100–199)
HDL: 44 mg/dL (ref >39)
LDL Chol Calc (NIH): 78 mg/dL (ref 0–99)
Triglycerides: 78 mg/dL (ref 0–149)
VLDL Cholesterol Cal: 15 mg/dL (ref 5–40)

## 2022-01-02 MED ORDER — INSULIN LISPRO 100 UNIT/ML IJ SOLN
3.0000 [IU] | Freq: Three times a day (TID) | INTRAMUSCULAR | 3 refills | Status: DC
  Filled 2022-01-02: qty 10, 90d supply, fill #0

## 2022-01-02 NOTE — Assessment & Plan Note (Addendum)
A1c in October >15 at outside hospital during admission for L foot osteomyelitis. Levemir was increased to 30 units twice daily. Also on metforming '1000mg'$  twice daily and trulicity 1.5 mg weekly. Reports compliance with this while at SNF. Returned home 3 days ago. Reports glucoses at facility very labile between 40s and 300. Does have symptoms of shakiness with low. No symptoms since getting home. States is meter is very old and need a new one. Wife is concerned about him having low. Last episode was about 1 week ago while at [facility]. He reports good appetite and regular meals since he has been at home.   -Continue levemir 30 units twice daily -continue trulicity and metformin -Check fast CBGs daily and when having symptoms of lows, new meter ordered with supplies -Referral to diabetic educator -Referral to opthalmology

## 2022-01-02 NOTE — Assessment & Plan Note (Addendum)
Admission osteomyelitis of the right foot at [facility] between 10/29/2021 and 12/15/2021. Underwent R transmetatarsal revision on 11/01/2021. 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 Had follow up with podiatry on 11/27. Remains NWB. Doing Dat[place] dressings 3 times a week at home. Need supplies. No exam today no signs of over infection. Appears to be healing well. L foot with stage 1 ulcer without eschar or signs of infection. Insurance will not cover home health. Previously followed with wound care at [place].  -referral to wound care clinic given multiple amputations and at risk feet with poorly controlled diabetes.  -DME ordered for wound care supplies -has podiatry follow up in 2 weeks

## 2022-01-02 NOTE — Assessment & Plan Note (Signed)
Elevated Cr during admission in October for osteomyelitis. Cr. 1.29 on discharge. Repeat BMP today.

## 2022-01-02 NOTE — Progress Notes (Signed)
Established Patient Office Visit  Subjective   Patient ID: [name], male    DOB: 12-Sep-1965  Age: 56 y.o. MRN: [id number]  Chief Complaint  Patient presents with   Diabetes   Flu Vaccine    [name] is a 56 y.o. person living with a history listed below who presents to clinic for hospital follow up of right foot osteomyelitis s/p transmetatarsal revision.  Please refer to problem based charting for further details and assessment and plan of current problem and chronic medical conditions.     Patient Active Problem List   Diagnosis Date Noted   AKI (acute kidney injury) ([place]) 01/02/2022   History of transmetatarsal amputation of foot ([place]) 05/02/2021   Essential hypertension 03/10/2018   Neuropathy due to type 2 diabetes mellitus ([place]) 02/14/2016   Healthcare maintenance 12/25/2011   Type 2 diabetes mellitus with hyperglycemia ([place]) 12/06/2005   Hyperlipidemia associated with type 2 diabetes mellitus ([place]) 12/06/2005      Review of Systems  Constitutional:  Negative for chills and fever.  Gastrointestinal:  Negative for abdominal pain, diarrhea and vomiting.  Genitourinary:  Positive for frequency. Negative for dysuria and urgency.  Neurological:  Negative for sensory change and weakness.      Objective:     BP 139/81 (BP Location: Right Arm, Patient Position: Sitting, Cuff Size: Normal)   Pulse (!) 7   Wt 211 lb 6.4 oz (95.9 kg)   SpO2 99%   BMI 27.14 kg/m  BP Readings from Last 3 Encounters:  01/01/22 139/81  05/16/21 137/69  05/02/21 136/77      Physical Exam Constitutional:      General: He is not in acute distress.    Appearance: Normal appearance. He is not toxic-appearing.  HENT:     Mouth/Throat:     Mouth: Mucous membranes are moist.     Pharynx: Oropharynx is clear.  Eyes:     Conjunctiva/sclera: Conjunctivae normal.     Pupils: Pupils are equal, round, and reactive to light.  Cardiovascular:     Rate and Rhythm: Normal rate and regular  rhythm.     Pulses: Normal pulses.     Heart sounds: No murmur heard. Pulmonary:     Effort: Pulmonary effort is normal.     Breath sounds: No rhonchi or rales.  Abdominal:     General: Abdomen is flat. Bowel sounds are normal. There is no distension.     Palpations: Abdomen is soft.     Tenderness: There is no abdominal tenderness.  Musculoskeletal:        General: Normal range of motion.     Right lower leg: No edema.  Skin:    Capillary Refill: Capillary refill takes less than 2 seconds.     Comments: 3 cm ulceration of the right foot at amputation stump with mild serosanguinous drainage. Small amount if devitalized tissue surrounding the wound, no erythema or swelling. Stage 1 ulceration of the stump of the left foot at prior amputation site no overt signs of infection. Dry skin of the heels  Neurological:     General: No focal deficit present.     Mental Status: He is alert and oriented to person, place, and time. Mental status is at baseline.  Psychiatric:        Mood and Affect: Mood normal.        Behavior: Behavior normal.      Results for orders placed or performed in visit on 01/01/22  Lipid Profile  Result Value Ref Range   Cholesterol, Total 137 100 - 199 mg/dL   Triglycerides 78 0 - 149 mg/dL   HDL 44 >39 mg/dL   VLDL Cholesterol Cal 15 5 - 40 mg/dL   LDL Chol Calc (NIH) 78 0 - 99 mg/dL   Chol/HDL Ratio 3.1 0.0 - 5.0 ratio  BMP8+Anion Gap  Result Value Ref Range   Glucose 401 (H) 70 - 99 mg/dL   BUN 30 (H) 6 - 24 mg/dL   Creatinine, Ser 1.18 0.76 - 1.27 mg/dL   eGFR 72 >59 mL/min/1.73   BUN/Creatinine Ratio 25 (H) 9 - 20   Sodium 133 (L) 134 - 144 mmol/L   Potassium 4.8 3.5 - 5.2 mmol/L   Chloride 99 96 - 106 mmol/L   CO2 18 (L) 20 - 29 mmol/L   Anion Gap 16.0 10.0 - 18.0 mmol/L   Calcium 9.2 8.7 - 10.2 mg/dL  CBC no Diff  Result Value Ref Range   WBC 8.5 3.4 - 10.8 x10E3/uL   RBC 4.96 4.14 - 5.80 x10E6/uL   Hemoglobin 12.5 (L) 13.0 - 17.7 g/dL    Hematocrit 38.0 37.5 - 51.0 %   MCV 77 (L) 79 - 97 fL   MCH 25.2 (L) 26.6 - 33.0 pg   MCHC 32.9 31.5 - 35.7 g/dL   RDW 15.4 11.6 - 15.4 %   Platelets 261 150 - 450 x10E3/uL  Glucose, capillary  Result Value Ref Range   Glucose-Capillary 393 (H) 70 - 99 mg/dL  POC Hbg A1C  Result Value Ref Range   Hemoglobin A1C 10.8 (A) 4.0 - 5.6 %   HbA1c POC (<> result, manual entry)     HbA1c, POC (prediabetic range)     HbA1c, POC (controlled diabetic range)      Last CBC Lab Results  Component Value Date   WBC 8.5 01/01/2022   HGB 12.5 (L) 01/01/2022   HCT 38.0 01/01/2022   MCV 77 (L) 01/01/2022   MCH 25.2 (L) 01/01/2022   RDW 15.4 01/01/2022   PLT 261 60/63/0160   Last metabolic panel Lab Results  Component Value Date   GLUCOSE 401 (H) 01/01/2022   NA 133 (L) 01/01/2022   K 4.8 01/01/2022   CL 99 01/01/2022   CO2 18 (L) 01/01/2022   BUN 30 (H) 01/01/2022   CREATININE 1.18 01/01/2022   EGFR 72 01/01/2022   CALCIUM 9.2 01/01/2022   PROT 7.5 07/31/2016   ALBUMIN 4.3 07/31/2016   LABGLOB 3.2 07/31/2016   AGRATIO 1.3 07/31/2016   BILITOT <0.2 07/31/2016   ALKPHOS 94 07/31/2016   AST 13 07/31/2016   ALT 15 07/31/2016   ANIONGAP 11 02/07/2021   Last lipids Lab Results  Component Value Date   CHOL 137 01/01/2022   HDL 44 01/01/2022   LDLCALC 78 01/01/2022   TRIG 78 01/01/2022   CHOLHDL 3.1 01/01/2022   Last hemoglobin A1c Lab Results  Component Value Date   HGBA1C 10.8 (A) 01/01/2022      The 10-year ASCVD risk score (Arnett DK, et al., 2019) is: 23.1%    Assessment & Plan:   Problem List Items Addressed This Visit       Cardiovascular and Mediastinum   Essential hypertension    BP 157/85 and improved to 139/81 today. On amlodipine 5 mg daily. He did not take his BP medications this morning. Asked him to keep a BP log and take is antihypertensives prior to his next visit.  Relevant Medications   amLODipine (NORVASC) 5 MG tablet   atorvastatin  (LIPITOR) 40 MG tablet     Endocrine   Type 2 diabetes mellitus with hyperglycemia (HCC) - Primary    A1c in October >15 at outside hospital during admission for L foot osteomyelitis. Levemir was increased to 30 units twice daily. Also on metforming 1051m twice daily and trulicity 1.5 mg weekly. Reports compliance with this while at SNF. Returned home 3 days ago. Reports glucoses at facility very labile between 40s and 300. Does have symptoms of shakiness with low. No symptoms since getting home. States is meter is very old   -Continue levemir 30 units twice daily -continue trulicity and metformin -Check fast CBGs daily and when having symptoms of lows, new meter ordered with supplies -Referral to diabetic educator -Referral to opthalmology       Relevant Medications   Blood Glucose Monitoring Suppl (ACCU-CHEK GUIDE) w/Device KIT   glucose blood (ACCU-CHEK GUIDE) test strip   Insulin Pen Needle 31G X 5 MM MISC   atorvastatin (LIPITOR) 40 MG tablet   Dulaglutide (TRULICITY) 1.5 MTD/3.2KGSOPN   insulin detemir (LEVEMIR) 100 UNIT/ML FlexPen   metFORMIN (GLUCOPHAGE) 1000 MG tablet   insulin lispro (HUMALOG) 100 UNIT/ML injection   Other Relevant Orders   POC Hbg A1C (Completed)   Hyperlipidemia associated with type 2 diabetes mellitus (HCC)   Relevant Medications   amLODipine (NORVASC) 5 MG tablet   atorvastatin (LIPITOR) 40 MG tablet   Dulaglutide (TRULICITY) 1.5 MUR/4.2HCSOPN   insulin detemir (LEVEMIR) 100 UNIT/ML FlexPen   metFORMIN (GLUCOPHAGE) 1000 MG tablet   insulin lispro (HUMALOG) 100 UNIT/ML injection   Other Relevant Orders   Lipid Profile (Completed)     Genitourinary   AKI (acute kidney injury) (HCC)    Elevated Cr during admission in October for osteomyelitis. Cr. 1.29 on discharge. Repeat BMP today.         Other   History of transmetatarsal [place] of foot [place]    [facility]  Underwent R transmetatarsal revision on 11/01/2021. 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 Had follow up with podiatry.       Other Visit Diagnoses     Uncontrolled type 2 diabetes mellitus with hyperglycemia (HCC)       Relevant Medications   Blood Glucose Monitoring Suppl (ACCU-CHEK GUIDE) w/Device KIT   glucose blood (ACCU-CHEK GUIDE) test strip   Insulin Pen Needle 31G X 5 MM MISC   atorvastatin (LIPITOR) 40 MG tablet   Dulaglutide (TRULICITY) 1.5 MWC/3.7SESOPN   insulin detemir (LEVEMIR) 100 UNIT/ML FlexPen   metFORMIN (GLUCOPHAGE) 1000 MG tablet   insulin lispro (HUMALOG) 100 UNIT/ML injection   Other Relevant Orders   Microalbumin / Creatinine Urine Ratio   BMP8+Anion Gap (Completed)   Ambulatory referral to Ophthalmology   Ambulatory referral to diabetic education   AMB referral to wound care center   Iron deficiency anemia due to chronic blood loss       Relevant Orders   CBC no Diff (Completed)   [place] of [place]arsal [place] of right foot [place]       Relevant Orders   For home use only DME Other see comment   Need for immunization against influenza       Relevant Orders   Flu Vaccine QUAD 660moM (Fluarix, Fluzone & Alfiuria Quad PF) (Completed)  Need for Tdap vaccination           Return in about 4 weeks (around 01/29/2022).    Iona Beard, MD

## 2022-01-02 NOTE — Assessment & Plan Note (Signed)
BP 157/85 and improved to 139/81 today. On amlodipine 5 mg daily. He did not take his BP medications this morning. Asked him to keep a BP log and take is antihypertensives prior to his next visit.

## 2022-01-02 NOTE — Assessment & Plan Note (Signed)
Tdap updated today.

## 2022-01-02 NOTE — Assessment & Plan Note (Signed)
Repeat lipid panel today. Refilled atorvastatin 40 mg daily.

## 2022-01-04 NOTE — Progress Notes (Signed)
Internal Medicine Clinic Attending  Case discussed with Dr. Lisabeth Devoid  at the time of the visit.  We reviewed the resident's history and exam and pertinent patient test results.  I agree with the assessment, diagnosis, and plan of care documented in the resident's note.

## 2022-01-08 ENCOUNTER — Encounter: Payer: Self-pay | Admitting: Student

## 2022-01-08 ENCOUNTER — Ambulatory Visit: Payer: Medicaid Other | Admitting: Internal Medicine

## 2022-01-08 VITALS — BP 159/93 | HR 88 | Temp 98.5°F

## 2022-01-08 DIAGNOSIS — Z89439 Acquired absence of unspecified foot: Secondary | ICD-10-CM

## 2022-01-08 DIAGNOSIS — I10 Essential (primary) hypertension: Secondary | ICD-10-CM

## 2022-01-08 MED ORDER — OLMESARTAN MEDOXOMIL 20 MG PO TABS: 20.0000 mg | ORAL_TABLET | Freq: Every day | ORAL | 1 refills | Status: DC

## 2022-01-08 NOTE — Progress Notes (Deleted)
Last visit 01/01/2022  HTN- amlo 5 T2DM- A1c 13.5-10.8, levemir 30 units bid, trulicity, metformin,  HLD- ldl 78  Podiatry 01/31/2022

## 2022-01-08 NOTE — Patient Instructions (Addendum)
????? ??????  ??? ?? ?????? ????? ?? ??????? ?????.  ????? ??? ?? ??????? ?????:    1. ????? - ???? ??? ???? ???? ??????? ??????? 2. ??????? ??????? ???? ???????? ?? ????? - ????? ?????? ???? ????? ??? ????? ????? ???? ??? ????. ????? ?????? ??? ???????? ???? 4 ??????   ???? ?????? ????????? ??????? ??? ??????? ?????? ??: ???? ?????? ??????????? 20 ??? ??????  ??? ??? ????? ????? ?? ???? ????? ?? ?????? ????? ?? ??? ?????? ??? ??? ???? ????? ?? ???????  ???? ???????? ?????  ??? ??? ????? ??? ????? ??????? ????? ????? ???????? ??? ????? ???? ????   _________________   [name],  It was great to see you in clinic today.  Below is what we discussed today:  For your foot - we are working to ensure an appointment to wound care For your elevated protein in urine - we will be starting a medication that will help protect your kidney and lower your blood pressure. We will re-check your labs in 4 weeks   Please note the following changes to your medications: Start olmesartan 20 mg daily  It is a pleasure to be a part of your team, thank you for allowing [place] to be a part of your care,  [name] and Dr. Raymondo Hernandez  If you need medication refills please notify your pharmacy one week in advance and they will send [place] a request.

## 2022-01-08 NOTE — Assessment & Plan Note (Addendum)
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 Microalbumin/creatine ratio obtained at that visit moderately increased at 146 mg/g. Will start patient on ARB and re-check BMP in 4 weeks. Can consider addition of SGLT-2 inhibitor if microalbumin/cr ratio still found to be elevated in the future.  Plan -Start olmesartan 20 mg daily -F/u in 4 weeks to re-check BMP -Continue Levemir 30 units twice daily, continue Humalog 3 units with meals -Continue metformin 1000 mg BID, dulaglutide 1.5 mg injection weekly -Continue with checking fasting CBGs daily and when having symptoms of lows -Consider addition of SGLT-2 inhibitor if microalbumin/cr ratio still elevated in the future.

## 2022-01-08 NOTE — Assessment & Plan Note (Addendum)
BP elevated at 159/93 today. He denies headaches, dizziness, acute changes in vision, chest pain, or chest pressure.  Plan -Start olmesartan 20 mg  -Continue amlodipine 5 mg daily -Re-check BMP in 4 weeks

## 2022-01-08 NOTE — Progress Notes (Signed)
Subjective:   Patient ID: [name] male   DOB: 09-25-1965 56 y.o.   MRN: [id number]  HPI: [name] is a 55 y.o. 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 Please see problem-based assessment and plan charting for further details.  Review of Systems: Pertinent items are noted in HPI of problem-based assessment and plan.  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 ([place]) 08/26/2018   Hyperlipidemia    Positive FIT (fecal immunochemical test) 08/02/2016   Preventative health care 12/25/2011    Patient Active Problem List   Diagnosis Date Noted   AKI (acute kidney injury) ([place]) 01/02/2022   History of transmetatarsal amputation of foot ([place]) 05/02/2021   Essential hypertension 03/10/2018   Neuropathy due to type 2 diabetes mellitus ([place]) 02/14/2016   Healthcare maintenance 12/25/2011   Type 2 diabetes mellitus with hyperglycemia ([place]) 12/06/2005   Hyperlipidemia associated with type 2 diabetes mellitus ([place]) 12/06/2005     Current Outpatient Medications  Medication Sig Dispense Refill   Accu-Chek Softclix Lancets lancets use up to 4 times daily 300 each 3   Amino Acids-Protein Hydrolys (PRO-STAT AWC) LIQD Take 1 packet by mouth 3 (three) times daily after meals. 90 mL 2   amLODipine (NORVASC) 5 MG tablet Take 1 tablet (5 mg total) by mouth daily. 90 tablet 3   atorvastatin (LIPITOR) 40 MG tablet Take 1 tablet (40 mg total) by mouth daily. 90 tablet 3   Blood Glucose Monitoring Suppl (ACCU-CHEK GUIDE) w/Device KIT Use 4 (four) times daily -  before meals and at bedtime. 1 kit 0   Continuous Blood Gluc Receiver (FREESTYLE LIBRE 2 READER) DEVI Check blood sugar at least 8 times a  day, when you wake up, before and after eating and at bedtime and any other time you feel unusual or want to check it. aifhas nisbat alsukar fi aldam 8 maraat ealaa al'aqali ywmyan , eind alaistiqaz waqabl al'akl wabaedah waeind alnawm wa'ayu waqt akhir tasheur fih 'anak ghayr muetad 'aw turid altahaquq minhu. 1 each 0   Continuous Blood Gluc Sensor (FREESTYLE LIBRE 2 SENSOR) MISC Check blood sugar at least 8 times a day, when you wake up, before and after eating and at bedtime and any other time you feel unusual or want to check it. aifhas nisbat alsukar fi aldam 8 maraat ealaa al'aqali ywmyan , eind alaistiqaz waqabl al'akl wabaedah waeind alnawm wa'ayu waqt akhir tasheur fih 'anak ghayr muetad 'aw turid altahaquq minhu. 6 each 3   Dulaglutide (TRULICITY) 1.5 DD/2.2GU SOPN Inject 1.5 mg into the skin once a week. 6 mL 3   glucose blood (ACCU-CHEK GUIDE) test strip Check blood sugar 4 times per day 350 each 3   insulin detemir (LEVEMIR) 100 UNIT/ML FlexPen Inject 30 Units into the skin 2 (two) times daily. 54 mL 3   insulin lispro (HUMALOG) 100 UNIT/ML injection Inject 3 Units  into the skin 3 (three) times daily with meals. 10 mL 3   Insulin Pen Needle 31G X 5 MM MISC Use as directed 100 each 11   metFORMIN (GLUCOPHAGE) 1000 MG tablet Take 1 tablet (1,000 mg total) by mouth 2 (two) times daily with a  meal. 180 tablet 3   No current facility-administered medications for this visit.     Objective:   Physical Exam: Vitals:   01/08/22 1408  BP: (!) 159/93  Pulse: 88  Temp: 98.5 F (36.9 C)  TempSrc: Oral  SpO2: 100%   Constitutional: sitting in wheelchair, in no acute distress Cardiovascular: regular rate with normal rhythm, no murmurs Pulmonary/Chest: normal work of breathing on room air, lungs clear to auscultation bilaterally Abdominal: soft, non-tender, non-distended, bowel sounds present MSK: no lower extremity edema. 3 cm ulceration of right foot at amputation stump with mild  serosanguinous drainage. No erythema or swelling. Left foot stump from prior amputation site closed with no signs of infection. Skin: warm and dry. Neurological: alert and answering questions appropriately. 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 Microalbumin/creatine ratio obtained at that visit moderately increased at 146 mg/g. Will start patient on ARB and re-check BMP in 4 weeks. Can consider addition of SGLT-2 inhibitor if microalbumin/cr ratio still found to be elevated in the future.  Plan -Start olmesartan 20 mg daily -F/u in 4 weeks to re-check BMP -Continue Levemir 30 units twice daily, continue Humalog 3 units with meals -Continue metformin 1000 mg BID, dulaglutide 1.5 mg injection weekly -Continue with checking fasting CBGs daily and when having symptoms of lows -Consider addition of SGLT-2 inhibitor if microalbumin/cr ratio still elevated in the future.   Essential hypertension BP elevated at 159/93 today. He denies headaches, dizziness, acute changes in vision, chest pain, or chest pressure.  Plan -Start olmesartan 20 mg  -Continue amlodipine 5 mg daily -Re-check BMP in 4 weeks  History of transmetatarsal amputation of foot ([place]) Patient has been doing dressing changes 3 times a week at home with no reported issues. Reports occasional blood spotting with peeling of prior dressing. Exam of right foot amputation stump largely unchanged from last week with no signs of infection and healing well. Left foot stump from prior amputation site closed with no signs of infection. Ensured patient has appointment slot with wound care at clinic visit today.  Plan -Appointment with [place] [facility] on 02/02/22 -Continue with regular dressing changes  -Follow-up  appointment with [facility] on 01/31/22  Patient discussed with Dr. Lowell Guitar (Max) [place], MS3 01/08/2022, 4:15 PM

## 2022-01-08 NOTE — Assessment & Plan Note (Addendum)
Patient has been doing dressing changes 3 times a week at home with no reported issues. Reports occasional blood spotting with peeling of prior dressing. Exam of right foot amputation stump largely unchanged from last week with no signs of infection and healing well. Left foot stump from prior amputation site closed with no signs of infection. Ensured patient has appointment slot with wound care at clinic visit today.  Plan -Appointment with [facility] [place] on 02/02/22 -Continue with regular dressing changes  -Follow-up appointment with [facility] on 01/31/22

## 2022-01-09 ENCOUNTER — Other Ambulatory Visit (HOSPITAL_COMMUNITY): Payer: Self-pay

## 2022-01-10 ENCOUNTER — Telehealth: Payer: Self-pay

## 2022-01-10 ENCOUNTER — Other Ambulatory Visit: Payer: Self-pay | Admitting: Internal Medicine

## 2022-01-10 ENCOUNTER — Other Ambulatory Visit (HOSPITAL_COMMUNITY): Payer: Self-pay

## 2022-01-10 MED ORDER — OLMESARTAN MEDOXOMIL 20 MG PO TABS
20.0000 mg | ORAL_TABLET | Freq: Every day | ORAL | 3 refills | Status: DC
  Filled 2022-01-10: qty 90, 90d supply, fill #0
  Filled 2022-01-10: qty 30, 30d supply, fill #0

## 2022-01-10 NOTE — Telephone Encounter (Signed)
Pt's daughter states the new medication is not at the pharmacy. Please call pt back.

## 2022-01-12 NOTE — Progress Notes (Signed)
Internal Medicine Clinic Attending ° °Case discussed with Dr. Atway  At the time of the visit.  We reviewed the resident’s history and exam and pertinent patient test results.  I agree with the assessment, diagnosis, and plan of care documented in the resident’s note.  °

## 2022-01-26 ENCOUNTER — Emergency Department (HOSPITAL_BASED_OUTPATIENT_CLINIC_OR_DEPARTMENT_OTHER): Admit: 2022-01-26 | Discharge: 2022-01-26 | Disposition: A | Discharge status: Home / Self Care | Payer: Medicaid Other

## 2022-01-26 ENCOUNTER — Emergency Department (HOSPITAL_COMMUNITY)
Admission: EM | Admit: 2022-01-26 | Discharge: 2022-01-27 | Disposition: A | Discharge status: Home / Self Care | Payer: Medicaid Other | Attending: Student | Admitting: Student

## 2022-01-26 ENCOUNTER — Emergency Department (HOSPITAL_COMMUNITY): Payer: Medicaid Other

## 2022-01-26 DIAGNOSIS — R600 Localized edema: Secondary | ICD-10-CM | POA: Insufficient documentation

## 2022-01-26 DIAGNOSIS — E1140 Type 2 diabetes mellitus with diabetic neuropathy, unspecified: Secondary | ICD-10-CM | POA: Diagnosis not present

## 2022-01-26 DIAGNOSIS — L929 Granulomatous disorder of the skin and subcutaneous tissue, unspecified: Secondary | ICD-10-CM

## 2022-01-26 DIAGNOSIS — Z7984 Long term (current) use of oral hypoglycemic drugs: Secondary | ICD-10-CM | POA: Insufficient documentation

## 2022-01-26 DIAGNOSIS — Z794 Long term (current) use of insulin: Secondary | ICD-10-CM | POA: Insufficient documentation

## 2022-01-26 DIAGNOSIS — Z79899 Other long term (current) drug therapy: Secondary | ICD-10-CM | POA: Diagnosis not present

## 2022-01-26 DIAGNOSIS — I129 Hypertensive chronic kidney disease with stage 1 through stage 4 chronic kidney disease, or unspecified chronic kidney disease: Secondary | ICD-10-CM | POA: Insufficient documentation

## 2022-01-26 DIAGNOSIS — R52 Pain, unspecified: Secondary | ICD-10-CM

## 2022-01-26 DIAGNOSIS — L928 Other granulomatous disorders of the skin and subcutaneous tissue: Secondary | ICD-10-CM | POA: Diagnosis not present

## 2022-01-26 DIAGNOSIS — E1165 Type 2 diabetes mellitus with hyperglycemia: Secondary | ICD-10-CM | POA: Insufficient documentation

## 2022-01-26 DIAGNOSIS — E875 Hyperkalemia: Secondary | ICD-10-CM

## 2022-01-26 DIAGNOSIS — E1122 Type 2 diabetes mellitus with diabetic chronic kidney disease: Secondary | ICD-10-CM | POA: Diagnosis not present

## 2022-01-26 DIAGNOSIS — R609 Edema, unspecified: Secondary | ICD-10-CM

## 2022-01-26 DIAGNOSIS — N189 Chronic kidney disease, unspecified: Secondary | ICD-10-CM | POA: Diagnosis not present

## 2022-01-26 DIAGNOSIS — M7989 Other specified soft tissue disorders: Secondary | ICD-10-CM | POA: Diagnosis present

## 2022-01-26 LAB — CBC WITH DIFFERENTIAL/PLATELET
Abs Immature Granulocytes: 0.02 10*3/uL (ref 0.00–0.07)
Basophils Absolute: 0 10*3/uL (ref 0.0–0.1)
Basophils Relative: 0 %
Eosinophils Absolute: 0.4 10*3/uL (ref 0.0–0.5)
Eosinophils Relative: 5 %
HCT: 34.9 % — ABNORMAL LOW (ref 39.0–52.0)
Hemoglobin: 11.6 g/dL — ABNORMAL LOW (ref 13.0–17.0)
Immature Granulocytes: 0 %
Lymphocytes Relative: 21 %
Lymphs Abs: 1.7 10*3/uL (ref 0.7–4.0)
MCH: 26.4 pg (ref 26.0–34.0)
MCHC: 33.2 g/dL (ref 30.0–36.0)
MCV: 79.3 fL — ABNORMAL LOW (ref 80.0–100.0)
Monocytes Absolute: 0.7 10*3/uL (ref 0.1–1.0)
Monocytes Relative: 9 %
Neutro Abs: 5.1 10*3/uL (ref 1.7–7.7)
Neutrophils Relative %: 65 %
Platelets: 254 10*3/uL (ref 150–400)
RBC: 4.4 MIL/uL (ref 4.22–5.81)
RDW: 14.7 % (ref 11.5–15.5)
WBC: 7.9 10*3/uL (ref 4.0–10.5)
nRBC: 0 % (ref 0.0–0.2)

## 2022-01-26 LAB — COMPREHENSIVE METABOLIC PANEL
ALT: 23 U/L (ref 0–44)
AST: 19 U/L (ref 15–41)
Albumin: 3.3 g/dL — ABNORMAL LOW (ref 3.5–5.0)
Alkaline Phosphatase: 94 U/L (ref 38–126)
Anion gap: 7 (ref 5–15)
BUN: 25 mg/dL — ABNORMAL HIGH (ref 6–20)
CO2: 21 mmol/L — ABNORMAL LOW (ref 22–32)
Calcium: 8.9 mg/dL (ref 8.9–10.3)
Chloride: 107 mmol/L (ref 98–111)
Creatinine, Ser: 2.04 mg/dL — ABNORMAL HIGH (ref 0.61–1.24)
GFR, Estimated: 37 mL/min — ABNORMAL LOW (ref >60)
Glucose, Bld: 252 mg/dL — ABNORMAL HIGH (ref 70–99)
Potassium: 5.8 mmol/L — ABNORMAL HIGH (ref 3.5–5.1)
Sodium: 135 mmol/L (ref 135–145)
Total Bilirubin: 0.1 mg/dL — ABNORMAL LOW (ref 0.3–1.2)
Total Protein: 7.9 g/dL (ref 6.5–8.1)

## 2022-01-26 LAB — LACTIC ACID, PLASMA: Lactic Acid, Venous: 1.9 mmol/L (ref 0.5–1.9)

## 2022-01-26 NOTE — ED Provider Triage Note (Signed)
Emergency Medicine Provider Triage Evaluation Note  [name] , a 56 y.o. male  was evaluated in triage.  Pt complains of bilateral lower leg pain. States he has several wounds on his legs and feet. Has had bilateral toe amputation with right foot revision in October. Wound noted to same that has had increased bloody drainage for the past few days. Patient also states his legs are more swollen than normal, just got back from plane trip to [place]. Denies fevers, chills, chest pain, shortness of breath.  Review of Systems  Positive:  Negative:   Physical Exam  BP (!) 154/84 (BP Location: Right Arm)   Pulse 87   Temp 98.2 F (36.8 C) (Oral)   Resp 17   Ht 6' (1.829 m)   Wt 95.3 kg   SpO2 98%   BMI 28.48 kg/m  Gen:   Awake, no distress   Resp:  Normal effort  MSK:   Moves extremities without difficulty  Other:  Open wound noted to the right foot without purulence. Wound noted to the left leg and foot. Trace edema noted bilaterally. No calf tenderness.  Medical Decision Making  Medically screening exam initiated at 6:48 PM.  Appropriate orders placed.  Deiondre Harrower was informed that the remainder of the evaluation will be completed by another provider, this initial triage assessment does not replace that evaluation, and the importance of remaining in the ED until their evaluation is complete.     Nestor Lewandowsky 01/26/22 [id number]

## 2022-01-26 NOTE — ED Triage Notes (Addendum)
Pt to ED via POV,  Pt has all toes amputated on both right and left feet.  Right foot- amputation surgery 3 years, revision surgery in October, wound noted to right foot, reports healing from October surgery, draining blood x 1 day- bleeding controlled. Left foot- amputation surgery 3 years ago, reports diabetic ulcer to left foot treated in October. Concerned for swelling x 3 days to feet/legs. Reports recent plane travel to [place]. Pt wife reports hx of infections to feet, and concerned for the same.

## 2022-01-26 NOTE — Progress Notes (Signed)
Bilateral lower extremity venous duplex has been completed. Preliminary results can be found in CV Proc through chart review.  Results were given to Lavonna Rua PA.  01/26/22 7:38 PM Carlos Levering RVT

## 2022-01-27 LAB — SEDIMENTATION RATE: Sed Rate: 58 mm/hr — ABNORMAL HIGH (ref 0–16)

## 2022-01-27 LAB — BRAIN NATRIURETIC PEPTIDE: B Natriuretic Peptide: 20.7 pg/mL (ref 0.0–100.0)

## 2022-01-27 LAB — C-REACTIVE PROTEIN: CRP: 2.5 mg/dL — ABNORMAL HIGH (ref <1.0)

## 2022-01-27 MED ORDER — FUROSEMIDE 20 MG PO TABS
40.0000 mg | ORAL_TABLET | Freq: Once | ORAL | Status: AC
  Administered 2022-01-27: 40 mg via ORAL
  Filled 2022-01-27: qty 2

## 2022-01-27 MED ORDER — FUROSEMIDE 20 MG PO TABS: 20.0000 mg | ORAL_TABLET | Freq: Two times a day (BID) | ORAL | 0 refills | Status: DC

## 2022-01-27 MED ORDER — MEDICAL COMPRESSION STOCKINGS MISC: 1.0000 | Freq: Every day | 0 refills | Status: AC

## 2022-01-27 MED ORDER — SODIUM ZIRCONIUM CYCLOSILICATE 10 G PO PACK
10.0000 g | PACK | Freq: Once | ORAL | Status: AC
  Administered 2022-01-27: 10 g via ORAL
  Filled 2022-01-27: qty 1

## 2022-01-27 NOTE — ED Provider Notes (Signed)
[place] EMERGENCY DEPARTMENT Provider Note  CSN: [id number] Arrival date & time: 01/26/22 1814  Chief Complaint(s) Foot Pain (Right, left) and Foot Swelling  HPI [name] is a 56 y.o. male with PMH diabetes, CKD, right osteomyelitis status post transmetatarsal revision on 12/4 who presents emergency department for evaluation of bilateral lower extremity swelling and foot pain.  Patient states that he has noticed more drainage from his amputation site and recently took a plane ride from [place].  He does not use any compression devices at home and states that over the last 3 days he has noticed worsening swelling and weeping from wounds on both lower extremities.  He denies shortness of breath or orthopnea.  Denies chest pain, fever, nausea, vomiting or other systemic symptoms.   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 ([place]) 08/26/2018   Hyperlipidemia    Positive FIT (fecal immunochemical test) 08/02/2016   Preventative health care 12/25/2011   Patient Active Problem List   Diagnosis Date Noted   AKI (acute kidney injury) ([place]) 01/02/2022   History of transmetatarsal amputation of foot ([place]) 05/02/2021   Essential hypertension 03/10/2018   Neuropathy due to type 2 diabetes mellitus ([place]) 02/14/2016   Healthcare maintenance 12/25/2011   Type 2 diabetes mellitus with hyperglycemia ([place]) 12/06/2005   Hyperlipidemia associated with type 2 diabetes mellitus ([place]) 12/06/2005   Home Medication(s) Prior to Admission medications   Medication Sig Start Date End Date Taking? Authorizing Provider  Elastic Bandages & Supports (MEDICAL COMPRESSION STOCKINGS) MISC 1 each by Does not apply route daily. 01/27/22  Yes Fraya Ueda, MD  furosemide (LASIX) 20 MG tablet Take 1 tablet (20 mg total) by mouth 2 (two) times daily for 7 days.  01/27/22 02/03/22 Yes Vaughn Beaumier, MD  Accu-Chek Softclix Lancets lancets use up to 4 times daily 10/06/20   Virl Axe, MD  Amino Acids-Protein Hydrolys (PRO-STAT [facility]) LIQD Take 1 packet by mouth 3 (three) times daily after meals. 11/14/21     amLODipine (NORVASC) 5 MG tablet Take 1 tablet (5 mg total) by mouth daily. 01/01/22 01/01/23  Iona Beard, MD  atorvastatin (LIPITOR) 40 MG tablet Take 1 tablet (40 mg total) by mouth daily. 01/01/22   Iona Beard, MD  Blood Glucose Monitoring Suppl (ACCU-CHEK GUIDE) w/Device KIT Use 4 (four) times daily -  before meals and at bedtime. 01/01/22   Iona Beard, MD  Continuous Blood Gluc Receiver (FREESTYLE LIBRE 2 READER) DEVI Check blood sugar at least 8 times a day, when you wake up, before and after eating and at bedtime and any other time you feel unusual or want to check it. aifhas nisbat alsukar fi aldam 8 maraat ealaa al'aqali ywmyan , eind alaistiqaz waqabl al'akl wabaedah waeind alnawm wa'ayu waqt akhir tasheur fih 'anak ghayr muetad 'aw turid altahaquq minhu. 12/21/20   Jose Persia, MD  Continuous Blood Gluc Sensor (FREESTYLE LIBRE 2 SENSOR) MISC Check blood sugar at least 8 times a day, when you wake up, before and after eating and at bedtime and any other time you feel unusual or want to check it. aifhas nisbat alsukar fi aldam 8 maraat ealaa al'aqali ywmyan , eind alaistiqaz waqabl al'akl wabaedah waeind alnawm wa'ayu waqt akhir tasheur fih 'anak ghayr muetad 'aw turid altahaquq minhu. 12/21/20   Jose Persia, MD  Dulaglutide (TRULICITY) 1.5 JK/9.3OI  SOPN Inject 1.5 mg into the skin once a week. 01/01/22 12/27/22  Iona Beard, MD  glucose blood (ACCU-CHEK GUIDE) test strip Check blood sugar 4 times per day 01/01/22   Iona Beard, MD  insulin detemir (LEVEMIR) 100 UNIT/ML FlexPen Inject 30 Units into the skin 2 (two) times daily. 01/01/22 12/27/22  Iona Beard, MD  insulin lispro (HUMALOG) 100 UNIT/ML injection Inject 3 Units  into  the skin 3 (three) times daily with meals. 01/02/22   Iona Beard, MD  Insulin Pen Needle 31G X 5 MM MISC Use as directed 01/01/22   Iona Beard, MD  metFORMIN (GLUCOPHAGE) 1000 MG tablet Take 1 tablet (1,000 mg total) by mouth 2 (two) times daily with a meal. 01/01/22 12/27/22  Iona Beard, MD  olmesartan (BENICAR) 20 MG tablet Take 1 tablet (20 mg total) by mouth daily. 01/10/22   Dorethea Clan, DO                                                                                                                                    Past Surgical History No past surgical history on file. Family History Family History  Problem Relation Age of Onset   Colon cancer Neg Hx    Colon polyps Neg Hx    Esophageal cancer Neg Hx    Stomach cancer Neg Hx    Rectal cancer Neg Hx     Social History Social History   Tobacco Use   Smoking status: Never   Smokeless tobacco: Never  Substance Use Topics   Alcohol use: No    Alcohol/week: 0.0 standard drinks of alcohol   Drug use: No   Allergies Novolog [insulin aspart (human analog)] and Penicillins  Review of Systems Review of Systems  Cardiovascular:  Positive for leg swelling.  Skin:  Positive for wound.    Physical Exam Vital Signs  I have reviewed the triage vital signs BP (!) 162/77   Pulse 86   Temp 98.6 F (37 C) (Oral)   Resp 18   Ht 6' (1.829 m)   Wt 95.3 kg   SpO2 99%   BMI 28.48 kg/m   Physical Exam Constitutional:      General: He is not in acute distress.    Appearance: Normal appearance.  HENT:     Head: Normocephalic and atraumatic.     Nose: No congestion or rhinorrhea.  Eyes:     General:        Right eye: No discharge.        Left eye: No discharge.     Extraocular Movements: Extraocular movements intact.     Pupils: Pupils are equal, round, and reactive to light.  Cardiovascular:     Rate and Rhythm: Normal rate and regular rhythm.     Heart sounds: No murmur heard. Pulmonary:     Effort: No  respiratory distress.     Breath sounds: No wheezing  or rales.  Abdominal:     General: There is no distension.     Tenderness: There is no abdominal tenderness.  Musculoskeletal:        General: Normal range of motion.     Cervical back: Normal range of motion.     Right lower leg: Edema present.     Left lower leg: Edema present.  Skin:    General: Skin is warm and dry.     Findings: Lesion present.  Neurological:     General: No focal deficit present.     Mental Status: He is alert.     ED Results and Treatments Labs (all labs ordered are listed, but only abnormal results are displayed) Labs Reviewed  COMPREHENSIVE METABOLIC PANEL - Abnormal; Notable for the following components:      Result Value   Potassium 5.8 (*)    CO2 21 (*)    Glucose, Bld 252 (*)    BUN 25 (*)    Creatinine, Ser 2.04 (*)    Albumin 3.3 (*)    Total Bilirubin 0.1 (*)    GFR, Estimated 37 (*)    All other components within normal limits  CBC WITH DIFFERENTIAL/PLATELET - Abnormal; Notable for the following components:   Hemoglobin 11.6 (*)    HCT 34.9 (*)    MCV 79.3 (*)    All other components within normal limits  SEDIMENTATION RATE - Abnormal; Notable for the following components:   Sed Rate 58 (*)    All other components within normal limits  C-REACTIVE PROTEIN - Abnormal; Notable for the following components:   CRP 2.5 (*)    All other components within normal limits  CULTURE, BLOOD (ROUTINE X 2)  CULTURE, BLOOD (ROUTINE X 2)  LACTIC ACID, PLASMA  BRAIN NATRIURETIC PEPTIDE                                                                                                                          Radiology DG Tibia/Fibula Left  Result Date: 01/26/2022 CLINICAL DATA:  Left leg pain, initial encounter EXAM: LEFT TIBIA AND FIBULA - 2 VIEW COMPARISON:  None Available. FINDINGS: Mild degenerative changes are noted about the left knee joint. No acute fracture or dislocation is noted in the  tibia and fibula. 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 No soft tissue abnormality is noted. IMPRESSION: No acute abnormality noted. Electronically Signed   By: Inez Catalina M.D.   On: 01/26/2022 20:19   DG Foot Complete Left  Result Date: 01/26/2022 CLINICAL DATA:  Left foot wound EXAM: LEFT FOOT - COMPLETE 3+ VIEW COMPARISON:  11/01/21 FINDINGS: Transmetatarsal amputation is noted similar to that seen on prior exam. Mild soft tissue swelling is noted the distal aspect of the stump although no bony erosive changes are identified to suggest osteomyelitis. No fracture is seen. IMPRESSION: Postsurgical changes. Mild soft tissue swelling is noted distally without evidence of osteomyelitis. Electronically Signed   By: Inez Catalina  M.D.   On: 01/26/2022 20:18   DG Foot Complete Right  Result Date: 01/26/2022 CLINICAL DATA:  Right foot wound, history of prior transmetatarsal amputation. EXAM: RIGHT FOOT COMPLETE - 3+ VIEW COMPARISON:  10/29/2021 FINDINGS: Soft tissue wound is noted distally. No acute fracture or dislocation is noted. No discrete bony erosive changes are identified to suggest osteomyelitis. IMPRESSION: Soft tissue wound without bony erosive changes. Electronically Signed   By: Inez Catalina M.D.   On: 01/26/2022 20:17   VAS [place] LOWER EXTREMITY VENOUS (DVT) (7a-7p)  Result Date: 01/26/2022  Lower Venous DVT Study Patient Name:  HALVOR BEHREND  Date of Exam:   01/26/2022 Medical Rec #: [id number]   Accession #:    [id number] Date of Birth: 01-Mar-1965   Patient Gender: M Patient Age:   55 years Exam Location:  [facility] Procedure:      VAS [place] LOWER EXTREMITY VENOUS (DVT) Referring Phys: Judson Roch SMOOT --------------------------------------------------------------------------------  Indications: Pain.  Risk Factors: None identified. Limitations: Poor ultrasound/tissue interface and open wound. Comparison Study: No prior studies. Performing Technologist: Oliver Hum RVT  Examination  Guidelines: A complete evaluation includes B-mode imaging, spectral Doppler, color Doppler, and power Doppler as needed of all accessible portions of each vessel. Bilateral testing is considered an integral part of a complete examination. Limited examinations for reoccurring indications may be performed as noted. The reflux portion of the exam is performed with the patient in reverse Trendelenburg.  +---------+---------------+---------+-----------+----------+-------------------+ RIGHT    CompressibilityPhasicitySpontaneityPropertiesThrombus Aging      +---------+---------------+---------+-----------+----------+-------------------+ CFV      Full           Yes      Yes                                      +---------+---------------+---------+-----------+----------+-------------------+ SFJ      Full                                                             +---------+---------------+---------+-----------+----------+-------------------+ FV Prox  Full                                                             +---------+---------------+---------+-----------+----------+-------------------+ FV Mid   Full                                                             +---------+---------------+---------+-----------+----------+-------------------+ FV DistalFull                                                             +---------+---------------+---------+-----------+----------+-------------------+ PFV      Full                                                             +---------+---------------+---------+-----------+----------+-------------------+  POP      Full           Yes      Yes                                      +---------+---------------+---------+-----------+----------+-------------------+ PTV      Full                                                             +---------+---------------+---------+-----------+----------+-------------------+ PERO                                                   Not well visualized +---------+---------------+---------+-----------+----------+-------------------+   +---------+---------------+---------+-----------+----------+-------------------+ LEFT     CompressibilityPhasicitySpontaneityPropertiesThrombus Aging      +---------+---------------+---------+-----------+----------+-------------------+ CFV      Full           Yes      Yes                                      +---------+---------------+---------+-----------+----------+-------------------+ SFJ      Full                                                             +---------+---------------+---------+-----------+----------+-------------------+ FV Prox  Full                                                             +---------+---------------+---------+-----------+----------+-------------------+ FV Mid   Full                                                             +---------+---------------+---------+-----------+----------+-------------------+ FV DistalFull                                                             +---------+---------------+---------+-----------+----------+-------------------+ PFV      Full                                                             +---------+---------------+---------+-----------+----------+-------------------+ POP      Full  Yes      Yes                                      +---------+---------------+---------+-----------+----------+-------------------+ PTV      Full                                                             +---------+---------------+---------+-----------+----------+-------------------+ PERO                                                  Not well visualized +---------+---------------+---------+-----------+----------+-------------------+    Summary: RIGHT: - There is no evidence of deep vein thrombosis in the lower extremity.  However, portions of this examination were limited- see technologist comments above.  - No cystic structure found in the popliteal fossa.  LEFT: - There is no evidence of deep vein thrombosis in the lower extremity. However, portions of this examination were limited- see technologist comments above.  - No cystic structure found in the popliteal fossa.  *See table(s) above for measurements and observations.    Preliminary     Pertinent labs & imaging results that were available during my care of the patient were reviewed by me and considered in my medical decision making (see MDM for details).  Medications Ordered in ED Medications  furosemide (LASIX) tablet 40 mg (40 mg Oral Given 01/27/22 0110)  sodium zirconium cyclosilicate (LOKELMA) packet 10 g (10 g Oral Given 01/27/22 0110)                                                                                                                                     Procedures Procedures  (including critical care time)  Medical Decision Making / ED Course   This patient presents to the ED for concern of lower extremity edema, this involves an extensive number of treatment options, and is a complaint that carries with it a high risk of complications and morbidity.  The differential diagnosis includes gravity dependent edema, fluid overload, persistent osteomyelitis, DVT, heart failure, hypoalbuminemia  MDM: Patient seen Emergency Department for evaluation of bilateral lower extremity edema.  Physical exam with bilateral lower extremity pitting edema into the calves.  Transmetatarsal amputation site is healing well with appropriate granulation tissue on the right.  There is a small weeping wound on the left likely secondary to stasis dermatitis from fluid overload.  Laboratory evaluation with a hemoglobin of 11.6 with an MCV of 79.3, potassium elevated at 5.8 but EKG with no changes of hyperkalemia  and patient was given Lokelma.  BUN 25, creatinine 2.04,  blood cultures obtained and CRP minimally elevated at 2.5, ESR 58.  Bilateral lower extremity DVT ultrasounds were performed that were reassuringly negative for DVT.  X-ray imaging without evidence of osteomyelitis.  I started the patient on oral Lasix in the setting of his hyperkalemia I suspect the wounds on his legs are secondary to likely gravity dependent edema.  He does not have any signs of fluid overload in the chest and his BNP is normal.  I had long discussion with the patient about compression and elevation of his lower extremities and ordered compression stockings for the patient.  There was a significant delay in getting the compression stockings delivered from central supply and the patient requested to leave the emergency department prior to receiving these.  Thus I gave the patient a printed prescription to pick these up at an outside pharmacy and we applied Ace wraps to both lower extremities.  Patient states he has follow-up next week with his primary care physician and I informed him he will need to have repeat creatinine testing and monitor for improvement.  Patient then discharged   Additional history obtained: -Additional history obtained from wife -External records from outside source obtained and reviewed including: Chart review including previous notes, labs, imaging, consultation notes   Lab Tests: -I ordered, reviewed, and interpreted labs.   The pertinent results include:   Labs Reviewed  COMPREHENSIVE METABOLIC PANEL - Abnormal; Notable for the following components:      Result Value   Potassium 5.8 (*)    CO2 21 (*)    Glucose, Bld 252 (*)    BUN 25 (*)    Creatinine, Ser 2.04 (*)    Albumin 3.3 (*)    Total Bilirubin 0.1 (*)    GFR, Estimated 37 (*)    All other components within normal limits  CBC WITH DIFFERENTIAL/PLATELET - Abnormal; Notable for the following components:   Hemoglobin 11.6 (*)    HCT 34.9 (*)    MCV 79.3 (*)    All other components within  normal limits  SEDIMENTATION RATE - Abnormal; Notable for the following components:   Sed Rate 58 (*)    All other components within normal limits  C-REACTIVE PROTEIN - Abnormal; Notable for the following components:   CRP 2.5 (*)    All other components within normal limits  CULTURE, BLOOD (ROUTINE X 2)  CULTURE, BLOOD (ROUTINE X 2)  LACTIC ACID, PLASMA  BRAIN NATRIURETIC PEPTIDE      EKG   EKG Interpretation  Date/Time:  Saturday January 27 2022 00:52:10 EST Ventricular Rate:  95 PR Interval:  154 QRS Duration: 74 QT Interval:  352 QTC Calculation: 442 R Axis:   -43 Text Interpretation: Sinus rhythm with Premature atrial complexes Left axis deviation Abnormal ECG When compared with ECG of 14-Jan-2009 16:08, PREVIOUS ECG IS PRESENT Confirmed by Solveig Fangman ([id number]) on 01/27/2022 1:44:56 AM         Imaging Studies ordered: I ordered imaging studies including bilateral lower extremity DVT ultrasounds, x-ray foot bilaterally, x-ray tib-fib left I independently visualized and interpreted imaging. I agree with the radiologist interpretation   Medicines ordered and prescription drug management: Meds ordered this encounter  Medications   furosemide (LASIX) tablet 40 mg   sodium zirconium cyclosilicate (LOKELMA) packet 10 g   furosemide (LASIX) 20 MG tablet    Sig: Take 1 tablet (20 mg total) by mouth 2 (two) times daily for  7 days.    Dispense:  14 tablet    Refill:  0   Elastic Bandages & Supports (MEDICAL COMPRESSION STOCKINGS) MISC    Sig: 1 each by Does not apply route daily.    Dispense:  2 each    Refill:  0    -I have reviewed the patients home medicines and have made adjustments as needed  Critical interventions none   Cardiac Monitoring: The patient was maintained on a cardiac monitor.  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

## 2022-01-27 NOTE — ED Notes (Signed)
Patient verbalizes understanding of d/c instructions. Opportunities for questions and answers were provided. Pt d/c from ED and wheeled to lobby with family.  

## 2022-01-31 LAB — CULTURE, BLOOD (ROUTINE X 2)
Culture: NO GROWTH
Culture: NO GROWTH
Special Requests: ADEQUATE

## 2022-02-01 ENCOUNTER — Encounter: Payer: Self-pay | Admitting: Student

## 2022-02-01 ENCOUNTER — Other Ambulatory Visit: Payer: Self-pay

## 2022-02-01 ENCOUNTER — Ambulatory Visit (INDEPENDENT_AMBULATORY_CARE_PROVIDER_SITE_OTHER): Payer: Medicaid Other | Admitting: Student

## 2022-02-01 VITALS — BP 143/72 | HR 81 | Temp 98.4°F | Wt 214.5 lb

## 2022-02-01 DIAGNOSIS — N179 Acute kidney failure, unspecified: Secondary | ICD-10-CM | POA: Diagnosis present

## 2022-02-01 DIAGNOSIS — E1165 Type 2 diabetes mellitus with hyperglycemia: Secondary | ICD-10-CM | POA: Diagnosis not present

## 2022-02-01 DIAGNOSIS — Z9641 Presence of insulin pump (external) (internal): Secondary | ICD-10-CM

## 2022-02-01 DIAGNOSIS — T8741 Infection of amputation stump, right upper extremity: Secondary | ICD-10-CM | POA: Diagnosis not present

## 2022-02-01 DIAGNOSIS — Z89439 Acquired absence of unspecified foot: Secondary | ICD-10-CM

## 2022-02-01 DIAGNOSIS — Z89431 Acquired absence of right foot: Secondary | ICD-10-CM | POA: Diagnosis not present

## 2022-02-01 DIAGNOSIS — D649 Anemia, unspecified: Secondary | ICD-10-CM | POA: Diagnosis not present

## 2022-02-01 DIAGNOSIS — Z794 Long term (current) use of insulin: Secondary | ICD-10-CM

## 2022-02-01 MED ORDER — DOXYCYCLINE HYCLATE 100 MG PO TABS
100.0000 mg | ORAL_TABLET | Freq: Two times a day (BID) | ORAL | 0 refills | Status: DC
  Filled 2022-02-01: qty 30, 15d supply, fill #0

## 2022-02-01 NOTE — Patient Instructions (Addendum)
Today we discussed your foot wound and diabetes.  Please bring your medications to your next appointment.  Return in 1 week for follow up of R foot and diabetes.   I will call you with the results of the following laboratory test(s):   Lab Orders         BMP8+Anion Gap         CBC no Diff         Iron, TIBC and Ferritin Panel      Expect a call from the following department(s):  Radiology for MRI of right foot.  Please call our clinic at [phone] Monday through Friday from 9 am to 4 pm if you have questions or concerns about your health. If after hours or on the weekend, call the main hospital number and ask for the Internal Medicine Resident On-Call. If you need medication refills, please notify your pharmacy one week in advance and they will send [place] a request.   Best, Nani Gasser, [place]

## 2022-02-01 NOTE — Assessment & Plan Note (Signed)
Patient's blood sugar remains high.  Based on a quick review of his blood glucose monitor, it appears his capillary blood glucose has been measuring over 400 for the last several days.  His wife reports that he sometimes forgets to take his insulin.  He also tends to eat throughout the day.    I have requested that this patient return to clinic in a week and that he try his best to measure fasting blood glucose in the morning and postprandial blood glucose levels while adhering to his current insulin regimen of 30 units of Levemir twice daily and 3 units of Humalog 3 times daily with meals.  His wife is on board with this plan and reports that she will try her best to promote adherence.

## 2022-02-01 NOTE — Assessment & Plan Note (Signed)
On approximately 01/27/2022 he went to the emergency department for worsening pain and drainage from his right foot.  History is notable for transmetatarsal amputation of right foot on 12/01/2018, transmetatarsal amputation of left foot 11/06/2020, both for osteomyelitis.  More recently, in October 2023 he had a more proximal transmetatarsal amputation of the right foot for recurrent osteomyelitis.  The right foot remains open and draining.  The left foot is relatively well-healed.  He also reports that the was having subjective fevers the day he went to the ED.  On exam the right foot has wound covering about half the stump with pale macerated tissue at the margins, some eschar, and healthy appearing pink granulation tissue in the center.  Right dorsalis pedis is intact.  At recent ED visit his inflammatory markers were still elevated.    Primary concern at this time is for residual osteomyelitis.  No signs of sepsis or systemic infection today.  Will order MRI.  During last hospitalization he was treated with a 6-week course of IV daptomycin and oral ciprofloxacin based on intraoperative cultures and susceptibility testing which showed MRSA, strep, and Proteus.  Recommend starting doxycycline today for MRSA coverage.  May also consider the addition to Cipro for antipseudomonal activity given the chronic nature of this patient's foot wound.

## 2022-02-01 NOTE — Progress Notes (Signed)
Subjective:  [name] is a 57 y.o. who presents to clinic for concerns of foot pain and diabetes.  He presents today with his wife and his interviewed with the assistance of a video Arabic interpreter.   Patient Active Problem List   Diagnosis Date Noted   AKI (acute kidney injury) ([place]) 01/02/2022   History of transmetatarsal amputation of foot ([place]) 05/02/2021   Essential hypertension 03/10/2018   Neuropathy due to type 2 diabetes mellitus ([place]) 02/14/2016   Healthcare maintenance 12/25/2011   Type 2 diabetes mellitus with hyperglycemia ([place]) 12/06/2005   Hyperlipidemia associated with type 2 diabetes mellitus ([place]) 12/06/2005   Objective:   Vitals:   02/01/22 0840  BP: (!) 143/72  Pulse: 81  Temp: 98.4 F (36.9 C)  TempSrc: Oral  SpO2: 100%  Weight: 214 lb 8 oz (97.3 kg)    Physical Exam Constitutional:      General: He is not in acute distress.    Appearance: Normal appearance.  HENT:     Mouth/Throat:     Mouth: Mucous membranes are moist.  Eyes:     Conjunctiva/sclera: Conjunctivae normal.  Cardiovascular:     Rate and Rhythm: Normal rate and regular rhythm.     Pulses: Normal pulses.  Pulmonary:     Effort: Pulmonary effort is normal.     Breath sounds: Normal breath sounds. No stridor.  Abdominal:     Palpations: Abdomen is soft.     Tenderness: There is no abdominal tenderness.  Musculoskeletal:     Right lower leg: Edema present.     Left lower leg: Edema present.  Lymphadenopathy:     Cervical: No cervical adenopathy.  Skin:    General: Skin is warm and dry.  Neurological:     Mental Status: He is alert. Mental status is at baseline.  Psychiatric:        Mood and Affect: Mood normal.        Behavior: Behavior normal.     Assessment & Plan:  The primary encounter diagnosis was AKI (acute kidney injury) ([place]). Diagnoses of Anemia, unspecified type, History of transmetatarsal amputation of foot ([place]), and Type 2 diabetes mellitus with  hyperglycemia, with long-term current use of insulin ([place]) were also pertinent to this visit.  History of transmetatarsal amputation of foot ([place]) On approximately 01/27/2022 he went to the emergency department for worsening pain and drainage from his right foot.  History is notable for transmetatarsal amputation of right foot on 12/01/2018, transmetatarsal amputation of left foot 11/06/2020, both for osteomyelitis.  More recently, in October 2023 he had a more proximal transmetatarsal amputation of the right foot for recurrent osteomyelitis.  The right foot remains open and draining.  The left foot is relatively well-healed.  He also reports that the was having subjective fevers the day he went to the ED.  On exam the right foot has wound covering about half the stump with pale macerated tissue at the margins, some eschar, and healthy appearing pink granulation tissue in the center.  Right dorsalis pedis is intact.  At recent ED visit his inflammatory markers were still elevated.    Primary concern at this time is for residual osteomyelitis.  No signs of sepsis or systemic infection today.  Will order MRI.  During last hospitalization he was treated with a 6-week course of IV daptomycin and oral ciprofloxacin based on intraoperative cultures and susceptibility testing which showed MRSA, strep, and Proteus.  Recommend starting doxycycline today for  MRSA coverage.  May also consider the addition to Cipro for antipseudomonal activity given the chronic nature of this patient's foot wound.  AKI (acute kidney injury) ([place]) Creatinine was approximately 2 during recent ED visit from baseline of approximately 1.1.  Potassium was also elevated at 5.8.  Patient was discharged from the emergency department with a prescription for daily furosemide.  No signs of sepsis or systemic infection today is reassuring.  May be in setting of poor p.o. intake rather than endorgan damage from sepsis.  Will repeat a BMP today.  Type  2 diabetes mellitus with hyperglycemia ([facility]) Patient's blood sugar remains high.  Based on a quick review of his blood glucose monitor, it appears his capillary blood glucose has been measuring over 400 for the last several days.  His wife reports that he sometimes forgets to take his insulin.  He also tends to eat throughout the day.    I have requested that this patient return to clinic in a week and that he try his best to measure fasting blood glucose in the morning and postprandial blood glucose levels while adhering to his current insulin regimen of 30 units of Levemir twice daily and 3 units of Humalog 3 times daily with meals.  His wife is on board with this plan and reports that she will try her best to promote adherence.    Return in 1 week for follow up of right foot and diabetes.  Patient seen with Dr. Luna Kitchens MD 02/01/2022, 10:03 PM  Pager: [phone]

## 2022-02-01 NOTE — Assessment & Plan Note (Signed)
Creatinine was approximately 2 during recent ED visit from baseline of approximately 1.1.  Potassium was also elevated at 5.8.  Patient was discharged from the emergency department with a prescription for daily furosemide.  No signs of sepsis or systemic infection today is reassuring.  May be in setting of poor p.o. intake rather than endorgan damage from sepsis.  Will repeat a BMP today.

## 2022-02-02 ENCOUNTER — Other Ambulatory Visit (HOSPITAL_COMMUNITY): Payer: Self-pay

## 2022-02-02 ENCOUNTER — Encounter (HOSPITAL_BASED_OUTPATIENT_CLINIC_OR_DEPARTMENT_OTHER): Payer: Medicaid Other | Attending: General Surgery | Admitting: General Surgery

## 2022-02-02 DIAGNOSIS — N183 Chronic kidney disease, stage 3 unspecified: Secondary | ICD-10-CM | POA: Diagnosis not present

## 2022-02-02 DIAGNOSIS — I129 Hypertensive chronic kidney disease with stage 1 through stage 4 chronic kidney disease, or unspecified chronic kidney disease: Secondary | ICD-10-CM | POA: Diagnosis not present

## 2022-02-02 DIAGNOSIS — L97522 Non-pressure chronic ulcer of other part of left foot with fat layer exposed: Secondary | ICD-10-CM | POA: Diagnosis not present

## 2022-02-02 DIAGNOSIS — E1151 Type 2 diabetes mellitus with diabetic peripheral angiopathy without gangrene: Secondary | ICD-10-CM | POA: Diagnosis not present

## 2022-02-02 DIAGNOSIS — L97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 without evidence of necrosis: Secondary | ICD-10-CM | POA: Diagnosis not present

## 2022-02-02 DIAGNOSIS — E11621 Type 2 diabetes mellitus with foot ulcer: Secondary | ICD-10-CM | POA: Diagnosis present

## 2022-02-02 DIAGNOSIS — E1122 Type 2 diabetes mellitus with diabetic chronic kidney disease: Secondary | ICD-10-CM | POA: Insufficient documentation

## 2022-02-02 DIAGNOSIS — I890 Lymphedema, not elsewhere classified: Secondary | ICD-10-CM | POA: Insufficient documentation

## 2022-02-02 LAB — CBC
Hematocrit: 35.5 % — ABNORMAL LOW (ref 37.5–51.0)
Hemoglobin: 11.7 g/dL — ABNORMAL LOW (ref 13.0–17.7)
MCH: 25.3 pg — ABNORMAL LOW (ref 26.6–33.0)
MCHC: 33 g/dL (ref 31.5–35.7)
MCV: 77 fL — ABNORMAL LOW (ref 79–97)
Platelets: 338 10*3/uL (ref 150–450)
RBC: 4.63 x10E6/uL (ref 4.14–5.80)
RDW: 14.1 % (ref 11.6–15.4)
WBC: 9.3 10*3/uL (ref 3.4–10.8)

## 2022-02-02 LAB — BMP8+ANION GAP
Anion Gap: 13 mmol/L (ref 10.0–18.0)
BUN/Creatinine Ratio: 20 (ref 9–20)
BUN: 32 mg/dL — ABNORMAL HIGH (ref 6–24)
CO2: 19 mmol/L — ABNORMAL LOW (ref 20–29)
Calcium: 9.1 mg/dL (ref 8.7–10.2)
Chloride: 102 mmol/L (ref 96–106)
Creatinine, Ser: 1.57 mg/dL — ABNORMAL HIGH (ref 0.76–1.27)
Glucose: 432 mg/dL — ABNORMAL HIGH (ref 70–99)
Potassium: 5.3 mmol/L — ABNORMAL HIGH (ref 3.5–5.2)
Sodium: 134 mmol/L (ref 134–144)
eGFR: 51 mL/min/{1.73_m2} — ABNORMAL LOW (ref >59)

## 2022-02-02 LAB — IRON,TIBC AND FERRITIN PANEL
Ferritin: 183 ng/mL (ref 30–400)
Iron Saturation: 15 % (ref 15–55)
Iron: 38 ug/dL (ref 38–169)
Total Iron Binding Capacity: 256 ug/dL (ref 250–450)
UIBC: 218 ug/dL (ref 111–343)

## 2022-02-02 NOTE — Progress Notes (Signed)
Results notable for stable CBC, decreasing creatinine and potassium levels.  Glucose 432.  CO2 is marginally low and anion gap is 13.  No overt DKA, but this patient is at risk for development.  Called and left voicemail with help of interpreter.  We will see the patient back in clinic in a week.

## 2022-02-03 NOTE — Progress Notes (Signed)
[place], [name] ([id number]) [id number] Page 1 of 10 Visit Report for 02/02/2022 Chief Complaint Document Details Patient Name: Date of Service: [name] 02/02/2022 9:30 A M Medical Record Number: [id number] Patient Account Number: [id number] Date of Birth/Sex: Treating RN: 11-30-1965 (57 y.o. 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 [name] Primary Care Provider: Nani Gasser Other Clinician: Referring Provider: Treating Provider/Extender: Hattie Perch in Treatment: 0 Debridement Performed for Assessment: Wound #1 Left Amputation Site - Transmetatarsal Performed By: Physician Fredirick Maudlin, MD Debridement Type: Debridement Severity of Tissue Pre Debridement: Fat layer exposed Level of Consciousness (Pre-procedure): Awake and Alert Pre-procedure Verification/Time Out Yes - 10:08 Taken: Start Time: 10:09 Pain Control: Lidocaine 5% topical ointment T Area Debrided (L x W): otal 2 (cm) x 1 (cm) = 2 (cm) Tissue and other material debrided: Non-Viable, Callus, Slough, Skin: Epidermis, Slough Level: Skin/Epidermis Debridement  Description: Selective/Open Wound Instrument: Curette Bleeding: Minimum Hemostasis Achieved: Pressure Response to Treatment: Procedure was tolerated well Level of Consciousness (Post- Awake and Alert procedure): Post Debridement Measurements of Total Wound Length: (cm) 2 Width: (cm) 1 Depth: (cm) 0.1 Volume: (cm) 0.157 Character of Wound/Ulcer Post Debridement: Requires Further Debridement Severity of Tissue Post Debridement: Fat layer exposed Post Procedure Diagnosis [name] ([id number]) [id number] Page 2 of 10 Same as Pre-procedure Notes Scribed for Dr. Celine Ahr by Blanche East, RN Electronic Signature(s) Signed: 02/02/2022 1:30:14 PM By: Fredirick Maudlin MD FACS Signed: 02/02/2022 4:06:17 PM By: Blanche East RN Entered By: Blanche East on 02/02/2022 10:14:09 -------------------------------------------------------------------------------- Debridement Details Patient Name: Date of Service: [name] 02/02/2022 9:30 A M Medical Record Number: [id number] Patient Account Number: [id number] Date of Birth/Sex: Treating RN: [birth year] (57 y.o. [name] Primary Care Provider: Nani Gasser Other Clinician: Referring Provider: Treating Provider/Extender: Hattie Perch in Treatment: 0 Debridement Performed for Assessment: Wound #2 Right Amputation Site - Transmetatarsal Performed By: Physician Fredirick Maudlin, MD Debridement Type: Debridement Severity of Tissue Pre Debridement: Fat layer exposed Level of Consciousness (Pre-procedure): Awake and Alert Pre-procedure Verification/Time Out Yes - 10:08 Taken: Start Time: 10:09 Pain Control: Lidocaine 5% topical ointment T Area Debrided (L x W): otal 3.5 (cm) x 4.5 (cm) = 15.75 (cm) Tissue and other material debrided: Non-Viable, Callus, Slough, Subcutaneous, Skin: Epidermis, Slough Level: Skin/Subcutaneous Tissue Debridement Description: Excisional Instrument:  Curette Bleeding: Minimum Hemostasis Achieved: Pressure Response to Treatment: Procedure was tolerated well Level of Consciousness (Post- Awake and Alert procedure): Post Debridement Measurements of Total Wound Length: (cm) 4.5 Width: (cm) 3.5 Depth: (cm) 0.1 Volume: (cm) 1.237 Character of Wound/Ulcer Post Debridement: Requires Further Debridement Severity of Tissue Post Debridement: Fat layer exposed Post Procedure Diagnosis Same as Pre-procedure Notes Scribed for Dr. Celine Ahr by Blanche East, RN Electronic Signature(s) Signed: 02/02/2022 1:30:14 PM By: Fredirick Maudlin MD FACS Signed: 02/02/2022 4:06:17 PM By: Blanche East RN Entered By: Blanche East on  02/02/2022 10:16:54 HPI Details -------------------------------------------------------------------------------- [name] ([id number]) [id number].pdf Page 3 of 10 Patient Name: Date of Service: [name] 02/02/2022 9:30 A M Medical Record Number: [id number] Patient Account Number: [id number] Date of Birth/Sex: Treating RN: November 21, 1965 (57 y.o. M) Primary Care Provider: Nani Gasser Other Clinician: Referring Provider: Treating Provider/Extender: Hattie Perch in Treatment: 0 History of Present Illness HPI Description: ADMISSION 02/02/2022 This is a 57 year old [place] man who speaks only Arabic. The visit today was conducted with the assistance of the language line interpreter; the patient declines the use of an in person interpreter. He is a poorly controlled diabetic (last hemoglobin A1c 10.8, but it has been as high as 15 in the past) with CKD stage III and hypertension. 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 He required revision of the right TMA in October. This was done in the [facility] system. It is not entirely clear how he was referred to our clinic however he has  openings on the distal portion of the right foot as well as drainage coming from the plantar aspect of his left foot underneath some callus. The patient has just been applying dry gauze to both areas. Electronic Signature(s) Signed: 02/02/2022 12:53:18 PM By: Fredirick Maudlin MD FACS Previous Signature: 02/02/2022 12:46:17 PM Version By: Fredirick Maudlin MD FACS Entered By: Fredirick Maudlin on 02/02/2022 12:53:18 -------------------------------------------------------------------------------- Physical Exam Details Patient Name: Date of Service: [name] 02/02/2022 9:30 A M Medical Record Number: [id number] Patient Account Number: [id number] Date of Birth/Sex: Treating RN: 10/11/1965 (57 y.o. M) Primary Care Provider: Nani Gasser Other Clinician: Referring Provider: Treating Provider/Extender: Hattie Perch in Treatment: 0 Constitutional Slightly hypertensive. . . . No acute distress. Respiratory Normal work of breathing on room air. Notes 02/02/2022: At the site of his right TMA revision, there is granulation tissue callus, slough, areas of undermining, but no obvious signs of infection. On the left, just toward the plantar side of the old TMA scar, he has thick callus with a crack running through it and serosanguineous drainage present. Once the callus was debrided, there is a small circular opening into the fat layer. No concern for infection at this site either. Electronic Signature(s) Signed: 02/02/2022 12:57:07 PM By: Fredirick Maudlin MD FACS Entered By: Fredirick Maudlin on 02/02/2022 12:57:07 -------------------------------------------------------------------------------- Physician Orders Details Patient Name: Date of Service: [name] 02/02/2022 9:30 A M Medical Record Number: [id number] Patient Account Number: [id number] Date of Birth/Sex: Treating RN: 05-14-1965 (57 y.o. 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 - Dr. Celine Ahr Return A Anesthetic Wound #1 Left Amputation Site - Transmetatarsal (In clinic) Topical Lidocaine 5% applied to wound bed Off-Loading Wound #1 Left Amputation Site - Transmetatarsal DH Walker Boot to: Wound #2 Right Amputation Site - Transmetatarsal DH Walker Boot to: [place] to [facility] for skilled nursing wound care. May utilize formulary equivalent dressing  for wound treatment orders unless otherwise specified. - Skilled nursing for wound care Dressing changes to be completed by [place] on Tuesday / Thursday / Saturday except when patient has scheduled visit at [facility]. Wound Treatment Wound #1 - Amputation Site - Transmetatarsal Wound Laterality: Left Cleanser: Soap and Water 1 x Per Day/30 Days Discharge Instructions: May shower and wash wound with dial antibacterial soap and water prior to dressing change. Peri-Wound Care: Sween Lotion (Moisturizing lotion) 1 x Per Day/30 Days Discharge Instructions: Apply moisturizing lotion as directed Prim Dressing: Sorbalgon AG Dressing, 4x4 (in/in) 1 x Per Day/30 Days ary Discharge Instructions: Apply to wound bed as instructed Secondary Dressing: ALLEVYN Heel 4 1/2in x 5 1/2in / 10.5cm x 13.5cm 1 x Per Day/30 Days Discharge Instructions: Apply over primary dressing as directed. Secondary Dressing:  ABD Pad, 8x10 1 x Per Day/30 Days Discharge Instructions: Apply over primary dressing as directed. Secured With: Elastic Bandage 4 inch (ACE bandage) 1 x Per Day/30 Days Discharge Instructions: Secure with ACE bandage as directed. Secured With: [employer], 4.5x3.1 (in/yd) 1 x Per Day/30 Days Discharge Instructions: Secure with Kerlix as directed. Secured With: 45M Medipore H Soft Cloth Surgical T ape, 4 x 10 (in/yd) 1 x Per Day/30 Days Discharge Instructions: Secure with tape as directed. Wound #2 - Amputation Site - Transmetatarsal Wound Laterality: Right Cleanser: Soap and Water 1 x Per Day/30 Days Discharge Instructions: May shower and wash wound with dial antibacterial soap and water prior to dressing change. Peri-Wound Care: Sween Lotion (Moisturizing lotion) 1 x Per Day/30 Days Discharge Instructions: Apply moisturizing lotion as directed Prim Dressing: Sorbalgon AG Dressing, 4x4 (in/in) 1 x Per Day/30 Days ary Discharge Instructions: Apply to wound bed as instructed Secondary Dressing: ALLEVYN Heel 4 1/2in x 5 1/2in / 10.5cm x 13.5cm 1 x Per Day/30 Days Discharge Instructions: Apply over primary dressing as directed. Secondary Dressing: ABD Pad, 8x10 1 x Per Day/30 Days Discharge Instructions: Apply over primary dressing as directed. Secured With: Elastic Bandage 4 inch (ACE bandage) 1 x Per Day/30 Days Discharge Instructions: Secure with ACE bandage as directed. Secured With: [employer], 4.5x3.1 (in/yd) 1 x Per Day/30 Days [place], [name] ([id number]) [id number] Page 5 of 10 Discharge Instructions: Secure with Kerlix as directed. Secured With: 45M Medipore H Soft Cloth Surgical T ape, 4 x 10 (in/yd) 1 x Per Day/30 Days Discharge Instructions: Secure with tape as directed. Electronic Signature(s) Signed: 02/02/2022 1:30:14 PM By: Fredirick Maudlin MD FACS Entered By: Fredirick Maudlin on 02/02/2022  12:57:36 -------------------------------------------------------------------------------- Problem List Details Patient Name: Date of Service: [name] 02/02/2022 9:30 A M Medical Record Number: [id number] Patient Account Number: [id number] Date of Birth/Sex: Treating RN: 1965/06/30 (57 y.o. 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 02/02/2022 No Yes without evidence of necrosis L97.522 Non-pressure chronic ulcer of other part of left foot with fat layer exposed 02/02/2022 No Yes E11.621 Type 2 diabetes mellitus with foot ulcer 02/02/2022 No Yes Z89.439 Acquired absence of unspecified foot 02/02/2022 No Yes Inactive Problems Resolved Problems Electronic Signature(s) Signed: 02/02/2022 12:34:36 PM By: Fredirick Maudlin MD FACS Entered By: Fredirick Maudlin on 02/02/2022 12:34:36 -------------------------------------------------------------------------------- Progress Note Details Patient Name: Date of Service: [name], [name] 02/02/2022 9:30 A M  Medical Record Number: [id number] Patient Account Number: [id number] Date of Birth/Sex: Treating RN: 07/12/1965 (56 y.o. 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 The visit today was conducted with the assistance of the language line interpreter; the patient declines the use of an in person interpreter. He is a poorly controlled diabetic (last hemoglobin A1c 10.8, but it has been as high as 15 in the past) with CKD stage III and hypertension. 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 He required revision of the right TMA in October. This was done in the [facility] system. It is not entirely clear how he was referred to our clinic however he has openings on the distal portion of the right foot as well as drainage coming from the plantar aspect of his left foot underneath some callus. The patient has just been applying dry gauze to both areas. Patient History Allergies Novolog U-100 Insulin aspart (Severity: Moderate, Reaction: rash), penicillin (Severity: Moderate) Family History Unknown History, Cancer, No family history of Diabetes, Heart Disease, Hereditary Spherocytosis, Hypertension, Kidney Disease, Lung Disease, Seizures, Stroke, Thyroid Problems, Tuberculosis. Social History Never smoker, Marital Status - Married, Alcohol Use - Never, Drug Use - No History, Caffeine Use - Rarely. Medical History Hematologic/Lymphatic Patient has history of Lymphedema Cardiovascular Patient has history of Hypertension Endocrine Patient has history of Type II Diabetes Neurologic Patient has history of Neuropathy Patient is treated with Insulin, Oral Agents. Medical A Surgical History Notes nd Genitourinary AKI Objective Constitutional Slightly hypertensive. No acute distress. Vitals Time Taken: 9:32 AM, Height: 72 in, Weight: 214 lbs, BMI: 29, Temperature: 98.0 F, Pulse: 90 bpm, Respiratory Rate: 20 breaths/min, Blood Pressure: 146/84 mmHg, Capillary Blood Glucose: 109  mg/dl. Respiratory Normal work of breathing on room air. General Notes: 02/02/2022: At the site of his right TMA revision, there is granulation tissue callus, slough, areas of undermining, but no obvious signs of infection. On the left, just toward the plantar side of the old TMA scar, he has thick callus with a crack running through it and serosanguineous drainage present. Once the callus was debrided, there is a small circular opening into the fat layer. No concern for infection at this site either. Integumentary (Hair, Skin) Wound #1 status is Open. Original cause of wound was Surgical Injury. The date acquired was: 02/01/2022. The wound is located on the Left Amputation Site - Transmetatarsal. The wound measures 2cm length x 1cm width x 0.1cm depth; 1.571cm^2 area and 0.157cm^3 volume. There is Fat Layer (Subcutaneous Tissue) exposed. There is no tunneling or undermining noted. There is a medium amount of serous drainage noted. There is small (1-33%) red granulation within the wound bed. There is a large (67-100%) amount of necrotic tissue within the wound bed including Eschar. The periwound skin appearance had no abnormalities noted for color. The periwound skin appearance exhibited: Callus, Dry/Scaly. Periwound temperature was noted as No Abnormality. The periwound has tenderness on palpation. Wound #2 status is Open. Original cause of wound was Surgical Injury. The date acquired was: 10/30/2021. The  wound is located on the Right Amputation Site - Transmetatarsal. The wound measures 3.5cm length x 4.5cm width x 0.1cm depth; 12.37cm^2 area and 1.237cm^3 volume. There is no tunneling noted, however, there is undermining starting at 1:00 and ending at 3:00 with a maximum distance of 0.3cm. There is a medium amount of serous drainage noted. There is medium (34-66%) red, pink granulation within the wound bed. There is a medium (34-66%) amount of necrotic tissue within the wound bed including [facility], [place]  ([id number]) [id number] Page 7 of 10 Adherent [place]. The periwound skin appearance had no abnormalities noted for color. The periwound skin appearance exhibited: Callus, Maceration. The periwound skin appearance did not exhibit: Dry/Scaly. Periwound temperature was noted as No Abnormality. 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 There was a Selective/Open Wound Skin/Epidermis Debridement with a total area of 2 sq cm performed by Fredirick Maudlin, MD. With the following instrument(s): Curette to remove Non-Viable tissue/material. Material removed includes Callus, Slough, and Skin: Epidermis after achieving pain control using Lidocaine 5% topical ointment. No specimens were taken. A time out was conducted at 10:08, prior to the start of the procedure. A Minimum amount of bleeding was controlled with Pressure. The procedure was tolerated well. Post Debridement Measurements: 2cm length x 1cm width x 0.1cm depth; 0.157cm^3 volume. Character of Wound/Ulcer Post Debridement requires further debridement. Severity of Tissue Post Debridement is: Fat layer exposed. Post procedure Diagnosis Wound #1: Same as Pre-Procedure General Notes: Scribed for Dr. Celine Ahr by Blanche East, RN. Wound #2 Pre-procedure diagnosis of Wound #2 is a Diabetic Wound/Ulcer of the Lower Extremity located on the Right Amputation Site - Transmetatarsal .Severity of Tissue Pre Debridement is: Fat layer exposed. There was a Excisional  Skin/Subcutaneous Tissue Debridement with a total area of 15.75 sq cm performed by Fredirick Maudlin, MD. With the following instrument(s): Curette to remove Non-Viable tissue/material. Material removed includes Callus, Subcutaneous Tissue, Slough, and Skin: Epidermis after achieving pain control using Lidocaine 5% topical ointment. No specimens were taken. A time out was conducted at 10:08, prior to the start of the procedure. A Minimum amount of bleeding was controlled with Pressure. The procedure was tolerated well. Post Debridement Measurements: 4.5cm length x 3.5cm width x 0.1cm depth; 1.237cm^3 volume. Character of Wound/Ulcer Post Debridement requires further debridement. Severity of Tissue Post Debridement is: Fat layer exposed. Post procedure Diagnosis Wound #2: Same as Pre-Procedure General Notes: Scribed for Dr. Celine Ahr by Blanche East, RN. Plan Follow-up Appointments: Return Appointment in 1 week. - Dr. Celine Ahr Anesthetic: Wound #1 Left Amputation Site - Transmetatarsal: (In clinic) Topical Lidocaine 5% applied to wound bed Off-Loading: Wound #1 Left Amputation Site - Transmetatarsal: DH Walker Boot to: Wound #2 Right Amputation Site - Transmetatarsal: DH Walker Boot to: Home Health: Admit to [facility] for skilled nursing wound care. May utilize formulary equivalent dressing for wound treatment orders unless otherwise specified. - Skilled nursing for wound care Dressing changes to be completed by [place] on Tuesday / Thursday / Saturday except when patient has scheduled visit at [facility]. WOUND #1: - Amputation Site -  Transmetatarsal Wound Laterality: Left Cleanser: Soap and Water 1 x Per Day/30 Days Discharge Instructions: May shower and wash wound with dial antibacterial soap and water prior to dressing change. Peri-Wound Care: Sween Lotion (Moisturizing lotion) 1 x Per Day/30 Days Discharge Instructions: Apply moisturizing lotion as directed Prim Dressing:  Sorbalgon AG Dressing, 4x4 (in/in) 1 x Per Day/30 Days ary Discharge Instructions: Apply to wound bed as instructed Secondary Dressing: ALLEVYN Heel 4 1/2in x 5 1/2in / 10.5cm x 13.5cm 1 x Per Day/30 Days Discharge Instructions: Apply over primary dressing as directed. Secondary Dressing: ABD Pad, 8x10 1 x Per Day/30 Days Discharge Instructions: Apply over primary dressing as directed. Secured With: Elastic Bandage 4 inch (ACE bandage) 1 x Per Day/30 Days Discharge Instructions: Secure with ACE bandage as directed. Secured With: [employer], 4.5x3.1 (in/yd) 1 x Per Day/30 Days Discharge Instructions: Secure with Kerlix as directed. Secured With: 38M Medipore H Soft Cloth Surgical T ape, 4 x 10 (in/yd) 1 x Per Day/30 Days Discharge Instructions: Secure with tape as directed. WOUND #2: - Amputation Site - Transmetatarsal Wound Laterality: Right Cleanser: Soap and Water 1 x Per Day/30 Days Discharge Instructions: May shower and wash wound with dial antibacterial soap and water prior to dressing change. Peri-Wound Care: Sween Lotion (Moisturizing lotion) 1 x Per Day/30 Days Discharge Instructions: Apply moisturizing lotion as directed Prim Dressing: Sorbalgon AG Dressing, 4x4 (in/in) 1 x Per Day/30 Days Earvin [place], [name] ([id number]) [id number] Page 8 of 10 Discharge Instructions: Apply to wound bed as instructed Secondary Dressing: ALLEVYN Heel 4 1/2in x 5 1/2in / 10.5cm x 13.5cm 1 x Per Day/30 Days Discharge Instructions: Apply over primary dressing as directed. Secondary Dressing: ABD Pad, 8x10 1 x Per Day/30 Days Discharge Instructions: Apply over primary dressing as directed. Secured With: Elastic Bandage 4 inch (ACE bandage) 1 x Per Day/30 Days Discharge Instructions: Secure with ACE bandage as directed. Secured With: [employer], 4.5x3.1 (in/yd) 1 x Per Day/30 Days Discharge Instructions: Secure with Kerlix as directed. Secured With: 38M  Medipore H Soft Cloth Surgical T ape, 4 x 10 (in/yd) 1 x Per Day/30 Days Discharge Instructions: Secure with tape as directed. 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 At the site of his right TMA revision, there is granulation tissue callus, slough, areas of undermining, but no obvious signs of infection. On the left, just toward the plantar side of the old TMA scar, he has thick callus with a crack running through it and serosanguineous drainage present. Once the callus was debrided, there is a small circular opening into the fat layer. No concern for infection at this site either. I used a curette to debride callus, slough, and skin from the left foot wound. I similarly debrided callus, slough, skin, and nonviable subcutaneous tissue from the right foot wound. We are going to use silver alginate on both sites. He already has a cam boot for his right foot. We will give him a forefoot offloading shoe for his left foot. He will be padded liberally. Follow-up in 1 week. Electronic Signature(s) Signed: 02/02/2022 12:59:19 PM By: Fredirick Maudlin MD FACS Entered By: Fredirick [place] on 02/02/2022 12:59:19 -------------------------------------------------------------------------------- HxROS Details Patient Name: Date of Service: [name] 02/02/2022 9:30 A M Medical Record Number: [id number] Patient Account Number: [id number] Date of Birth/Sex: Treating RN: 06/05/1965 (57 y.o. [name] Primary Care Provider: Nani Gasser Other Clinician: Referring Provider: Treating Provider/Extender: Hattie Perch in Treatment:  0 Hematologic/Lymphatic Medical History: Positive for: Lymphedema Cardiovascular Medical History: Positive for: Hypertension Endocrine Medical History: Positive for: Type II Diabetes Treated with: Insulin, Oral agents Genitourinary Medical History: Past Medical History Notes: AKI Neurologic Medical History: Positive  for: Neuropathy Immunizations Pneumococcal Vaccine: Received Pneumococcal Vaccination: Yes Received Pneumococcal Vaccination On or After [address]Yader Criger, [name] ([id number]) [id number] Page 9 of 10 Implantable Devices No devices added Family and Social History Unknown History: Yes; Cancer: Yes; Diabetes: No; Heart Disease: No; Hereditary Spherocytosis: No; Hypertension: No; Kidney Disease: No; Lung Disease: No; Seizures: No; Stroke: No; Thyroid Problems: No; Tuberculosis: No; Never smoker; Marital Status - Married; Alcohol Use: Never; Drug Use: No History; Caffeine Use: Rarely; Financial Concerns: No; Food, Clothing or Shelter Needs: No; Support System Lacking: No; Transportation Concerns: No Physician Affirmation I have reviewed and agree with the above information. Electronic Signature(s) Signed: 02/02/2022 1:30:14 PM By: Fredirick Maudlin MD FACS Signed: 02/02/2022 4:06:17 PM By: Blanche East RN Entered By: Blanche East on 02/02/2022 10:55:31 -------------------------------------------------------------------------------- SuperBill Details Patient Name: Date of Service: [name] 02/02/2022 Medical Record Number: [id number] Patient Account Number: [id number] Date of Birth/Sex: Treating RN: 12-Jun-1965 (57 y.o. 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 E11.621 Type 2 diabetes mellitus with foot ulcer Z89.439 Acquired absence of unspecified foot Facility Procedures : CPT4 Code: [id number] Description: [place] VISIT-LEV 4 EST PT Modifier: 25 Quantity: 1 : CPT4 Code:  [id number] Description: Samuel - DEB SUBQ TISSUE 20 SQ CM/< ICD-10 Diagnosis Description 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 wit Modifier: hout evidence of n Quantity: 1 ecrosis : CPT4 Code: [id number] Description: 27035 - DEBRIDE WOUND 1ST 20 SQ CM OR < ICD-10 Diagnosis Description L97.522 Non-pressure chronic ulcer of other part of left foot with fat layer exposed Modifier: Quantity: 1 Physician Procedures : CPT4 Code Description Modifier [id number] - WC PHYS LEVEL 4 - NEW PT 25 ICD-10 Diagnosis Description 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 without evidence of ne L97.522 Non-pressure chronic ulcer of  other part of left foot with fat layer exposed E11.621 Type 2 diabetes mellitus with foot ulcer Z89.439 Acquired absence of unspecified foot Quantity: 1 crosis : [id number] - WC PHYS SUBQ TISS 20 SQ CM ICD-10 Diagnosis Description 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

## 2022-02-03 NOTE — Progress Notes (Signed)
[place], [name] ([id number]) [id number] Page 1 of 11 Visit Report for 02/02/2022 Allergy List Details Patient Name: Date of Service: Samuel Pigg MA R 02/02/2022 9:30 A M Medical Record Number: [id number] Patient Account Number: [id number] Date of Birth/Sex: Treating RN: 03/27/1965 (57 y.o. [name] Session Primary Care Xzavior Reinig: Nani Gasser Other Clinician: Referring Yeshaya Vath: Treating Yamilex Borgwardt/Extender: Wandra Scot Weeks in Treatment: 0 Allergies Active Allergies Novolog U-100 Insulin aspart Reaction: rash Severity: Moderate penicillin Severity: Moderate Allergy Notes Electronic Signature(s) Signed: 02/01/2022 3:57:10 PM By: Blanche East RN Entered By: Blanche East on 02/01/2022 15:57:10 -------------------------------------------------------------------------------- Arrival Information Details Patient Name: Date of Service: Samuel Pigg MA R 02/02/2022 9:30 A M Medical Record Number: [id number] Patient Account Number: [id number] Date of Birth/Sex: Treating RN: 12-06-1965 (57 y.o. M) Primary Care Edwardine Deschepper: Nani Gasser Other Clinician: Referring Rosalio Catterton: Treating Kain Milosevic/Extender: Hattie Perch in Treatment: 0 Visit Information Patient Arrived: Wheel Chair Arrival Time: 09:31 Accompanied By: wife Transfer Assistance: None Patient Identification Verified: Yes Secondary Verification Process Completed: Yes Electronic Signature(s) Signed: 02/02/2022 10:36:23 AM By: Worthy Rancher Entered By: Worthy Rancher on 02/02/2022 09:32:17 Alene Mires, [name] ([id number]) [id number] Page 2 of 11 -------------------------------------------------------------------------------- Clinic Level of Care Assessment Details Patient Name: Date of Service: Samuel Pigg MA R 02/02/2022 9:30 A M Medical Record Number: [id number] Patient Account Number: [id number] Date of Birth/Sex: Treating RN: Jun 18, 1965 (57 y.o. [name] Session Primary Care Elexia Friedt: Nani Gasser Other Clinician: Referring Rayhan Groleau: Treating Armie Moren/Extender: Hattie Perch in Treatment: 0 Clinic Level of Care Assessment Items TOOL 1 Quantity Score X- 1 0 Use when EandM and Procedure is performed on INITIAL visit ASSESSMENTS - Nursing Assessment / Reassessment X- 1 20 General Physical Exam (combine w/ comprehensive assessment (listed just below) when performed on new pt. evals) X- 1 25 Comprehensive Assessment (HX, ROS, Risk Assessments, Wounds Hx, etc.) ASSESSMENTS - Wound and Skin Assessment / Reassessment '[]'$  - 0 Dermatologic / Skin Assessment (not related to wound area) ASSESSMENTS - Ostomy and/or Continence Assessment and Care '[]'$  - 0 Incontinence Assessment and Management '[]'$  - 0 Ostomy Care Assessment and Management (repouching, etc.) PROCESS - Coordination of Care X - Simple Patient / Family Education for ongoing care 1 15 '[]'$  - 0 Complex (extensive) Patient / Family Education for ongoing care X- 1 10 Staff obtains Programmer, systems, Records, T Results / Process Orders est X- 1 10 Staff telephones HHA, Nursing Homes / Clarify orders / etc '[]'$  - 0 Routine Transfer to another Facility (non-emergent condition) '[]'$  - 0 Routine Hospital Admission (non-emergent condition) X- 1 15 New Admissions / Biomedical engineer / Ordering NPWT Apligraf, etc. , '[]'$  - 0 Emergency Hospital Admission (emergent condition) PROCESS - Special Needs '[]'$  - 0 Pediatric / Minor Patient Management '[]'$  - 0 Isolation Patient Management X- 1 15 Hearing / Language / Visual special needs '[]'$  - 0 Assessment of Community assistance (transportation, D/C planning, etc.) '[]'$  - 0 Additional assistance / Altered mentation '[]'$  - 0 Support Surface(s) Assessment (bed, cushion, seat, etc.) INTERVENTIONS - Miscellaneous '[]'$  - 0 External ear exam '[]'$  - 0 Patient Transfer (multiple staff / Civil Service fast streamer / Similar devices) '[]'$  -  0 Simple Staple / Suture removal (25 or less) '[]'$  - 0 Complex Staple / Suture removal (26 or more) '[]'$  - 0 Hypo/Hyperglycemic Management (do not check if billed separately) X- 1 15 Ankle / Brachial Index (ABI) - do not check if billed separately Has the patient been seen at the  hospital within the last three years: Yes Total Score: 125 Level Of Care: New/Established - Level 4 Electronic Signature(s) Signed: 02/02/2022 4:06:17 PM By: Blanche East RN Entered By: Blanche East on 02/02/2022 10:20:50 [name] ([id number]) [id number] Page 3 of 11 -------------------------------------------------------------------------------- Encounter Discharge Information Details Patient Name: Date of Service: Samuel Pigg MA R 02/02/2022 9:30 A M Medical Record Number: [id number] Patient Account Number: [id number] Date of Birth/Sex: Treating RN: 08/29/1965 (57 y.o. [name] Session Primary Care Emon Lance: Nani Gasser Other Clinician: Referring Kimia Finan: Treating Himmat Enberg/Extender: Hattie Perch in Treatment: 0 Encounter Discharge Information Items Post Procedure Vitals Discharge Condition: Stable Temperature (F): 98.0 Ambulatory Status: Ambulatory Pulse (bpm): 90 Discharge Destination: Home Respiratory Rate (breaths/min): 20 Transportation: Private Auto Blood Pressure (mmHg): 146/84 Accompanied By: self Schedule Follow-up Appointment: Yes Clinical Summary of Care: Electronic Signature(s) Signed: 02/02/2022 1:59:50 PM By: Blanche East RN Previous Signature: 02/02/2022 12:04:31 PM Version By: Blanche East RN Entered By: Blanche East on 02/02/2022 13:59:50 -------------------------------------------------------------------------------- Lower Extremity Assessment Details Patient Name: Date of Service: Samuel Pigg MA R 02/02/2022 9:30 A M Medical Record Number: [id number] Patient Account Number: [id number] Date of Birth/Sex: Treating RN: 07-29-1965  (57 y.o. [name] Session Primary Care [name]: Nani Gasser Other Clinician: Referring Khylie Larmore: Treating Jonhatan Hearty/Extender: Hattie Perch in Treatment: 0 Edema Assessment Assessed: Shirlyn Goltz: No] Patrice Paradise: No] [Left: Edema] [Right: :] Calf Left: Right: Point of Measurement: From Medial Instep 36 cm 35 cm Ankle Left: Right: Point of Measurement: From Medial Instep 25 cm 25.5 cm Vascular Assessment Pulses: Dorsalis Pedis Palpable: [Left:Yes] [Right:Yes] Electronic Signature(s) Signed: 02/02/2022 4:06:17 PM By: Blanche East RN Entered By: Blanche East on 02/02/2022 09:43:46 Alene Mires, [name] ([id number]) [id number] Page 4 of 11 -------------------------------------------------------------------------------- Multi Wound Chart Details Patient Name: Date of Service: Samuel Pigg MA R 02/02/2022 9:30 A M Medical Record Number: [id number] Patient Account Number: [id number] Date of Birth/Sex: Treating RN: 1965/05/21 (57 y.o. [name] Session Primary Care Ranya Fiddler: Nani Gasser Other Clinician: Referring Jadrien Narine: Treating Niley Helbig/Extender: Hattie Perch in Treatment: 0 Vital Signs Height(in): 72 Capillary Blood Glucose(mg/dl): 109 Weight(lbs): 214 Pulse(bpm): 55 Body Mass Index(BMI): 29 Blood Pressure(mmHg): 146/84 Temperature(F): 98.0 Respiratory Rate(breaths/min): 20 [1:Photos:] [N/A:N/A] Left Amputation Site - Transmetatarsal Right Amputation Site - N/A Wound Location: Transmetatarsal Surgical Injury Surgical Injury N/A Wounding Event: Diabetic Wound/Ulcer of the Lower Diabetic Wound/Ulcer of the Lower N/A Primary Etiology: Extremity Extremity Lymphedema, Hypertension, Type II Lymphedema, Hypertension, Type II N/A Comorbid History: Diabetes, Neuropathy Diabetes, Neuropathy 02/01/2022 10/30/2021 N/A Date Acquired: 0 0 N/A Weeks of Treatment: Open Open N/A Wound Status: No No N/A Wound  Recurrence: 2x1x0.1 3.5x4.5x0.1 N/A Measurements L x W x D (cm) 1.571 12.37 N/A A (cm) : rea 0.157 1.237 N/A Volume (cm) : 1 Starting Position 1 (o'clock): 3 Ending Position 1 (o'clock): 0.3 Maximum Distance 1 (cm): No Yes N/A Undermining: Grade 1 Grade 1 N/A Classification: Medium Medium N/A Exudate A mount: Serous Serous N/A Exudate Type: amber amber N/A Exudate Color: Small (1-33%) Medium (34-66%) N/A Granulation A mount: Red Red, Pink N/A Granulation Quality: Large (67-100%) Medium (34-66%) N/A Necrotic A mount: Eschar Adherent Slough N/A Necrotic Tissue: Fat Layer (Subcutaneous Tissue): Yes N/A N/A Exposed Structures: Fascia: No Tendon: No Muscle: No Joint: No Bone: No Debridement - Selective/Open Wound Debridement - Excisional N/A Debridement: Pre-procedure Verification/Time Out 10:08 10:08 N/A Taken: Lidocaine 5% topical ointment Lidocaine 5% topical ointment N/A Pain Control: Callus, Slough Callus, Subcutaneous, Slough N/A  Tissue Debrided: Skin/Epidermis Skin/Subcutaneous Tissue N/A Level: 2 15.75 N/A Debridement A (sq cm): rea Curette Curette N/A Instrument: Minimum Minimum N/A Bleeding: Pressure Pressure N/A Hemostasis A chieved: Procedure was tolerated well Procedure was tolerated well N/A Debridement Treatment Response: 2x1x0.1 4.5x3.5x0.1 N/A Post Debridement Measurements L x W x D (cm) 0.157 1.237 N/A Post Debridement Volume: (cm) Callus: Yes Callus: Yes N/A Periwound Skin TextureStark Klein ([id number]) [id number] Page 5 of 11 Dry/Scaly: Yes Maceration: Yes N/A Periwound Skin Moisture: Dry/Scaly: No No Abnormalities Noted No Abnormalities Noted N/A Periwound Skin Color: No Abnormality No Abnormality N/A Temperature: Yes N/A N/A Tenderness on Palpation: Debridement Debridement N/A Procedures Performed: Treatment Notes Wound #1 (Amputation Site - Transmetatarsal) Wound Laterality:  Left Cleanser Soap and Water Discharge Instruction: May shower and wash wound with dial antibacterial soap and water prior to dressing change. Peri-Wound Care Sween Lotion (Moisturizing lotion) Discharge Instruction: Apply moisturizing lotion as directed Topical Primary Dressing Sorbalgon AG Dressing, 4x4 (in/in) Discharge Instruction: Apply to wound bed as instructed Secondary Dressing ALLEVYN Heel 4 1/2in x 5 1/2in / 10.5cm x 13.5cm Discharge Instruction: Apply over primary dressing as directed. ABD Pad, 8x10 Discharge Instruction: Apply over primary dressing as directed. Secured With Elastic Bandage 4 inch (ACE bandage) Discharge Instruction: Secure with ACE bandage as directed. Kerlix Roll Sterile, 4.5x3.1 (in/yd) Discharge Instruction: Secure with Kerlix as directed. 58M Medipore H Soft Cloth Surgical T ape, 4 x 10 (in/yd) Discharge Instruction: Secure with tape as directed. Compression Wrap Compression Stockings Add-Ons Wound #2 (Amputation Site - Transmetatarsal) Wound Laterality: Right Cleanser Soap and Water Discharge Instruction: May shower and wash wound with dial antibacterial soap and water prior to dressing change. Peri-Wound Care Sween Lotion (Moisturizing lotion) Discharge Instruction: Apply moisturizing lotion as directed Topical Primary Dressing Sorbalgon AG Dressing, 4x4 (in/in) Discharge Instruction: Apply to wound bed as instructed Secondary Dressing ALLEVYN Heel 4 1/2in x 5 1/2in / 10.5cm x 13.5cm Discharge Instruction: Apply over primary dressing as directed. ABD Pad, 8x10 Discharge Instruction: Apply over primary dressing as directed. Secured With Elastic Bandage 4 inch (ACE bandage) Discharge Instruction: Secure with ACE bandage as directed. Kerlix Roll Sterile, 4.5x3.1 (in/yd) Discharge Instruction: Secure with Kerlix as directed. 58M Medipore H Soft Cloth Surgical T ape, 4 x 10 (in/yd) Discharge Instruction: Secure with tape as  directed. [place], [name] ([id number]) [id number] Page 6 of 11 Compression Wrap Compression Stockings Add-Ons Electronic Signature(s) Signed: 02/02/2022 12:34:54 PM By: Fredirick Maudlin MD FACS Signed: 02/02/2022 4:06:17 PM By: Blanche East RN Previous Signature: 02/02/2022 10:58:39 AM Version By: Blanche East RN Entered By: Fredirick Maudlin on 02/02/2022 12:34:54 -------------------------------------------------------------------------------- Multi-Disciplinary Care Plan Details Patient Name: Date of Service: Samuel Pigg MA R 02/02/2022 9:30 A M Medical Record Number: [id number] Patient Account Number: [id number] Date of Birth/Sex: Treating RN: 11-06-1965 (57 y.o. [name] Session Primary Care Merrik Puebla: Nani Gasser Other Clinician: Referring Domenica Weightman: Treating Osualdo Hansell/Extender: Hattie Perch in Treatment: 0 Active Inactive Nutrition Nursing Diagnoses: Impaired glucose control: actual or potential Goals: Patient/caregiver verbalizes understanding of need to maintain therapeutic glucose control per primary care physician Date Initiated: 02/02/2022 Target Resolution Date: 03/29/2022 Goal Status: Active Interventions: Assess patient nutrition upon admission and as needed per policy Provide education on elevated blood sugars and impact on wound healing Treatment Activities: Dietary management education, guidance and counseling : 02/02/2022 Notes: Orientation to the Wound Care Program Nursing Diagnoses: Knowledge deficit related to the wound healing center program Goals: Patient/caregiver will verbalize understanding of the Wound Healing  Center Program Date Initiated: 02/02/2022 Target Resolution Date: 03/29/2022 Goal Status: Active Interventions: Provide education on orientation to the wound center Notes: Wound/Skin Impairment Nursing Diagnoses: Impaired tissue integrity Goals: Ulcer/skin breakdown will have a volume reduction of  30% by week 4 Date Initiated: 02/02/2022 Target Resolution Date: 03/29/2022 Goal Status: Active [place], [name] ([id number]) [id number] Page 7 of 11 Interventions: Assess ulceration(s) every visit Provide education on ulcer and skin care Treatment Activities: Patient referred to home care : 02/02/2022 Skin care regimen initiated : 02/02/2022 Notes: Electronic Signature(s) Signed: 02/02/2022 4:06:17 PM By: Blanche East RN Entered By: Blanche East on 02/02/2022 10:13:12 -------------------------------------------------------------------------------- Pain Assessment Details Patient Name: Date of Service: Samuel Pigg MA R 02/02/2022 9:30 A M Medical Record Number: [id number] Patient Account Number: [id number] Date of Birth/Sex: Treating RN: [birth year] (57 y.o. [name] Session Primary Care Ari Engelbrecht: Nani Gasser Other Clinician: Referring Zyla Dascenzo: Treating Caoilainn Sacks/Extender: Hattie Perch in Treatment: 0 Active Problems Location of Pain Severity and Description of Pain Patient Has Paino Yes Site Locations Pain Location: Pain in Ulcers Rate the pain. Current Pain Level: 0 Worst Pain Level: 5 Pain Management and Medication Current Pain Management: Electronic Signature(s) Signed: 02/02/2022 4:06:17 PM By: Blanche East RN Entered By: Blanche East on 02/02/2022 09:56:10 -------------------------------------------------------------------------------- Patient/Caregiver Education Details Patient Name: Date of Service: Samuel Pigg MA R 1/5/2024andnbsp9:30 [place], [name] ([id number]) [id number] Page 8 of 11 Medical Record Number: [id number] Patient Account Number: [id number] Date of Birth/Gender: Treating RN: July 18, 1965 (57 y.o. [name] Session Primary Care Physician: Nani Gasser Other Clinician: Referring Physician: Treating Physician/Extender: Hattie Perch in Treatment:  0 Education Assessment Education Provided To: Patient Education Topics Provided Elevated Blood Sugar/ Impact on Healing: Handouts: Elevated Blood Sugars: How Do They Affect Wound Healing, Other: Arabic Methods: Explain/Verbal Responses: Reinforcements needed, State content correctly Wound Debridement: Handouts: Wound Debridement, Other: Arabic Responses: Reinforcements needed, State content correctly Electronic Signature(s) Signed: 02/02/2022 4:06:17 PM By: Blanche East RN Entered By: Blanche East on 02/02/2022 10:14:02 -------------------------------------------------------------------------------- Wound Assessment Details Patient Name: Date of Service: Samuel Pigg MA R 02/02/2022 9:30 A M Medical Record Number: [id number] Patient Account Number: [id number] Date of Birth/Sex: Treating RN: 03-Jul-1965 (57 y.o. [name] Session Primary Care Wyndi Northrup: Nani Gasser Other Clinician: Referring Murphy Duzan: Treating Colbie Danner/Extender: Hattie Perch in Treatment: 0 Wound Status Wound Number: 1 Primary Etiology: Diabetic Wound/Ulcer of the Lower Extremity Wound Location: Left Amputation Site - Transmetatarsal Wound Status: Open Wounding Event: Surgical Injury Comorbid Lymphedema, Hypertension, Type II Diabetes, Neuropathy History: Date Acquired: 02/01/2022 Weeks Of Treatment: 0 Clustered Wound: No Photos Wound Measurements Length: (cm) 2 Width: (cm) 1 Depth: (cm) 0.1 Area: (cm) 1.571 Volume: (cm) 0.157 Pizzini, Yogi ([id number]) Wound Description Classification: Grade 1 Exudate Amount: Medium Exudate Type: Serous Exudate Color: amber Foul Odor After Cleansing: No Slough/Fibrino Yes % Reduction in Area: % Reduction in Volume: Tunneling: No Undermining: No [id number] Page 9 of 11 Wound Bed Granulation Amount: Small (1-33%) Exposed Structure Granulation Quality: Red Fascia Exposed: No Necrotic Amount: Large  (67-100%) Fat Layer (Subcutaneous Tissue) Exposed: Yes Necrotic Quality: Eschar Tendon Exposed: No Muscle Exposed: No Joint Exposed: No Bone Exposed: No Periwound Skin Texture Texture Color No Abnormalities Noted: No No Abnormalities Noted: Yes Callus: Yes Temperature / Pain Temperature: No Abnormality Moisture No Abnormalities Noted: No Tenderness on Palpation: Yes Dry / Scaly: Yes Treatment Notes Wound #1 (Amputation Site - Transmetatarsal) Wound Laterality: Left Cleanser Soap and Water Discharge  Instruction: May shower and wash wound with dial antibacterial soap and water prior to dressing change. Peri-Wound Care Sween Lotion (Moisturizing lotion) Discharge Instruction: Apply moisturizing lotion as directed Topical Primary Dressing Sorbalgon AG Dressing, 4x4 (in/in) Discharge Instruction: Apply to wound bed as instructed Secondary Dressing ALLEVYN Heel 4 1/2in x 5 1/2in / 10.5cm x 13.5cm Discharge Instruction: Apply over primary dressing as directed. ABD Pad, 8x10 Discharge Instruction: Apply over primary dressing as directed. Secured With Elastic Bandage 4 inch (ACE bandage) Discharge Instruction: Secure with ACE bandage as directed. Kerlix Roll Sterile, 4.5x3.1 (in/yd) Discharge Instruction: Secure with Kerlix as directed. 15M Medipore H Soft Cloth Surgical T ape, 4 x 10 (in/yd) Discharge Instruction: Secure with tape as directed. Compression Wrap Compression Stockings Add-Ons Electronic Signature(s) Signed: 02/02/2022 10:36:23 AM By: Worthy Rancher Signed: 02/02/2022 4:06:17 PM By: Blanche East RN Entered By: Worthy Rancher on 02/02/2022 09:50:36 Alene Mires, [name] ([id number]) [id number] Page 10 of 11 -------------------------------------------------------------------------------- Wound Assessment Details Patient Name: Date of Service: Samuel Pigg MA R 02/02/2022 9:30 A M Medical Record Number: [id number] Patient Account Number: [id number] Date of  Birth/Sex: Treating RN: [birth year] (57 y.o. [name] Session Primary Care Zylpha Poynor: Nani Gasser Other Clinician: Referring Suha Schoenbeck: Treating Jenalee Trevizo/Extender: Hattie Perch in Treatment: 0 Wound Status Wound Number: 2 Primary Etiology: Diabetic Wound/Ulcer of the Lower Extremity Wound Location: Right Amputation Site - Transmetatarsal Wound Status: Open Wounding Event: Surgical Injury Comorbid Lymphedema, Hypertension, Type II Diabetes, Neuropathy History: Date Acquired: 10/30/2021 Weeks Of Treatment: 0 Clustered Wound: No Photos Wound Measurements Length: (cm) 3.5 Width: (cm) 4.5 Depth: (cm) 0.1 Area: (cm) 12.37 Volume: (cm) 1.237 % Reduction in Area: % Reduction in Volume: Tunneling: No Undermining: Yes Starting Position (o'clock): 1 Ending Position (o'clock): 3 Maximum Distance: (cm) 0.3 Wound Description Classification: Grade 1 Exudate Amount: Medium Exudate Type: Serous Exudate Color: amber Foul Odor After Cleansing: No Slough/Fibrino Yes Wound Bed Granulation Amount: Medium (34-66%) Granulation Quality: Red, Pink Necrotic Amount: Medium (34-66%) Necrotic Quality: Adherent Slough Periwound Skin Texture Texture Color No Abnormalities Noted: No No Abnormalities Noted: Yes Callus: Yes Temperature / Pain Temperature: No Abnormality Moisture No Abnormalities Noted: No Dry / Scaly: No Maceration: Yes Treatment Notes Wound #2 (Amputation Site - Transmetatarsal) Wound Laterality: Right Cleanser [place], [name] ([id number]) [id number] Page 11 of 11 Soap and Water Discharge Instruction: May shower and wash wound with dial antibacterial soap and water prior to dressing change. Peri-Wound Care Sween Lotion (Moisturizing lotion) Discharge Instruction: Apply moisturizing lotion as directed Topical Primary Dressing Sorbalgon AG Dressing, 4x4 (in/in) Discharge Instruction: Apply to wound bed as  instructed Secondary Dressing ALLEVYN Heel 4 1/2in x 5 1/2in / 10.5cm x 13.5cm Discharge Instruction: Apply over primary dressing as directed. ABD Pad, 8x10 Discharge Instruction: Apply over primary dressing as directed. Secured With Elastic Bandage 4 inch (ACE bandage) Discharge Instruction: Secure with ACE bandage as directed. Kerlix Roll Sterile, 4.5x3.1 (in/yd) Discharge Instruction: Secure with Kerlix as directed. 15M Medipore H Soft Cloth Surgical T ape, 4 x 10 (in/yd) Discharge Instruction: Secure with tape as directed. Compression Wrap Compression Stockings Add-Ons Electronic Signature(s) Signed: 02/02/2022 10:36:23 AM By: Worthy Rancher Signed: 02/02/2022 4:06:17 PM By: Blanche East RN Entered By: Worthy Rancher on 02/02/2022 09:55:12 -------------------------------------------------------------------------------- Vitals Details Patient Name: Date of Service: Samuel Pigg MA R 02/02/2022 9:30 A M Medical Record Number: [id number] Patient Account Number: [id number] Date of Birth/Sex: Treating RN: 09/13/1965 (57 y.o. M) Primary Care Bridget Eagle: Nani Gasser Other Clinician: Referring Eric Nees: Treating  Buford Gayler/Extender: Wandra Scot Weeks in Treatment: 0 Vital Signs Time Taken: 09:32 Temperature (F): 98.0 Height (in): 72 Pulse (bpm): 90 Weight (lbs): 214 Respiratory Rate (breaths/min): 20 Body Mass Index (BMI): 29 Blood Pressure (mmHg): 146/84 Capillary Blood Glucose (mg/dl): 109 Reference Range: 80 - 120 mg / dl Electronic Signature(s) Signed: 02/02/2022 10:36:23 AM By: Worthy Rancher Entered By: Worthy Rancher on 02/02/2022 09:33:48

## 2022-02-03 NOTE — Progress Notes (Signed)
[place], [place] ([id number]) [id number] Page 1 of 4 Visit Report for 02/02/2022 Abuse Risk Screen Details Patient Name: Date of Service: Samuel Pigg MA R 02/02/2022 9:30 A M Medical Record Number: [id number] Patient Account Number: [id number] Date of Birth/Sex: Treating RN: Jan 24, 1966 (57 y.o. Waldron Session Primary Care Lourdes Kucharski: Nani Gasser Other Clinician: Referring Esgar Barnick: Treating Bernese Doffing/Extender: Hattie Perch in Treatment: 0 Abuse Risk Screen Items Answer ABUSE RISK SCREEN: Has anyone close to you tried to hurt or harm you recentlyo No Do you feel uncomfortable with anyone in your familyo No Has anyone forced you do things that you didnt want to doo No Electronic Signature(s) Signed: 02/02/2022 4:06:17 PM By: Blanche East RN Entered By: Blanche East on 02/02/2022 09:38:47 -------------------------------------------------------------------------------- Activities of Daily Living Details Patient Name: Date of Service: Samuel Pigg MA R 02/02/2022 9:30 A M Medical Record Number: [id number] Patient Account Number: [id number] Date of Birth/Sex: Treating RN: 11-10-1965 (57 y.o. Waldron Session Primary Care Prachi Oftedahl: Nani Gasser Other Clinician: Referring Rosemary Mossbarger: Treating Mardelle Pandolfi/Extender: Hattie Perch in Treatment: 0 Activities of Daily Living Items Answer Activities of Daily Living (Please select one for each item) Drive Automobile Completely Able T Medications ake Completely Able Use T elephone Completely Able Care for Appearance Completely Able Use T oilet Completely Able Bath / Shower Completely Able Dress Self Completely Able Feed Self Completely Able Walk Completely Able Get In / Out Bed Completely Able Housework Completely Able Prepare Meals Completely Able Handle Money Completely Able Shop for Self Completely Able Electronic Signature(s) Signed: 02/02/2022 4:06:17 PM  By: Blanche East RN Entered By: Blanche East on 02/02/2022 [place], [place] ([id number]) [id number] Page 2 of 4 -------------------------------------------------------------------------------- Education Screening Details Patient Name: Date of Service: [name] [place] R 02/02/2022 9:30 A M Medical Record Number: [id number] Patient Account Number: [id number] Date of Birth/Sex: Treating RN: 04/20/1965 (57 y.o. Waldron Session Primary Care Quanta Robertshaw: Nani Gasser Other Clinician: Referring Eboney Claybrook: Treating Regan Mcbryar/Extender: Hattie Perch in Treatment: 0 Learning Preferences/Education Level/Primary Language Learning Preference: Explanation Highest Education Level: College or Above Preferred Language: Other: ARABIC Cognitive Barrier Language Barrier: Yes Fish farm manager Needed: Yes Contract Interpreting Service Memory Deficit: No Emotional Barrier: No Cultural/Religious Beliefs Affecting Medical Care: No Physical Barrier Impaired Vision: No Impaired Hearing: No Decreased Hand dexterity: No Knowledge/Comprehension Knowledge Level: High Comprehension Level: High Ability to understand written instructions: High Ability to understand verbal instructions: High Motivation Anxiety Level: Calm Cooperation: Cooperative Education Importance: Acknowledges Need Interest in Health Problems: Asks Questions Perception: Coherent Willingness to Engage in Self-Management High Activities: Readiness to Engage in Self-Management High Activities: Electronic Signature(s) Signed: 02/02/2022 4:06:17 PM By: Blanche East RN Entered By: Blanche East on 02/02/2022 09:40:59 -------------------------------------------------------------------------------- Fall Risk Assessment Details Patient Name: Date of Service: Samuel Pigg MA R 02/02/2022 9:30 A M Medical Record Number: [id number] Patient Account Number: [id number] Date of Birth/Sex:  Treating RN: [birth year] (57 y.o. Waldron Session Primary Care Zaylie Gisler: Nani Gasser Other Clinician: Referring Mackynzie Woolford: Treating Maleeah Crossman/Extender: Hattie Perch in Treatment: 0 Fall Risk Assessment Items Have you had 2 or more falls in the last 12 monthso 0 No Have you had any fall that resulted in injury in the last 12 monthso 0 No [place], [place] ([id number]) [id number] Page 3 of 4 FALLS RISK SCREEN History of falling - immediate or within 3 months 0 No Secondary diagnosis (Do you have 2 or more medical diagnoseso)  0 No Ambulatory aid None/bed rest/wheelchair/nurse 0 Yes Crutches/cane/walker 0 No Furniture 0 No Intravenous therapy Access/Saline/Heparin Lock 0 No Gait/Transferring Normal/ bed rest/ wheelchair 0 Yes Weak (short steps with or without shuffle, stooped but able to lift head while walking, may seek 0 No support from furniture) Impaired (short steps with shuffle, may have difficulty arising from chair, head down, impaired 0 No balance) Mental Status Oriented to own ability 0 Yes Electronic Signature(s) Signed: 02/02/2022 4:06:17 PM By: Blanche East RN Entered By: Blanche East on 02/02/2022 09:41:31 -------------------------------------------------------------------------------- Foot Assessment Details Patient Name: Date of Service: Samuel Pigg MA R 02/02/2022 9:30 A M Medical Record Number: [id number] Patient Account Number: [id number] Date of Birth/Sex: Treating RN: 08-20-1965 (57 y.o. Waldron Session Primary Care Mccrae Speciale: Nani Gasser Other Clinician: Referring Cloud Graham: Treating Shantrell Placzek/Extender: Hattie Perch in Treatment: 0 Foot Assessment Items Site Locations + = Sensation present, - = Sensation absent, C = Callus, U = Ulcer R = Redness, W = Warmth, M = Maceration, PU = Pre-ulcerative lesion F = Fissure, S = Swelling, D = Dryness Assessment Right: Left: Other  Deformity: No No Prior Foot Ulcer: No No Prior Amputation: Yes Yes Charcot Joint: No No Ambulatory Status: Ambulatory With Help Assistance Device: Wheelchair GaitAndres Ege Marshall, [name] ([id number]) [id number] Page 4 of 4 Electronic Signature(s) Signed: 02/02/2022 4:06:17 PM By: Blanche East RN Entered By: Blanche East on 02/02/2022 09:42:47 -------------------------------------------------------------------------------- Nutrition Risk Screening Details Patient Name: Date of Service: Samuel Pigg MA R 02/02/2022 9:30 A M Medical Record Number: [id number] Patient Account Number: [id number] Date of Birth/Sex: Treating RN: 1965-04-19 (57 y.o. Waldron Session Primary Care Kinsey Karch: Nani Gasser Other Clinician: Referring Ruchel Brandenburger: Treating Luciel Brickman/Extender: Hattie Perch in Treatment: 0 Height (in): 72 Weight (lbs): 214 Body Mass Index (BMI): 29 Nutrition Risk Screening Items Score Screening NUTRITION RISK SCREEN: I have an illness or condition that made me change the kind and/or amount of food I eat 0 No I eat fewer than two meals per day 0 No I eat few fruits and vegetables, or milk products 0 No I have three or more drinks of beer, liquor or wine almost every day 0 No I have tooth or mouth problems that make it hard for me to eat 0 No I don't always have enough money to buy the food I need 0 No I eat alone most of the time 0 No I take three or more different prescribed or over-the-counter drugs a day 1 Yes Without wanting to, I have lost or gained 10 pounds in the last six months 0 No I am not always physically able to shop, cook and/or feed myself 0 No Nutrition Protocols Good Risk Protocol Moderate Risk Protocol High Risk Proctocol Risk Level: Good Risk Score: 1 Electronic Signature(s) Signed: 02/02/2022 4:06:17 PM By: Blanche East RN Entered By: Blanche East on 02/02/2022 09:42:14

## 2022-02-05 NOTE — Progress Notes (Signed)
Internal Medicine Clinic Attending  I saw and evaluated the patient.  I personally confirmed the key portions of the history and exam documented by Dr. McLendon and I reviewed pertinent patient test results.  The assessment, diagnosis, and plan were formulated together and I agree with the documentation in the resident's note.  

## 2022-02-08 ENCOUNTER — Encounter: Payer: Self-pay | Admitting: Student

## 2022-02-08 ENCOUNTER — Other Ambulatory Visit: Payer: Self-pay

## 2022-02-08 ENCOUNTER — Other Ambulatory Visit (HOSPITAL_COMMUNITY): Payer: Self-pay

## 2022-02-08 ENCOUNTER — Ambulatory Visit: Payer: Medicaid Other | Admitting: Student

## 2022-02-08 VITALS — BP 128/70 | HR 92 | Temp 98.2°F | Ht 76.0 in | Wt 213.3 lb

## 2022-02-08 DIAGNOSIS — E1165 Type 2 diabetes mellitus with hyperglycemia: Secondary | ICD-10-CM | POA: Diagnosis not present

## 2022-02-08 DIAGNOSIS — N179 Acute kidney failure, unspecified: Secondary | ICD-10-CM

## 2022-02-08 DIAGNOSIS — Z89439 Acquired absence of unspecified foot: Secondary | ICD-10-CM

## 2022-02-08 DIAGNOSIS — Z794 Long term (current) use of insulin: Secondary | ICD-10-CM | POA: Diagnosis not present

## 2022-02-08 MED ORDER — ACCU-CHEK GUIDE VI STRP
1.0000 | ORAL_STRIP | Freq: Four times a day (QID) | 3 refills | Status: AC
  Filled 2022-02-08: qty 350, fill #0
  Filled 2022-06-07 – 2022-06-22 (×2): qty 350, 88d supply, fill #0

## 2022-02-08 MED ORDER — ACCU-CHEK GUIDE W/DEVICE KIT
PACK | Freq: Every morning | 0 refills | Status: DC
  Filled 2022-02-08: qty 1, 1d supply, fill #0

## 2022-02-08 MED ORDER — ACCU-CHEK SOFTCLIX LANCETS MISC
Freq: Four times a day (QID) | 3 refills | Status: AC
  Filled 2022-02-08: qty 300, 75d supply, fill #0
  Filled 2022-06-07: qty 100, 25d supply, fill #1

## 2022-02-08 MED ORDER — INSULIN DETEMIR 100 UNIT/ML FLEXPEN: 37.0000 [IU] | PEN_INJECTOR | Freq: Two times a day (BID) | SUBCUTANEOUS | 3 refills | Status: DC

## 2022-02-08 MED ORDER — EMPAGLIFLOZIN 10 MG PO TABS
10.0000 mg | ORAL_TABLET | Freq: Every day | ORAL | 0 refills | Status: DC
  Filled 2022-02-08: qty 30, 30d supply, fill #0

## 2022-02-08 MED ORDER — INSULIN LISPRO 100 UNIT/ML IJ SOLN
5.0000 [IU] | Freq: Three times a day (TID) | INTRAMUSCULAR | 3 refills | Status: DC
  Filled 2022-02-08: qty 10, 67d supply, fill #0

## 2022-02-08 NOTE — Assessment & Plan Note (Signed)
Please that this patient is following so closely with wound care.  He has an appointment with them tomorrow as well.  MRI still pending.  Concern for residual osteomyelitis in the right foot remains high.

## 2022-02-08 NOTE — Assessment & Plan Note (Signed)
At last visit had a lingering AKI with hyperkalemia, both of which were improving since visiting the ED couple of weeks ago.  Repeat BMP today.

## 2022-02-08 NOTE — Assessment & Plan Note (Addendum)
7-day average blood sugars around 220 with 20 measurements noted.  The patient notes fasting blood sugars around this average, but sometimes as high as 300.  He reports no lows less than 100.  He is using 35 levemir BID, 3 units of mealtime Humalog, and 1.5 mg of Trulicity once weekly. The patient is insulin requiring, ICD 10 code E11.65. The patient tests 3 times per day.  Overall seems to be in better spirits today.  I am pleased with how frequently he has checked his blood sugar over the last week and he was encouraged for this effort.  Today, I will increase his Levemir to 37 units twice daily, his Humalog to 5 units 3 times daily before meals, and add empagliflozin 10 mg daily.  Follow-up in a couple of weeks to assess response to therapy.

## 2022-02-08 NOTE — Progress Notes (Signed)
Subjective:  [name] is a 57 y.o. who presents to clinic for follow-up of his diabetes.  He continues to take the doxycycline prescribed for presumptive diagnosis of osteomyelitis at last visit.  He is following closely with wound care for his right transmetatarsal stump wound.  He has been doing well by checking his blood sugar more frequently over the last week.  Review of Systems  Constitutional:  Negative for chills and fever.    Patient Active Problem List   Diagnosis Date Noted   AKI (acute kidney injury) ([place]) 01/02/2022   History of transmetatarsal amputation of foot ([place]) 05/02/2021   Essential hypertension 03/10/2018   Neuropathy due to type 2 diabetes mellitus ([place]) 02/14/2016   Healthcare maintenance 12/25/2011   Type 2 diabetes mellitus with hyperglycemia ([place]) 12/06/2005   Hyperlipidemia associated with type 2 diabetes mellitus ([place]) 12/06/2005    Family history: Reviewed in medical record.  Social history: Reviewed in medical record.  Objective:   Vitals:   02/08/22 1505  BP: 128/70  Pulse: 92  Temp: 98.2 F (36.8 C)  TempSrc: Oral  SpO2: 100%  Weight: 213 lb 4.8 oz (96.8 kg)  Height: '6\' 4"'$  (1.93 m)    Physical Exam Vitals reviewed.  Constitutional:      General: He is not in acute distress.    Appearance: Normal appearance.  Pulmonary:     Effort: Pulmonary effort is normal.     Breath sounds: No stridor.  Skin:    General: Skin is warm and dry.  Neurological:     Mental Status: He is alert. Mental status is at baseline.  Psychiatric:        Mood and Affect: Mood normal.        Behavior: Behavior normal.    Assessment & Plan:  The primary encounter diagnosis was Type 2 diabetes mellitus with hyperglycemia, with long-term current use of insulin ([place]). Diagnoses of Uncontrolled type 2 diabetes mellitus with hyperglycemia ([place]), AKI (acute kidney injury) ([place]), and History of transmetatarsal amputation of foot ([place]) were also  pertinent to this visit.  Type 2 diabetes mellitus with hyperglycemia ([facility]) 7-day average blood sugars around 220 with 20 measurements noted.  The patient notes fasting blood sugars around this average, but sometimes as high as 300.  He reports no lows less than 100.  He is using 35 levemir BID, 3 units of mealtime Humalog, and 1.5 mg of Trulicity once weekly. The patient is insulin requiring, ICD 10 code E11.65. The patient tests 3 times per day.  Overall seems to be in better spirits today.  I am pleased with how frequently he has checked his blood sugar over the last week and he was encouraged for this effort.  Today, I will increase his Levemir to 37 units twice daily, his Humalog to 5 units 3 times daily before meals, and add empagliflozin 10 mg daily.  Follow-up in a couple of weeks to assess response to therapy.   AKI (acute kidney injury) ([place]) At last visit had a lingering AKI with hyperkalemia, both of which were improving since visiting the ED couple of weeks ago.  Repeat BMP today.  History of transmetatarsal amputation of foot ([place]) Please that this patient is following so closely with wound care.  He has an appointment with them tomorrow as well.  MRI still pending.  Concern for residual osteomyelitis in the right foot remains high.    Return in a couple of weeks for follow up of  diabetes.  Patient seen with Dr. Luna Kitchens MD 02/08/2022, 8:30 PM  Pager: [phone]

## 2022-02-08 NOTE — Patient Instructions (Addendum)
Today we discussed diabetes.  Increase your long acting insulin to 37 units twice per day.  Increase your mealtime insulin to 5 units 15-30 minutes before meals.  I am also starting a medicine called Jardiance that you will take once daily.  Continue checking your blood sugar once in the morning before your first meal or drink and before each meal.  We will try to get a better glucose monitoring device for you.  Return in a couple of weeks for follow up of diabetes.   I will call you with the results of the following laboratory test(s):  Lab Orders         BMP8+Anion Gap         POCT Glucose (Device for Home Use)      Please call our clinic at [phone] Monday through Friday from 9 am to 4 pm if you have questions or concerns about your health. If after hours or on the weekend, call the main hospital number and ask for the Internal Medicine Resident On-Call. If you need medication refills, please notify your pharmacy one week in advance and they will send [place] a request.   Best, Nani Gasser, [place]

## 2022-02-09 ENCOUNTER — Encounter (HOSPITAL_BASED_OUTPATIENT_CLINIC_OR_DEPARTMENT_OTHER): Payer: Medicaid Other | Admitting: General Surgery

## 2022-02-09 ENCOUNTER — Other Ambulatory Visit (HOSPITAL_COMMUNITY): Payer: Self-pay

## 2022-02-09 DIAGNOSIS — E11621 Type 2 diabetes mellitus with foot ulcer: Secondary | ICD-10-CM | POA: Diagnosis not present

## 2022-02-09 LAB — BMP8+ANION GAP
Anion Gap: 13 mmol/L (ref 10.0–18.0)
BUN/Creatinine Ratio: 22 — ABNORMAL HIGH (ref 9–20)
BUN: 37 mg/dL — ABNORMAL HIGH (ref 6–24)
CO2: 20 mmol/L (ref 20–29)
Calcium: 9.6 mg/dL (ref 8.7–10.2)
Chloride: 107 mmol/L — ABNORMAL HIGH (ref 96–106)
Creatinine, Ser: 1.69 mg/dL — ABNORMAL HIGH (ref 0.76–1.27)
Glucose: 183 mg/dL — ABNORMAL HIGH (ref 70–99)
Potassium: 4.9 mmol/L (ref 3.5–5.2)
Sodium: 140 mmol/L (ref 134–144)
eGFR: 47 mL/min/{1.73_m2} — ABNORMAL LOW (ref >59)

## 2022-02-09 NOTE — Progress Notes (Signed)
Internal Medicine Clinic Attending  Case discussed with Dr. McLendon  At the time of the visit.  We reviewed the resident's history and exam and pertinent patient test results.  I agree with the assessment, diagnosis, and plan of care documented in the resident's note.  

## 2022-02-09 NOTE — Progress Notes (Signed)
Creatinine still elevated, but stable. Hyperkalemia resolved. No changes to management at this time. Patient/family called and updated regarding results.

## 2022-02-10 NOTE — Progress Notes (Signed)
Pope, [name] ([id number]) [id number] Page 1 of 9 Visit Report for 02/09/2022 Chief Complaint Document Details Patient Name: Date of Service: Samuel Pigg MA R 02/09/2022 7:45 A M Medical Record Number: [id number] Patient Account Number: [id number] Date of Birth/Sex: Treating RN: 20-Jul-1965 (57 y.Samuel. 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 Waldron Session Primary Care Provider: Nani Gasser Other Clinician: Referring Provider: Treating Provider/Extender: San Morelle in Treatment: 1 Debridement Performed for Assessment: Wound #2 Right Amputation Site - Transmetatarsal Performed By: Physician Fredirick Maudlin, MD Debridement Type: Debridement Severity of Tissue Pre Debridement: Fat layer exposed Level of Consciousness (Pre-procedure): Awake and Alert Pre-procedure Verification/Time Out Yes - 08:15 Taken: Start Time: 08:16 Pain Control: Lidocaine 5% topical ointment T Area Debrided (L x W): otal 2 (cm) x 2 (cm) = 4 (cm) Tissue and other material debrided: Non-Viable, Callus, Slough, Skin: Epidermis, Slough Level:  Skin/Epidermis Debridement Description: Selective/Open Wound Instrument: Curette Bleeding: Minimum Hemostasis Achieved: Pressure Response to Treatment: Procedure was tolerated well Level of Consciousness (Post- Awake and Alert procedure): Post Debridement Measurements of Total Wound Length: (cm) 2 Width: (cm) 2 Depth: (cm) 0.1 Volume: (cm) 0.314 Character of Wound/Ulcer Post Debridement: Requires Further Debridement Severity of Tissue Post Debridement: Fat layer exposed Post Procedure Diagnosis [name], [name] ([id number]) [id number] Page 2 of 9 Same as Pre-procedure Notes Scribed for Dr. Celine Ahr by Blanche East, RN Electronic Signature(s) Signed: 02/09/2022 11:31:20 AM By: Fredirick Maudlin MD FACS Signed: 02/09/2022 4:39:48 PM By: Blanche East RN Entered By: Blanche East on 02/09/2022 08:25:12 -------------------------------------------------------------------------------- Debridement Details Patient Name: Date of Service: Samuel Pigg MA R 02/09/2022 7:45 A M Medical Record Number: [id number] Patient Account Number: [id number] Date of Birth/Sex: Treating RN: Sep 24, 1965 (38 y.Samuel. Waldron Session Primary Care Provider: Nani Gasser Other Clinician: Referring Provider: Treating Provider/Extender: San Morelle in Treatment: 1 Debridement Performed for Assessment: Wound #1 Left Amputation Site - Transmetatarsal Performed By: Physician Fredirick Maudlin, MD Debridement Type: Debridement Severity of Tissue Pre Debridement: Fat layer exposed Level of Consciousness (Pre-procedure): Awake and Alert Pre-procedure Verification/Time Out Yes - 08:15 Taken: Start Time: 08:16 Pain Control: Lidocaine 5% topical ointment T Area Debrided (L x W): otal 0.8 (cm) x 0.3 (cm) = 0.24 (cm) Tissue and other material debrided: Non-Viable, Callus, Skin: Epidermis Level: Skin/Epidermis Debridement Description: Selective/Open Wound Instrument:  Curette Bleeding: Minimum Hemostasis Achieved: Pressure Response to Treatment: Procedure was tolerated well Level of Consciousness (Post- Awake and Alert procedure): Post Debridement Measurements of Total Wound Length: (cm) 0.8 Width: (cm) 0.3 Depth: (cm) 0.1 Volume: (cm) 0.019 Character of Wound/Ulcer Post Debridement: Requires Further Debridement Severity of Tissue Post Debridement: Fat layer exposed Post Procedure Diagnosis Same as Pre-procedure Notes Scribed for Dr. Celine Ahr by Blanche East, RN Electronic Signature(s) Signed: 02/09/2022 11:31:20 AM By: Fredirick Maudlin MD FACS Signed: 02/09/2022 4:39:48 PM By: Blanche East RN Entered By: Blanche East on 02/09/2022 08:25:28 HPI  Details -------------------------------------------------------------------------------- [name] ([id number]) [id number] Page 3 of 9 Patient Name: Date of Service: Samuel Pigg MA R 02/09/2022 7:45 A M Medical Record Number: [id number] Patient Account Number: [id number] Date of Birth/Sex: Treating RN: 09/08/1965 (29 y.Samuel. M) Primary Care Provider: Nani Gasser Other Clinician: Referring Provider: Treating Provider/Extender: San Morelle in Treatment: 1 History of Present Illness HPI Description: ADMISSION 02/02/2022 This is a 57 year old [place] man who speaks only Arabic. The visit today was conducted with the assistance of the language line interpreter; the patient declines the use of an in person interpreter. He is a poorly controlled diabetic (last hemoglobin A1c 10.8, but it has been as high as 15 in the past) with CKD stage III and hypertension. 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 He required revision of the right TMA in October. This was done in the [place] system. It is not entirely clear how he was referred to our clinic however he  has openings on the distal portion of the right foot as well as drainage coming from the plantar aspect of his left foot underneath some callus. The patient has just been applying dry gauze to both areas. 02/09/2022: The left-sided wound is quite a bit smaller today, but is covered with callus. The right sided wound has also contracted a little bit and is very clean, but there is also callus accumulation around the perimeter. Unfortunately, due to his Medicaid status, we have been unable to secure home health assistance and he has been changing the dressings on his own. He also has difficulty obtaining dressing supplies. Electronic Signature(s) Signed: 02/09/2022 8:37:55 AM By: Fredirick Maudlin MD FACS Entered By: Fredirick Maudlin on 02/09/2022 08:37:54 -------------------------------------------------------------------------------- Physical Exam Details Patient Name: Date of Service: Samuel Pigg MA R 02/09/2022 7:45 A M Medical Record Number: [id number] Patient Account Number: [id number] Date of Birth/Sex: Treating RN: 23-Feb-1965 (33 y.Samuel. M) Primary Care Provider: Nani Gasser Other Clinician: Referring Provider: Treating Provider/Extender: San Morelle in Treatment: 1 Constitutional . . . . no acute distress. Respiratory Normal work of breathing on room air. Notes 02/09/2022: The left-sided wound is quite a bit smaller today, but is covered with callus. The right sided wound has also contracted a little bit and is very clean, but there is also callus accumulation around the perimeter. Electronic Signature(s) Signed: 02/09/2022 8:38:34 AM By: Fredirick Maudlin MD FACS Entered By: Fredirick Maudlin on 02/09/2022 08:38:34 -------------------------------------------------------------------------------- Physician Orders Details Patient Name: Date of Service: Samuel Pigg MA R 02/09/2022 7:45 A M Medical Record Number: [id number] Patient Account Number:  [id number] Date of Birth/Sex: Treating RN: 28-Apr-1965 (74 y.Samuel. 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 - Dr. Celine Ahr Return A Anesthetic Wound #1 Left Amputation Site - Transmetatarsal (In clinic) Topical Lidocaine 5% applied to wound bed Off-Loading Wound #1 Left Amputation Site - Transmetatarsal DH Walker Boot to: Wound #2 Right Amputation Site - Transmetatarsal [facility]  Walker Boot to: [facility] for skilled nursing wound care. May utilize formulary equivalent dressing for wound treatment orders unless otherwise specified. - Skilled nursing for wound care Dressing changes to be completed by [place] on Tuesday / Thursday / Saturday except when patient has scheduled visit at [facility]. Wound Treatment Wound #1 - Amputation Site - Transmetatarsal Wound Laterality: Left Cleanser: Soap and Water 1 x Per Day/30 Days Discharge Instructions: May shower and wash wound with dial antibacterial soap and water prior to dressing change. Peri-Wound Care: Sween Lotion (Moisturizing lotion) 1 x Per Day/30 Days Discharge Instructions: Apply moisturizing lotion as directed Prim Dressing: Sorbalgon AG Dressing, 4x4 (in/in) 1 x Per Day/30 Days ary Discharge Instructions: Apply to wound bed as instructed Secondary Dressing: ALLEVYN Heel 4 1/2in x 5  1/2in / 10.5cm x 13.5cm 1 x Per Day/30 Days Discharge Instructions: Apply over primary dressing as directed. Secondary Dressing: ABD Pad, 8x10 1 x Per Day/30 Days Discharge Instructions: Apply over primary dressing as directed. Secured With: Elastic Bandage 4 inch (ACE bandage) 1 x Per Day/30 Days Discharge Instructions: Secure with ACE bandage as directed. Secured With: [employer], 4.5x3.1 (in/yd) 1 x Per Day/30 Days Discharge Instructions: Secure with Kerlix as directed. Secured With: 69M Medipore H Soft Cloth Surgical T ape, 4 x 10 (in/yd) 1 x Per Day/30 Days Discharge Instructions: Secure with tape as directed. Wound #2 - Amputation Site - Transmetatarsal Wound Laterality: Right Cleanser: Soap and Water 1 x Per Day/30 Days Discharge Instructions: May shower and wash wound with dial antibacterial soap and water prior to dressing change. Peri-Wound Care: Sween Lotion (Moisturizing lotion) 1 x Per Day/30 Days Discharge Instructions: Apply moisturizing lotion as directed Prim Dressing: Sorbalgon AG Dressing, 4x4 (in/in) 1 x Per Day/30 Days ary Discharge Instructions: Apply to wound bed as instructed Secondary Dressing: ALLEVYN Heel 4 1/2in x 5 1/2in / 10.5cm x 13.5cm 1 x Per Day/30 Days Discharge Instructions: Apply over primary dressing as directed. Secondary Dressing: ABD Pad, 8x10 1 x Per Day/30 Days Discharge Instructions: Apply over primary dressing as directed. Secured With: Elastic Bandage 4 inch (ACE bandage) 1 x Per Day/30 Days Discharge Instructions: Secure with ACE bandage as directed. [place], [name] ([id number]) [id number] Page 5 of 9 Secured With: [employer], 4.5x3.1 (in/yd) 1 x Per Day/30 Days Discharge Instructions: Secure with Kerlix as directed. Secured With: 69M Medipore H Soft Cloth Surgical T ape, 4 x 10 (in/yd) 1 x Per Day/30 Days Discharge Instructions: Secure with tape as directed. Electronic Signature(s) Signed: 02/09/2022  11:31:20 AM By: Fredirick Maudlin MD FACS Entered By: Fredirick Maudlin on 02/09/2022 08:38:53 -------------------------------------------------------------------------------- Problem List Details Patient Name: Date of Service: Samuel Pigg MA R 02/09/2022 7:45 A M Medical Record Number: [id number] Patient Account Number: [id number] Date of Birth/Sex: Treating RN: [birth year] (69 y.Samuel. 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 02/02/2022 No Yes without evidence of necrosis L97.522 Non-pressure chronic ulcer of other part of left foot with fat layer exposed 02/02/2022 No Yes E11.621 Type 2 diabetes mellitus with foot ulcer 02/02/2022 No Yes Z89.439 Acquired absence of unspecified foot 02/02/2022 No Yes Inactive Problems Resolved Problems Electronic Signature(s) Signed: 02/09/2022 8:33:55 AM By: Fredirick Maudlin MD FACS Entered By: Fredirick Maudlin on  02/09/2022 08:33:55 -------------------------------------------------------------------------------- Progress Note Details Patient Name: Date of Service: Samuel Pigg MA R 02/09/2022 7:45 A M Medical Record Number: [id number] Patient Account Number: [id number] Date of Birth/Sex: Treating RN: [birth year] (13 y.Samuel. 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 The visit today was conducted with the assistance of the language line interpreter; the patient declines the use of an in person interpreter. He is a poorly controlled diabetic (last hemoglobin A1c 10.8, but it has been as high as 15 in the past) with CKD stage III and hypertension. 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 He required revision of the right TMA in October. This was done in the [place] system. It is not entirely clear how he was referred to our clinic however he has openings on the distal portion of the right foot as well as drainage coming from the plantar aspect of his left foot underneath some callus. The patient has just been applying dry gauze to both areas. 02/09/2022: The left-sided wound is quite a bit smaller today, but is covered with callus. The right sided wound has also contracted a little bit and is very clean, but there is also callus accumulation around the perimeter. Unfortunately, due to his Medicaid status, we have been unable to secure home health assistance and he has been changing the dressings on his own. He also has difficulty obtaining dressing supplies. Patient History Family History Unknown History, Cancer, No family history of Diabetes, Heart Disease, Hereditary Spherocytosis, Hypertension, Kidney Disease, Lung Disease, Seizures, Stroke, Thyroid Problems, Tuberculosis. Social History Never smoker, Marital Status - Married, Alcohol Use - Never, Drug Use - No History, Caffeine Use - Rarely. Medical History Hematologic/Lymphatic Patient has history of Lymphedema Cardiovascular Patient has history of Hypertension Endocrine Patient has history of Type II Diabetes Neurologic Patient has  history of Neuropathy Medical A Surgical History Notes nd Genitourinary AKI Objective Constitutional no acute distress. Vitals Time Taken: 7:54 AM, Height: 72 in, Weight: 214 lbs, BMI: 29, Temperature: 98.5 F, Pulse: 83 bpm, Respiratory Rate: 16 breaths/min, Blood Pressure: 131/83 mmHg, Capillary Blood Glucose: 95 mg/dl. Respiratory Normal work of breathing on room air. General Notes: 02/09/2022: The left-sided wound is quite a bit smaller today, but is covered with callus. The right sided wound has also contracted a little bit and is very clean, but there is also callus accumulation around the perimeter. Integumentary (Hair, Skin) Wound #1 status is Open. Original cause of wound was Surgical Injury. The date acquired was: 02/01/2022. The wound has been in treatment 1 weeks. The wound is located on the Left Amputation Site - Transmetatarsal. The wound measures 0.8cm length x 0.3cm width x 0.1cm depth; 0.188cm^2 area and 0.019cm^3 volume. There is a medium amount of serous drainage noted. Wound #1 status is Open. Original cause of wound was Surgical Injury. The date acquired was: 02/01/2022. The wound has been in treatment 1 weeks. The wound is located on the Left Amputation Site - Transmetatarsal. The wound measures 0.8cm length x 0.3cm width x  0.1cm depth; 0.188cm^2 area and 0.019cm^3 volume. There is no tunneling or undermining noted. There is a medium amount of serous drainage noted. There is large (67-100%) pink granulation within the wound bed. There is a small (1-33%) amount of necrotic tissue within the wound bed. The periwound skin appearance had no abnormalities noted for color. The periwound skin appearance exhibited: Callus, Dry/Scaly. The periwound has tenderness on palpation. Wound #2 status is Open. Original cause of wound was Surgical Injury. The date acquired was: 10/30/2021. The wound has been in treatment 1 weeks. The wound is located on the Right Amputation Site -  Transmetatarsal. The wound measures 2cm length x 2cm width x 0.1cm depth; 3.142cm^2 area and 0.314cm^3 volume. There is no tunneling or undermining noted. There is a medium amount of serous drainage noted. There is medium (34-66%) red, pink granulation within [place], [place] ([id number]) [id number].pdf Page 7 of 9 the wound bed. There is a medium (34-66%) amount of necrotic tissue within the wound bed including Adherent Slough. The periwound skin appearance had no abnormalities noted for color. The periwound skin appearance exhibited: Callus, Maceration. The periwound skin appearance did not exhibit: Dry/Scaly. Periwound temperature was noted as No Abnormality. 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 There was a Selective/Open Wound Skin/Epidermis Debridement with a total area of 0.24 sq cm performed by Fredirick Maudlin, MD. With the following instrument(s): Curette to remove Non-Viable tissue/material. Material removed includes Callus and Skin: Epidermis and after achieving pain control using Lidocaine 5% topical ointment. No specimens were taken. A time out was conducted at 08:15, prior to the start of the procedure. A Minimum amount of bleeding was controlled with Pressure. The procedure was tolerated well. Post Debridement Measurements: 0.8cm length x 0.3cm width x 0.1cm depth; 0.019cm^3 volume. Character of Wound/Ulcer Post Debridement requires further debridement. Severity of Tissue Post Debridement is:  Fat layer exposed. Post procedure Diagnosis Wound #1: Same as Pre-Procedure General Notes: Scribed for Dr. Celine Ahr by Blanche East, RN. Wound #2 Pre-procedure diagnosis of Wound #2 is a Diabetic Wound/Ulcer of the Lower Extremity located on the Right Amputation Site - Transmetatarsal .Severity of Tissue Pre Debridement is: Fat layer exposed. There was a Selective/Open Wound Skin/Epidermis Debridement with a total area of 4 sq cm performed by Fredirick Maudlin, MD. With the following instrument(s): Curette to remove Non-Viable tissue/material. Material removed includes Callus, Slough, and Skin: Epidermis after achieving pain control using Lidocaine 5% topical ointment. No specimens were taken. A time out was conducted at 08:15, prior to the start of the procedure. A Minimum amount of bleeding was controlled with Pressure. The procedure was tolerated well. Post Debridement Measurements: 2cm length x 2cm width x 0.1cm depth; 0.314cm^3 volume. Character of Wound/Ulcer Post Debridement requires further debridement. Severity of Tissue Post Debridement is: Fat layer exposed. Post procedure Diagnosis Wound #2: Same as Pre-Procedure General Notes: Scribed for Dr. Celine Ahr by Blanche East, RN. Plan Follow-up Appointments: Return Appointment in 1 week. - Dr. Celine Ahr Anesthetic: Wound #1 Left Amputation Site - Transmetatarsal: (In clinic) Topical Lidocaine 5% applied to wound bed Off-Loading: Wound #1 Left  Amputation Site - Transmetatarsal: DH Walker Boot to: Wound #2 Right Amputation Site - Transmetatarsal: DH Walker Boot to: Home Health: Admit to [place] for skilled nursing wound care. May utilize formulary equivalent dressing for wound treatment orders unless otherwise specified. - Skilled nursing for wound care Dressing changes to be completed by [place] on Tuesday / Thursday / Saturday except when patient has scheduled visit at [facility]. WOUND #1: - Amputation Site - Transmetatarsal  Wound Laterality: Left Cleanser: Soap and Water 1 x Per Day/30 Days Discharge Instructions: May shower and wash wound with dial antibacterial soap and water prior to dressing change. Peri-Wound Care: Sween Lotion (Moisturizing lotion) 1 x Per Day/30 Days Discharge Instructions: Apply moisturizing lotion as directed Prim Dressing: Sorbalgon AG Dressing, 4x4 (in/in) 1 x Per Day/30 Days ary Discharge Instructions: Apply to wound bed as instructed Secondary Dressing: ALLEVYN Heel 4 1/2in x 5 1/2in / 10.5cm x 13.5cm 1 x Per Day/30 Days Discharge Instructions: Apply over primary dressing as directed. Secondary Dressing: ABD Pad, 8x10 1 x Per Day/30 Days Discharge Instructions: Apply over primary dressing as directed. Secured With: Elastic Bandage 4 inch (ACE bandage) 1 x Per Day/30 Days Discharge Instructions: Secure with ACE bandage as directed. Secured With: [employer], 4.5x3.1 (in/yd) 1 x Per Day/30 Days Discharge Instructions: Secure with Kerlix as directed. Secured With: 514M Medipore H Soft Cloth Surgical T ape, 4 x 10 (in/yd) 1 x Per Day/30 Days Discharge Instructions: Secure with tape as directed. WOUND #2: - Amputation Site - Transmetatarsal Wound Laterality: Right Cleanser: Soap and Water 1 x Per Day/30 Days Discharge Instructions: May shower and wash wound with dial antibacterial soap and water prior to dressing change. Peri-Wound Care: Sween Lotion (Moisturizing lotion) 1 x Per Day/30 Days Discharge Instructions: Apply moisturizing lotion as directed [name], [name] ([id number]) [id number] Page 8 of 9 Prim Dressing: Sorbalgon AG Dressing, 4x4 (in/in) 1 x Per Day/30 Days ary Discharge Instructions: Apply to wound bed as instructed Secondary Dressing: ALLEVYN Heel 4 1/2in x 5 1/2in / 10.5cm x 13.5cm 1 x Per Day/30 Days Discharge Instructions: Apply over primary dressing as directed. Secondary Dressing: ABD Pad, 8x10 1 x Per Day/30 Days Discharge  Instructions: Apply over primary dressing as directed. Secured With: Elastic Bandage 4 inch (ACE bandage) 1 x Per Day/30 Days Discharge Instructions: Secure with ACE bandage as directed. Secured With: [employer], 4.5x3.1 (in/yd) 1 x Per Day/30 Days Discharge Instructions: Secure with Kerlix as directed. Secured With: 514M Medipore H Soft Cloth Surgical T ape, 4 x 10 (in/yd) 1 x Per Day/30 Days Discharge Instructions: Secure with tape as directed. 02/09/2022: The left-sided wound is quite a bit smaller today, but is covered with callus. The right sided wound has also contracted a little bit and is very clean, but there is also callus accumulation around the perimeter. I used a curette to debride callus and slough from the left and callus skin and slough from the right. We will continue silver alginate and use heel cups to pad the ends of his bilateral TMA sites. We are having him fill out hardship application forms for [employer] to see if we can get some assistance with his wound care supplies. He continues on doxycycline prescribed by the [facility] clinic for his presumptive diagnosis of osteomyelitis. Follow-up in 1 week. Electronic Signature(s) Signed: 02/09/2022 8:40:43 AM By: Fredirick Maudlin MD FACS Entered By: Fredirick Maudlin on 02/09/2022 08:40:43 -------------------------------------------------------------------------------- HxROS Details Patient Name: Date of Service: Samuel SMA  Rollene Rotunda MA R 02/09/2022 7:45 A M Medical Record Number: [id number] Patient Account Number: [id number] Date of Birth/Sex: Treating RN: Jan 20, 1966 (63 y.Samuel. M) Primary Care Provider: Nani Gasser Other Clinician: Referring Provider: Treating Provider/Extender: [place] in Treatment: 1 Hematologic/Lymphatic Medical History: Positive for: Lymphedema Cardiovascular Medical History: Positive for: Hypertension Endocrine Medical History: Positive for: Type II  Diabetes Treated with: Insulin, Oral agents Genitourinary Medical History: Past Medical History Notes: AKI Neurologic Medical History: Positive for: Neuropathy Immunizations Pneumococcal Vaccine: Received Pneumococcal Vaccination: Yes Received Pneumococcal Vaccination On or After [address]el Hernandez, [name] ([id number]) [id number] Page 9 of 9 Implantable Devices No devices added Family and Social History Unknown History: Yes; Cancer: Yes; Diabetes: No; Heart Disease: No; Hereditary Spherocytosis: No; Hypertension: No; Kidney Disease: No; Lung Disease: No; Seizures: No; Stroke: No; Thyroid Problems: No; Tuberculosis: No; Never smoker; Marital Status - Married; Alcohol Use: Never; Drug Use: No History; Caffeine Use: Rarely; Financial Concerns: No; Food, Clothing or Shelter Needs: No; Support System Lacking: No; Transportation Concerns: No Electronic Signature(s) Signed: 02/09/2022 11:31:20 AM By: Fredirick Maudlin MD FACS Entered By: Fredirick Maudlin on 02/09/2022 08:38:01 -------------------------------------------------------------------------------- SuperBill Details Patient Name: Date of Service: Samuel Pigg MA R 02/09/2022 Medical Record Number: [id number] Patient Account Number: [id number] Date of Birth/Sex: Treating RN: 1965/05/02 (19 y.Samuel. 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 E11.621 Type 2 diabetes mellitus with foot ulcer Z89.439 Acquired absence of unspecified foot Facility Procedures : CPT4 Code: [id number] Description: 85885 - WOUND CARE VISIT-LEV 2 EST PT Modifier:  25 Quantity: 1 : CPT4 Code: [id number] Description: 78676 - DEBRIDE WOUND 1ST 20 SQ CM OR < ICD-10 Diagnosis Description 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 wit L97.522 Non-pressure chronic ulcer of other part of left foot with fat layer  exposed Modifier: hout evidence of n Quantity: 1 ecrosis Physician Procedures : CPT4 Code Description Modifier [id number] 96283 - WC PHYS LEVEL 4 - EST PT 25 ICD-10 Diagnosis Description 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 without evidence of ne L97.522 Non-pressure chronic ulcer of  other part of left foot with fat layer exposed E11.621 Type 2 diabetes mellitus with foot ulcer Z89.439 Acquired absence of unspecified foot Quantity: 1 crosis : [id number] - WC PHYS DEBR WO ANESTH 20 SQ CM ICD-10 Diagnosis Description 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

## 2022-02-10 NOTE — Progress Notes (Signed)
Meeker, [name] ([id number]) [id number] Page 1 of 12 Visit Report for 02/09/2022 Arrival Information Details Patient Name: Date of Service: Samuel Pigg MA R 02/09/2022 7:45 A M Medical Record Number: [id number] Patient Account Number: [id number] Date of Birth/Sex: Treating RN: 1965-07-12 (57 y.o. Waldron Session Primary Care Chessica Audia: Nani Gasser Other Clinician: Referring Devrin Monforte: Treating Eligah Anello/Extender: [place] in Treatment: 1 Visit Information History Since Last Visit Added or deleted any medications: No Patient Arrived: Wheel Chair Any new allergies or adverse reactions: No Arrival Time: 07:48 Had a fall or experienced change in No Accompanied By: wife activities of daily living that may affect Transfer Assistance: Manual risk of falls: Patient Identification Verified: Yes Signs or symptoms of abuse/neglect since last visito No Secondary Verification Process Completed: Yes Hospitalized since last visit: No Patient Requires Transmission-Based Precautions: No Implantable device outside of the clinic excluding No cellular tissue based products placed in the center since last visit: Has Dressing in Place as Prescribed: Yes Pain Present Now: Yes Electronic Signature(s) Signed: 02/09/2022 4:39:48 PM By: Blanche East RN Entered By: Blanche East on 02/09/2022 07:54:50 -------------------------------------------------------------------------------- Clinic Level of Care Assessment Details Patient Name: Date of Service: Samuel Pigg MA R 02/09/2022 7:45 A M Medical Record Number: [id number] Patient Account Number: [id number] Date of Birth/Sex: Treating RN: [birth year] (57 y.o. Hessie Diener Primary Care Michon Kaczmarek: Nani Gasser Other Clinician: Referring Tashi Band: Treating Lulamae Skorupski/Extender: [place] in Treatment: 1 Clinic Level of Care Assessment Items TOOL 3 Quantity Score X- 1  0 Use when EandM and Procedure is performed on FOLLOW-UP visit ASSESSMENTS - Nursing Assessment / Reassessment X- 1 10 Reassessment of Co-morbidities (includes updates in patient status) X- 1 5 Reassessment of Adherence to Treatment Plan ASSESSMENTS - Wound and Skin Assessment / Reassessment '[]'$  - Points for Wound Assessment can only be taken for a new wound of unknown or different etiology and a procedure is 0 NOT performed to that wound '[]'$  - 0 Simple Wound Assessment / Reassessment - one wound '[]'$  - 0 Complex Wound Assessment / Reassessment - multiple wounds '[]'$  - 0 Dermatologic / Skin Assessment (not related to wound area) ASSESSMENTS - Focused Assessment X- 1 5 Circumferential Edema Measurements - multi extremities [place], [name] ([id number]) [id number] Page 2 of 12 '[]'$  - 0 Nutritional Assessment / Counseling / Intervention '[]'$  - 0 Lower Extremity Assessment (monofilament, tuning fork, pulses) '[]'$  - 0 Peripheral Arterial Disease Assessment (using hand held doppler) ASSESSMENTS - Ostomy and/or Continence Assessment and Care '[]'$  - 0 Incontinence Assessment and Management '[]'$  - 0 Ostomy Care Assessment and Management (repouching, etc.) PROCESS - Coordination of Care '[]'$  - Points for Discharge Coordination can only be taken for a new wound of unknown or different etiology and a procedure 0 is NOT performed to that wound '[]'$  - 0 Simple Patient / Family Education for ongoing care '[]'$  - 0 Complex (extensive) Patient / Family Education for ongoing care '[]'$  - 0 Staff obtains Programmer, systems, Records, T Results / Process Orders est '[]'$  - 0 Staff telephones HHA, Nursing Homes / Clarify orders / etc '[]'$  - 0 Routine Transfer to another Facility (non-emergent condition) '[]'$  - 0 Routine Hospital Admission (non-emergent condition) '[]'$  - 0 New Admissions / Biomedical engineer / Ordering NPWT Apligraf, etc. , '[]'$  - 0 Emergency Hospital Admission (emergent condition) '[]'$  -  0 Simple Discharge Coordination '[]'$  - 0 Complex (extensive) Discharge Coordination PROCESS - Special Needs '[]'$  - 0 Pediatric / Minor Patient Management '[]'$  -  0 Isolation Patient Management X- 1 15 Hearing / Language / Visual special needs '[]'$  - 0 Assessment of Community assistance (transportation, D/C planning, etc.) '[]'$  - 0 Additional assistance / Altered mentation '[]'$  - 0 Support Surface(s) Assessment (bed, cushion, seat, etc.) INTERVENTIONS - Wound Cleansing / Measurement '[]'$  - Points for Wound Cleaning / Measurement, Wound Dressing, Specimen Collection and Specimen taken to lab can only 0 be taken for a new wound of unknown or different etiology and a procedure is NOT performed to that wound '[]'$  - 0 Simple Wound Cleansing - one wound '[]'$  - 0 Complex Wound Cleansing - multiple wounds '[]'$  - 0 Wound Imaging (photographs - any number of wounds) '[]'$  - 0 Wound Tracing (instead of photographs) '[]'$  - 0 Simple Wound Measurement - one wound '[]'$  - 0 Complex Wound Measurement - multiple wounds INTERVENTIONS - Wound Dressings X - Small Wound Dressing one or multiple wounds 2 10 '[]'$  - 0 Medium Wound Dressing one or multiple wounds '[]'$  - 0 Large Wound Dressing one or multiple wounds INTERVENTIONS - Miscellaneous '[]'$  - 0 External ear exam '[]'$  - 0 Specimen Collection (cultures, biopsies, blood, body fluids, etc.) '[]'$  - 0 Specimen(s) / Culture(s) sent or taken to Lab for analysis '[]'$  - 0 Patient Transfer (multiple staff / Harrel Lemon Lift / Similar devices) '[]'$  - 0 Simple Staple / Suture removal (25 or less) Hammac, Mouhamadou ([id number]) [id number] Page 3 of 12 '[]'$  - 0 Complex Staple / Suture removal (26 or more) '[]'$  - 0 Hypo / Hyperglycemic Management (close monitor of Blood Glucose) '[]'$  - 0 Ankle / Brachial Index (ABI) - do not check if billed separately X- 1 5 Vital Signs Has the patient been seen at the hospital within the last three years: Yes Total Score: 60 Level Of  Care: New/Established - Level 2 Electronic Signature(s) Signed: 02/09/2022 3:19:36 PM By: Deon Pilling RN, BSN Entered By: Deon Pilling on 02/09/2022 09:00:15 -------------------------------------------------------------------------------- Encounter Discharge Information Details Patient Name: Date of Service: Samuel Pigg MA R 02/09/2022 7:45 A M Medical Record Number: [id number] Patient Account Number: [id number] Date of Birth/Sex: Treating RN: March 05, 1965 (57 y.o. Waldron Session Primary Care Khalifa Knecht: Nani Gasser Other Clinician: Referring Arnelle Nale: Treating Zyan Mirkin/Extender: [place] in Treatment: 1 Encounter Discharge Information Items Post Procedure Vitals Discharge Condition: Stable Temperature (F): 98.5 Ambulatory Status: Wheelchair Pulse (bpm): 83 Discharge Destination: Home Respiratory Rate (breaths/min): 16 Transportation: Private Auto Blood Pressure (mmHg): 131/83 Accompanied By: wife Schedule Follow-up Appointment: Yes Clinical Summary of Care: Electronic Signature(s) Signed: 02/09/2022 4:39:48 PM By: Blanche East RN Entered By: Blanche East on 02/09/2022 08:28:07 -------------------------------------------------------------------------------- Lower Extremity Assessment Details Patient Name: Date of Service: Samuel Pigg MA R 02/09/2022 7:45 A M Medical Record Number: [id number] Patient Account Number: [id number] Date of Birth/Sex: Treating RN: December 01, 1965 (57 y.o. Waldron Session Primary Care Marit Goodwill: Nani Gasser Other Clinician: Referring Madylin Fairbank: Treating Caridad Silveira/Extender: [place] in Treatment: 1 Edema Assessment Assessed: Shirlyn Goltz: No] [Right: No] [Left: Edema] [Right: :] Calf Left: Right: Point of Measurement: From Medial Instep 36 cm 35 cm Ankle Left: Right: Point of Measurement: From Medial Instep 24 cm 23.5 cm Hennick, Taiga ([id number]) [id number].pdf Page  4 of 12 Vascular Assessment Pulses: Dorsalis Pedis Palpable: [Left:Yes] [Right:Yes] Electronic Signature(s) Signed: 02/09/2022 4:39:48 PM By: Blanche East RN Entered By: Blanche East on 02/09/2022 08:01:27 -------------------------------------------------------------------------------- Multi Wound Chart Details Patient Name: Date of Service: Samuel Pigg MA R 02/09/2022 7:45 A M Medical Record  Number: [id number] Patient Account Number: [id number] Date of Birth/Sex: Treating RN: 1965-02-16 (57 y.o. M) Primary Care Jesusita Jocelyn: Nani Gasser Other Clinician: Referring Jearlene Bridwell: Treating Gedalya Jim/Extender: [place] in Treatment: 1 Vital Signs Height(in): 53 Capillary Blood Glucose(mg/dl): 95 Weight(lbs): 214 Pulse(bpm): 62 Body Mass Index(BMI): 29 Blood Pressure(mmHg): 131/83 Temperature(F): 98.5 Respiratory Rate(breaths/min): 16 [1:Photos:] Left Amputation Site - Transmetatarsal Left Amputation Site - Transmetatarsal Right Amputation Site - Wound Location: Transmetatarsal Surgical Injury Surgical Injury Surgical Injury Wounding Event: Diabetic Wound/Ulcer of the Lower Diabetic Wound/Ulcer of the Lower Diabetic Wound/Ulcer of the Lower Primary Etiology: Extremity Extremity Extremity Lymphedema, Hypertension, Type II Lymphedema, Hypertension, Type II Lymphedema, Hypertension, Type II Comorbid History: Diabetes, Neuropathy Diabetes, Neuropathy Diabetes, Neuropathy 02/01/2022 02/01/2022 10/30/2021 Date Acquired: '1 1 1 '$ Weeks of Treatment: Open Open Open Wound Status: No No No Wound Recurrence: 0.8x0.3x0.1 0.8x0.3x0.1 2x2x0.1 Measurements L x W x D (cm) 0.188 0.188 3.142 A (cm) : rea 0.019 0.019 0.314 Volume (cm) : 88.00% 88.00% 74.60% % Reduction in A rea: 87.90% 87.90% 74.60% % Reduction in Volume: Grade 1 Grade 1 Grade 1 Classification: Medium Medium Medium Exudate A mount: Serous Serous Serous Exudate Type: amber amber  amber Exudate Color: N/A Large (67-100%) Medium (34-66%) Granulation A mount: N/A Pink Red, Pink Granulation Quality: N/A Small (1-33%) Medium (34-66%) Necrotic A mount: N/A N/A Small (1-33%) Epithelialization: Debridement - Selective/Open Wound Debridement - Selective/Open Wound Debridement - Selective/Open Wound Debridement: Pre-procedure Verification/Time Out 08:15 08:15 08:15 Taken: Lidocaine 5% topical ointment Lidocaine 5% topical ointment Lidocaine 5% topical ointment Pain Control: Callus Callus Callus, Slough Tissue Debrided: Skin/Epidermis Skin/Epidermis Skin/Epidermis Level: 0.24 0.24 4 Debridement A (sq cm): rea [name] ([id number]) [id number] Page 5 of 12 Curette Curette Curette Instrument: Minimum Minimum Minimum Bleeding: Pressure Pressure Pressure Hemostasis Achieved: Procedure was tolerated well Procedure was tolerated well Procedure was tolerated well Debridement Treatment Response: 0.8x0.3x0.1 0.8x0.3x0.1 2x2x0.1 Post Debridement Measurements L x W x D (cm) 0.019 0.019 0.314 Post Debridement Volume: (cm) Callus: Yes Callus: Yes Periwound Skin Texture: Dry/Scaly: Yes Maceration: Yes Periwound Skin Moisture: Dry/Scaly: No No Abnormalities Noted No Abnormalities Noted Periwound Skin Color: N/A N/A No Abnormality Temperature: N/A Yes N/A Tenderness on Palpation: Debridement Debridement Debridement Procedures Performed: Treatment Notes Wound #1 (Amputation Site - Transmetatarsal) Wound Laterality: Left Cleanser Soap and Water Discharge Instruction: May shower and wash wound with dial antibacterial soap and water prior to dressing change. Peri-Wound Care Sween Lotion (Moisturizing lotion) Discharge Instruction: Apply moisturizing lotion as directed Topical Primary Dressing Sorbalgon AG Dressing, 4x4 (in/in) Discharge Instruction: Apply to wound bed as instructed Secondary Dressing ALLEVYN Heel 4 1/2in x 5  1/2in / 10.5cm x 13.5cm Discharge Instruction: Apply over primary dressing as directed. ABD Pad, 8x10 Discharge Instruction: Apply over primary dressing as directed. Secured With Elastic Bandage 4 inch (ACE bandage) Discharge Instruction: Secure with ACE bandage as directed. Kerlix Roll Sterile, 4.5x3.1 (in/yd) Discharge Instruction: Secure with Kerlix as directed. 32M Medipore H Soft Cloth Surgical T ape, 4 x 10 (in/yd) Discharge Instruction: Secure with tape as directed. Compression Wrap Compression Stockings Add-Ons Wound #2 (Amputation Site - Transmetatarsal) Wound Laterality: Right Cleanser Soap and Water Discharge Instruction: May shower and wash wound with dial antibacterial soap and water prior to dressing change. Peri-Wound Care Sween Lotion (Moisturizing lotion) Discharge Instruction: Apply moisturizing lotion as directed Topical Primary Dressing Sorbalgon AG Dressing, 4x4 (in/in) Discharge Instruction: Apply to wound bed as instructed Secondary Dressing ALLEVYN Heel 4 1/2in x 5 1/2in / 10.5cm x 13.5cm  Discharge Instruction: Apply over primary dressing as directed. ABD Pad, 8x10 Discharge Instruction: Apply over primary dressing as directed. [place], [name] ([id number]) [id number] Page 6 of 12 Secured With Elastic Bandage 4 inch (ACE bandage) Discharge Instruction: Secure with ACE bandage as directed. Kerlix Roll Sterile, 4.5x3.1 (in/yd) Discharge Instruction: Secure with Kerlix as directed. 78M Medipore H Soft Cloth Surgical T ape, 4 x 10 (in/yd) Discharge Instruction: Secure with tape as directed. Compression Wrap Compression Stockings Add-Ons Electronic Signature(s) Signed: 02/09/2022 8:34:16 AM By: Fredirick Maudlin MD FACS Entered By: Fredirick Maudlin on 02/09/2022 08:34:16 -------------------------------------------------------------------------------- Multi-Disciplinary Care Plan Details Patient Name: Date of Service: Samuel Pigg MA R  02/09/2022 7:45 A M Medical Record Number: [id number] Patient Account Number: [id number] Date of Birth/Sex: Treating RN: 1965-09-06 (57 y.o. Waldron Session Primary Care Makila Colombe: Nani Gasser Other Clinician: Referring Ivan Lacher: Treating Mcgregor Tinnon/Extender: [place] in Treatment: 1 Active Inactive Nutrition Nursing Diagnoses: Impaired glucose control: actual or potential Goals: Patient/caregiver verbalizes understanding of need to maintain therapeutic glucose control per primary care physician Date Initiated: 02/02/2022 Target Resolution Date: 03/29/2022 Goal Status: Active Interventions: Assess patient nutrition upon admission and as needed per policy Provide education on elevated blood sugars and impact on wound healing Treatment Activities: Dietary management education, guidance and counseling : 02/02/2022 Notes: Orientation to the Wound Care Program Nursing Diagnoses: Knowledge deficit related to the wound healing center program Goals: Patient/caregiver will verbalize understanding of the [place] Date Initiated: 02/02/2022 Target Resolution Date: 03/29/2022 Goal Status: Active Interventions: Provide education on orientation to the wound center Notes: Wound/Skin Impairment [place], Taequan ([id number]) [id number] Page 7 of 12 Nursing Diagnoses: Impaired tissue integrity Goals: Ulcer/skin breakdown will have a volume reduction of 30% by week 4 Date Initiated: 02/02/2022 Target Resolution Date: 03/29/2022 Goal Status: Active Interventions: Assess ulceration(s) every visit Provide education on ulcer and skin care Treatment Activities: Patient referred to home care : 02/02/2022 Skin care regimen initiated : 02/02/2022 Notes: Electronic Signature(s) Signed: 02/09/2022 4:39:48 PM By: Blanche East RN Entered By: Blanche East on 02/09/2022  08:26:22 -------------------------------------------------------------------------------- Pain Assessment Details Patient Name: Date of Service: Samuel Pigg MA R 02/09/2022 7:45 A M Medical Record Number: [id number] Patient Account Number: [id number] Date of Birth/Sex: Treating RN: [birth year] (57 y.o. Waldron Session Primary Care Kariss Longmire: Nani Gasser Other Clinician: Referring Treylin Burtch: Treating Milledge Gerding/Extender: [place] in Treatment: 1 Active Problems Location of Pain Severity and Description of Pain Patient Has Paino No Site Locations Rate the pain. Current Pain Level: 0 Pain Management and Medication Current Pain Management: Electronic Signature(s) Signed: 02/09/2022 4:39:48 PM By: Blanche East RN Entered By: Blanche East on 02/09/2022 07:56:44 [name] ([id number]) [id number] Page 8 of 12 -------------------------------------------------------------------------------- Patient/Caregiver Education Details Patient Name: Date of Service: [name] [place] R 1/12/2024andnbsp7:45 A M Medical Record Number: [id number] Patient Account Number: [id number] Date of Birth/Gender: Treating RN: 08-17-1965 (57 y.o. Waldron Session Primary Care Physician: Nani Gasser Other Clinician: Referring Physician: Treating Physician/Extender: [place] More[place] in Treatment: 1 Education Assessment Education Provided To: Patient Education Topics Provided Wound/Skin Impairment: Methods: Explain/Verbal Responses: Reinforcements needed, State content correctly Electronic Signature(s) Signed: 02/09/2022 4:39:48 PM By: Blanche East RN Entered By: Blanche East on 02/09/2022 08:26:35 -------------------------------------------------------------------------------- Wound Assessment Details Patient Name: Date of Service: Samuel Pigg MA R 02/09/2022 7:45 A M Medical Record Number: [id number] Patient Account  Number: [id number] Date of Birth/Sex: Treating RN: [birth year] (57 y.o. M) Willey Blade, Roselyn Reef  Primary Care Kaci Dillie: Nani Gasser Other Clinician: Referring Jamiah Recore: Treating Folasade Mooty/Extender: [place] in Treatment: 1 Wound Status Wound Number: 1 Primary Etiology: Diabetic Wound/Ulcer of the Lower Extremity Wound Location: Left Amputation Site - Transmetatarsal Wound Status: Open Wounding Event: Surgical Injury Comorbid Lymphedema, Hypertension, Type II Diabetes, Neuropathy History: Date Acquired: 02/01/2022 Weeks Of Treatment: 1 Clustered Wound: No Photos Wound Measurements Length: (cm) 0.8 Width: (cm) 0.3 Decandia, Chadd ([id number]) Depth: (cm) 0.1 Area: (cm) 0.188 Volume: (cm) 0.019 % Reduction in Area: 88% % Reduction in Volume: 87.9% [id number] Page 9 of 12 Wound Description Classification: Exudate Amount: Exudate Type: Exudate Color: Grade 1 Medium Serous amber Periwound Skin Texture Texture Color No Abnormalities Noted: No No Abnormalities Noted: No Moisture No Abnormalities Noted: No Electronic Signature(s) Signed: 02/09/2022 4:39:48 PM By: Blanche East RN Entered By: Blanche East on 02/09/2022 08:04:43 -------------------------------------------------------------------------------- Wound Assessment Details Patient Name: Date of Service: Samuel Pigg MA R 02/09/2022 7:45 A M Medical Record Number: [id number] Patient Account Number: [id number] Date of Birth/Sex: Treating RN: Aug 01, 1965 (57 y.o. Waldron Session Primary Care Margues Filippini: Nani Gasser Other Clinician: Referring Resha Filippone: Treating Ever Gustafson/Extender: [place] in Treatment: 1 Wound Status Wound Number: 1 Primary Etiology: Diabetic Wound/Ulcer of the Lower Extremity Wound Location: Left Amputation Site - Transmetatarsal Wound Status: Open Wounding Event: Surgical Injury Comorbid Lymphedema,  Hypertension, Type II Diabetes, Neuropathy History: Date Acquired: 02/01/2022 Weeks Of Treatment: 1 Clustered Wound: No Photos Wound Measurements Length: (cm) 0.8 Width: (cm) 0.3 Depth: (cm) 0.1 Area: (cm) 0.188 Volume: (cm) 0.019 % Reduction in Area: 88% % Reduction in Volume: 87.9% Tunneling: No Undermining: No Wound Description Classification: Grade Exudate Amount: Mediu Exudate Type: Serou Exudate Color: amber Hatcher, Treshon ([id number]) Wound Bed Granulation Amount: Large (67-10 Granulation Quality: Pink Necrotic Amount: Small (1-33% 1 Foul Odor After Cleansing: No m Slough/Fibrino Yes s [phone].pdf Page 10 of 12 0%) ) Periwound Skin Texture Texture Color No Abnormalities Noted: No No Abnormalities Noted: Yes Callus: Yes Temperature / Pain Tenderness on Palpation: Yes Moisture No Abnormalities Noted: No Dry / Scaly: Yes Treatment Notes Wound #1 (Amputation Site - Transmetatarsal) Wound Laterality: Left Cleanser Soap and Water Discharge Instruction: May shower and wash wound with dial antibacterial soap and water prior to dressing change. Peri-Wound Care Sween Lotion (Moisturizing lotion) Discharge Instruction: Apply moisturizing lotion as directed Topical Primary Dressing Sorbalgon AG Dressing, 4x4 (in/in) Discharge Instruction: Apply to wound bed as instructed Secondary Dressing ALLEVYN Heel 4 1/2in x 5 1/2in / 10.5cm x 13.5cm Discharge Instruction: Apply over primary dressing as directed. ABD Pad, 8x10 Discharge Instruction: Apply over primary dressing as directed. Secured With Elastic Bandage 4 inch (ACE bandage) Discharge Instruction: Secure with ACE bandage as directed. Kerlix Roll Sterile, 4.5x3.1 (in/yd) Discharge Instruction: Secure with Kerlix as directed. 75M Medipore H Soft Cloth Surgical T ape, 4 x 10 (in/yd) Discharge Instruction: Secure with tape as directed. Compression Wrap Compression  Stockings Add-Ons Electronic Signature(s) Signed: 02/09/2022 4:39:48 PM By: Blanche East RN Entered By: Blanche East on 02/09/2022 08:09:43 -------------------------------------------------------------------------------- Wound Assessment Details Patient Name: Date of Service: Samuel Pigg MA R 02/09/2022 7:45 A M Medical Record Number: [id number] Patient Account Number: [id number] Date of Birth/Sex: Treating RN: 22-Jun-1965 (57 y.o. Waldron Session Primary Care Dink Creps: Nani Gasser Other Clinician: Referring Pedro Whiters: Treating Mykalah Saari/Extender: [place] in Treatment: 1 Wound Status [place], [name] ([id number]) [id number] Page 11 of 12 Wound Number: 2 Primary Etiology: Diabetic Wound/Ulcer of the  Lower Extremity Wound Location: Right Amputation Site - Transmetatarsal Wound Status: Open Wounding Event: Surgical Injury Comorbid Lymphedema, Hypertension, Type II Diabetes, Neuropathy History: Date Acquired: 10/30/2021 Weeks Of Treatment: 1 Clustered Wound: No Photos Wound Measurements Length: (cm) 2 Width: (cm) 2 Depth: (cm) 0.1 Area: (cm) 3.142 Volume: (cm) 0.314 % Reduction in Area: 74.6% % Reduction in Volume: 74.6% Epithelialization: Small (1-33%) Tunneling: No Undermining: No Wound Description Classification: Grade 1 Exudate Amount: Medium Exudate Type: Serous Exudate Color: amber Foul Odor After Cleansing: No Slough/Fibrino Yes Wound Bed Granulation Amount: Medium (34-66%) Exposed Structure Granulation Quality: Red, Pink Fascia Exposed: No Necrotic Amount: Medium (34-66%) Necrotic Quality: Adherent Slough Periwound Skin Texture Texture Color No Abnormalities Noted: No No Abnormalities Noted: Yes Callus: Yes Temperature / Pain Temperature: No Abnormality Moisture No Abnormalities Noted: No Dry / Scaly: No Maceration: Yes Treatment Notes Wound #2 (Amputation Site - Transmetatarsal) Wound  Laterality: Right Cleanser Soap and Water Discharge Instruction: May shower and wash wound with dial antibacterial soap and water prior to dressing change. Peri-Wound Care Sween Lotion (Moisturizing lotion) Discharge Instruction: Apply moisturizing lotion as directed Topical Primary Dressing Sorbalgon AG Dressing, 4x4 (in/in) Discharge Instruction: Apply to wound bed as instructed Secondary Dressing ALLEVYN Heel 4 1/2in x 5 1/2in / 10.5cm x 13.5cm Discharge Instruction: Apply over primary dressing as directed. ABD Pad, 8x10 Discharge Instruction: Apply over primary dressing as directed. [place], [name] ([id number]) [id number] Page 12 of 12 Secured With Elastic Bandage 4 inch (ACE bandage) Discharge Instruction: Secure with ACE bandage as directed. Kerlix Roll Sterile, 4.5x3.1 (in/yd) Discharge Instruction: Secure with Kerlix as directed. 42M Medipore H Soft Cloth Surgical T ape, 4 x 10 (in/yd) Discharge Instruction: Secure with tape as directed. Compression Wrap Compression Stockings Add-Ons Electronic Signature(s) Signed: 02/09/2022 4:39:48 PM By: Blanche East RN Entered By: Blanche East on 02/09/2022 08:05:09 -------------------------------------------------------------------------------- Vitals Details Patient Name: Date of Service: Samuel Pigg MA R 02/09/2022 7:45 A M Medical Record Number: [id number] Patient Account Number: [id number] Date of Birth/Sex: Treating RN: 1965/07/26 (57 y.o. Waldron Session Primary Care Kristofer Schaffert: Nani Gasser Other Clinician: Referring Anjolina Byrer: Treating Kamarri Lovvorn/Extender: [place] in Treatment: 1 Vital Signs Time Taken: 07:54 Temperature (F): 98.5 Height (in): 72 Pulse (bpm): 83 Weight (lbs): 214 Respiratory Rate (breaths/min): 16 Body Mass Index (BMI): 29 Blood Pressure (mmHg): 131/83 Capillary Blood Glucose (mg/dl): 95 Reference Range: 80 - 120 mg / dl Electronic  Signature(s) Signed: 02/09/2022 4:39:48 PM By: Blanche East RN Entered By: Blanche East on 02/09/2022 07:56:13

## 2022-02-13 ENCOUNTER — Encounter: Payer: Self-pay | Admitting: Gastroenterology

## 2022-02-19 ENCOUNTER — Encounter (HOSPITAL_BASED_OUTPATIENT_CLINIC_OR_DEPARTMENT_OTHER): Payer: Medicaid Other | Admitting: General Surgery

## 2022-02-19 DIAGNOSIS — E11621 Type 2 diabetes mellitus with foot ulcer: Secondary | ICD-10-CM | POA: Diagnosis not present

## 2022-02-19 NOTE — Progress Notes (Signed)
[place], [name] ([id number]) [id number] Page 1 of 10 Visit Report for 02/19/2022 Chief Complaint Document Details Patient Name: Date of Service: Samuel Pigg MA R 02/19/2022 3:00 PM Medical Record Number: [id number] Patient Account Number: [id number] Date of Birth/Sex: Treating RN: May 03, 1965 (57 y.o. 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 Collene Gobble Primary Care Provider: Nani Gasser Other Clinician: Referring Provider: Treating Provider/Extender: [place] More[place] in Treatment: 2 Debridement Performed for Assessment: Wound #2 Right Amputation Site - Transmetatarsal Performed By: Physician Fredirick Maudlin, MD Debridement Type: Debridement Severity of Tissue Pre Debridement: Fat layer exposed Level of Consciousness (Pre-procedure): Awake and Alert Pre-procedure Verification/Time Out Yes - 15:35 Taken: Start Time: 15:35 Pain Control: Lidocaine 5% topical ointment T Area Debrided (L x W): otal 2 (cm) x 2 (cm) = 4 (cm) Tissue and other material debrided: Non-Viable, Callus, Slough, Slough Level: Non-Viable Tissue Debridement  Description: Selective/Open Wound Instrument: Curette Bleeding: Minimum Hemostasis Achieved: Pressure End Time: 15:37 Procedural Pain: 0 Post Procedural Pain: 0 Response to Treatment: Procedure was tolerated well Level of Consciousness (Post- Awake and Alert procedure): Post Debridement Measurements of Total Wound Length: (cm) 2 Width: (cm) 2 Depth: (cm) 0.1 Volume: (cm) 0.314 Character of Wound/Ulcer Post Debridement: Improved [name] ([id number]) [id number] Page 2 of 10 Severity of Tissue Post Debridement: Fat layer exposed Post Procedure Diagnosis Same as Pre-procedure Notes Scribed for Dr. Celine Ahr by J.Scotton Electronic Signature(s) Signed: 02/19/2022 5:20:30 PM By: Dellie Catholic RN Signed: 02/19/2022 5:26:04 PM By: Fredirick Maudlin MD FACS Entered By: Dellie Catholic on 02/19/2022 15:44:04 -------------------------------------------------------------------------------- Debridement Details Patient Name: Date of Service: Samuel Pigg MA R 02/19/2022 3:00 PM Medical Record Number: [id number] Patient Account Number: [id number] Date of Birth/Sex: Treating RN: 11-11-1965 (57 y.o. Collene Gobble Primary Care Provider: Nani Gasser Other Clinician: Referring Provider: Treating Provider/Extender: [place] in Treatment: 2 Debridement Performed for Assessment: Wound #1 Left Amputation Site - Transmetatarsal Performed By: Physician Fredirick Maudlin, MD Debridement Type: Debridement Severity of Tissue Pre Debridement: Fat layer exposed Level of Consciousness (Pre-procedure): Awake and Alert Pre-procedure Verification/Time Out Yes - 15:35 Taken: Start Time: 15:35 Pain Control: Lidocaine 5% topical ointment T Area Debrided (L x W): otal 0.7 (cm) x 0.7 (cm) = 0.49 (cm) Tissue and other material debrided: Non-Viable, Callus, Slough, Slough Level: Non-Viable Tissue Debridement Description: Selective/Open  Wound Instrument: Curette Bleeding: Minimum Hemostasis Achieved: Pressure End Time: 15:37 Procedural Pain: 0 Post Procedural Pain: 0 Response to Treatment: Procedure was tolerated well Level of Consciousness (Post- Awake and Alert procedure): Post Debridement Measurements of Total Wound Length: (cm) 0.7 Width: (cm) 0.3 Depth: (cm) 0.1 Volume: (cm) 0.016 Character of Wound/Ulcer Post Debridement: Improved Severity of Tissue Post Debridement: Fat layer exposed Post Procedure Diagnosis Same as Pre-procedure Notes Scribed for Dr. Celine Ahr by J.Scotton Electronic Signature(s) Signed: 02/19/2022 5:20:30 PM By: Dellie Catholic RN Signed: 02/19/2022 5:26:04 PM By: Fredirick Maudlin MD  FACS Entered By: Dellie Catholic on 02/19/2022 15:45:07 [name] ([id number]) [id number] Page 3 of 10 -------------------------------------------------------------------------------- HPI Details Patient Name: Date of Service: Samuel Pigg MA R 02/19/2022 3:00 PM Medical Record Number: [id number] Patient Account Number: [id number] Date of Birth/Sex: Treating RN: Jun 26, 1965 (57 y.o. M) Primary Care Provider: Nani Gasser Other Clinician: Referring Provider: Treating Provider/Extender: [place] in Treatment: 2 History of Present Illness HPI Description: ADMISSION 02/02/2022 This is a 57 year old [place] man who speaks only Arabic. The visit today was conducted with the assistance of the language line interpreter; the patient declines the use of an in person interpreter. He is a poorly controlled diabetic (last hemoglobin A1c 10.8, but it has been as high as 15 in the past) with CKD stage III and hypertension. 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 He required revision of the right TMA in October. This was done in the [facility] system. It is not  entirely clear how he was referred to our clinic however he has openings on the distal portion of the right foot as well as drainage coming from the plantar aspect of his left foot underneath some callus. The patient has just been applying dry gauze to both areas. 02/09/2022: The left-sided wound is quite a bit smaller today, but is covered with callus. The right sided wound has also contracted a little bit and is very clean, but there is also callus accumulation around the perimeter. Unfortunately, due to his Medicaid status, we have been unable to secure home health assistance and he has been changing the dressings on his own. He also has difficulty obtaining dressing supplies. 02/19/2022: Both wounds are smaller, particularly on the right. The left is covered with a layer of callus. No concern for infection. Electronic Signature(s) Signed: 02/19/2022 4:09:32 PM By: Fredirick Maudlin MD FACS Entered By: Fredirick Maudlin on 02/19/2022 16:09:32 -------------------------------------------------------------------------------- Physical Exam Details Patient Name: Date of Service: Samuel Pigg MA R 02/19/2022 3:00 PM Medical Record Number: [id number] Patient Account Number: [id number] Date of Birth/Sex: Treating RN: [birth year] (57 y.o. M) Primary Care Provider: Nani Gasser Other Clinician: Referring Provider: Treating Provider/Extender: San Morelle in Treatment: 2 Constitutional . . . . no acute distress. Respiratory Normal work of breathing on room air. Notes 02/19/2022: Both wounds are smaller, particularly on the right. The left is covered with a layer of callus. No concern for infection. Electronic Signature(s) Signed: 02/19/2022 4:10:00 PM By: Fredirick Maudlin MD FACS Entered By: Fredirick Maudlin on 02/19/2022 16:10:00 [name] ([id number]) [id number] Page 4 of  10 -------------------------------------------------------------------------------- Physician Orders Details Patient Name: Date of Service: Samuel Pigg MA R 02/19/2022 3:00 PM Medical Record Number: [id number] Patient Account Number: [id number] Date of Birth/Sex: Treating RN: [birth year] (57 y.o. 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 - Dr. Celine Ahr +++ INTERPRETER [facility] Return A Other: - Dr. Radene Ou for [facility] Wound #1 Left Amputation Site - Transmetatarsal (In clinic) Topical Lidocaine 5% applied to  wound bed Off-Loading Wound #1 Left Amputation Site - Transmetatarsal DH Walker Boot to: Wound #2 Right Amputation Site - Transmetatarsal DH Walker Boot to: [facility] for skilled nursing wound care. May utilize formulary equivalent dressing for wound treatment orders unless otherwise specified. - Skilled nursing for wound care Dressing changes to be completed by [place] on Tuesday / Thursday / Saturday except when patient has scheduled visit at [facility]. Wound Treatment Wound #1 - Amputation Site - Transmetatarsal Wound Laterality: Left Cleanser: Soap and Water 1 x Per Day/30 Days Discharge Instructions: May shower and wash wound with dial antibacterial soap and water prior to dressing change. Peri-Wound Care: Sween Lotion (Moisturizing lotion) 1 x Per Day/30 Days Discharge  Instructions: Apply moisturizing lotion as directed Prim Dressing: Sorbalgon AG Dressing, 4x4 (in/in) 1 x Per Day/30 Days ary Discharge Instructions: Apply to wound bed as instructed Secondary Dressing: ALLEVYN Heel 4 1/2in x 5 1/2in / 10.5cm x 13.5cm 1 x Per Day/30 Days Discharge Instructions: Apply over primary dressing as directed. Secondary Dressing: ABD Pad, 8x10 1 x Per Day/30 Days Discharge Instructions: Apply over primary dressing as directed. Secured With: Elastic Bandage 4 inch (ACE bandage) 1 x Per Day/30 Days Discharge Instructions: Secure with ACE bandage as directed. Secured With: [employer], 4.5x3.1 (in/yd) 1 x Per Day/30 Days Discharge Instructions: Secure with Kerlix as directed. Secured With: 35M Medipore H Soft Cloth Surgical T ape, 4 x 10 (in/yd) 1 x Per Day/30 Days Discharge Instructions: Secure with tape as directed. Wound #2 - Amputation Site - Transmetatarsal Wound Laterality: Right Cleanser: Soap and Water 1 x Per Day/30 Days Discharge Instructions: May shower and wash wound with dial antibacterial soap and water prior to dressing change. Peri-Wound Care: Sween Lotion (Moisturizing lotion) 1 x Per Day/30 Days Discharge Instructions: Apply moisturizing lotion as directed Prim Dressing: Sorbalgon AG Dressing, 4x4 (in/in) ary 1 x Per Day/30 Days [name] ([id number]) [id number].pdf Page 5 of 10 Discharge Instructions: Apply to wound bed as instructed Secondary Dressing: ALLEVYN Heel 4 1/2in x 5 1/2in / 10.5cm x 13.5cm 1 x Per Day/30 Days Discharge Instructions: Apply over primary dressing as directed. Secondary Dressing: ABD Pad, 8x10 1 x Per Day/30 Days Discharge Instructions: Apply over primary dressing as directed. Secured With: Elastic Bandage 4 inch (ACE bandage) 1 x Per Day/30 Days Discharge Instructions: Secure with ACE bandage as directed. Secured With: [employer], 4.5x3.1 (in/yd) 1 x Per Day/30 Days Discharge  Instructions: Secure with Kerlix as directed. Secured With: 35M Medipore H Soft Cloth Surgical T ape, 4 x 10 (in/yd) 1 x Per Day/30 Days Discharge Instructions: Secure with tape as directed. Electronic Signature(s) Signed: 02/19/2022 5:26:04 PM By: Fredirick Maudlin MD FACS Entered By: Fredirick Maudlin on 02/19/2022 16:10:28 -------------------------------------------------------------------------------- Problem List Details Patient Name: Date of Service: Samuel Pigg MA R 02/19/2022 3:00 PM Medical Record Number: [id number] Patient Account Number: [id number] Date of Birth/Sex: Treating RN: [birth year] (57 y.o. 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 02/02/2022 No Yes without evidence of necrosis L97.522 Non-pressure chronic ulcer of other part of left foot with fat layer exposed 02/02/2022 No Yes E11.621 Type 2 diabetes mellitus with foot ulcer 02/02/2022 No Yes Z89.439 Acquired absence of unspecified foot 02/02/2022 No Yes Inactive  Problems Resolved Problems Electronic Signature(s) Signed: 02/19/2022 4:08:47 PM By: Fredirick Maudlin MD FACS Entered By: Fredirick Maudlin on 02/19/2022 16:08:47 [name] ([id number]) [id number] Page 6 of 10 -------------------------------------------------------------------------------- Progress Note Details Patient Name: Date of Service: Samuel Pigg MA R 02/19/2022 3:00 PM Medical Record Number: [id number] Patient Account Number: [id number] Date of Birth/Sex: Treating RN: Aug 05, 1965 (57 y.o. 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 The visit today was conducted with the assistance of the language line interpreter; the patient declines the use of an in person interpreter. He is a poorly controlled diabetic (last hemoglobin A1c 10.8, but it has been as high as 15 in the past) with CKD stage III and hypertension. 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 He required revision of the right TMA in October. This was done in the [facility] system. It is not entirely clear how he was referred to our clinic however he has openings on the distal portion of the right foot as well as drainage coming from the plantar aspect of his left foot underneath some callus. The patient has just been applying dry gauze to both areas. 02/09/2022: The left-sided wound is quite a bit smaller today, but is covered with callus. The right sided wound has also contracted a little bit and is very clean, but there is also callus accumulation around the perimeter. Unfortunately, due to his Medicaid status, we have been unable to secure home health assistance and he has been changing the dressings on his own. He also has difficulty obtaining dressing supplies. 02/19/2022: Both wounds are smaller, particularly on the right. The left is covered with a layer of callus. No concern for infection. Patient History Family History Unknown History, Cancer, No family history of Diabetes, Heart Disease, Hereditary Spherocytosis, Hypertension, Kidney Disease, Lung Disease, Seizures, Stroke, Thyroid  Problems, Tuberculosis. Social History Never smoker, Marital Status - Married, Alcohol Use - Never, Drug Use - No History, Caffeine Use - Rarely. Medical History Hematologic/Lymphatic Patient has history of Lymphedema Cardiovascular Patient has history of Hypertension Endocrine Patient has history of Type II Diabetes Neurologic Patient has history of Neuropathy Medical A Surgical History Notes nd Genitourinary AKI Objective Constitutional no acute distress. Vitals Time Taken: 3:29 PM, Height: 72 in, Weight: 214 lbs, BMI: 29, Temperature: 98.9 F, Pulse: 94 bpm, Respiratory Rate: 16 breaths/min, Blood Pressure: 134/84 mmHg. Respiratory Normal work of breathing on room air. [place], [name] ([id number]) [id number] Page 7 of 10 General Notes: 02/19/2022: Both wounds are smaller, particularly on the right. The left is covered with a layer of callus. No concern for infection. Integumentary (Hair, Skin) Wound #1 status is Open. Original cause of wound was Surgical Injury. The date acquired was: 02/01/2022. The wound has been in treatment 2 weeks. The wound is located on the Left Amputation Site - Transmetatarsal. The wound measures 0.7cm length x 0.3cm width x 0.1cm depth; 0.165cm^2 area and 0.016cm^3 volume. There is no tunneling or undermining noted. There is a medium amount of serous drainage noted. There is large (67-100%) pink granulation within the wound bed. There  is a small (1-33%) amount of necrotic tissue within the wound bed including Adherent Slough. The periwound skin appearance had no abnormalities noted for color. The periwound skin appearance exhibited: Callus, Dry/Scaly. The periwound has tenderness on palpation. Wound #2 status is Open. Original cause of wound was Surgical Injury. The date acquired was: 10/30/2021. The wound has been in treatment 2 weeks. The wound is located on the Right Amputation Site - Transmetatarsal. The wound measures 2cm length  x 2cm width x 0.1cm depth; 3.142cm^2 area and 0.314cm^3 volume. There is no tunneling or undermining noted. There is a medium amount of serous drainage noted. There is medium (34-66%) red, pink granulation within the wound bed. There is a medium (34-66%) amount of necrotic tissue within the wound bed including Adherent Slough. The periwound skin appearance had no abnormalities noted for color. The periwound skin appearance exhibited: Callus, Maceration. The periwound skin appearance did not exhibit: Dry/Scaly. Periwound temperature was noted as No Abnormality. 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 There was a Selective/Open Wound Non-Viable Tissue Debridement with a total area of 0.49 sq cm performed by Fredirick Maudlin, MD. With the following instrument(s): Curette to remove Non-Viable tissue/material. Material removed includes Callus and Slough and after achieving pain control using Lidocaine 5% topical ointment. No specimens were taken. A time out was conducted at 15:35, prior to the start of the procedure. A Minimum amount of bleeding was controlled with Pressure. The procedure was tolerated well with a pain level of 0 throughout and a pain level of 0 following the procedure. Post Debridement Measurements: 0.7cm length x 0.3cm width x 0.1cm depth; 0.016cm^3 volume. Character of Wound/Ulcer Post Debridement is improved. Severity of Tissue Post Debridement is: Fat layer exposed. Post procedure Diagnosis Wound #1: Same as  Pre-Procedure General Notes: Scribed for Dr. Celine Ahr by J.Scotton. Wound #2 Pre-procedure diagnosis of Wound #2 is a Diabetic Wound/Ulcer of the Lower Extremity located on the Right Amputation Site - Transmetatarsal .Severity of Tissue Pre Debridement is: Fat layer exposed. There was a Selective/Open Wound Non-Viable Tissue Debridement with a total area of 4 sq cm performed by Fredirick Maudlin, MD. With the following instrument(s): Curette to remove Non-Viable tissue/material. Material removed includes Callus and Slough and after achieving pain control using Lidocaine 5% topical ointment. No specimens were taken. A time out was conducted at 15:35, prior to the start of the procedure. A Minimum amount of bleeding was controlled with Pressure. The procedure was tolerated well with a pain level of 0 throughout and a pain level of 0 following the procedure. Post Debridement Measurements: 2cm length x 2cm width x 0.1cm depth; 0.314cm^3 volume. Character of Wound/Ulcer Post Debridement is improved. Severity of Tissue Post Debridement is: Fat layer exposed. Post procedure Diagnosis Wound #2: Same as Pre-Procedure General Notes: Scribed for Dr. Celine Ahr by J.Scotton. Plan Follow-up Appointments: Return Appointment in 1 week. - Dr. Celine Ahr +++ INTERPRETER [facility] Other: - Dr. Radene Ou for [facility]est[facility]: Wound #1 Left Amputation Site - Transmetatarsal: (In clinic) Topical Lidocaine 5% applied to wound bed Off-Loading: Wound #1 Left Amputation Site -  Transmetatarsal: DH Walker Boot to: Wound #2 Right Amputation Site - Transmetatarsal: DH Walker Boot to: Home Health: Admit to [facility] for skilled nursing wound care. May utilize formulary equivalent dressing for wound treatment orders unless otherwise specified. - Skilled nursing for wound care Dressing changes to be completed by [place] on Tuesday / Thursday / Saturday except when patient has scheduled visit at [facility]. WOUND #1: - Amputation Site - Transmetatarsal Wound Laterality: Left Cleanser: Soap and Water 1 x Per Day/30 Days Discharge Instructions: May shower and wash wound with dial antibacterial soap and water prior to dressing change. Peri-Wound Care: Sween Lotion (Moisturizing lotion) 1 x Per Day/30 Days Discharge Instructions: Apply moisturizing lotion as directed Prim Dressing: Sorbalgon AG Dressing, 4x4 (in/in) 1 x Per Day/30 Days ary Discharge Instructions: Apply to wound bed as instructed Secondary Dressing: ALLEVYN Heel 4 1/2in x 5 1/2in / 10.5cm x 13.5cm 1 x Per Day/30 Days Discharge Instructions: Apply over primary dressing as directed. [place], [name] ([id number]) [id number] Page 8 of 10 Secondary Dressing: ABD Pad, 8x10 1 x Per Day/30 Days Discharge Instructions: Apply over primary dressing as directed. Secured With: Elastic Bandage 4 inch (ACE bandage) 1 x Per Day/30 Days Discharge Instructions: Secure with ACE bandage as directed. Secured With: [employer], 4.5x3.1 (in/yd) 1 x Per Day/30 Days Discharge Instructions: Secure with Kerlix as directed. Secured With: 11M Medipore H Soft Cloth Surgical T ape, 4 x 10 (in/yd) 1 x Per Day/30 Days Discharge Instructions: Secure with tape as directed. WOUND #2: - Amputation Site - Transmetatarsal Wound Laterality: Right Cleanser: Soap and Water 1 x Per Day/30 Days Discharge Instructions: May shower and wash wound with dial antibacterial soap and water prior to dressing change. Peri-Wound Care: Sween Lotion (Moisturizing lotion) 1 x Per Day/30 Days Discharge Instructions: Apply moisturizing lotion as directed Prim Dressing: Sorbalgon AG Dressing, 4x4 (in/in) 1 x Per Day/30 Days ary Discharge Instructions: Apply to wound bed as instructed Secondary Dressing: ALLEVYN Heel 4 1/2in x 5 1/2in / 10.5cm x 13.5cm 1 x Per Day/30 Days Discharge Instructions: Apply over primary dressing as directed. Secondary  Dressing: ABD Pad, 8x10 1 x Per Day/30 Days Discharge Instructions: Apply over primary dressing as directed. Secured With: Elastic Bandage 4 inch (ACE bandage) 1 x Per Day/30 Days Discharge Instructions: Secure with ACE bandage as directed. Secured With: [employer], 4.5x3.1 (in/yd) 1 x Per Day/30 Days Discharge Instructions: Secure with Kerlix as directed. Secured With: 11M Medipore H Soft Cloth Surgical T ape, 4 x 10 (in/yd) 1 x Per Day/30 Days Discharge Instructions: Secure with tape as directed. 02/19/2022: Both wounds are smaller, particularly on the right. The left is covered with a layer of callus. No concern for infection. I used a curette to debride callus and slough from both wounds. We will continue silver alginate with heel cup padding, Kerlix and Ace bandages. Follow-up in 1 week. Electronic Signature(s) Signed: 02/19/2022 4:11:15 PM By: Fredirick Maudlin MD FACS Entered By: Fredirick Maudlin on 02/19/2022 16:11:15 -------------------------------------------------------------------------------- HxROS Details Patient Name: Date of Service: Samuel Pigg MA R 02/19/2022 3:00 PM Medical Record Number: [id number] Patient Account Number: [id number] Date of Birth/Sex: Treating RN: [birth year] 1965 (57 y.o. M) Primary Care Provider: Nani Gasser Other Clinician: Referring Provider: Treating Provider/Extender: [place] in Treatment: 2 Hematologic/Lymphatic Medical History: Positive for: Lymphedema Cardiovascular Medical History: Positive for: Hypertension Endocrine Medical History: Positive for: Type II Diabetes Treated with: Insulin, Oral agents Genitourinary Medical History: Past  Medical History Notes: AKI Neurologic SAATHVIK, EVERY ([id number]) [id number] Page 9 of 10 Medical History: Positive for: Neuropathy Immunizations Pneumococcal Vaccine: Received Pneumococcal Vaccination: Yes Received Pneumococcal  Vaccination On or After 60th Birthday: Yes Implantable Devices No devices added Family and Social History Unknown History: Yes; Cancer: Yes; Diabetes: No; Heart Disease: No; Hereditary Spherocytosis: No; Hypertension: No; Kidney Disease: No; Lung Disease: No; Seizures: No; Stroke: No; Thyroid Problems: No; Tuberculosis: No; Never smoker; Marital Status - Married; Alcohol Use: Never; Drug Use: No History; Caffeine Use: Rarely; Financial Concerns: No; Food, Clothing or Shelter Needs: No; Support System Lacking: No; Transportation Concerns: No Electronic Signature(s) Signed: 02/19/2022 5:26:04 PM By: Fredirick Maudlin MD FACS Entered By: Fredirick Maudlin on 02/19/2022 16:09:38 -------------------------------------------------------------------------------- SuperBill Details Patient Name: Date of Service: Samuel Pigg MA R 02/19/2022 Medical Record Number: [id number] Patient Account Number: [id number] Date of Birth/Sex: Treating RN: Jul 01, 1965 (56 y.o. 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 E11.621 Type 2 diabetes mellitus with foot ulcer Z89.439 Acquired absence of unspecified foot Facility Procedures : CPT4 Code: 96789381 Description: 01751 - DEBRIDE WOUND 1ST 20 SQ CM OR < ICD-10 Diagnosis Description 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 wit L97.522 Non-pressure chronic ulcer of other part of left foot with fat layer  exposed Modifier: hout evidence of n Quantity: 1 ecrosis Physician Procedures : CPT4 Code Description Modifier [id number] - WC PHYS LEVEL 3 - EST PT 25  ICD-10 Diagnosis Description 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

## 2022-02-19 NOTE — Progress Notes (Signed)
Hernandez, Samuel Maw ([id number]) [id number] Page 1 of 9 Visit Report for 02/19/2022 Arrival Information Details Patient Name: Date of Service: [name] 02/19/2022 3:00 PM Medical Record Number: [id number] Patient Account Number: [id number] Date of Birth/Sex: Treating RN: 05-Dec-1965 (57 y.o. Collene Gobble Primary Care James Senn: Nani Gasser Other Clinician: Referring Fallyn Munnerlyn: Treating Daemien Fronczak/Extender: [place] in Treatment: 2 Visit Information History Since Last Visit Added or deleted any medications: No Patient Arrived: Ambulatory Any new allergies or adverse reactions: No Arrival Time: 15:00 Had a fall or experienced change in No Accompanied By: Interpreter Phone + activities of daily living that may affect spouse risk of falls: Transfer Assistance: None Signs or symptoms of abuse/neglect since last visito No Patient Requires Transmission-Based No Hospitalized since last visit: No Precautions: Implantable device outside of the clinic excluding No cellular tissue based products placed in the center since last visit: Pain Present Now: No Notes Pt. speaks Psychologist, clinical) Signed: 02/19/2022 5:20:30 PM By: Dellie Catholic RN Entered By: Dellie Catholic on 02/19/2022 15:40:08 -------------------------------------------------------------------------------- Encounter Discharge Information Details Patient Name: Date of Service: [name] 02/19/2022 3:00 PM Medical Record Number: [id number] Patient Account Number: [id number] Date of Birth/Sex: Treating RN: 11-25-1965 (57 y.o. Collene Gobble Primary Care Tatyana Biber: Nani Gasser Other Clinician: Referring Virginie Josten: Treating Jeraldean Wechter/Extender: [place] in Treatment: 2 Encounter Discharge Information Items Post Procedure Vitals Discharge Condition: Stable Temperature (F): 98.9 Ambulatory Status:  Ambulatory Pulse (bpm): 94 Discharge Destination: Home Respiratory Rate (breaths/min): 16 Transportation: Private Auto Blood Pressure (mmHg): 134/84 Accompanied By: spouse (and interpreter phone) Schedule Follow-up Appointment: Yes Clinical Summary of Care: Patient Declined Electronic Signature(s) Signed: 02/19/2022 5:20:30 PM By: Dellie Catholic RN Entered By: Dellie Catholic on 02/19/2022 17:09:21 [name] ([id number]) [id number] Page 2 of 9 -------------------------------------------------------------------------------- Lower Extremity Assessment Details Patient Name: Date of Service: [name] 02/19/2022 3:00 PM Medical Record Number: [id number] Patient Account Number: [id number] Date of Birth/Sex: Treating RN: 1965/09/24 (57 y.o. Collene Gobble Primary Care Myron Stankovich: Nani Gasser Other Clinician: Referring Yula Crotwell: Treating Wileen Duncanson/Extender: [place] in Treatment: 2 Edema Assessment Assessed: [name]: No] [name]: No] [Left: Edema] [Right: :] Calf Left: Right: Point of Measurement: From Medial Instep 36 cm 35 cm Ankle Left: Right: Point of Measurement: From Medial Instep 24 cm 23.5 cm Vascular Assessment Pulses: Dorsalis Pedis Palpable: [Left:Yes] [Right:Yes] Electronic Signature(s) Signed: 02/19/2022 5:20:30 PM By: Dellie Catholic RN Entered By: Dellie Catholic on 02/19/2022 15:40:56 -------------------------------------------------------------------------------- Multi Wound Chart Details Patient Name: Date of Service: [name] 02/19/2022 3:00 PM Medical Record Number: [id number] Patient Account Number: [id number] Date of Birth/Sex: Treating RN: 1965/11/07 (57 y.o. M) Primary Care Gwendlyon Zumbro: Nani Gasser Other Clinician: Referring Aitanna Haubner: Treating Vessie Olmsted/Extender: [place] in Treatment: 2 Vital Signs Height(in): 72 Pulse(bpm): 94 Weight(lbs):  214 Blood Pressure(mmHg): 134/84 Body Mass Index(BMI): 29 Temperature(F): 98.9 Respiratory Rate(breaths/min): 16 [1:Photos:] [N/A:N/A] Left Amputation Site - Transmetatarsal Right Amputation Site - N/A Wound Location: Transmetatarsal [place], Samuel Maw ([id number]) [id number] Page 3 of 9 Surgical Injury Surgical Injury N/A Wounding Event: Diabetic Wound/Ulcer of the Lower Diabetic Wound/Ulcer of the Lower N/A Primary Etiology: Extremity Extremity Lymphedema, Hypertension, Type II Lymphedema, Hypertension, Type II N/A Comorbid History: Diabetes, Neuropathy Diabetes, Neuropathy 02/01/2022 10/30/2021 N/A Date Acquired: 2 2 N/A Weeks of Treatment: Open Open N/A Wound Status: No No N/A Wound Recurrence: 0.7x0.3x0.1 2x2x0.1 N/A Measurements L x W x D (cm)  0.165 3.142 N/A A (cm) : rea 0.016 0.314 N/A Volume (cm) : 89.50% 74.60% N/A % Reduction in A rea: 89.80% 74.60% N/A % Reduction in Volume: Grade 1 Grade 1 N/A Classification: Medium Medium N/A Exudate A mount: Serous Serous N/A Exudate Type: amber amber N/A Exudate Color: Large (67-100%) Medium (34-66%) N/A Granulation A mount: Pink Red, Pink N/A Granulation Quality: Small (1-33%) Medium (34-66%) N/A Necrotic A mount: None Small (1-33%) N/A Epithelialization: Debridement - Selective/Open Wound Debridement - Selective/Open Wound N/A Debridement: Pre-procedure Verification/Time Out 15:35 15:35 N/A Taken: Lidocaine 5% topical ointment Lidocaine 5% topical ointment N/A Pain Control: Callus, Slough Callus, Slough N/A Tissue Debrided: Non-Viable Tissue Non-Viable Tissue N/A Level: 0.49 4 N/A Debridement A (sq cm): rea Curette Curette N/A Instrument: Minimum Minimum N/A Bleeding: Pressure Pressure N/A Hemostasis A chieved: 0 0 N/A Procedural Pain: 0 0 N/A Post Procedural Pain: Procedure was tolerated well Procedure was tolerated well N/A Debridement Treatment Response: 0.7x0.3x0.1  2x2x0.1 N/A Post Debridement Measurements L x W x D (cm) 0.016 0.314 N/A Post Debridement Volume: (cm) Callus: Yes Callus: Yes N/A Periwound Skin Texture: Dry/Scaly: Yes Maceration: Yes N/A Periwound Skin Moisture: Dry/Scaly: No No Abnormalities Noted No Abnormalities Noted N/A Periwound Skin Color: N/A No Abnormality N/A Temperature: Yes N/A N/A Tenderness on Palpation: Debridement Debridement N/A Procedures Performed: Treatment Notes Electronic Signature(s) Signed: 02/19/2022 4:08:53 PM By: Fredirick Maudlin MD FACS Entered By: Fredirick Maudlin on 02/19/2022 16:08:53 -------------------------------------------------------------------------------- Multi-Disciplinary Care Plan Details Patient Name: Date of Service: [name] 02/19/2022 3:00 PM Medical Record Number: [id number] Patient Account Number: [id number] Date of Birth/Sex: Treating RN: [birth year] (57 y.o. Collene Gobble Primary Care Lajean Boese: Nani Gasser Other Clinician: Referring Nataline Basara: Treating Folasade Mooty/Extender: [place] in Treatment: 2 Active Inactive Nutrition Nursing Diagnoses: Impaired glucose control: actual or potential GoalsZAYQUAN, Hernandez ([id number]) [id number] Page 4 of 9 Patient/caregiver verbalizes understanding of need to maintain therapeutic glucose control per primary care physician Date Initiated: 02/02/2022 Target Resolution Date: 03/29/2022 Goal Status: Active Interventions: Assess patient nutrition upon admission and as needed per policy Provide education on elevated blood sugars and impact on wound healing Treatment Activities: Dietary management education, guidance and counseling : 02/02/2022 Notes: Orientation to the Wound Care Program Nursing Diagnoses: Knowledge deficit related to the wound healing center program Goals: Patient/caregiver will verbalize understanding of the [place] Program Date  Initiated: 02/02/2022 Target Resolution Date: 03/29/2022 Goal Status: Active Interventions: Provide education on orientation to the wound center Notes: Wound/Skin Impairment Nursing Diagnoses: Impaired tissue integrity Goals: Ulcer/skin breakdown will have a volume reduction of 30% by week 4 Date Initiated: 02/02/2022 Target Resolution Date: 03/29/2022 Goal Status: Active Interventions: Assess ulceration(s) every visit Provide education on ulcer and skin care Treatment Activities: Patient referred to home care : 02/02/2022 Skin care regimen initiated : 02/02/2022 Notes: Electronic Signature(s) Signed: 02/19/2022 5:20:30 PM By: Dellie Catholic RN Entered By: Dellie Catholic on 02/19/2022 17:05:59 -------------------------------------------------------------------------------- Pain Assessment Details Patient Name: Date of Service: [name] 02/19/2022 3:00 PM Medical Record Number: [id number] Patient Account Number: [id number] Date of Birth/Sex: Treating RN: 07-12-1965 (57 y.o. Collene Gobble Primary Care Jakia Kennebrew: Nani Gasser Other Clinician: Referring Arletta Lumadue: Treating Jalexa Pifer/Extender: [place] in Treatment: 2 Active Problems Location of Pain Severity and Description of Pain Patient Has Paino No Site Locations [name], [name] ([id number]) [id number] Page 5 of 9 Pain Management and Medication Current Pain Management: Electronic Signature(s) Signed: 02/19/2022 5:20:30 PM By: Minus Liberty,  Mechele Claude RN Entered By: Dellie Catholic on 02/19/2022 15:40:49 -------------------------------------------------------------------------------- Patient/Caregiver Education Details Patient Name: Date of Service: [name] 1/22/2024andnbsp3:00 PM Medical Record Number: [id number] Patient Account Number: [id number] Date of Birth/Gender: Treating RN: 03/28/1965 (57 y.o. Collene Gobble Primary Care Physician: Nani Gasser  Other Clinician: Referring Physician: Treating Physician/Extender: [place] in Treatment: 2 Education Assessment Education Provided To: Patient Education Topics Provided Electronic Signature(s) Signed: 02/19/2022 5:20:30 PM By: Dellie Catholic RN Entered By: Dellie Catholic on 02/19/2022 17:06:07 -------------------------------------------------------------------------------- Wound Assessment Details Patient Name: Date of Service: [name] 02/19/2022 3:00 PM Medical Record Number: [id number] Patient Account Number: [id number] Date of Birth/Sex: Treating RN: 17-Apr-1965 (57 y.o. Collene Gobble Primary Care Lenyx Boody: Nani Gasser Other Clinician: Referring Jaiel Saraceno: Treating Arlynn Stare/Extender: [place] in Treatment: 2 Wound Status Wound Number: 1 Primary Etiology: Diabetic Wound/Ulcer of the Lower Extremity [place] Hernandez, Samuel Maw ([id number]) [id number] Page 6 of 9 Wound Location: Left Amputation Site - Transmetatarsal Wound Status: Open Wounding Event: Surgical Injury Comorbid Lymphedema, Hypertension, Type II Diabetes, Neuropathy History: Date Acquired: 02/01/2022 Weeks Of Treatment: 2 Clustered Wound: No Photos Wound Measurements Length: (cm) 0.7 Width: (cm) 0.3 Depth: (cm) 0.1 Area: (cm) 0.165 Volume: (cm) 0.016 % Reduction in Area: 89.5% % Reduction in Volume: 89.8% Epithelialization: None Tunneling: No Undermining: No Wound Description Classification: Grade 1 Exudate Amount: Medium Exudate Type: Serous Exudate Color: amber Foul Odor After Cleansing: No Slough/Fibrino Yes Wound Bed Granulation Amount: Large (67-100%) Granulation Quality: Pink Necrotic Amount: Small (1-33%) Necrotic Quality: Adherent Slough Periwound Skin Texture Texture Color No Abnormalities Noted: No No Abnormalities Noted: Yes Callus: Yes Temperature / Pain Tenderness on Palpation:  Yes Moisture No Abnormalities Noted: No Dry / Scaly: Yes Treatment Notes Wound #1 (Amputation Site - Transmetatarsal) Wound Laterality: Left Cleanser Soap and Water Discharge Instruction: May shower and wash wound with dial antibacterial soap and water prior to dressing change. Peri-Wound Care Sween Lotion (Moisturizing lotion) Discharge Instruction: Apply moisturizing lotion as directed Topical Primary Dressing Sorbalgon AG Dressing, 4x4 (in/in) Discharge Instruction: Apply to wound bed as instructed Secondary Dressing ALLEVYN Heel 4 1/2in x 5 1/2in / 10.5cm x 13.5cm Discharge Instruction: Apply over primary dressing as directed. ABD Pad, 8x10 Discharge Instruction: Apply over primary dressing as directed. Secured With Elastic Bandage 4 inch (ACE bandage) [name], [name] ([id number]) [id number] Page 7 of 9 Discharge Instruction: Secure with ACE bandage as directed. Kerlix Roll Sterile, 4.5x3.1 (in/yd) Discharge Instruction: Secure with Kerlix as directed. 30M Medipore H Soft Cloth Surgical T ape, 4 x 10 (in/yd) Discharge Instruction: Secure with tape as directed. Compression Wrap Compression Stockings Add-Ons Electronic Signature(s) Signed: 02/19/2022 5:20:30 PM By: Dellie Catholic RN Entered By: Dellie Catholic on 02/19/2022 15:38:45 -------------------------------------------------------------------------------- Wound Assessment Details Patient Name: Date of Service: [name] 02/19/2022 3:00 PM Medical Record Number: [id number] Patient Account Number: [id number] Date of Birth/Sex: Treating RN: 1965-04-04 (57 y.o. Collene Gobble Primary Care Massey Ruhland: Nani Gasser Other Clinician: Referring Guida Asman: Treating Melondy Blanchard/Extender: [place] in Treatment: 2 Wound Status Wound Number: 2 Primary Etiology: Diabetic Wound/Ulcer of the Lower Extremity Wound Location: Right Amputation Site -  Transmetatarsal Wound Status: Open Wounding Event: Surgical Injury Comorbid Lymphedema, Hypertension, Type II Diabetes, Neuropathy History: Date Acquired: 10/30/2021 Weeks Of Treatment: 2 Clustered Wound: No Photos Wound Measurements Length: (cm) 2 Width: (cm) 2 Depth: (cm) 0.1 Area: (cm) 3.142 Volume: (cm) 0.314 % Reduction in Area: 74.6% %  Reduction in Volume: 74.6% Epithelialization: Small (1-33%) Tunneling: No Undermining: No Wound Description Classification: Grade 1 Exudate Amount: Medium Exudate Type: Serous Exudate Color: amber Foul Odor After Cleansing: No Slough/Fibrino Yes Wound Bed Granulation Amount: Medium (34-66%) Exposed Structure Granulation Quality: Red, Pink Fascia Exposed: No Necrotic Amount: Medium (34-66%) Necrotic Quality: Adherent [name], [name] ([id number]) [id number] Page 8 of 9 Periwound Skin Texture Texture Color No Abnormalities Noted: No No Abnormalities Noted: Yes Callus: Yes Temperature / Pain Temperature: No Abnormality Moisture No Abnormalities Noted: No Dry / Scaly: No Maceration: Yes Treatment Notes Wound #2 (Amputation Site - Transmetatarsal) Wound Laterality: Right Cleanser Soap and Water Discharge Instruction: May shower and wash wound with dial antibacterial soap and water prior to dressing change. Peri-Wound Care Sween Lotion (Moisturizing lotion) Discharge Instruction: Apply moisturizing lotion as directed Topical Primary Dressing Sorbalgon AG Dressing, 4x4 (in/in) Discharge Instruction: Apply to wound bed as instructed Secondary Dressing ALLEVYN Heel 4 1/2in x 5 1/2in / 10.5cm x 13.5cm Discharge Instruction: Apply over primary dressing as directed. ABD Pad, 8x10 Discharge Instruction: Apply over primary dressing as directed. Secured With Elastic Bandage 4 inch (ACE bandage) Discharge Instruction: Secure with ACE bandage as directed. Kerlix Roll Sterile, 4.5x3.1 (in/yd) Discharge  Instruction: Secure with Kerlix as directed. 17M Medipore H Soft Cloth Surgical T ape, 4 x 10 (in/yd) Discharge Instruction: Secure with tape as directed. Compression Wrap Compression Stockings Add-Ons Electronic Signature(s) Signed: 02/19/2022 5:20:30 PM By: Dellie Catholic RN Entered By: Dellie Catholic on 02/19/2022 15:23:25 -------------------------------------------------------------------------------- Vitals Details Patient Name: Date of Service: [name] 02/19/2022 3:00 PM Medical Record Number: [id number] Patient Account Number: [id number] Date of Birth/Sex: Treating RN: 02/26/1965 (57 y.o. Collene Gobble Primary Care Genevieve Arbaugh: Nani Gasser Other Clinician: Referring Diamonique Ruedas: Treating Karol Liendo/Extender: [place] in Treatment: 2 Vital Signs Time Taken: 15:29 Temperature (F): 98.9 Height (in): 72 Pulse (bpm): 94 Weight (lbs): 214 Respiratory Rate (breaths/min): 16 Body Mass Index (BMI): 29 Blood Pressure (mmHg): 134/84 [name], [name] ([id number]) [id number] Page 9 of 9 Reference Range: 80 - 120 mg / dl Electronic Signature(s) Signed: 02/19/2022 5:20:30 PM By: Dellie Catholic RN Entered By: Dellie Catholic on 02/19/2022 15:40:43

## 2022-02-22 ENCOUNTER — Ambulatory Visit: Payer: Medicaid Other | Admitting: Internal Medicine

## 2022-02-22 ENCOUNTER — Encounter: Payer: Self-pay | Admitting: Internal Medicine

## 2022-02-22 ENCOUNTER — Other Ambulatory Visit (HOSPITAL_COMMUNITY): Payer: Self-pay

## 2022-02-22 VITALS — BP 138/81 | HR 82 | Temp 98.2°F | Wt 213.4 lb

## 2022-02-22 DIAGNOSIS — Z794 Long term (current) use of insulin: Secondary | ICD-10-CM

## 2022-02-22 DIAGNOSIS — Z89439 Acquired absence of unspecified foot: Secondary | ICD-10-CM

## 2022-02-22 DIAGNOSIS — E1165 Type 2 diabetes mellitus with hyperglycemia: Secondary | ICD-10-CM

## 2022-02-22 DIAGNOSIS — I10 Essential (primary) hypertension: Secondary | ICD-10-CM

## 2022-02-22 MED ORDER — INSULIN LISPRO 100 UNIT/ML IJ SOLN
5.0000 [IU] | Freq: Three times a day (TID) | INTRAMUSCULAR | 3 refills | Status: DC
  Filled 2022-02-22 – 2022-03-02 (×3): qty 10, 67d supply, fill #0
  Filled 2022-06-07 – 2022-06-08 (×2): qty 10, 67d supply, fill #1
  Filled 2022-06-20: qty 10, 67d supply, fill #2

## 2022-02-22 MED ORDER — AMLODIPINE BESYLATE 5 MG PO TABS
5.0000 mg | ORAL_TABLET | Freq: Every day | ORAL | 3 refills | Status: DC
  Filled 2022-02-22: qty 90, 90d supply, fill #0

## 2022-02-22 MED ORDER — OLMESARTAN MEDOXOMIL 20 MG PO TABS
20.0000 mg | ORAL_TABLET | Freq: Every day | ORAL | 3 refills | Status: DC
  Filled 2022-02-22: qty 30, 30d supply, fill #0

## 2022-02-22 NOTE — Progress Notes (Signed)
   CC: DM  HPI:[name] is a 57 y.o. male who presents for evaluation of DM. Please see individual problem based A/P for details.  1 yom arabic speaking with hx of HTN, DM2 and complications including neuropathy and HLD  Depression, PHQ-9: Based on the patients  [place] Visit from 02/08/2022 in [place]  PHQ-9 Total Score 1      score we have  .  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  No murmurs, rubs, or gallops Pulmonary : Equal breath sounds, No wheezes, rales, or rhonchi Abdominal: soft, nontender,  bowel sounds present Ext: No edema in lower extremities, no tenderness to palpation of lower extremities.   Assessment & Plan:   See Encounters Tab for problem based charting.  Patient discussed with Dr. Philipp Ovens

## 2022-02-22 NOTE — Patient Instructions (Addendum)
Dear [name],  Thank you for trusting [place] with your care today.   We discussed your blood pressure, diabetes, and foot infection.   Please ensure that you start taking the Humalog 5 units with each meal, including breakfast. Please take your other diabetes medicines as prescribed. I have sent a referral to our diabetic and nutrition educator. She will be able to assist with setting up the continuous glucose monitor.   I have refilled your blood pressure medicines. Please take these each day.  Our front office will call you soon to help set up the MRI. Please make sure that you continue to follow with the wound care and foot doctor.   We would like to see you back in the clinic in 3-4 weeks.

## 2022-02-26 ENCOUNTER — Encounter: Payer: Self-pay | Admitting: Internal Medicine

## 2022-02-26 NOTE — Assessment & Plan Note (Signed)
Reports being out of medication. Not taking any of his currently prescribed medications for past 2 days. Lasix is not a long term medication for him. He was taking it for lower extremity swelling.   BP above goal today, due to medication non-adherence. Encouraged adherence. No changes to regimen currently, will plan to follow up at next OV.

## 2022-02-26 NOTE — Assessment & Plan Note (Signed)
Currently prescribed metformin, humalod, levemir, jardiance, dulaglutide. He reports since starting humalod, CBGs have improved from typical 400s to now 100s. He has not been using humalod in the morning if AM fasting CBG below 100.   Patient sounds as though his diabetes control has improved. Educated patient on importance of using short acting humalog with each meal.  Also discussed needing to switch long acting levemir to alternative since this medication is no onger being produced. He reports he recently picked up refills and prefers to finish out the month prior to changing agents. Will plan to do this at follow up. Also placed referral to diabetic education. Plan for 2 month follow up to repeat A1c.

## 2022-02-26 NOTE — Assessment & Plan Note (Signed)
Patient currently being treated for presumptive osteomyelitis. He does follow with wound care and podiatry. He saw podiatry yesterday. He has been taking doxycycline. Has MRI ordered, but has not heard from anyone regarding scheduling of this yet.   Will need to have MRI done to assess for resolution of osteo. If resolved on imaging, can stop doxy. If no resolution, will need to continue abx and discuss with patient likelihood of amputation. He does follow with podiatry as well.

## 2022-02-27 ENCOUNTER — Encounter (HOSPITAL_BASED_OUTPATIENT_CLINIC_OR_DEPARTMENT_OTHER): Payer: Medicaid Other | Admitting: General Surgery

## 2022-02-27 DIAGNOSIS — E11621 Type 2 diabetes mellitus with foot ulcer: Secondary | ICD-10-CM | POA: Diagnosis not present

## 2022-02-27 NOTE — Progress Notes (Signed)
[place], [name] ([id number]) [id number] Page 1 of 10 Visit Report for 02/27/2022 Chief Complaint Document Details Patient Name: Date of Service: [name] 02/27/2022 3:00 PM Medical Record Number: [id number] Patient Account Number: [id number] Date of Birth/Sex: Treating RN: 02/10/1965 (57 y.o. 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 Janyth Contes Primary Care Provider: Nani Gasser Other Clinician: Referring Provider: Treating Provider/Extender: San Morelle in Treatment: 3 Debridement Performed for Assessment: Wound #1 Left Amputation Site - Transmetatarsal Performed By: Physician Fredirick Maudlin, MD Debridement Type: Debridement Severity of Tissue Pre Debridement: Fat layer exposed Level of Consciousness (Pre-procedure): Awake and Alert Pre-procedure Verification/Time Out Yes - 15:18 Taken: Start Time: 15:18 Pain Control: Lidocaine 5% topical ointment T Area Debrided (L x W): otal 0.4 (cm) x 0.4 (cm) = 0.16 (cm) Tissue and other material debrided: Non-Viable, Callus, Subcutaneous Level: Skin/Subcutaneous  Tissue Debridement Description: Excisional Instrument: Curette Bleeding: Minimum Hemostasis Achieved: Pressure Response to Treatment: Procedure was tolerated well Level of Consciousness (Post- Awake and Alert procedure): Post Debridement Measurements of Total Wound Length: (cm) 0.1 Width: (cm) 0.1 Depth: (cm) 0.1 Volume: (cm) 0.001 Character of Wound/Ulcer Post Debridement: Improved Severity of Tissue Post Debridement: Fat layer exposed Post Procedure Diagnosis [name], [name] ([id number]) [id number] Page 2 of 10 Same as Pre-procedure Notes scribed for Dr. Celine Ahr by Adline Peals, RN Electronic Signature(s) Signed: 02/27/2022 3:42:54 PM By: Fredirick Maudlin MD FACS Signed: 02/27/2022 3:55:15 PM By: Sabas Sous By: Adline Peals on 02/27/2022 15:19:23 -------------------------------------------------------------------------------- Debridement Details Patient Name: Date of Service: [name] 02/27/2022 3:00 PM Medical Record Number: [id number] Patient Account Number: [id number] Date of Birth/Sex: Treating RN: 1965-09-19 (57 y.o. Janyth Contes Primary Care Provider: Nani Gasser Other Clinician: Referring Provider: Treating Provider/Extender: San Morelle in Treatment: 3 Debridement Performed for Assessment: Wound #2 Right Amputation Site - Transmetatarsal Performed By: Physician Fredirick Maudlin, MD Debridement Type: Debridement Severity of Tissue Pre Debridement: Fat layer exposed Level of Consciousness (Pre-procedure): Awake and Alert Pre-procedure Verification/Time Out Yes - 15:18 Taken: Start Time: 15:18 Pain Control: Lidocaine 5% topical ointment T Area Debrided (L x W): otal 1.5 (cm) x 1.5 (cm) = 2.25 (cm) Tissue and other material debrided: Non-Viable, Callus, Slough, Slough Level: Non-Viable Tissue Debridement Description: Selective/Open Wound Instrument:  Curette Bleeding: Minimum Hemostasis Achieved: Pressure Response to Treatment: Procedure was tolerated well Level of Consciousness (Post- Awake and Alert procedure): Post Debridement Measurements of Total Wound Length: (cm) 1.5 Width: (cm) 1.5 Depth: (cm) 0.1 Volume: (cm) 0.177 Character of Wound/Ulcer Post Debridement: Improved Severity of Tissue Post Debridement: Fat layer exposed Post Procedure Diagnosis Same as Pre-procedure Notes Scribed for Dr. Celine Ahr by Adline Peals, RN Electronic Signature(s) Signed: 02/27/2022 3:42:54 PM By: Fredirick Maudlin MD FACS Signed: 02/27/2022 3:55:15 PM By: Adline Peals Entered By: Adline Peals on 02/27/2022 15:20:52 HPI Details -------------------------------------------------------------------------------- [name] ([id number]) [id number] Page 3 of 10 Patient  Name: Date of Service: [name] 02/27/2022 3:00 PM Medical Record Number: [id number] Patient Account Number: [id number] Date of Birth/Sex: Treating RN: [birth year] (57 y.o. M) Primary Care Provider: Nani Gasser Other Clinician: Referring Provider: Treating Provider/Extender: San Morelle in Treatment: 3 History of Present Illness HPI Description: ADMISSION 02/02/2022 This is a 57 year old [place] man who speaks only Arabic. The visit today was conducted with the assistance of the language line interpreter; the patient declines the use of an in person interpreter. He is a poorly controlled diabetic (last hemoglobin A1c 10.8, but it has been as high as 15 in the past) with CKD stage III and hypertension. 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 He required revision of the right TMA in October. This was done in the [place] system. It is not entirely clear how he was referred to our clinic however he has  openings on the distal portion of the right foot as well as drainage coming from the plantar aspect of his left foot underneath some callus. The patient has just been applying dry gauze to both areas. 02/09/2022: The left-sided wound is quite a bit smaller today, but is covered with callus. The right sided wound has also contracted a little bit and is very clean, but there is also callus accumulation around the perimeter. Unfortunately, due to his Medicaid status, we have been unable to secure home health assistance and he has been changing the dressings on his own. He also has difficulty obtaining dressing supplies. 02/19/2022: Both wounds are smaller, particularly on the right. The left is covered with a layer of callus. No concern for infection. 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 There is light slough on the surface with some surrounding callus. The left plantar foot wound has callused over. Underneath the callus, the there is a small opening in the skin. Electronic Signature(s) Signed: 02/27/2022 3:38:03 PM By: Fredirick Maudlin MD FACS Entered By: Fredirick Maudlin on 02/27/2022 15:38:03 -------------------------------------------------------------------------------- Physical Exam Details Patient Name: Date of Service: [name] 02/27/2022 3:00 PM Medical Record Number: [id number] Patient Account Number: [id number] Date of Birth/Sex: Treating RN: October 07, 1965 (57 y.o. M) Primary Care Provider: Nani Gasser Other Clinician: Referring Provider: Treating Provider/Extender: San Morelle in Treatment: 3 Constitutional Hypertensive, asymptomatic. . . . no acute distress. Respiratory Normal work of breathing on room air. Notes 02/27/2022: The right transmetatarsal amputation site has contracted further. There is light slough on the surface with some surrounding callus. The left plantar foot wound has callused over.  Underneath the callus, the there is a small opening in the skin. Electronic Signature(s) Signed: 02/27/2022 3:38:36 PM By: Fredirick Maudlin MD FACS Entered By: Fredirick Maudlin on 02/27/2022 15:38:36 -------------------------------------------------------------------------------- Physician Orders Details Patient Name: Date of Service: [name] 02/27/2022 3:00 PM Medical Record Number: [id number] Patient Account Number: [id number] Date of Birth/Sex: Treating RN: December 03, 1965 (57 y.o. 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 - Dr. Celine Ahr +++ INTERPRETER [facility] Return A Anesthetic Wound #1 Left Amputation Site - Transmetatarsal (In clinic) Topical Lidocaine 5% applied to wound bed Bathing/ Shower/ Hygiene May shower and wash wound with soap and water. Off-Loading Wound #1 Left Amputation Site - Transmetatarsal DH Walker Boot to: Wound #2 Right Amputation Site - Transmetatarsal DH Walker Boot to: [place] [facility] [place] for skilled nursing wound care. May utilize formulary equivalent dressing for wound treatment orders unless otherwise specified. - Skilled nursing for wound care Dressing changes to be completed by [place] on Tuesday / Thursday / Saturday except when patient has scheduled visit at [facility]. Wound  Treatment Wound #1 - Amputation Site - Transmetatarsal Wound Laterality: Left Cleanser: Soap and Water 1 x Per Day/30 Days Discharge Instructions: May shower and wash wound with dial antibacterial soap and water prior to dressing change. Peri-Wound Care: Sween Lotion (Moisturizing lotion) 1 x Per Day/30 Days Discharge Instructions: Apply moisturizing lotion as directed Prim Dressing: Sorbalgon AG Dressing, 4x4 (in/in) 1 x Per Day/30 Days ary Discharge Instructions: Apply to wound bed as instructed Secondary Dressing: ALLEVYN Heel 4 1/2in x 5 1/2in / 10.5cm x 13.5cm 1 x Per Day/30 Days Discharge Instructions: Apply over primary dressing as directed. Secondary Dressing: ABD Pad, 8x10 1 x Per Day/30 Days Discharge Instructions: Apply over primary dressing as directed. Secured With: Elastic Bandage 4 inch (ACE bandage) 1 x Per Day/30 Days Discharge Instructions: Secure with ACE bandage as directed. Secured With: [employer], 4.5x3.1 (in/yd) 1 x Per Day/30 Days Discharge Instructions: Secure with Kerlix as directed. Secured With: 86M Medipore H Soft Cloth Surgical T ape, 4 x 10 (in/yd) 1 x Per Day/30 Days Discharge Instructions: Secure with tape as directed. Wound #2 - Amputation Site - Transmetatarsal Wound Laterality: Right Cleanser: Soap and Water 1 x Per Day/30 Days Discharge Instructions: May shower and wash wound with dial antibacterial soap and water prior to dressing change. Peri-Wound Care: Sween Lotion (Moisturizing lotion) 1 x Per Day/30 Days Discharge Instructions: Apply moisturizing lotion as directed Prim Dressing: Sorbalgon AG Dressing, 4x4 (in/in) 1 x Per Day/30 Days ary Discharge Instructions: Apply to wound bed as instructed Secondary Dressing: ALLEVYN Heel 4 1/2in x 5 1/2in / 10.5cm x 13.5cm 1 x Per Day/30 Days Discharge Instructions: Apply over primary dressing as directed. [place], [name] ([id number]) [id number] Page 5 of 10 Secondary  Dressing: ABD Pad, 8x10 1 x Per Day/30 Days Discharge Instructions: Apply over primary dressing as directed. Secured With: Elastic Bandage 4 inch (ACE bandage) 1 x Per Day/30 Days Discharge Instructions: Secure with ACE bandage as directed. Secured With: [employer], 4.5x3.1 (in/yd) 1 x Per Day/30 Days Discharge Instructions: Secure with Kerlix as directed. Secured With: 86M Medipore H Soft Cloth Surgical T ape, 4 x 10 (in/yd) 1 x Per Day/30 Days Discharge Instructions: Secure with tape as directed. Patient Medications llergies: Novolog U-100 Insulin aspart, penicillin A Notifications Medication Indication Start End 02/27/2022 lidocaine DOSE topical 5 % ointment - ointment topical Electronic Signature(s) Signed: 02/27/2022 3:42:54 PM By: Fredirick Maudlin MD FACS Entered By: Fredirick Maudlin on 02/27/2022 15:41:06 -------------------------------------------------------------------------------- Problem List Details Patient Name: Date of Service: [name] 02/27/2022 3:00 PM Medical Record Number: [id number] Patient Account Number: [id number] Date of Birth/Sex: Treating RN: 04/26/1965 (57 y.o. 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 02/02/2022 No Yes without evidence of necrosis L97.522 Non-pressure chronic ulcer of other part of left foot with fat layer exposed 02/02/2022 No Yes E11.621 Type 2 diabetes mellitus with foot ulcer 02/02/2022 No Yes Z89.439 Acquired absence of unspecified foot 02/02/2022 No Yes Inactive Problems Resolved Problems Electronic Signature(s) Signed: 02/27/2022 3:36:57 PM By: Fredirick Maudlin MD [place], [name] ([id number]) [id number] Page 6  of 10 Signed: 02/27/2022 3:36:57 PM By: Fredirick Maudlin MD FACS Entered By: Fredirick Maudlin on 02/27/2022 15:36:57 -------------------------------------------------------------------------------- Progress Note Details Patient Name: Date of Service: [name] 02/27/2022 3:00 PM Medical Record Number: [id number] Patient Account Number: [id number] Date of Birth/Sex: Treating RN: 1965/10/22 (57 y.o. 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 The visit today was conducted with the assistance of the language line interpreter; the patient declines the use of an in person interpreter. He is a poorly controlled diabetic (last hemoglobin A1c 10.8, but it has been as high as 15 in the past) with CKD stage III and hypertension. 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 He required revision of the right TMA in October. This was done in the [place] system. It is not entirely clear how he was referred to our clinic however he has openings on the distal portion of the right foot as well as drainage coming from the plantar aspect of his left foot underneath some callus. The patient has just been applying dry gauze to both areas. 02/09/2022: The left-sided wound is quite a bit smaller today, but is covered with callus. The right sided wound has also contracted a little bit and is very clean, but there is also callus  accumulation around the perimeter. Unfortunately, due to his Medicaid status, we have been unable to secure home health assistance and he has been changing the dressings on his own. He also has difficulty obtaining dressing supplies. 02/19/2022: Both wounds are smaller, particularly on the right. The left is covered with a layer of callus. No concern for infection. 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 There is light slough on the surface with some surrounding callus. The left plantar foot wound has callused over. Underneath the callus, the there is a small opening in the skin. Patient History Family History Unknown History, Cancer, No family history of Diabetes, Heart Disease, Hereditary Spherocytosis, Hypertension, Kidney Disease, Lung Disease, Seizures, Stroke, Thyroid Problems, Tuberculosis. Social History Never smoker, Marital Status - Married, Alcohol Use - Never, Drug Use - No History, Caffeine Use - Rarely. Medical History Hematologic/Lymphatic Patient has history of Lymphedema Cardiovascular Patient has history of Hypertension Endocrine Patient has history of Type II Diabetes Neurologic Patient has history of Neuropathy Medical A Surgical History Notes nd Genitourinary AKI Objective Constitutional Hypertensive, asymptomatic. no acute distress. [place], [name] ([id number]) [id number] Page 7 of 10 Vitals Time Taken: 3:09 PM, Height: 72 in, Weight: 214 lbs, BMI: 29, Temperature: 98.8 F, Pulse: 87 bpm, Respiratory Rate: 16  breaths/min, Blood Pressure: 158/98 mmHg. Respiratory Normal work of breathing on room air. General Notes: 02/27/2022: The right transmetatarsal amputation site has contracted further. There is light slough on the surface with some surrounding callus. The left plantar foot wound has callused over. Underneath the callus, the there is a small opening in the skin. Integumentary (Hair, Skin) Wound  #1 status is Open. Original cause of wound was Surgical Injury. The date acquired was: 02/01/2022. The wound has been in treatment 3 weeks. The wound is located on the Left Amputation Site - Transmetatarsal. The wound measures 0.1cm length x 0.1cm width x 0.1cm depth; 0.008cm^2 area and 0.001cm^3 volume. There is no tunneling or undermining noted. There is a none present amount of drainage noted. The wound margin is distinct with the outline attached to the wound base. There is no granulation within the wound bed. There is a large (67-100%) amount of necrotic tissue within the wound bed including Eschar. The periwound skin appearance had no abnormalities noted for color. The periwound skin appearance exhibited: Callus, Dry/Scaly. Wound #2 status is Open. Original cause of wound was Surgical Injury. The date acquired was: 10/30/2021. The wound has been in treatment 3 weeks. The wound is located on the Right Amputation Site - Transmetatarsal. The wound measures 1.5cm length x 1.5cm width x 0.1cm depth; 1.767cm^2 area and 0.177cm^3 volume. There is Fat Layer (Subcutaneous Tissue) exposed. There is no tunneling or undermining noted. There is a medium amount of serous drainage noted. The wound margin is distinct with the outline attached to the wound base. There is large (67-100%) red, pink granulation within the wound bed. There is a small (1-33%) amount of necrotic tissue within the wound bed including Adherent Slough. The periwound skin appearance had no abnormalities noted for moisture. The periwound skin appearance had no abnormalities noted for color. The periwound skin appearance exhibited: Callus. Periwound temperature was noted as No Abnormality. 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 There was a Excisional Skin/Subcutaneous Tissue Debridement with a total area of 0.16 sq cm performed by Fredirick Maudlin, MD. With the following instrument(s): Curette to remove Non-Viable tissue/material. Material removed includes Callus and Subcutaneous Tissue and after achieving pain control using Lidocaine 5% topical ointment. No specimens were taken. A time out was conducted at 15:18, prior to the start of the procedure. A Minimum amount of bleeding was controlled with Pressure. The procedure was tolerated well. Post Debridement Measurements: 0.1cm length x 0.1cm width x 0.1cm depth; 0.001cm^3 volume. Character of Wound/Ulcer Post Debridement is improved. Severity of Tissue Post Debridement is: Fat layer exposed. Post procedure Diagnosis Wound #1: Same as Pre-Procedure General Notes: scribed for Dr. Celine Ahr by Adline Peals, RN. Wound #2 Pre-procedure diagnosis of Wound #2 is a Diabetic Wound/Ulcer of the Lower Extremity located on the Right Amputation Site - Transmetatarsal .Severity of Tissue Pre Debridement is: Fat layer exposed. There was a Selective/Open Wound Non-Viable Tissue Debridement with a total area of 2.25 sq cm performed by Fredirick Maudlin, MD. With the following instrument(s): Curette to remove Non-Viable tissue/material. Material removed includes Callus and [facility]  and after achieving pain control using Lidocaine 5% topical ointment. No specimens were taken. A time out was conducted at 15:18, prior to the start of the procedure. A Minimum amount of bleeding was controlled with Pressure. The procedure was tolerated well. Post Debridement Measurements: 1.5cm length x 1.5cm width x 0.1cm depth; 0.177cm^3  volume. Character of Wound/Ulcer Post Debridement is improved. Severity of Tissue Post Debridement is: Fat layer exposed. Post procedure Diagnosis Wound #2: Same as Pre-Procedure General Notes: Scribed for Dr. Celine Ahr by Adline Peals, RN. Plan Follow-up Appointments: Return Appointment in 1 week. - Dr. Celine Ahr +++ INTERPRETER [employer] Anesthetic: Wound #1 Left Amputation Site - Transmetatarsal: (In clinic) Topical Lidocaine 5% applied to wound bed Bathing/ Shower/ Hygiene: May shower and wash wound with soap and water. Off-Loading: Wound #1 Left Amputation Site - Transmetatarsal: DH Walker Boot to: Wound #2 Right Amputation Site - Transmetatarsal: DH Walker Boot to: Home Health: Admit to [facility] for skilled nursing wound care. May utilize formulary equivalent dressing for wound treatment orders unless otherwise specified. - Skilled [place], [place] ([id number]) [id number] Page 8 of 10 nursing for wound care Dressing changes to be completed by [place] on Tuesday / Thursday / Saturday except when patient has scheduled visit at [facility]. The following medication(s) was prescribed: lidocaine topical 5 % ointment ointment topical was prescribed at facility WOUND #1: - Amputation Site - Transmetatarsal Wound Laterality: Left Cleanser: Soap and Water 1 x Per Day/30 Days Discharge Instructions: May shower and wash wound with dial antibacterial soap and water prior to dressing change. Peri-Wound Care: Sween Lotion (Moisturizing lotion) 1 x Per Day/30 Days Discharge Instructions: Apply moisturizing lotion as directed Prim Dressing: Sorbalgon AG Dressing, 4x4 (in/in) 1 x Per Day/30 Days ary Discharge Instructions: Apply to wound bed as instructed Secondary Dressing: ALLEVYN Heel 4 1/2in x 5 1/2in / 10.5cm x 13.5cm 1 x Per Day/30 Days Discharge Instructions: Apply over primary dressing as directed. Secondary Dressing: ABD Pad, 8x10 1 x Per Day/30  Days Discharge Instructions: Apply over primary dressing as directed. Secured With: Elastic Bandage 4 inch (ACE bandage) 1 x Per Day/30 Days Discharge Instructions: Secure with ACE bandage as directed. Secured With: [employer], 4.5x3.1 (in/yd) 1 x Per Day/30 Days Discharge Instructions: Secure with Kerlix as directed. Secured With: 77M Medipore H Soft Cloth Surgical T ape, 4 x 10 (in/yd) 1 x Per Day/30 Days Discharge Instructions: Secure with tape as directed. WOUND #2: - Amputation Site - Transmetatarsal Wound Laterality: Right Cleanser: Soap and Water 1 x Per Day/30 Days Discharge Instructions: May shower and wash wound with dial antibacterial soap and water prior to dressing change. Peri-Wound Care: Sween Lotion (Moisturizing lotion) 1 x Per Day/30 Days Discharge Instructions: Apply moisturizing lotion as directed Prim Dressing: Sorbalgon AG Dressing, 4x4 (in/in) 1 x Per Day/30 Days ary Discharge Instructions: Apply to wound bed as instructed Secondary Dressing: ALLEVYN Heel 4 1/2in x 5 1/2in / 10.5cm x 13.5cm 1 x Per Day/30 Days Discharge Instructions: Apply over primary dressing as directed. Secondary Dressing: ABD Pad, 8x10 1 x Per Day/30 Days Discharge Instructions: Apply over primary dressing as directed. Secured With: Elastic Bandage 4 inch (ACE bandage) 1 x Per Day/30 Days Discharge Instructions: Secure with ACE bandage as directed. Secured With: [employer], 4.5x3.1 (in/yd) 1 x Per Day/30 Days Discharge Instructions: Secure with Kerlix as directed. Secured With: 77M Medipore H Soft Cloth Surgical T ape, 4 x 10 (in/yd) 1 x Per Day/30 Days Discharge Instructions: Secure  with tape as directed. 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 There is light slough on the surface with some surrounding callus. The left plantar foot wound has callused over. Underneath the callus, the there is a small opening in the skin. I used a curette to  debride callus and nonviable subcutaneous tissue from the left plantar foot wound. I debrided callus and slough from the right transmetatarsal amputation site. We will continue silver alginate to both locations with heel cups as padding/protectors. The patient's wife says that he is doing a good job of staying off of his feet throughout the day. He will follow-up in 1 week. Electronic Signature(s) Signed: 02/27/2022 3:42:09 PM By: Fredirick Maudlin MD FACS Entered By: Fredirick Maudlin on 02/27/2022 15:42:09 -------------------------------------------------------------------------------- HxROS Details Patient Name: Date of Service: [name] 02/27/2022 3:00 PM Medical Record Number: [id number] Patient Account Number: [id number] Date of Birth/Sex: Treating RN: [birth year] (57 y.o. M) Primary Care Provider: Nani Gasser Other Clinician: Referring Provider: Treating Provider/Extender: [place] More[place] in Treatment: 3 Hematologic/Lymphatic Medical History: Positive for: Lymphedema Cardiovascular Medical History: Positive for: Hypertension Endocrine AEDEN, MATRANGA ([id number]) [id number] Page 9 of 10 Medical History: Positive for: Type II Diabetes Treated with: Insulin, Oral agents Genitourinary Medical History: Past Medical History Notes: AKI Neurologic Medical History: Positive for: Neuropathy Immunizations Pneumococcal Vaccine: Received Pneumococcal Vaccination: Yes Received Pneumococcal Vaccination On or After 60th Birthday: Yes Implantable Devices No devices added Family and Social History Unknown History: Yes; Cancer: Yes; Diabetes: No; Heart Disease: No; Hereditary Spherocytosis: No; Hypertension: No; Kidney Disease: No; Lung Disease: No; Seizures: No; Stroke: No; Thyroid Problems: No; Tuberculosis: No; Never smoker; Marital Status - Married; Alcohol Use: Never; Drug Use: No History; Caffeine Use: Rarely; Financial  Concerns: No; Food, Clothing or Shelter Needs: No; Support System Lacking: No; Transportation Concerns: No Engineer, maintenance) Signed: 02/27/2022 3:42:54 PM By: Fredirick Maudlin MD FACS Entered By: Fredirick Maudlin on 02/27/2022 15:38:09 -------------------------------------------------------------------------------- SuperBill Details Patient Name: Date of Service: [name] 02/27/2022 Medical Record Number: [id number] Patient Account Number: [id number] Date of Birth/Sex: Treating RN: 1965/02/15 (57 y.o. 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 E11.621 Type 2 diabetes mellitus with foot ulcer Z89.439 Acquired absence of unspecified foot Facility Procedures : CPT4 Code: 71062694 Description: [place] - DEB SUBQ TISSUE 20 SQ CM/< ICD-10 Diagnosis Description L97.522 Non-pressure chronic ulcer of other part of left foot with fat layer exposed Modifier: Quantity: 1 : CPT4 Code: 85462703 Description: 50093 - DEBRIDE WOUND 1ST 20 SQ CM OR < ICD-10 Diagnosis Description 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 wit Modifier: hout evidence of n Quantity: 1 ecrosis Physician Procedures [name], [name] ([id number]): CPT4 Code Description [id number] - WC PHYS LEVEL 3 - EST PT ICD-10 Diagnosis Description L97.515 Non-pressure chronic ulcer of other part of right foot with muscle L97.522 Non-pressure chronic ulcer of other part of left foot  with fat laye E11.621 Type 2 diabetes mellitus with foot ulcer Z89.439 Acquired absence of unspecified  foot [id number] Page 10 of 10: Quantity Modifier 25 1 involvement without evidence of necrosis Hernandez exposed Vanwey, Rye ([id number]): [id number] - WC PHYS SUBQ  TISS 20 SQ CM ICD-10 Diagnosis Description L97.522 Non-pressure chronic ulcer of other part of left foot with fat laye [id number] Page 10 of 10: 1 Hernandez exposed [place], [name] ([id number]): [id number] - WC PHYS DEBR WO ANESTH 20 SQ CM ICD-10 Diagnosis Description L97.515 Non-pressure chronic ulcer of other part of right foot with muscle [id number] Page 10 of 10: 1 involvement without evidence of necrosis Electronic Signature(s) Signed: 02/27/2022 3:42:28 PM By: Fredirick Maudlin MD FACS Entered By: Fredirick Maudlin on 02/27/2022 15:42:28

## 2022-02-27 NOTE — Progress Notes (Signed)
Hernandez, Samuel Maw ([id number]) [id number] Page 1 of 9 Visit Report for 02/27/2022 Arrival Information Details Patient Name: Date of Service: Samuel Pigg MA R 02/27/2022 3:00 PM Medical Record Number: [id number] Patient Account Number: [id number] Date of Birth/Sex: Treating RN: 05-27-1965 (57 y.o. Janyth Contes Primary Care Antonin Meininger: Nani Gasser Other Clinician: Referring Shantrell Placzek: Treating Christerpher Clos/Extender: [place] in Treatment: 3 Visit Information History Since Last Visit Added or deleted any medications: No Patient Arrived: Ambulatory Any new allergies or adverse reactions: No Arrival Time: 15:03 Had a fall or experienced change in No Accompanied By: wife activities of daily living that may affect Transfer Assistance: None risk of falls: Patient Identification Verified: Yes Signs or symptoms of abuse/neglect since last visito No Secondary Verification Process Completed: Yes Hospitalized since last visit: No Patient Requires Transmission-Based Precautions: No Implantable device outside of the clinic excluding No cellular tissue based products placed in the center since last visit: Has Dressing in Place as Prescribed: Yes Pain Present Now: No Electronic Signature(s) Signed: 02/27/2022 3:55:15 PM By: Adline Peals Entered By: Adline Peals on 02/27/2022 15:09:53 -------------------------------------------------------------------------------- Encounter Discharge Information Details Patient Name: Date of Service: Samuel Pigg MA R 02/27/2022 3:00 PM Medical Record Number: [id number] Patient Account Number: [id number] Date of Birth/Sex: Treating RN: 11-21-1965 (57 y.o. Janyth Contes Primary Care Brentlee Sciara: Nani Gasser Other Clinician: Referring Morrigan Wickens: Treating Neita Landrigan/Extender: [place] in Treatment: 3 Encounter Discharge Information Items Post Procedure  Vitals Discharge Condition: Stable Temperature (F): 98.8 Ambulatory Status: Ambulatory Pulse (bpm): 87 Discharge Destination: Home Respiratory Rate (breaths/min): 16 Transportation: Private Auto Blood Pressure (mmHg): 158/98 Accompanied By: wife Schedule Follow-up Appointment: Yes Clinical Summary of Care: Patient Declined Electronic Signature(s) Signed: 02/27/2022 3:55:15 PM By: Adline Peals Entered By: Adline Peals on 02/27/2022 15:55:00 Ragain, Samuel Maw ([id number]) [id number] Page 2 of 9 -------------------------------------------------------------------------------- Lower Extremity Assessment Details Patient Name: Date of Service: Samuel Pigg MA R 02/27/2022 3:00 PM Medical Record Number: [id number] Patient Account Number: [id number] Date of Birth/Sex: Treating RN: [birth year] (57 y.o. Janyth Contes Primary Care Luise Yamamoto: Nani Gasser Other Clinician: Referring Yaw Escoto: Treating Makyi Ledo/Extender: [place] in Treatment: 3 Edema Assessment Assessed: Shirlyn Goltz: No] Patrice Paradise: No] [Left: Edema] [Right: :] Calf Left: Right: Point of Measurement: From Medial Instep 36 cm 35 cm Ankle Left: Right: Point of Measurement: From Medial Instep 24 cm 23.5 cm Electronic Signature(s) Signed: 02/27/2022 3:55:15 PM By: Adline Peals Entered By: Adline Peals on 02/27/2022 15:10:15 -------------------------------------------------------------------------------- Multi Wound Chart Details Patient Name: Date of Service: Samuel Pigg MA R 02/27/2022 3:00 PM Medical Record Number: [id number] Patient Account Number: [id number] Date of Birth/Sex: Treating RN: 1965-04-05 (57 y.o. M) Primary Care Chonita Gadea: Nani Gasser Other Clinician: Referring Shawnetta Lein: Treating Apple Dearmas/Extender: [place] More[place] in Treatment: 3 Vital Signs Height(in): 30 Pulse(bpm): 78 Weight(lbs): 214 Blood  Pressure(mmHg): 158/98 Body Mass Index(BMI): 29 Temperature(F): 98.8 Respiratory Rate(breaths/min): 16 [1:Photos:] [N/A:N/A] Left Amputation Site - Transmetatarsal Right Amputation Site - N/A Wound Location: Transmetatarsal Surgical Injury Surgical Injury N/A Wounding Event: Diabetic Wound/Ulcer of the Lower Diabetic Wound/Ulcer of the Lower N/A Primary Etiology: Extremity Extremity Lymphedema, Hypertension, Type II Lymphedema, Hypertension, Type II N/A Comorbid HistoryBARACK, Samuel ([id number]) [id number] Page 3 of 9 Diabetes, Neuropathy Diabetes, Neuropathy 02/01/2022 10/30/2021 N/A Date Acquired: 3 3 N/A Weeks of Treatment: Open Open N/A Wound Status: No No N/A Wound Recurrence: 0.1x0.1x0.1 1.5x1.5x0.1 N/A Measurements L x W x D (cm) 0.008 1.767 N/A A (cm) :  rea 0.001 0.177 N/A Volume (cm) : 99.50% 85.70% N/A % Reduction in A rea: 99.40% 85.70% N/A % Reduction in Volume: Grade 1 Grade 1 N/A Classification: None Present Medium N/A Exudate A mount: N/A Serous N/A Exudate Type: N/A amber N/A Exudate Color: Distinct, outline attached Distinct, outline attached N/A Wound Margin: None Present (0%) Large (67-100%) N/A Granulation A mount: N/A Red, Pink N/A Granulation Quality: Large (67-100%) Small (1-33%) N/A Necrotic A mount: Eschar Adherent Slough N/A Necrotic Tissue: Fascia: No Fat Layer (Subcutaneous Tissue): Yes N/A Exposed Structures: Fat Layer (Subcutaneous Tissue): No Fascia: No Tendon: No Tendon: No Muscle: No Muscle: No Joint: No Joint: No Bone: No Bone: No None Small (1-33%) N/A Epithelialization: Debridement - Excisional Debridement - Selective/Open Wound N/A Debridement: Pre-procedure Verification/Time Out 15:18 15:18 N/A Taken: Lidocaine 5% topical ointment Lidocaine 5% topical ointment N/A Pain Control: Callus, Subcutaneous Callus, Slough N/A Tissue Debrided: Skin/Subcutaneous Tissue Non-Viable Tissue  N/A Level: 0.16 2.25 N/A Debridement A (sq cm): rea Curette Curette N/A Instrument: Minimum Minimum N/A Bleeding: Pressure Pressure N/A Hemostasis A chieved: Procedure was tolerated well Procedure was tolerated well N/A Debridement Treatment Response: 0.1x0.1x0.1 1.5x1.5x0.1 N/A Post Debridement Measurements L x W x D (cm) 0.001 0.177 N/A Post Debridement Volume: (cm) Callus: Yes Callus: Yes N/A Periwound Skin Texture: Dry/Scaly: Yes Maceration: Yes N/A Periwound Skin Moisture: Dry/Scaly: No No Abnormalities Noted No Abnormalities Noted N/A Periwound Skin Color: N/A No Abnormality N/A Temperature: Debridement Debridement N/A Procedures Performed: Treatment Notes Electronic Signature(s) Signed: 02/27/2022 3:37:09 PM By: Fredirick Maudlin MD FACS Entered By: Fredirick Maudlin on 02/27/2022 15:37:09 -------------------------------------------------------------------------------- Multi-Disciplinary Care Plan Details Patient Name: Date of Service: Samuel Pigg MA R 02/27/2022 3:00 PM Medical Record Number: [id number] Patient Account Number: [id number] Date of Birth/Sex: Treating RN: 10/18/1965 (57 y.o. Janyth Contes Primary Care Mabry Tift: Nani Gasser Other Clinician: Referring Jahmil Macleod: Treating Estanislao Harmon/Extender: [place] in Treatment: 3 Active Inactive Nutrition Nursing Diagnoses: Impaired glucose control: actual or potential GoalsDEIONDRE, HARROWER ([id number]) [id number] Page 4 of 9 Patient/caregiver verbalizes understanding of need to maintain therapeutic glucose control per primary care physician Date Initiated: 02/02/2022 Target Resolution Date: 03/29/2022 Goal Status: Active Interventions: Assess patient nutrition upon admission and as needed per policy Provide education on elevated blood sugars and impact on wound healing Treatment Activities: Dietary management education, guidance and counseling  : 02/02/2022 Notes: Wound/Skin Impairment Nursing Diagnoses: Impaired tissue integrity Goals: Ulcer/skin breakdown will have a volume reduction of 30% by week 4 Date Initiated: 02/02/2022 Target Resolution Date: 03/29/2022 Goal Status: Active Interventions: Assess ulceration(s) every visit Provide education on ulcer and skin care Treatment Activities: Patient referred to home care : 02/02/2022 Skin care regimen initiated : 02/02/2022 Notes: Electronic Signature(s) Signed: 02/27/2022 3:55:15 PM By: Adline Peals Entered By: Adline Peals on 02/27/2022 15:18:02 -------------------------------------------------------------------------------- Pain Assessment Details Patient Name: Date of Service: Samuel Pigg MA R 02/27/2022 3:00 PM Medical Record Number: [id number] Patient Account Number: [id number] Date of Birth/Sex: Treating RN: [birth year] (57 y.o. Janyth Contes Primary Care Taytum Scheck: Nani Gasser Other Clinician: Referring Jo-Anne Kluth: Treating Starr Urias/Extender: [place] in Treatment: 3 Active Problems Location of Pain Severity and Description of Pain Patient Has Paino No Site Locations Rate the pain. Current Pain Level: 0 Alexa, Tylan ([id number]) [id number] Page 5 of 9 Pain Management and Medication Current Pain Management: Electronic Signature(s) Signed: 02/27/2022 3:55:15 PM By: Adline Peals Entered By: Adline Peals on 02/27/2022 15:10:13 -------------------------------------------------------------------------------- Patient/Caregiver Education Details Patient Name: Date of  Service: Samuel Pigg MA R 1/30/2024andnbsp3:00 PM Medical Record Number: [id number] Patient Account Number: [id number] Date of Birth/Gender: Treating RN: [birth year] (57 y.o. Janyth Contes Primary Care Physician: Nani Gasser Other Clinician: Referring Physician: Treating Physician/Extender: [place] in Treatment: 3 Education Assessment Education Provided To: Patient Education Topics Provided Wound/Skin Impairment: Methods: Explain/Verbal Responses: Reinforcements needed, State content correctly Electronic Signature(s) Signed: 02/27/2022 3:55:15 PM By: Adline Peals Entered By: Adline Peals on 02/27/2022 15:18:28 -------------------------------------------------------------------------------- Wound Assessment Details Patient Name: Date of Service: Samuel Pigg MA R 02/27/2022 3:00 PM Medical Record Number: [id number] Patient Account Number: [id number] Date of Birth/Sex: Treating RN: [birth year] (57 y.o. Janyth Contes Primary Care Jaqwon Manfred: Nani Gasser Other Clinician: Referring Jakhai Fant: Treating Jonnae Fonseca/Extender: [place] in Treatment: 3 Wound Status Wound Number: 1 Primary Etiology: Diabetic Wound/Ulcer of the Lower Extremity Wound Location: Left Amputation Site - Transmetatarsal Wound Status: Open Wounding Event: Surgical Injury Comorbid Lymphedema, Hypertension, Type II Diabetes, Neuropathy History: Date Acquired: 02/01/2022 Weeks Of Treatment: 3 Clustered Wound: No Photos [place], Samuel Maw ([id number]) [id number] Page 6 of 9 Wound Measurements Length: (cm) 0.1 Width: (cm) 0.1 Depth: (cm) 0.1 Area: (cm) 0.008 Volume: (cm) 0.001 % Reduction in Area: 99.5% % Reduction in Volume: 99.4% Epithelialization: None Tunneling: No Undermining: No Wound Description Classification: Grade 1 Wound Margin: Distinct, outline attached Exudate Amount: None Present Foul Odor After Cleansing: No Slough/Fibrino No Wound Bed Granulation Amount: None Present (0%) Exposed Structure Necrotic Amount: Large (67-100%) Fascia Exposed: No Necrotic Quality: Eschar Fat Layer (Subcutaneous Tissue) Exposed: No Tendon Exposed: No Muscle Exposed: No Joint Exposed: No Bone Exposed:  No Periwound Skin Texture Texture Color No Abnormalities Noted: No No Abnormalities Noted: Yes Callus: Yes Moisture No Abnormalities Noted: No Dry / Scaly: Yes Treatment Notes Wound #1 (Amputation Site - Transmetatarsal) Wound Laterality: Left Cleanser Soap and Water Discharge Instruction: May shower and wash wound with dial antibacterial soap and water prior to dressing change. Peri-Wound Care Sween Lotion (Moisturizing lotion) Discharge Instruction: Apply moisturizing lotion as directed Topical Primary Dressing [employer] Dressing, 4x4 (in/in) Discharge Instruction: Apply to wound bed as instructed Secondary Dressing ALLEVYN Heel 4 1/2in x 5 1/2in / 10.5cm x 13.5cm Discharge Instruction: Apply over primary dressing as directed. ABD Pad, 8x10 Discharge Instruction: Apply over primary dressing as directed. Secured With Elastic Bandage 4 inch (ACE bandage) Discharge Instruction: Secure with ACE bandage as directed. Kerlix Roll Sterile, 4.5x3.1 (in/yd) Discharge Instruction: Secure with Kerlix as directed. 70M Medipore H Soft Cloth Surgical Tape, 4 x 10 (in/yd) Fuhr, Argie ([id number]) [id number] Page 7 of 9 Discharge Instruction: Secure with tape as directed. Compression Wrap Compression Stockings Add-Ons Electronic Signature(s) Signed: 02/27/2022 3:55:15 PM By: Adline Peals Entered By: Adline Peals on 02/27/2022 15:14:59 -------------------------------------------------------------------------------- Wound Assessment Details Patient Name: Date of Service: Samuel Pigg MA R 02/27/2022 3:00 PM Medical Record Number: [id number] Patient Account Number: [id number] Date of Birth/Sex: Treating RN: 1965/10/02 (57 y.o. Janyth Contes Primary Care Aryiana Klinkner: Nani Gasser Other Clinician: Referring Maalik Pinn: Treating Dariella Gillihan/Extender: [place] in Treatment: 3 Wound Status Wound Number: 2 Primary  Etiology: Diabetic Wound/Ulcer of the Lower Extremity Wound Location: Right Amputation Site - Transmetatarsal Wound Status: Open Wounding Event: Surgical Injury Comorbid Lymphedema, Hypertension, Type II Diabetes, Neuropathy History: Date Acquired: 10/30/2021 Weeks Of Treatment: 3 Clustered Wound: No Photos Wound Measurements Length: (cm) 1.5 Width: (cm) 1.5 Depth: (cm) 0.1 Area: (cm) 1.767 Volume: (cm) 0.177 % Reduction in  Area: 85.7% % Reduction in Volume: 85.7% Epithelialization: Small (1-33%) Tunneling: No Undermining: No Wound Description Classification: Grade 1 Wound Margin: Distinct, outline attached Exudate Amount: Medium Exudate Type: Serous Exudate Color: amber Foul Odor After Cleansing: No Slough/Fibrino Yes Wound Bed Granulation Amount: Large (67-100%) Exposed Structure Granulation Quality: Red, Pink Fascia Exposed: No Necrotic Amount: Small (1-33%) Fat Layer (Subcutaneous Tissue) Exposed: Yes Necrotic Quality: Adherent Slough Tendon Exposed: No Muscle Exposed: No Joint Exposed: No Bone Exposed: No Pinho, Bolton ([id number]) [id number] Page 8 of 9 Periwound Skin Texture Texture Color No Abnormalities Noted: No No Abnormalities Noted: Yes Callus: Yes Temperature / Pain Temperature: No Abnormality Moisture No Abnormalities Noted: Yes Treatment Notes Wound #2 (Amputation Site - Transmetatarsal) Wound Laterality: Right Cleanser Soap and Water Discharge Instruction: May shower and wash wound with dial antibacterial soap and water prior to dressing change. Peri-Wound Care Sween Lotion (Moisturizing lotion) Discharge Instruction: Apply moisturizing lotion as directed Topical Primary Dressing [employer] Dressing, 4x4 (in/in) Discharge Instruction: Apply to wound bed as instructed Secondary Dressing ALLEVYN Heel 4 1/2in x 5 1/2in / 10.5cm x 13.5cm Discharge Instruction: Apply over primary dressing as directed. ABD Pad,  8x10 Discharge Instruction: Apply over primary dressing as directed. Secured With Elastic Bandage 4 inch (ACE bandage) Discharge Instruction: Secure with ACE bandage as directed. Kerlix Roll Sterile, 4.5x3.1 (in/yd) Discharge Instruction: Secure with Kerlix as directed. 78M Medipore H Soft Cloth Surgical T ape, 4 x 10 (in/yd) Discharge Instruction: Secure with tape as directed. Compression Wrap Compression Stockings Add-Ons Electronic Signature(s) Signed: 02/27/2022 3:55:15 PM By: Adline Peals Entered By: Adline Peals on 02/27/2022 15:15:24 -------------------------------------------------------------------------------- Vitals Details Patient Name: Date of Service: Samuel Pigg MA R 02/27/2022 3:00 PM Medical Record Number: [id number] Patient Account Number: [id number] Date of Birth/Sex: Treating RN: 12-19-1965 (57 y.o. Janyth Contes Primary Care Noble Cicalese: Nani Gasser Other Clinician: Referring Khaiden Segreto: Treating Yehudit Fulginiti/Extender: [place] in Treatment: 3 Vital Signs Time Taken: 15:09 Temperature (F): 98.8 Height (in): 72 Pulse (bpm): 87 Weight (lbs): 214 Respiratory Rate (breaths/min): 16 Body Mass Index (BMI): 29 Blood Pressure (mmHg): 158/98 Reference Range: 80 - 120 mg / dl Peil, Timonthy ([id number]) [id number] Page 9 of 9 Electronic Signature(s) Signed: 02/27/2022 3:55:15 PM By: Adline Peals Entered By: Adline Peals on 02/27/2022 15:10:06

## 2022-03-01 NOTE — Progress Notes (Signed)
Internal Medicine Clinic Attending ° °Case discussed with Dr. Gawaluck  At the time of the visit.  We reviewed the resident’s history and exam and pertinent patient test results.  I agree with the assessment, diagnosis, and plan of care documented in the resident’s note.  °

## 2022-03-02 ENCOUNTER — Other Ambulatory Visit (HOSPITAL_COMMUNITY): Payer: Self-pay

## 2022-03-06 ENCOUNTER — Encounter (HOSPITAL_BASED_OUTPATIENT_CLINIC_OR_DEPARTMENT_OTHER): Payer: Medicaid Other | Attending: General Surgery | Admitting: General Surgery

## 2022-03-06 DIAGNOSIS — N183 Chronic kidney disease, stage 3 unspecified: Secondary | ICD-10-CM | POA: Diagnosis not present

## 2022-03-06 DIAGNOSIS — L97522 Non-pressure chronic ulcer of other part of left foot with fat layer exposed: Secondary | ICD-10-CM | POA: Diagnosis not present

## 2022-03-06 DIAGNOSIS — E1140 Type 2 diabetes mellitus with diabetic neuropathy, unspecified: Secondary | ICD-10-CM | POA: Insufficient documentation

## 2022-03-06 DIAGNOSIS — L97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 without evidence of necrosis: Secondary | ICD-10-CM | POA: Diagnosis not present

## 2022-03-06 DIAGNOSIS — E1122 Type 2 diabetes mellitus with diabetic chronic kidney disease: Secondary | ICD-10-CM | POA: Diagnosis not present

## 2022-03-06 DIAGNOSIS — E11621 Type 2 diabetes mellitus with foot ulcer: Secondary | ICD-10-CM | POA: Insufficient documentation

## 2022-03-06 DIAGNOSIS — I129 Hypertensive chronic kidney disease with stage 1 through stage 4 chronic kidney disease, or unspecified chronic kidney disease: Secondary | ICD-10-CM | POA: Diagnosis not present

## 2022-03-06 DIAGNOSIS — Z89439 Acquired absence of unspecified foot: Secondary | ICD-10-CM | POA: Diagnosis not present

## 2022-03-07 NOTE — Progress Notes (Signed)
Fillmore, [name] ([id number]) [id number] Page 1 of 10 Visit Report for 03/06/2022 Chief Complaint Document Details Patient Name: Date of Service: Samuel Pigg MA R 03/06/2022 1:15 PM Medical Record Number: [id number] Patient Account Number: [id number] Date of Birth/Sex: Treating RN: 03/14/1965 (57 y.o. 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 [name] Primary Care Provider: Nani Gasser Other Clinician: Referring Provider: Treating Provider/Extender: San Morelle in Treatment: 4 Debridement Performed for Assessment: Wound #2 Right Amputation Site - Transmetatarsal Performed By: Physician Fredirick Maudlin, MD Debridement Type: Debridement Severity of Tissue Pre Debridement: Fat layer exposed Level of Consciousness (Pre-procedure): Awake and Alert Pre-procedure Verification/Time Out Yes - 14:15 Taken: Start Time: 14:17 Pain Control: Lidocaine 4% Topical Solution T Area Debrided (L x W): otal 2 (cm) x 2 (cm) = 4 (cm) Tissue and other material debrided: Non-Viable, Callus, Skin: Epidermis Level: Skin/Epidermis Debridement Description:  Selective/Open Wound Instrument: Curette Bleeding: Minimum Hemostasis Achieved: Pressure Procedural Pain: 0 Post Procedural Pain: 0 Response to Treatment: Procedure was tolerated well Level of Consciousness (Post- Awake and Alert procedure): Post Debridement Measurements of Total Wound Length: (cm) 1.8 Width: (cm) 1.4 Depth: (cm) 0.3 Volume: (cm) 0.594 Character of Wound/Ulcer Post Debridement: Improved Severity of Tissue Post Debridement: Fat layer exposed Alene Mires, Elise ([id number]) [id number] Page 2 of 10 Post Procedure Diagnosis Same as Pre-procedure Notes Scribed for Dr. Celine Ahr by Baruch Gouty, RN Electronic Signature(s) Signed: 03/06/2022 3:13:36 PM By: Fredirick Maudlin MD FACS Signed: 03/06/2022 5:05:54 PM By: Baruch Gouty RN, BSN Entered By: Baruch Gouty on 03/06/2022 14:20:59 -------------------------------------------------------------------------------- Debridement Details Patient Name: Date of Service: Samuel Pigg MA R 03/06/2022 1:15 PM Medical Record Number: [id number] Patient Account Number: [id number] Date of Birth/Sex: Treating RN: [birth year] (57 y.o. [name] Primary Care Provider: Nani Gasser Other Clinician: Referring Provider: Treating Provider/Extender: San Morelle in Treatment: 4 Debridement Performed for Assessment: Wound #1 Left Amputation Site - Transmetatarsal Performed By: Physician Fredirick Maudlin, MD Debridement Type: Debridement Severity of Tissue Pre Debridement: Fat layer exposed Level of Consciousness (Pre-procedure): Awake and Alert Pre-procedure Verification/Time Out Yes - 14:15 Taken: Start Time: 14:17 Pain Control: Lidocaine 4% T opical Solution T Area Debrided (L x W): otal 0.8 (cm) x 0.7 (cm) = 0.56 (cm) Tissue and other material debrided: Viable, Non-Viable, Callus, Subcutaneous, Skin: Epidermis Level: Skin/Subcutaneous Tissue Debridement Description:  Excisional Instrument: Curette Bleeding: Minimum Hemostasis Achieved: Pressure Procedural Pain: 0 Post Procedural Pain: 0 Response to Treatment: Procedure was tolerated well Level of Consciousness (Post- Awake and Alert procedure): Post Debridement Measurements of Total Wound Length: (cm) 0.4 Width: (cm) 0.4 Depth: (cm) 0.1 Volume: (cm) 0.013 Character of Wound/Ulcer Post Debridement: Improved Severity of Tissue Post Debridement: Fat layer exposed Post Procedure Diagnosis Same as Pre-procedure Notes Scribed for Dr. Celine Ahr by Baruch Gouty, RN Electronic Signature(s) Signed: 03/06/2022 3:13:36 PM By: Fredirick Maudlin MD FACS Signed: 03/06/2022 5:05:54 PM By: Baruch Gouty  RN, BSN Entered By: Baruch Gouty on 03/06/2022 14:21:44 [name] ([id number]) [id number] Page 3 of 10 -------------------------------------------------------------------------------- HPI Details Patient Name: Date of Service: Samuel Pigg MA R 03/06/2022 1:15 PM Medical Record Number: [id number] Patient Account Number: [id number] Date of Birth/Sex: Treating RN: 21-May-1965 (57 y.o. M) Primary Care Provider: Nani Gasser Other Clinician: Referring Provider: Treating Provider/Extender: San Morelle in Treatment: 4 History of Present Illness HPI Description: ADMISSION 02/02/2022 This is a 57 year old [place] man who speaks only Arabic. The visit today was conducted with the assistance of the language line interpreter; the patient declines the use of an in person interpreter. He is a poorly controlled diabetic (last hemoglobin A1c 10.8, but it has been as high as 15 in the past) with CKD stage III and hypertension. 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 He required revision of the right TMA in October. This was done in the [facility]. It is not  entirely clear how he was referred to our clinic however he has openings on the distal portion of the right foot as well as drainage coming from the plantar aspect of his left foot underneath some callus. The patient has just been applying dry gauze to both areas. 02/09/2022: The left-sided wound is quite a bit smaller today, but is covered with callus. The right sided wound has also contracted a little bit and is very clean, but there is also callus accumulation around the perimeter. Unfortunately, due to his Medicaid status, we have been unable to secure home health assistance and he has been changing the dressings on his own. He also has difficulty obtaining dressing supplies. 02/19/2022: Both wounds are smaller, particularly on the right. The left is covered with a layer of callus. No concern for infection. 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 There is light slough on the surface with some surrounding callus. The left plantar foot wound has callused over. Underneath the callus, the there is a small opening in the skin. 03/06/2022: The wounds are about the same size this week. There is callus accumulation around each, but the surfaces are cleaner. Electronic Signature(s) Signed: 03/06/2022 2:28:46 PM By: Fredirick Maudlin MD FACS Entered By: Fredirick Maudlin on 03/06/2022 14:28:46 -------------------------------------------------------------------------------- Physical Exam Details Patient Name: Date of Service: Samuel Pigg MA R 03/06/2022 1:15 PM Medical Record Number: [id number] Patient Account Number: [id number] Date of Birth/Sex: Treating RN: 08-08-1965 (57 y.o. M) Primary Care Provider: Nani Gasser Other Clinician: Referring Provider: Treating Provider/Extender: San Morelle in Treatment: 4 Constitutional . . . . no acute distress. Respiratory Normal work of breathing on room air. Notes 03/06/2022: The wounds are about  the same size this week. There is callus accumulation around each, but the surfaces are cleaner. Electronic Signature(s) Signed: 03/06/2022 2:32:47 PM By: Fredirick Maudlin MD FACS Entered By: Fredirick Maudlin on 03/06/2022 14:32:47 Alene Mires, [name] ([id number]) [id number] Page 4 of 10 -------------------------------------------------------------------------------- Physician Orders Details Patient Name: Date of Service: Samuel Pigg MA R 03/06/2022 1:15 PM Medical Record Number: [id number] Patient Account Number: [id number] Date of Birth/Sex: Treating RN: 1965/09/24 (57 y.o. 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 - Dr. Celine Ahr +++ INTERPRETER [facility] Return A RM 3 Wednesday 2/14 @ 3:00 pm Anesthetic Wound #1 Left Amputation Site - Transmetatarsal (In clinic) Topical Lidocaine 4% applied to wound bed Bathing/ Shower/ Hygiene May shower and wash wound with soap and water. Off-Loading Wound #2 Right Amputation Site - Transmetatarsal DH Walker Boot to: Wound Treatment Wound #1 - Amputation Site - Transmetatarsal Wound Laterality: Left Cleanser: Soap and Water 1 x Per Day/30 Days Discharge Instructions: May shower and wash wound with dial antibacterial soap and water prior to dressing change. Peri-Wound Care: Sween Lotion (Moisturizing lotion) 1 x Per Day/30 Days Discharge Instructions: Apply moisturizing lotion  as directed Prim Dressing: Sorbalgon AG Dressing, 4x4 (in/in) 1 x Per Day/30 Days ary Discharge Instructions: Apply to wound bed as instructed Secondary Dressing: ABD Pad, 8x10 1 x Per Day/30 Days Discharge Instructions: Apply over primary dressing as directed. Secondary Dressing: Optifoam Non-Adhesive Dressing, 4x4 in 1 x Per Day/30 Days Discharge Instructions: Apply over primary dressing cut to make foam donut Secondary Dressing: Woven Gauze Sponge, Non-Sterile 4x4 in 1 x Per Day/30 Days Discharge Instructions: Apply over primary dressing as directed. Secured With: Elastic Bandage 4 inch (ACE bandage) 1 x Per Day/30 Days Discharge Instructions: Secure with ACE bandage as directed. Secured With: [employer], 4.5x3.1 (in/yd) 1 x Per Day/30 Days Discharge Instructions: Secure with Kerlix as directed. Secured With: 4M Medipore H Soft Cloth Surgical T ape, 4 x 10 (in/yd) 1 x Per Day/30 Days Discharge Instructions: Secure with tape as directed. Wound #2 - Amputation Site - Transmetatarsal Wound Laterality: Right Cleanser: Soap and Water 1 x Per Day/30 Days Discharge Instructions: May shower and wash wound with dial antibacterial soap and water prior to dressing change. Peri-Wound Care: Sween Lotion (Moisturizing lotion) 1 x Per Day/30 Days JONUEL, BUTTERFIELD ([id number]) [id number] Page 5 of 10 Discharge Instructions: Apply moisturizing lotion as directed Prim Dressing: Sorbalgon AG Dressing, 4x4 (in/in) 1 x Per Day/30 Days ary Discharge Instructions: Apply to wound bed as instructed Secondary Dressing: ABD Pad, 8x10 1 x Per Day/30 Days Discharge Instructions: Apply over primary dressing as directed. Secondary Dressing: Optifoam Non-Adhesive Dressing, 4x4 in 1 x Per Day/30 Days Discharge Instructions: Apply over primary dressing cut to make foam donut Secondary Dressing: Woven Gauze Sponge, Non-Sterile 4x4 in 1 x Per Day/30 Days Discharge Instructions: Apply  over primary dressing as directed. Secured With: Elastic Bandage 4 inch (ACE bandage) 1 x Per Day/30 Days Discharge Instructions: Secure with ACE bandage as directed. Secured With: [employer], 4.5x3.1 (in/yd) 1 x Per Day/30 Days Discharge Instructions: Secure with Kerlix as directed. Secured With: 4M Medipore H Soft Cloth Surgical T ape, 4 x 10 (in/yd) 1 x Per Day/30 Days Discharge Instructions: Secure with tape as directed. Patient Medications llergies: Novolog U-100 Insulin aspart, penicillin A Notifications Medication Indication Start End prior to debridement 03/06/2022 lidocaine DOSE topical 4 % cream - cream topical Electronic Signature(s) Signed: 03/06/2022 3:13:36 PM By: Fredirick Maudlin MD FACS Entered By: Fredirick Maudlin on 03/06/2022 14:33:32 -------------------------------------------------------------------------------- Problem List Details Patient Name: Date of Service: Samuel Pigg MA R 03/06/2022 1:15 PM Medical Record Number: [id number] Patient Account Number: [id number] Date of Birth/Sex: Treating RN: January 17, 1966 (57 y.o. 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 02/02/2022 No Yes without evidence of necrosis L97.522 Non-pressure chronic ulcer of other part of left foot with fat layer exposed 02/02/2022 No Yes E11.621 Type 2 diabetes mellitus with foot ulcer 02/02/2022 No Yes Z89.439 Acquired absence of unspecified foot 02/02/2022 No Yes Gulick, Pat ([id number]) [id number] Page 6 of 10 Inactive Problems Resolved Problems Electronic Signature(s) Signed: 03/06/2022 2:27:40 PM By: Fredirick Maudlin MD FACS Entered By: Fredirick Maudlin on  03/06/2022 14:27:40 -------------------------------------------------------------------------------- Progress Note Details Patient Name: Date of Service: Samuel Pigg MA R 03/06/2022 1:15 PM Medical Record Number: [id number] Patient Account Number: [id number] Date of Birth/Sex: Treating RN: 03/29/1965 (57 y.o. 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 The visit today was conducted with the assistance of the language line interpreter; the patient declines the use of an in person interpreter. He is a poorly controlled diabetic (last hemoglobin A1c 10.8, but it has been as high as 15 in the past) with CKD stage III and hypertension. 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 He required revision of the right TMA in October. This was done in the [place] system. It is not entirely clear how he was referred to our clinic however he has openings on the distal portion of the right foot as well as drainage coming from the plantar aspect of his left foot underneath some callus. The patient has just been applying dry gauze to both areas. 02/09/2022: The left-sided wound is quite a bit smaller today, but is covered with callus. The right sided wound has also contracted a little bit and is very clean, but there is also callus accumulation around the perimeter. Unfortunately, due to his Medicaid status, we have been unable to  secure home health assistance and he has been changing the dressings on his own. He also has difficulty obtaining dressing supplies. 02/19/2022: Both wounds are smaller, particularly on the right. The left is covered with a layer of callus. No concern for infection. 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 There is light slough on the surface with some surrounding callus. The left plantar foot wound has callused over. Underneath the callus, the there is a small opening in the skin. 03/06/2022: The wounds are about the same size this week. There is callus accumulation around each, but the surfaces are cleaner. Patient History Family History Unknown History, Cancer, No family history of Diabetes, Heart Disease, Hereditary Spherocytosis, Hypertension, Kidney Disease, Lung Disease, Seizures, Stroke, Thyroid Problems, Tuberculosis. Social History Never smoker, Marital Status - Married, Alcohol Use - Never, Drug Use - No History, Caffeine Use - Rarely. Medical History Hematologic/Lymphatic Patient has history of Lymphedema Cardiovascular Patient has history of Hypertension Endocrine Patient has history of Type II Diabetes Neurologic Patient has history of Neuropathy Medical A Surgical History Notes nd Genitourinary AKI KEVYN, BOQUET ([id number]) [id number] Page 7 of 10 Objective Constitutional no acute distress. Vitals Time Taken: 1:41 AM, Height:  72 in, Weight: 214 lbs, BMI: 29, Temperature: 98.4 F, Pulse: 92 bpm, Respiratory Rate: 20 breaths/min, Blood Pressure: 130/77 mmHg, Capillary Blood Glucose: 125 mg/dl. General Notes: glucose per pt report this am Respiratory Normal work of breathing on room air. General Notes: 03/06/2022: The wounds are about the same size this week. There is callus accumulation around each, but the surfaces are cleaner. Integumentary (Hair, Skin) Wound #1 status is Open. Original cause of wound was  Surgical Injury. The date acquired was: 02/01/2022. The wound has been in treatment 4 weeks. The wound is located on the Left Amputation Site - Transmetatarsal. The wound measures 0.2cm length x 0.2cm width x 0.3cm depth; 0.031cm^2 area and 0.009cm^3 volume. There is Fat Layer (Subcutaneous Tissue) exposed. There is no tunneling noted, however, there is undermining starting at 12:00 and ending at 12:00 with a maximum distance of 0.2cm. There is a small amount of serosanguineous drainage noted. The wound margin is well defined and not attached to the wound base. There is medium (34-66%) pink granulation within the wound bed. There is a small (1-33%) amount of necrotic tissue within the wound bed including Adherent Slough. The periwound skin appearance had no abnormalities noted for color. The periwound skin appearance exhibited: Callus, Dry/Scaly. The periwound skin appearance did not exhibit: Maceration. Periwound temperature was noted as No Abnormality. Wound #2 status is Open. Original cause of wound was Surgical Injury. The date acquired was: 10/30/2021. The wound has been in treatment 4 weeks. The wound is located on the Right Amputation Site - Transmetatarsal. The wound measures 1.8cm length x 1.4cm width x 0.3cm depth; 1.979cm^2 area and 0.594cm^3 volume. There is Fat Layer (Subcutaneous Tissue) exposed. There is no tunneling or undermining noted. There is a medium amount of serosanguineous drainage noted. The wound margin is thickened. There is large (67-100%) red granulation within the wound bed. There is a small (1-33%) amount of necrotic tissue within the wound bed including Adherent Slough. The periwound skin appearance had no abnormalities noted for moisture. The periwound skin appearance had no abnormalities noted for color. The periwound skin appearance exhibited: Callus. Periwound temperature was noted as No Abnormality. Assessment Active Problems ICD-10 Non-pressure chronic ulcer of  other part of right foot with muscle involvement without evidence of necrosis Non-pressure chronic ulcer of other part of left foot with fat layer exposed Type 2 diabetes mellitus with foot ulcer Acquired absence of unspecified foot Procedures Wound #1 Pre-procedure diagnosis of Wound #1 is a Diabetic Wound/Ulcer of the Lower Extremity located on the Left Amputation Site - Transmetatarsal .Severity of Tissue Pre Debridement is: Fat layer exposed. There was a Excisional Skin/Subcutaneous Tissue Debridement with a total area of 0.56 sq cm performed by Fredirick Maudlin, MD. With the following instrument(s): Curette to remove Viable and Non-Viable tissue/material. Material removed includes Callus, Subcutaneous Tissue, and Skin: Epidermis after achieving pain control using Lidocaine 4% T opical Solution. No specimens were taken. A time out was conducted at 14:15, prior to the start of the procedure. A Minimum amount of bleeding was controlled with Pressure. The procedure was tolerated well with a pain level of 0 throughout and a pain level of 0 following the procedure. Post Debridement Measurements: 0.4cm length x 0.4cm width x 0.1cm depth; 0.013cm^3 volume. Character of Wound/Ulcer Post Debridement is improved. Severity of Tissue Post Debridement is: Fat layer exposed. Post procedure Diagnosis Wound #1: Same as Pre-Procedure General Notes: Scribed for Dr. Celine Ahr by Baruch Gouty, RN. Wound #2 Pre-procedure diagnosis of Wound #2  is a Diabetic Wound/Ulcer of the Lower Extremity located on the Right Amputation Site - Transmetatarsal .Severity of Tissue Pre Debridement is: Fat layer exposed. There was a Selective/Open Wound Skin/Epidermis Debridement with a total area of 4 sq cm performed by Fredirick Maudlin, MD. With the following instrument(s): Curette to remove Non-Viable tissue/material. Material removed includes Callus and Skin: Epidermis and after achieving pain control using Lidocaine 4% Topical  Solution. No specimens were taken. A time out was conducted at 14:15, prior to the start of the procedure. A Minimum amount of bleeding was controlled with Pressure. The procedure was tolerated well with a pain level of 0 throughout and a pain level of 0 following the procedure. Post Debridement Measurements: 1.8cm length x 1.4cm width x 0.3cm depth; 0.594cm^3 volume. Character of Wound/Ulcer Post Debridement is improved. Severity of Tissue Post Debridement is: Fat layer exposed. Post procedure Diagnosis Wound #2: Same as Pre-Procedure General Notes: Scribed for Dr. Celine Ahr by Baruch Gouty, RN. [facility] [place], [place] ([id number]) [id number] Page 8 of 10 Follow-up Appointments: Return Appointment in 1 week. - Dr. Celine Ahr +++ INTERPRETER [facility] RM 3 Wednesday 2/14 @ 3:00 pm Anesthetic: Wound #1 Left Amputation Site - Transmetatarsal: (In clinic) Topical Lidocaine 4% applied to wound bed Bathing/ Shower/ Hygiene: May shower and wash wound with soap and water. Off-Loading: Wound #2 Right Amputation Site - Transmetatarsal: DH Walker Boot to: The following medication(s) was prescribed: lidocaine topical 4 % cream cream topical for prior to debridement was prescribed at facility WOUND #1: - Amputation Site - Transmetatarsal Wound Laterality: Left Cleanser: Soap and Water 1 x Per Day/30 Days Discharge Instructions: May shower and wash wound with dial antibacterial soap and water prior to dressing change. Peri-Wound Care: Sween Lotion (Moisturizing lotion) 1 x Per Day/30 Days Discharge Instructions: Apply moisturizing lotion as directed Prim Dressing: Sorbalgon AG Dressing, 4x4 (in/in) 1 x Per Day/30 Days ary Discharge Instructions: Apply to wound bed as instructed Secondary Dressing: ABD Pad, 8x10 1 x Per Day/30 Days Discharge Instructions: Apply over primary dressing as directed. Secondary Dressing: Optifoam Non-Adhesive Dressing, 4x4 in 1 x Per Day/30  Days Discharge Instructions: Apply over primary dressing cut to make foam donut Secondary Dressing: Woven Gauze Sponge, Non-Sterile 4x4 in 1 x Per Day/30 Days Discharge Instructions: Apply over primary dressing as directed. Secured With: Elastic Bandage 4 inch (ACE bandage) 1 x Per Day/30 Days Discharge Instructions: Secure with ACE bandage as directed. Secured With: [employer], 4.5x3.1 (in/yd) 1 x Per Day/30 Days Discharge Instructions: Secure with Kerlix as directed. Secured With: 65M Medipore H Soft Cloth Surgical T ape, 4 x 10 (in/yd) 1 x Per Day/30 Days Discharge Instructions: Secure with tape as directed. WOUND #2: - Amputation Site - Transmetatarsal Wound Laterality: Right Cleanser: Soap and Water 1 x Per Day/30 Days Discharge Instructions: May shower and wash wound with dial antibacterial soap and water prior to dressing change. Peri-Wound Care: Sween Lotion (Moisturizing lotion) 1 x Per Day/30 Days Discharge Instructions: Apply moisturizing lotion as directed Prim Dressing: Sorbalgon AG Dressing, 4x4 (in/in) 1 x Per Day/30 Days ary Discharge Instructions: Apply to wound bed as instructed Secondary Dressing: ABD Pad, 8x10 1 x Per Day/30 Days Discharge Instructions: Apply over primary dressing as directed. Secondary Dressing: Optifoam Non-Adhesive Dressing, 4x4 in 1 x Per Day/30 Days Discharge Instructions: Apply over primary dressing cut to make foam donut Secondary Dressing: Woven Gauze Sponge, Non-Sterile 4x4 in 1 x Per Day/30 Days Discharge Instructions: Apply over primary dressing as directed. Secured With:  Elastic Bandage 4 inch (ACE bandage) 1 x Per Day/30 Days Discharge Instructions: Secure with ACE bandage as directed. Secured With: [employer], 4.5x3.1 (in/yd) 1 x Per Day/30 Days Discharge Instructions: Secure with Kerlix as directed. Secured With: 50M Medipore H Soft Cloth Surgical T ape, 4 x 10 (in/yd) 1 x Per Day/30 Days Discharge Instructions: Secure  with tape as directed. 03/06/2022: The wounds are about the same size this week. There is callus accumulation around each, but the surfaces are cleaner. I used a curette to debride callus and epidermis from the transmetatarsal site on the right. I debrided callus and nonviable subcutaneous tissue from the first metatarsal head wound on the left. We will continue silver alginate to both sites. Follow-up in 1 week. Electronic Signature(s) Signed: 03/06/2022 2:34:15 PM By: Fredirick Maudlin MD FACS Entered By: Fredirick Maudlin on 03/06/2022 14:34:15 -------------------------------------------------------------------------------- HxROS Details Patient Name: Date of Service: Samuel Pigg MA R 03/06/2022 1:15 PM Medical Record Number: [id number] Patient Account Number: [id number] Date of Birth/Sex: Treating RN: 27-May-1965 (56 y.o. M) Primary Care Provider: Nani Gasser Other Clinician: Referring Provider: Treating Provider/Extender: [place] Morelle in Treatment: 4 Hematologic/Lymphatic Medical History: [name] ([id number]) [id number] Page 9 of 10 Positive for: Lymphedema Cardiovascular Medical History: Positive for: Hypertension Endocrine Medical History: Positive for: Type II Diabetes Treated with: Insulin, Oral agents Genitourinary Medical History: Past Medical History Notes: AKI Neurologic Medical History: Positive for: Neuropathy Immunizations Pneumococcal Vaccine: Received Pneumococcal Vaccination: Yes Received Pneumococcal Vaccination On or After 60th Birthday: Yes Implantable Devices No devices added Family and Social History Unknown History: Yes; Cancer: Yes; Diabetes: No; Heart Disease: No; Hereditary Spherocytosis: No; Hypertension: No; Kidney Disease: No; Lung Disease: No; Seizures: No; Stroke: No; Thyroid Problems: No; Tuberculosis: No; Never smoker; Marital Status - Married; Alcohol Use: Never; Drug Use:  No History; Caffeine Use: Rarely; Financial Concerns: No; Food, Clothing or Shelter Needs: No; Support System Lacking: No; Transportation Concerns: No Electronic Signature(s) Signed: 03/06/2022 3:13:36 PM By: Fredirick Maudlin MD FACS Entered By: Fredirick Maudlin on 03/06/2022 14:32:15 -------------------------------------------------------------------------------- SuperBill Details Patient Name: Date of Service: Samuel Pigg MA R 03/06/2022 Medical Record Number: [id number] Patient Account Number: [id number] Date of Birth/Sex: Treating RN: 01-Nov-1965 (57 y.o. 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 E11.621 Type 2 diabetes mellitus with foot ulcer Z89.439 Acquired absence of unspecified foot Facility Procedures : [place], [place] CPT4 Code: [id number] [place] ([id number]) L Description: 11042 - DEB SUBQ TISSUE 20 SQ CM/< ICD-10 Diagnosis Description [id number] 97.522 Non-pressure chronic ulcer of other part of left foot with fat layer exposed Modifier: [id number] Quantity: 1 7.pdf Page 10 of 10 : 7 CPT4 Code: 0160109 323 IC L Description: 97 - DEBRIDE WOUND 1ST 20 SQ CM OR < D-10 Diagnosis Description 97.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 without Modifier: evidence of ne Quantity: 1 crosis Physician Procedures : CPT4 Code Description Modifier [id number] 25427 - WC PHYS LEVEL 3 - EST PT 25 ICD-10 Diagnosis Description 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 without evidence of ne L97.522 Non-pressure chronic ulcer of  other part of  left foot with fat layer exposed  E11.621 Type 2 diabetes mellitus with foot ulcer Z89.439 Acquired absence of unspecified foot Quantity: 1 crosis : [id number] 01040 - WC PHYS SUBQ TISS 20 SQ CM ICD-10 Diagnosis Description L97.522 Non-pressure chronic ulcer of other part of left foot with fat layer exposed Quantity: 1 : [id number] 59923 - WC PHYS DEBR WO ANESTH 20 SQ CM ICD-10 Diagnosis Description 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

## 2022-03-07 NOTE — Progress Notes (Signed)
Ravalli, [name] ([id number]) [id number] Page 1 of 9 Visit Report for 03/06/2022 Arrival Information Details Patient Name: Date of Service: Samuel Pigg MA R 03/06/2022 1:15 PM Medical Record Number: [id number] Patient Account Number: [id number] Date of Birth/Sex: Treating RN: 1965/02/22 (57 y.o. M) Primary Care Marti Mclane: Nani Gasser Other Clinician: Referring Christasia Angeletti: Treating Faydra Korman/Extender: [place] in Treatment: 4 Visit Information History Since Last Visit All ordered tests and consults were completed: No Patient Arrived: Ambulatory Added or deleted any medications: No Arrival Time: 13:41 Any new allergies or adverse reactions: No Accompanied By: son Had a fall or experienced change in No Transfer Assistance: None activities of daily living that may affect Patient Identification Verified: Yes risk of falls: Secondary Verification Process Completed: Yes Signs or symptoms of abuse/neglect since last visito No Patient Requires Transmission-Based Precautions: No Hospitalized since last visit: No Implantable device outside of the clinic excluding No cellular tissue based products placed in the center since last visit: Has Dressing in Place as Prescribed: Yes Pain Present Now: No Electronic Signature(s) Signed: 03/06/2022 5:05:54 PM By: Baruch Gouty RN, BSN Entered By: Baruch Gouty on 03/06/2022 14:07:50 -------------------------------------------------------------------------------- Encounter Discharge Information Details Patient Name: Date of Service: Samuel Pigg MA R 03/06/2022 1:15 PM Medical Record Number: [id number] Patient Account Number: [id number] Date of Birth/Sex: Treating RN: 19-Nov-1965 (57 y.o. [name] Primary Care Miles Leyda: Nani Gasser Other Clinician: Referring Johann Santone: Treating Marshel Golubski/Extender: [place] in Treatment: 4 Encounter Discharge Information  Items Post Procedure Vitals Discharge Condition: Stable Temperature (F): 98.4 Ambulatory Status: Ambulatory Pulse (bpm): 92 Discharge Destination: Home Respiratory Rate (breaths/min): 18 Transportation: Private Auto Blood Pressure (mmHg): 130/77 Accompanied By: son Schedule Follow-up Appointment: Yes Clinical Summary of Care: Patient Declined Electronic Signature(s) Signed: 03/06/2022 5:05:54 PM By: Baruch Gouty RN, BSN Entered By: Baruch Gouty on 03/06/2022 14:43:14 Alene Mires, [name] ([id number]) [id number] Page 2 of 9 -------------------------------------------------------------------------------- Lower Extremity Assessment Details Patient Name: Date of Service: Samuel Pigg MA R 03/06/2022 1:15 PM Medical Record Number: [id number] Patient Account Number: [id number] Date of Birth/Sex: Treating RN: 1965-05-08 (57 y.o. [name] Primary Care [name]: Nani Gasser Other Clinician: Referring Camyla Camposano: Treating Maclane Holloran/Extender: [place] in Treatment: 4 Edema Assessment Assessed: Shirlyn Goltz: No] Patrice Paradise: No] Edema: [Left: No] [Right: No] Calf Left: Right: Point of Measurement: From Medial Instep 36 cm 35 cm Ankle Left: Right: Point of Measurement: From Medial Instep 24 cm 23.5 cm Vascular Assessment Pulses: Dorsalis Pedis Palpable: [Left:Yes] [Right:Yes] Electronic Signature(s) Signed: 03/06/2022 5:05:54 PM By: Baruch Gouty RN, BSN Entered By: Baruch Gouty on 03/06/2022 14:08:56 -------------------------------------------------------------------------------- Multi Wound Chart Details Patient Name: Date of Service: Samuel Pigg MA R 03/06/2022 1:15 PM Medical Record Number: [id number] Patient Account Number: [id number] Date of Birth/Sex: Treating RN: 01/08/1966 (57 y.o. M) Primary Care Brayden Betters: Nani Gasser Other Clinician: Referring Kenadi Miltner: Treating Adrienne Delay/Extender: [place] in Treatment: 4 Vital Signs Height(in): 72 Capillary Blood Glucose(mg/dl): 125 Weight(lbs): 214 Pulse(bpm): 92 Body Mass Index(BMI): 29 Blood Pressure(mmHg): 130/77 Temperature(F): 98.4 Respiratory Rate(breaths/min): 20 [1:Photos:] [N/A:N/A [id number] Page 3 of 9] Left Amputation Site - Transmetatarsal Right Amputation Site - N/A Wound Location: Transmetatarsal Surgical Injury Surgical Injury N/A Wounding Event: Diabetic Wound/Ulcer of the Lower Diabetic Wound/Ulcer of the Lower N/A Primary Etiology: Extremity Extremity Lymphedema, Hypertension, Type II Lymphedema, Hypertension, Type II N/A Comorbid History: Diabetes, Neuropathy Diabetes, Neuropathy 02/01/2022 10/30/2021 N/A Date Acquired: 4 4 N/A Weeks of Treatment: Open Open N/A  Wound Status: No No N/A Wound Recurrence: 0.2x0.2x0.3 1.8x1.4x0.3 N/A Measurements L x W x D (cm) 0.031 1.979 N/A A (cm) : rea 0.009 0.594 N/A Volume (cm) : 98.00% 84.00% N/A % Reduction in A rea: 94.30% 52.00% N/A % Reduction in Volume: 12 Starting Position 1 (o'clock): 12 Ending Position 1 (o'clock): 0.2 Maximum Distance 1 (cm): Yes No N/A Undermining: Grade 1 Grade 1 N/A Classification: Small Medium N/A Exudate A mount: Serosanguineous Serosanguineous N/A Exudate Type: red, brown red, brown N/A Exudate Color: Well defined, not attached Thickened N/A Wound Margin: Medium (34-66%) Large (67-100%) N/A Granulation A mount: Pink Red N/A Granulation Quality: Small (1-33%) Small (1-33%) N/A Necrotic A mount: Fat Layer (Subcutaneous Tissue): Yes Fat Layer (Subcutaneous Tissue): Yes N/A Exposed Structures: Fascia: No Fascia: No Tendon: No Tendon: No Muscle: No Muscle: No Joint: No Joint: No Bone: No Bone: No None Small (1-33%) N/A Epithelialization: Debridement - Excisional Debridement - Selective/Open Wound N/A Debridement: Pre-procedure Verification/Time Out 14:15 14:15  N/A Taken: Lidocaine 4% Topical Solution Lidocaine 4% Topical Solution N/A Pain Control: Callus, Subcutaneous Callus N/A Tissue Debrided: Skin/Subcutaneous Tissue Skin/Epidermis N/A Level: 0.56 4 N/A Debridement A (sq cm): rea Curette Curette N/A Instrument: Minimum Minimum N/A Bleeding: Pressure Pressure N/A Hemostasis A chieved: 0 0 N/A Procedural Pain: 0 0 N/A Post Procedural Pain: Procedure was tolerated well Procedure was tolerated well N/A Debridement Treatment Response: 0.4x0.4x0.1 1.8x1.4x0.3 N/A Post Debridement Measurements L x W x D (cm) 0.013 0.594 N/A Post Debridement Volume: (cm) Callus: Yes Callus: Yes N/A Periwound Skin Texture: Dry/Scaly: Yes Maceration: Yes N/A Periwound Skin Moisture: Maceration: No Dry/Scaly: No No Abnormalities Noted No Abnormalities Noted N/A Periwound Skin Color: No Abnormality No Abnormality N/A Temperature: Debridement Debridement N/A Procedures Performed: Treatment Notes Electronic Signature(s) Signed: 03/06/2022 2:28:06 PM By: Fredirick Maudlin MD FACS Entered By: Fredirick Maudlin on 03/06/2022 14:28:06 -------------------------------------------------------------------------------- Multi-Disciplinary Care Plan Details Patient Name: Date of Service: Samuel Pigg MA R 03/06/2022 1:15 PM Medical Record Number: [id number] Patient Account Number: [id number] Date of Birth/Sex: Treating RN: [birth year] (58 y.o. [name] Primary Care Holland Nickson: Nani Gasser Other Clinician: Referring Esdras Delair: Treating Brailynn Breth/Extender: [place] in Treatment: [place], [place] ([id number]) [id number] Page 4 of 9 Multidisciplinary Care Plan reviewed with physician Active Inactive Nutrition Nursing Diagnoses: Impaired glucose control: actual or potential Goals: Patient/caregiver verbalizes understanding of need to maintain therapeutic glucose control per primary care  physician Date Initiated: 02/02/2022 Target Resolution Date: 03/29/2022 Goal Status: Active Interventions: Assess patient nutrition upon admission and as needed per policy Provide education on elevated blood sugars and impact on wound healing Treatment Activities: Dietary management education, guidance and counseling : 02/02/2022 Notes: Wound/Skin Impairment Nursing Diagnoses: Impaired tissue integrity Goals: Patient/caregiver will verbalize understanding of skin care regimen Date Initiated: 03/06/2022 Target Resolution Date: 03/29/2022 Goal Status: Active Ulcer/skin breakdown will have a volume reduction of 30% by week 4 Date Initiated: 02/02/2022 Target Resolution Date: 03/29/2022 Goal Status: Active Interventions: Assess ulceration(s) every visit Provide education on ulcer and skin care Treatment Activities: Patient referred to home care : 02/02/2022 Skin care regimen initiated : 02/02/2022 Notes: Electronic Signature(s) Signed: 03/06/2022 5:05:54 PM By: Baruch Gouty RN, BSN Entered By: Baruch Gouty on 03/06/2022 14:15:18 -------------------------------------------------------------------------------- Pain Assessment Details Patient Name: Date of Service: Samuel Pigg MA R 03/06/2022 1:15 PM Medical Record Number: [id number] Patient Account Number: [id number] Date of Birth/Sex: Treating RN: Jun 11, 1965 (57 y.o. M) Primary Care Tashanna Dolin: Nani Gasser Other Clinician: Referring Nikoleta Dady: Treating Tamieka Rancourt/Extender:  Marcina Millard Weeks in Treatment: 4 Active Problems Location of Pain Severity and Description of Pain Patient Has Paino No Site Locations Rate the pain. [place], [name] ([id number]) [id number] Page 5 of 9 Rate the pain. Current Pain Level: 0 Pain Management and Medication Current Pain Management: Electronic Signature(s) Signed: 03/06/2022 5:05:54 PM By: Baruch Gouty RN, BSN Entered By: Baruch Gouty on 03/06/2022  14:08:28 -------------------------------------------------------------------------------- Patient/Caregiver Education Details Patient Name: Date of Service: Samuel Pigg MA R 2/6/2024andnbsp1:15 PM Medical Record Number: [id number] Patient Account Number: [id number] Date of Birth/Gender: Treating RN: 11/17/1965 (57 y.o. [name] Primary Care Physician: Nani Gasser Other Clinician: Referring Physician: Treating Physician/Extender: [place] in Treatment: 4 Education Assessment Education Provided To: Patient Education Topics Provided Offloading: Methods: Explain/Verbal Responses: Reinforcements needed, State content correctly Wound/Skin Impairment: Methods: Explain/Verbal Responses: Reinforcements needed, State content correctly Electronic Signature(s) Signed: 03/06/2022 5:05:54 PM By: Baruch Gouty RN, BSN Entered By: Baruch Gouty on 03/06/2022 14:15:41 -------------------------------------------------------------------------------- Wound Assessment Details Patient Name: Date of Service: Samuel Pigg MA R 03/06/2022 1:15 PM Alene Mires, [name] ([id number]) [id number] Page 6 of 9 Medical Record Number: [id number] Patient Account Number: [id number] Date of Birth/Sex: Treating RN: 09-Jun-1965 (57 y.o. M) Primary Care Blanca Thornton: Nani Gasser Other Clinician: Referring Ilah Boule: Treating Kai Calico/Extender: [place] in Treatment: 4 Wound Status Wound Number: 1 Primary Etiology: Diabetic Wound/Ulcer of the Lower Extremity Wound Location: Left Amputation Site - Transmetatarsal Wound Status: Open Wounding Event: Surgical Injury Comorbid Lymphedema, Hypertension, Type II Diabetes, Neuropathy History: Date Acquired: 02/01/2022 Weeks Of Treatment: 4 Clustered Wound: No Photos Wound Measurements Length: (cm) 0.2 Width: (cm) 0.2 Depth: (cm) 0.3 Area: (cm) 0.031 Volume: (cm) 0.009 %  Reduction in Area: 98% % Reduction in Volume: 94.3% Epithelialization: None Tunneling: No Undermining: Yes Starting Position (o'clock): 12 Ending Position (o'clock): 12 Maximum Distance: (cm) 0.2 Wound Description Classification: Grade 1 Wound Margin: Well defined, not attached Exudate Amount: Small Exudate Type: Serosanguineous Exudate Color: red, brown Foul Odor After Cleansing: No Slough/Fibrino No Wound Bed Granulation Amount: Medium (34-66%) Exposed Structure Granulation Quality: Pink Fascia Exposed: No Necrotic Amount: Small (1-33%) Fat Layer (Subcutaneous Tissue) Exposed: Yes Necrotic Quality: Adherent Slough Tendon Exposed: No Muscle Exposed: No Joint Exposed: No Bone Exposed: No Periwound Skin Texture Texture Color No Abnormalities Noted: No No Abnormalities Noted: Yes Callus: Yes Temperature / Pain Temperature: No Abnormality Moisture No Abnormalities Noted: No Dry / Scaly: Yes Maceration: No Treatment Notes Wound #1 (Amputation Site - Transmetatarsal) Wound Laterality: Left Cleanser Soap and Water Discharge Instruction: May shower and wash wound with dial antibacterial soap and water prior to dressing change. Peri-Wound Care [place], [place] ([id number]) [id number] Page 7 of 9 Sween Lotion (Moisturizing lotion) Discharge Instruction: Apply moisturizing lotion as directed Topical Primary Dressing [employer] Dressing, 4x4 (in/in) Discharge Instruction: Apply to wound bed as instructed Secondary Dressing ABD Pad, 8x10 Discharge Instruction: Apply over primary dressing as directed. Optifoam Non-Adhesive Dressing, 4x4 in Discharge Instruction: Apply over primary dressing cut to make foam donut Woven Gauze Sponge, Non-Sterile 4x4 in Discharge Instruction: Apply over primary dressing as directed. Secured With Elastic Bandage 4 inch (ACE bandage) Discharge Instruction: Secure with ACE bandage as directed. Kerlix Roll Sterile,  4.5x3.1 (in/yd) Discharge Instruction: Secure with Kerlix as directed. 62M Medipore H Soft Cloth Surgical T ape, 4 x 10 (in/yd) Discharge Instruction: Secure with tape as directed. Compression Wrap Compression Stockings Add-Ons Electronic Signature(s) Signed: 03/06/2022 5:05:54 PM By: Baruch Gouty RN, BSN Entered  By: Baruch Gouty on 03/06/2022 14:11:14 -------------------------------------------------------------------------------- Wound Assessment Details Patient Name: Date of Service: Samuel Pigg MA R 03/06/2022 1:15 PM Medical Record Number: [id number] Patient Account Number: [id number] Date of Birth/Sex: Treating RN: [birth year] (58 y.o. M) Primary Care Arlando Leisinger: Nani Gasser Other Clinician: Referring Harve Spradley: Treating Merl Guardino/Extender: [place] in Treatment: 4 Wound Status Wound Number: 2 Primary Etiology: Diabetic Wound/Ulcer of the Lower Extremity Wound Location: Right Amputation Site - Transmetatarsal Wound Status: Open Wounding Event: Surgical Injury Comorbid Lymphedema, Hypertension, Type II Diabetes, Neuropathy History: Date Acquired: 10/30/2021 Weeks Of Treatment: 4 Clustered Wound: No Photos [place], [name] ([id number]) [id number] Page 8 of 9 Wound Measurements Length: (cm) 1.8 Width: (cm) 1.4 Depth: (cm) 0.3 Area: (cm) 1.979 Volume: (cm) 0.594 % Reduction in Area: 84% % Reduction in Volume: 52% Epithelialization: Small (1-33%) Tunneling: No Undermining: No Wound Description Classification: Grade 1 Wound Margin: Thickened Exudate Amount: Medium Exudate Type: Serosanguineous Exudate Color: red, brown Foul Odor After Cleansing: No Slough/Fibrino Yes Wound Bed Granulation Amount: Large (67-100%) Exposed Structure Granulation Quality: Red Fascia Exposed: No Necrotic Amount: Small (1-33%) Fat Layer (Subcutaneous Tissue) Exposed: Yes Necrotic Quality: Adherent Slough Tendon Exposed:  No Muscle Exposed: No Joint Exposed: No Bone Exposed: No Periwound Skin Texture Texture Color No Abnormalities Noted: No No Abnormalities Noted: Yes Callus: Yes Temperature / Pain Temperature: No Abnormality Moisture No Abnormalities Noted: Yes Treatment Notes Wound #2 (Amputation Site - Transmetatarsal) Wound Laterality: Right Cleanser Soap and Water Discharge Instruction: May shower and wash wound with dial antibacterial soap and water prior to dressing change. Peri-Wound Care Sween Lotion (Moisturizing lotion) Discharge Instruction: Apply moisturizing lotion as directed Topical Primary Dressing [employer] Dressing, 4x4 (in/in) Discharge Instruction: Apply to wound bed as instructed Secondary Dressing ABD Pad, 8x10 Discharge Instruction: Apply over primary dressing as directed. Optifoam Non-Adhesive Dressing, 4x4 in Discharge Instruction: Apply over primary dressing cut to make foam donut Woven Gauze Sponge, Non-Sterile 4x4 in Discharge Instruction: Apply over primary dressing as directed. Secured With Elastic Bandage 4 inch (ACE bandage) Discharge Instruction: Secure with ACE bandage as directed. Kerlix Roll Sterile, 4.5x3.1 (in/yd) Discharge Instruction: Secure with Kerlix as directed. 60M Medipore H Soft Cloth Surgical T ape, 4 x 10 (in/yd) Discharge Instruction: Secure with tape as directed. Compression Wrap Compression Stockings Add-Ons Electronic Signature(s) Signed: 03/06/2022 5:05:54 PM By: Baruch Gouty RN, BSN [place], [name] ([id number]) [id number] Page 9 of 9 Signed: 03/06/2022 5:05:54 PM By: Baruch Gouty RN, BSN Entered By: Baruch Gouty on 03/06/2022 14:13:22 -------------------------------------------------------------------------------- Vitals Details Patient Name: Date of Service: Samuel Pigg MA R 03/06/2022 1:15 PM Medical Record Number: [id number] Patient Account Number: [id number] Date of Birth/Sex: Treating RN: Jun 15, 1965  (57 y.o. M) Primary Care Chelsie Burel: Nani Gasser Other Clinician: Referring Tresa Jolley: Treating Jaeley Wiker/Extender: [place] in Treatment: 4 Vital Signs Time Taken: 01:41 Temperature (F): 98.4 Height (in): 72 Pulse (bpm): 92 Weight (lbs): 214 Respiratory Rate (breaths/min): 20 Body Mass Index (BMI): 29 Blood Pressure (mmHg): 130/77 Capillary Blood Glucose (mg/dl): 125 Reference Range: 80 - 120 mg / dl Notes glucose per pt report this am Electronic Signature(s) Signed: 03/06/2022 5:05:54 PM By: Baruch Gouty RN, BSN Entered By: Baruch Gouty on 03/06/2022 14:08:17

## 2022-03-14 ENCOUNTER — Encounter (HOSPITAL_BASED_OUTPATIENT_CLINIC_OR_DEPARTMENT_OTHER): Payer: Medicaid Other | Admitting: General Surgery

## 2022-03-15 ENCOUNTER — Ambulatory Visit: Payer: Medicaid Other | Admitting: Dietician

## 2022-03-15 ENCOUNTER — Other Ambulatory Visit (HOSPITAL_COMMUNITY): Payer: Self-pay

## 2022-03-15 ENCOUNTER — Telehealth: Payer: Self-pay | Admitting: *Deleted

## 2022-03-15 ENCOUNTER — Ambulatory Visit (HOSPITAL_COMMUNITY)
Admission: RE | Admit: 2022-03-15 | Discharge: 2022-03-15 | Disposition: A | Discharge status: Home / Self Care | Payer: Medicaid Other | Source: Ambulatory Visit | Attending: Internal Medicine | Admitting: Internal Medicine

## 2022-03-15 ENCOUNTER — Ambulatory Visit: Payer: Medicaid Other

## 2022-03-15 ENCOUNTER — Other Ambulatory Visit: Payer: Self-pay

## 2022-03-15 VITALS — BP 130/68 | HR 91 | Temp 98.2°F | Ht 74.0 in | Wt 208.4 lb

## 2022-03-15 DIAGNOSIS — Z794 Long term (current) use of insulin: Secondary | ICD-10-CM

## 2022-03-15 DIAGNOSIS — N1831 Chronic kidney disease, stage 3a: Secondary | ICD-10-CM | POA: Diagnosis not present

## 2022-03-15 DIAGNOSIS — E1169 Type 2 diabetes mellitus with other specified complication: Secondary | ICD-10-CM

## 2022-03-15 DIAGNOSIS — Z0000 Encounter for general adult medical examination without abnormal findings: Secondary | ICD-10-CM

## 2022-03-15 DIAGNOSIS — E1165 Type 2 diabetes mellitus with hyperglycemia: Secondary | ICD-10-CM

## 2022-03-15 DIAGNOSIS — Z89439 Acquired absence of unspecified foot: Secondary | ICD-10-CM | POA: Diagnosis not present

## 2022-03-15 DIAGNOSIS — M869 Osteomyelitis, unspecified: Secondary | ICD-10-CM | POA: Diagnosis present

## 2022-03-15 DIAGNOSIS — Z7984 Long term (current) use of oral hypoglycemic drugs: Secondary | ICD-10-CM | POA: Diagnosis not present

## 2022-03-15 DIAGNOSIS — I129 Hypertensive chronic kidney disease with stage 1 through stage 4 chronic kidney disease, or unspecified chronic kidney disease: Secondary | ICD-10-CM | POA: Diagnosis not present

## 2022-03-15 DIAGNOSIS — M86171 Other acute osteomyelitis, right ankle and foot: Secondary | ICD-10-CM | POA: Diagnosis not present

## 2022-03-15 DIAGNOSIS — E1122 Type 2 diabetes mellitus with diabetic chronic kidney disease: Secondary | ICD-10-CM | POA: Diagnosis not present

## 2022-03-15 DIAGNOSIS — Z1211 Encounter for screening for malignant neoplasm of colon: Secondary | ICD-10-CM

## 2022-03-15 DIAGNOSIS — I10 Essential (primary) hypertension: Secondary | ICD-10-CM

## 2022-03-15 MED ORDER — GADOBUTROL 1 MMOL/ML IV SOLN
9.0000 mL | Freq: Once | INTRAVENOUS | Status: AC | PRN
  Administered 2022-03-15: 9 mL via INTRAVENOUS

## 2022-03-15 MED ORDER — INSULIN GLARGINE 100 UNIT/ML SOLOSTAR PEN
37.0000 [IU] | PEN_INJECTOR | Freq: Two times a day (BID) | SUBCUTANEOUS | 2 refills | Status: DC
  Filled 2022-03-15: qty 15, 20d supply, fill #0

## 2022-03-15 MED ORDER — TRULICITY 3 MG/0.5ML ~~LOC~~ SOAJ
3.0000 mg | SUBCUTANEOUS | 1 refills | Status: DC
  Filled 2022-03-15: qty 2, 28d supply, fill #0
  Filled 2022-06-07 – 2022-06-08 (×2): qty 2, 28d supply, fill #1

## 2022-03-15 MED ORDER — AMLODIPINE BESYLATE 5 MG PO TABS
5.0000 mg | ORAL_TABLET | Freq: Every day | ORAL | 3 refills | Status: DC
  Filled 2022-03-15 – 2022-06-07 (×2): qty 90, 90d supply, fill #0

## 2022-03-15 MED ORDER — DOXYCYCLINE HYCLATE 100 MG PO TABS
100.0000 mg | ORAL_TABLET | Freq: Two times a day (BID) | ORAL | 0 refills | Status: DC
  Filled 2022-03-15: qty 30, 15d supply, fill #0

## 2022-03-15 MED ORDER — METFORMIN HCL 1000 MG PO TABS
1000.0000 mg | ORAL_TABLET | Freq: Two times a day (BID) | ORAL | 3 refills | Status: AC
  Filled 2022-03-15: qty 180, 90d supply, fill #0
  Filled 2022-06-07: qty 180, 90d supply, fill #1
  Filled 2022-09-18: qty 180, 90d supply, fill #2
  Filled 2023-01-03: qty 180, 90d supply, fill #3

## 2022-03-15 MED ORDER — OLMESARTAN MEDOXOMIL 20 MG PO TABS
20.0000 mg | ORAL_TABLET | Freq: Every day | ORAL | 3 refills | Status: DC
  Filled 2022-03-15: qty 30, 30d supply, fill #0

## 2022-03-15 MED ORDER — ACCU-CHEK GUIDE W/DEVICE KIT
PACK | Freq: Every morning | 0 refills | Status: AC
  Filled 2022-03-15: qty 1, 1d supply, fill #0

## 2022-03-15 MED ORDER — ATORVASTATIN CALCIUM 40 MG PO TABS
40.0000 mg | ORAL_TABLET | Freq: Every day | ORAL | 3 refills | Status: AC
  Filled 2022-03-15: qty 90, 90d supply, fill #0
  Filled 2022-09-18: qty 90, 90d supply, fill #1

## 2022-03-15 MED ORDER — EMPAGLIFLOZIN 25 MG PO TABS
25.0000 mg | ORAL_TABLET | Freq: Every day | ORAL | 1 refills | Status: DC
  Filled 2022-03-15: qty 30, 30d supply, fill #0

## 2022-03-15 NOTE — Telephone Encounter (Signed)
Call from [facility] dept - stated MRI order is incorrect. If the doctor wants pt to have contrast, order needs to be change to "with and without contrast". Otherwise, order needs to be change to "without contrast". His appt is 1600 today. Thanks

## 2022-03-15 NOTE — Progress Notes (Deleted)
HTN Non-adherence long term Amlodipine 5 Olmesartan 20   T2DM Currently prescribed metformin, humalod, levemir, jardiance, dulaglutide.  -lispro 5 TID -levemir 37 BID -trulicity 1.5 weekly(can go to 3) -empaglaflozin 10 -metformin 1000 BID Referral to donna made  HLD Atorva 40  Transmetatarsal amputation of the foot Patient currently being treated for presumptive osteomyelitis. He does follow with wound care and podiatry. He saw podiatry yesterday. He has been taking doxycycline. Has MRI ordered, but has not heard from anyone regarding scheduling of this yet.    Will need to have MRI done to assess for resolution of osteo. If resolved on imaging, can stop doxy. If no resolution, will need to continue abx and discuss with patient likelihood of amputation. He does follow with podiatry as well.   HCM: Colonoscopy vs fobt HCV  BMP 1/11 gfr 47 CBC microcytic anemia- suspect chronic disease Microalbumin 146 12/2021 A1c 10.8 12/4 LDL 78 12/2021

## 2022-03-15 NOTE — Progress Notes (Signed)
Documentation for Freestyle Libre Pro Continuous glucose monitoring ordered by Dr. Stann Mainland today Freestyle Libre Pro CGM sensor placed today. Patient was educated about wearing sensor, keeping food, activity and medication log and when to call office. Patient was educated about how to care for the sensor and not to have an MRI, CT or Diathermy while wearing the sensor. Follow up was arranged with the patient for 2 weeks per Dr. Stann Mainland.   Lot #: [id number] Serial #: [id number] Expiration Date: 07/23/2022  Debera Lat, RD 03/15/2022 3:41 PM.

## 2022-03-15 NOTE — Patient Instructions (Addendum)
????? ??? ????? ??? ????? ??? ?????? ??? ?????? ?????? ?????. ????? ??????  ??? ??????: ???? ?????? 37 ???? ?? ??????? ????????? ????? ?????? ??? ??????? ?????? 37 ???? ????? ??????. ?? ??? ?????? ???? ?????????? ???? ????? ??? ?????? ?? ??????. ??? ???? ??? ????? ???? ?? ??????????? ??? ????? ?? ??????? ?? 1.5 ??? ??? 3.0 ???. ??? ???? ??? ??? ????? ??? ?? ???????. ???? ?????? ???????? ????? ?? ?? 10 ??? ??? 25 ??? ?? ???. ??? ????? ?? ???? ???????. ??? ???? ???? ???? ???? ?????? ???????? ???? ???????? ??? ????? ?????? ?????? ???? ???? ?? ???? ????? ?? ????.  ????? ???????: ??? ?????? ??????? ???? ????? ???????.  ??? ??????: ??????? ?? ???? ??????? ????? ?????? ??? ?????? ??? ??????.       ??? ???? ??????? ???????/???? ??????? ???????:  ????? ??????? ???????: ????? ??????? ???? ??? ?????? ??? ??????  ????????????? (????????) 10 ??? ?????  ?????????? (?????????) 1.5 ???/0.5 ??  ???????????? (???????) 40 ??? ??? ????? ?????  ??????????? (????????) 1000 ??? ??? ????? ???????  ????????????? (VIBRA-TABS) 100 ??? ??? ????? ???????  ???? ?????? ???? ???????? ?? ???? (???? ACCU-CHEK) ?? ????? ????? ?????? ????? ??????  ????????? (???????) 5 ??? ??? ????? ?????  ??????????? (???????) 20 ??? ??? ????? ???????  ????????? ??????? (LEVEMIR) 100 ????/?? FlexPen ?? ?????? ?? ??? ?????? shkran lika, alsayid eumar euthman ealaa alsamah lana bitaqdim rieayatik alyawma. alyawm naqashna marad alsukari: qumna bitahwil 37 wahdat min 'ansulin alliyfimir maratayn ywmyan 'iilaa 'ansulayn lantus [place]. lam taeud alsharikat tasnae alliyfimir, lakinak satakhudh nafs alkamiyat min alnatus. nahn naemal ealaa ziadat haqnik min alttrulisitii maratan wahidatan fi al'usbue min 1.5 mulgh 'iilaa 3.0 mulaghi. 'ant takhudh hadha maratan wahidatan faqat fi al'usbuei. naqum biziadat aljardiaan alkhasi bik min 10 mujima 'iilaa 46 majam kuli yawmi. sawf narakum fi ghudun 'usbueayni. laqad qamat dunan biwade  jihaz muraqabat aljulukuz wahadha sayusaeiduna ealaa taghyir 'adwiatik liltahakum bishakl 'afdal fi nisbat alsukar fi aldam. tanzir alqawluna: qumt bitaqdim al'iihalat lifahs saratan alqawluni. fahas aleuyuni: sa'atawasal mae munasiq aljadwalat ladayna lilhusul ealaa 'iihalatik litibi aleuyuni.  laqad talabat al'adwiat altaaliatu/ghirat al'adwiat altaaliatu: 'iiqaf al'adwiat altaaliati: tawaqafat al'adwiat khilal hadha alliqa' sabab aldawa'  'iimbaghilifluzin (jardiansi) 10 milaghu 'aqras  duljalutayd (trulisiti) 1.5 majm/0.5 mal  'aturfastatin (libitur) 40 milaghu qurs 'iieadat tartib  almitafurmin (jlukufaji) 1000 majm qurs 'iieadat altartib  alduwksisiklin (VIBRA-TABS) 100 milaghu qurs 'iieadat altartib  mulhaq muraqabat nisbat aljulukuz fi aldam (dalil ACCU-CHEK) mae 'iieadat tartib majmueat 'adawat aljihaz  'amludibin (nurfaski) 5 milaghu qurs 'iieadat tartib  'uwlmisaratan (binikar) 20 milaghu qurs 'iieadat altartib  al'ansulin ditimir (LEVEMIR) 100 wahdatu/mil FlexPen tama 'iiqafuh min qibal almuzawad   ???? ?????? ??????? ???????: ??? ??? ???? ?????? ???????  ????????????? (????????) 25 ??? ????? ???: ????? ????? ?????? (?????? 25 ???) ?? ???? ???? ?????? ??? ???????. ??????: 30 ??? ????? ?????: 1  ?????????? (?????????) 3 ???/0.5 ?? ???: ???? 3 ??? ??? ????????? ??? ????? ?? ???????. ?????????: 2 ?? ????? ?????: 1  ??????? ??????? (??????) 100 ????/?? ??? ???????? ???: ???? 37 ???? ?? ????? ????? (?????) ??????. ?????????: 15 ?? ????? ?????: 2  ????????? (???????) 5 ??? ???? ???: ????? ????? ?????? (?????? 5 ???) ?? ???? ???? ??????. ??????: 90 ??? ????? ?????: 3  ???????????? (???????) 40 ???? ???: ????? ????? ?????? (?????? 40 ???) ?? ???? ???? ??????. ??????: 90 ??? ????? ?????: 3  ???? ?????? ???? ???????? ?? ???? (???? ACCU-CHEK) ?? ?????? ????? ?????? ???: ???? ????? ????? ?? ???? ?? ?????? ??? ??? ???? ?? ????? ?? ?????? ???? ?? ????. ?????????:  1  ?????? ????? ???????: 0  ????? ?????? (VIBRA-TABS) 100 ??? ??? ???: ????? ????? ?????? (?????? 100 ???) ?? ???? ???? ????? (?????) ??????. ??????: 30 ??? ????? ???????: 0  ????????? (????????) 1000 ??? ????? ???: ????? ????? ?????? (?????? 1000 ???) ?? ???? ???? ????? (?????) ?????? ?? ??????. ??????: 180 ??? ????? ?????: 3  ??????????? (???????) 20 ??? ??? ???: ????? ????? ?????? (?????? 20 ???) ?? ???? ???? ??????. ??????: 30 ??? ????? ?????: 3    ????????: ??? ????    ???? ????? ??? ?????? ?? ????? ???????. ???? ??????? ???????? ??? ????? [phone] ??? ??? ???? ?? ????? ?? ?????. ???? ??? ??????? ?? ?? ??????? ??? ?????? ?? ?????? 9 ?????? ??? 4 ?????? ???? ???? ??? ???? ??? ???? ?????? ???? ???? ???????. ??? ??? ??? ??? ????? ????? ?? ???? ????? ???????? ????? ???? ???????? ??????? ????? ???? ???? ??????? ?????? ??? ?????. ??? ??? ????? ??? ????? ??????? ???? ????? ???????? ??? ????? ???? ???? ?????? ??? ?????.  ???? ????? ?? ?? ??????. ????? ?? ??????!  ????? ?????? ????? ?? ???? ???? ??? ???? ???? ??????? aibda bitanawul al'adwiat altaaliati: 'amar mudas bihadha [place]' al'adwia  'iimbaghilifluzin (jardiansi) 25 milaghu 'aqras siji: tanawal qrsan wahdan ('iijmali 25 mujama) ean tariq alfam ywmyan qabl al'iiftari. aljureatu: 30 qurs 'iieadat almal'i: 1  duljalutayd (trulisiti) 3 mujm/0.5 mal sij: ahqan 3 mulaghun hasab altawjihat maratan wahidatan fi al'usbuei. aliastighna'a: 2 mal 'iieadat almal'i: 1  'ansulin jalarijin (lantus) 100 wahdatu/mil qalam sulustar sayaj: ahqan 38 wahdatan fi aljild maratayn (mratayni) ywmyan. aliastighna'a: 15 mal 'iieadat almal'i: 2  'amludibin (nurfaski) 5 malgh luhi siji: tanawal qrsan wahdan ('iijmalii 5 mujama) ean tariq alfam ywmyan. aljureatu: 90 qurs 'iieadat almal'i: 3  'aturfastatin (libitur) 40 mulgham siji: tanawal qrsan wahdan ('iijmali 40 mujama) ean tariq alfam ywmyan. aljureatu: 90 qurs 'iieadat almal'i: 3  mulhaq  muraqabat nisbat aljulukuz fi aldam (dalil ACCU-CHEK) mae majmueat 'adawat aljihaz siju: afhas mustawaa alsukar fi aldam fi alsabah qabl 'awal wajbat 'aw mashrub fi alyawmi, waqabl kuli wajbatin. aliastighna'a: 1 majmuea 'iieadat altaebiati: 0  duksi sayklin (VIBRA-TABS) 100 milaghi qurs siji: tanawal qrsan wahdan ('iijmalii 100 mujama) ean tariq alfam maratayn (mratayni) ywmyan. aljureatu: 30 qurs 'iieadat altaebiati: 0  mitfurmin (jlukufaj) 1000 majamu 'aqras siji: tanawal qrsan wahdan ('iijmali 1000 mujama) ean tariq alfam maratayn (mratayni) ywmyan mae alwajbati. aljureatu: 180 qurs 'iieadat almal'i: 3  'uwlmisaratan (binikar) 20 majmu qurs siji: tanawal qrsan wahdan ('iijmali 20 mujama) ean tariq alfam ywmyan. aljureatu: 30 qurs 'iieadat almal'i: 3  almutabaeatu: shahr wahid wanahn natatalae 'iilaa ruyatikum fi almarat alqadimati. yurjaa aliatisal bieiadatina ealaa alraqm [phone] 'iidha kan ladayk 'ayu 'asyilat 'aw makhawifa. 'afdal waqt lilaitisal hu min alaithnayn 'iilaa aljumeat min alsaaeat 9 sbahan hataa 4 msa'an, walakin yujad shakhs mutah ealaa madar alsaaeat tawal 'ayaam al'usbuei. 'iidha kan dhalik baed saeat aleamal 'aw eutlat nihayat al'usbuei, faitasal biraqm almustashfaa alrayiysii waitlub tabib altibi albatinii almuqim taht altalabi. 'iidha kunt bihajat 'iilaa eubuaat dawayiyatin, yurjaa 'iiblagh alsaydaliat qabl 'usbue wahid wasawf yursilun lana tlban. shukran lithiqatik bi mae rieayatika. 'atamanaa lak al'afdala! taylar Jacqulyn Liner, duktur fi altibi markaz kun hilath liltibi albatinii  ___________________________________________________________ Thank you, [name] for allowing [place] to provide your care today. Today we discussed   Diabetes: We have trasitioned your levemir insulin 37 units twice a day to lantus insulin 37 units twice per day. The company no longer makes levemir, but you will take the same amount of lantus. We are increasing your once per week  injection of trulicity  from 1.10m to 3.0 mg. You only take this once per week. We are increasing your jardiance 10 mg to 25 mg every day. We will see you in two weeks. DButch Pennyhas placed a glucose monitor and this will help u[place]ore[place] your medications to best control your blood sugar.  Colonoscopy: I placed the referral for colon cancer screening.  Eye exam: I am going to reach out to our scheduling coordinator to get your opthalmology referral placed.       I have ordered the following medication/changed the following medications:   Stop the following medications: Medications Discontinued During This Encounter  Medication Reason   empagliflozin (JARDIANCE) 10 MG TABS tablet    Dulaglutide (TRULICITY) 1.5 M0000000SOPN    atorvastatin (LIPITOR) 40 MG tablet Reorder   metFORMIN (GLUCOPHAGE) 1000 MG tablet Reorder   doxycycline (VIBRA-TABS) 100 MG tablet Reorder   Blood Glucose Monitoring Suppl (ACCU-CHEK GUIDE) w/Device KIT Reorder   amLODipine (NORVASC) 5 MG tablet Reorder   olmesartan (BENICAR) 20 MG tablet Reorder   insulin detemir (LEVEMIR) 100 UNIT/ML FlexPen Discontinued by provider     Start the following medications: Meds ordered this encounter  Medications   empagliflozin (JARDIANCE) 25 MG TABS tablet    Sig: Take 1 tablet (25 mg total) by mouth daily before breakfast.    Dispense:  30 tablet    Refill:  1   Dulaglutide (TRULICITY) 3 M0000000SOPN    Sig: Inject 3 mg as directed once a week.    Dispense:  2 mL    Refill:  1   insulin glargine (LANTUS) 100 UNIT/ML Solostar Pen    Sig: Inject 37 Units into the skin 2 (two) times daily.    Dispense:  15 mL    Refill:  2   amLODipine (NORVASC) 5 MG tablet    Sig: Take 1 tablet (5 mg total) by mouth daily.    Dispense:  90 tablet    Refill:  3   atorvastatin (LIPITOR) 40 MG tablet    Sig: Take 1 tablet (40 mg total) by mouth daily.    Dispense:  90 tablet    Refill:  3   Blood Glucose Monitoring Suppl (ACCU-CHEK GUIDE)  w/Device KIT    Sig: Check blood sugar level in the morning before first meal or drink of the day, and before each meal.    Dispense:  1 kit    Refill:  0   doxycycline (VIBRA-TABS) 100 MG tablet    Sig: Take 1 tablet (100 mg total) by mouth 2 (two) times daily.    Dispense:  30 tablet    Refill:  0   metFORMIN (GLUCOPHAGE) 1000 MG tablet    Sig: Take 1 tablet (1,000 mg total) by mouth 2 (two) times daily with a meal.    Dispense:  180 tablet    Refill:  3   olmesartan (BENICAR) 20 MG tablet    Sig: Take 1 tablet (20 mg total) by mouth daily.    Dispense:  30 tablet    Refill:  3     Follow up:  1 month      We look forward to seeing you next time. Please call our clinic at [phone] you have any questions or concerns. The best time to call is Monday-Friday from 9am-4pm, but there is someone available 24/7. If after hours or the weekend, call the main hospital number and ask for the Internal Medicine Resident On-Call. If you need medication  refills, please notify your pharmacy one week in advance and they will send [place] a request.   Thank you for trusting me with your care. Wishing you the best!   Iona Coach, MD [place]

## 2022-03-15 NOTE — Telephone Encounter (Signed)
Order changed per Dr Stann Mainland.

## 2022-03-16 ENCOUNTER — Other Ambulatory Visit (HOSPITAL_COMMUNITY): Payer: Self-pay

## 2022-03-16 DIAGNOSIS — N183 Chronic kidney disease, stage 3 unspecified: Secondary | ICD-10-CM | POA: Insufficient documentation

## 2022-03-16 NOTE — Assessment & Plan Note (Signed)
Per charting appears patient has been followed for AKI. 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 Of not microalbumin of 146 12/2021 consistent with moderate albuminuria. On olmesartan 20 and increased empaglaflozin to 78m today. Finerenone not indicated at this time. -bmp at next visit -HTN and diabetes management

## 2022-03-16 NOTE — Progress Notes (Signed)
Established Patient Office Visit  Subjective   Patient ID: [name], male    DOB: Jun 26, 1965  Age: 57 y.o. MRN: [id number]  Chief Complaint  Patient presents with   Follow-up    3 week check    [name] is a 57 y/o male with a pmh outlined below. Please see A&P for HPI information.      Review of Systems  All other systems reviewed and are negative.     Objective:     BP 130/68 (BP Location: Left Arm, Patient Position: Sitting, Cuff Size: Normal)   Pulse 91   Temp 98.2 F (36.8 C) (Oral)   Ht 6' 2"$  (1.88 m)   Wt 208 lb 6.4 oz (94.5 kg)   SpO2 100%   BMI 26.76 kg/m    Physical Exam Constitutional:      General: He is not in acute distress.    Appearance: Normal appearance. He is normal weight.  Eyes:     General: No scleral icterus.    Extraocular Movements: Extraocular movements intact.     Conjunctiva/sclera: Conjunctivae normal.     Pupils: Pupils are equal, round, and reactive to light.  Cardiovascular:     Rate and Rhythm: Normal rate.     Heart sounds: Normal heart sounds. No murmur heard.    No gallop.  Pulmonary:     Effort: Pulmonary effort is normal. No respiratory distress.     Breath sounds: Normal breath sounds. No wheezing or rales.  Musculoskeletal:     Right lower leg: Edema present.     Left lower leg: Edema present.     Comments: R foot transmetatarsal amputation with ACE bandage wrapping and post surgical boot in place.  Skin:    General: Skin is warm and dry.     Capillary Refill: Capillary refill takes less than 2 seconds.  Neurological:     Mental Status: He is alert.      No results found for any visits on 03/15/22.    The 10-year ASCVD risk score (Arnett DK, et al., 2019) is: 20.6%    Assessment & Plan:   Problem List Items Addressed This Visit       Cardiovascular and Mediastinum   Essential hypertension    BP well controlled at 130/68 in clinic. Reports adherence to current medication regimen. Denies HA,  dizziness, CP, SOB. -Amlodipine 5 -Olmesartan 20      Relevant Medications   amLODipine (NORVASC) 5 MG tablet   atorvastatin (LIPITOR) 40 MG tablet   olmesartan (BENICAR) 20 MG tablet     Endocrine   Type 2 diabetes mellitus with hyperglycemia ([place])    Most recent A1c 01/02/2023 10.8%. Patient presents today for follow up of diabetes and med titration. Per accucheck review patient is missing many days of checking his BG. Also rarely checking fasting AM level. There are some days where his glc is mainly <150, others where it is primarily 200-300. Wife says he misses his meal time insulin at times. Lowest BG was 99. Given inconsisten BG checks at home and minimal fasting AM data will not titrate insulin today. Having [name] place CGM for 2 weeks to better understand basal and meal time insulin needs. For now increasing jardiance and trulicity. Optho referral has been made. -Levemir 37u BID to lantus 37u BID -Jardiance [id number] to [id number] -Trulicity XX123456 weekly to 3.0 mg weekly -Continue lispro 5u TID with meals -continue metformin 1000 mg BID- A1c at next visit -professional CGM placed  per [name].      Relevant Medications   empagliflozin (JARDIANCE) 25 MG TABS tablet   Dulaglutide (TRULICITY) 3 0000000 SOPN   insulin glargine (LANTUS) 100 UNIT/ML Solostar Pen   atorvastatin (LIPITOR) 40 MG tablet   Blood Glucose Monitoring Suppl (ACCU-CHEK GUIDE) w/Device KIT   metFORMIN (GLUCOPHAGE) 1000 MG tablet   olmesartan (BENICAR) 20 MG tablet   Other Relevant Orders   Ambulatory referral to diabetic education     Genitourinary   CKD (chronic kidney disease) stage 3, GFR 30-59 ml/min (HCC)    Per charting appears patient has been followed for AKI. 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 Of not microalbumin of 146 12/2021 consistent with moderate albuminuria. On olmesartan 20 and increased empaglaflozin to 2m today.  Finerenone not indicated at this time. -bmp at next visit -HTN and diabetes management        Other   Healthcare maintenance    GI referral placed for colonoscopy.      History of transmetatarsal amputation of foot ([place]    Correct MRI R foot W WO contrast ordered. Refilled Doxy. F/U with podiatry 2/19.      Other Visit Diagnoses     Osteomyelitis of right foot, unspecified type ([place]    -  Primary   Relevant Medications   doxycycline (VIBRA-TABS) 100 MG tablet   Other Relevant Orders   MR FOOT RIGHT W WO CONTRAST   Uncontrolled type 2 diabetes mellitus with hyperglycemia (HCC)       Relevant Medications   empagliflozin (JARDIANCE) 25 MG TABS tablet   Dulaglutide (TRULICITY) 3 M0000000SOPN   insulin glargine (LANTUS) 100 UNIT/ML Solostar Pen   atorvastatin (LIPITOR) 40 MG tablet   Blood Glucose Monitoring Suppl (ACCU-CHEK GUIDE) w/Device KIT   metFORMIN (GLUCOPHAGE) 1000 MG tablet   olmesartan (BENICAR) 20 MG tablet   Colon cancer screening       Relevant Orders   Ambulatory referral to Gastroenterology       Return in about 2 weeks (around 03/29/2022).    TIona Coach MD

## 2022-03-16 NOTE — Assessment & Plan Note (Addendum)
MRI with osteo. Curbside to Dr. Johnnye Sima to continue suppressive Doxy. Oral antibiotics fine. Will refer to Dr. Johnnye Sima in [place]. Will get ESR/CRP at next visit, order in for future. Will continue trying to reach out to Dr. Gean Quint with [facility] to see if further surgical intervention is needed.

## 2022-03-16 NOTE — Assessment & Plan Note (Signed)
GI referral placed for colonoscopy.

## 2022-03-16 NOTE — Assessment & Plan Note (Signed)
Most recent A1c 01/02/2023 10.8%. Patient presents today for follow up of diabetes and med titration. Per accucheck review patient is missing many days of checking his BG. Also rarely checking fasting AM level. There are some days where his glc is mainly <150, others where it is primarily 200-300. Wife says he misses his meal time insulin at times. Lowest BG was 99. Given inconsisten BG checks at home and minimal fasting AM data will not titrate insulin today. Having [name] place CGM for 2 weeks to better understand basal and meal time insulin needs. For now increasing jardiance and trulicity. Optho referral has been made. -Levemir 37u BID to lantus 37u BID -Jardiance [id number] to [id number] -Trulicity XX123456 weekly to 3.0 mg weekly -Continue lispro 5u TID with meals -continue metformin 1000 mg BID- A1c at next visit -professional CGM placed per [name].

## 2022-03-16 NOTE — Assessment & Plan Note (Signed)
BP well controlled at 130/68 in clinic. Reports adherence to current medication regimen. Denies HA, dizziness, CP, SOB. -Amlodipine 5 -Olmesartan 20

## 2022-03-19 ENCOUNTER — Encounter (HOSPITAL_BASED_OUTPATIENT_CLINIC_OR_DEPARTMENT_OTHER): Payer: Medicaid Other | Admitting: General Surgery

## 2022-03-19 DIAGNOSIS — E11621 Type 2 diabetes mellitus with foot ulcer: Secondary | ICD-10-CM | POA: Diagnosis not present

## 2022-03-20 NOTE — Progress Notes (Signed)
Descanso, [name] ([id number]) [id number] Page 1 of 10 Visit Report for 03/19/2022 Chief Complaint Document Details Patient Name: Date of Service: Samuel Pigg MA R 03/19/2022 2:45 PM Medical Record Number: [id number] Patient Account Number: [id number] Date of Birth/Sex: Treating RN: 1965-11-22 (57 y.o. 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 [name] Primary Care Provider: Nani Gasser Other Clinician: Referring Provider: Treating Provider/Extender: San Morelle in Treatment: 6 Debridement Performed for Assessment: Wound #2 Right Amputation Site - Transmetatarsal Performed By: Physician Fredirick Maudlin, MD Debridement Type: Debridement Severity of Tissue Pre Debridement: Fat layer exposed Level of Consciousness (Pre-procedure): Awake and Alert Pre-procedure Verification/Time Out Yes - 15:15 Taken: Start Time: 15:17 Pain Control: Lidocaine 4% T opical Solution T Area Debrided (L x W): otal 2.3 (cm) x 1 (cm) = 2.3 (cm) Tissue and other material debrided: Non-Viable, Callus, Slough, Skin: Epidermis, Slough Level:  Skin/Epidermis Debridement Description: Selective/Open Wound Instrument: Curette Bleeding: Minimum Hemostasis Achieved: Pressure Procedural Pain: 0 Post Procedural Pain: 0 Response to Treatment: Procedure was tolerated well Level of Consciousness (Post- Awake and Alert procedure): Post Debridement Measurements of Total Wound Length: (cm) 1.2 Width: (cm) 0.5 Depth: (cm) 0.1 Volume: (cm) 0.047 Character of Wound/Ulcer Post Debridement: Improved Severity of Tissue Post Debridement: Fat layer exposed [name] ([id number].pdf Page 2 of 10 Post Procedure Diagnosis Same as Pre-procedure Notes Scribed for Dr. Celine Ahr by Baruch Gouty, RN Electronic Signature(s) Signed: 03/19/2022 5:01:55 PM By: Fredirick Maudlin MD FACS Signed: 03/19/2022 5:38:46 PM By: Baruch Gouty RN, BSN Entered By: Baruch Gouty on 03/19/2022 15:18:57 -------------------------------------------------------------------------------- Debridement Details Patient Name: Date of Service: Samuel Pigg MA R 03/19/2022 2:45 PM Medical Record Number: [id number] Patient Account Number: [id number] Date of Birth/Sex: Treating RN: 09/20/1965 (57 y.o. [name] Primary Care Provider: Nani Gasser Other Clinician: Referring Provider: Treating Provider/Extender: San Morelle in Treatment: 6 Debridement Performed for Assessment: Wound #1 Left Amputation Site - Transmetatarsal Performed By: Physician Fredirick Maudlin, MD Debridement Type: Debridement Severity of Tissue Pre Debridement: Fat layer exposed Level of Consciousness (Pre-procedure): Awake and Alert Pre-procedure Verification/Time Out Yes - 15:15 Taken: Start Time: 15:17 Pain Control: Lidocaine 4% Topical Solution T Area Debrided (L x W): otal 1 (cm) x 1 (cm) = 1 (cm) Tissue and other material debrided: Non-Viable, Callus, Skin: Epidermis Level: Skin/Epidermis Debridement Description:  Selective/Open Wound Instrument: Curette Bleeding: Minimum Hemostasis Achieved: Pressure Procedural Pain: 0 Post Procedural Pain: 0 Response to Treatment: Procedure was tolerated well Level of Consciousness (Post- Awake and Alert procedure): Post Debridement Measurements of Total Wound Length: (cm) 0.3 Width: (cm) 0.3 Depth: (cm) 0.3 Volume: (cm) 0.021 Character of Wound/Ulcer Post Debridement: Improved Severity of Tissue Post Debridement: Fat layer exposed Post Procedure Diagnosis Same as Pre-procedure Notes Scribed for Dr. Celine Ahr by Baruch Gouty, RN Electronic Signature(s) Signed: 03/19/2022 5:01:55 PM By: Fredirick Maudlin MD FACS Signed: 03/19/2022 5:38:46 PM By: Baruch Gouty  RN, BSN Entered By: Baruch Gouty on 03/19/2022 15:20:56 [name] ([id number] Page 3 of 10 -------------------------------------------------------------------------------- HPI Details Patient Name: Date of Service: Samuel Pigg MA R 03/19/2022 2:45 PM Medical Record Number: [id number] Patient Account Number: [id number] Date of Birth/Sex: Treating RN: 1965/08/02 (57 y.o. M) Primary Care Provider: Nani Gasser Other Clinician: Referring Provider: Treating Provider/Extender: San Morelle in Treatment: 6 History of Present Illness HPI Description: ADMISSION 02/02/2022 This is a 57 year old [place] man who speaks only Arabic. The visit today was conducted with the assistance of the language line interpreter; the patient declines the use of an in person interpreter. He is a poorly controlled diabetic (last hemoglobin A1c 10.8, but it has been as high as 15 in the past) with CKD stage III and hypertension. 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 He required revision of the right TMA in October. This was done in the [place] system.  It is not entirely clear how he was referred to our clinic however he has openings on the distal portion of the right foot as well as drainage coming from the plantar aspect of his left foot underneath some callus. The patient has just been applying dry gauze to both areas. 02/09/2022: The left-sided wound is quite a bit smaller today, but is covered with callus. The right sided wound has also contracted a little bit and is very clean, but there is also callus accumulation around the perimeter. Unfortunately, due to his Medicaid status, we have been unable to secure home health assistance and he has been changing the dressings on his own. He also has difficulty obtaining dressing supplies. 02/19/2022: Both wounds are smaller, particularly on the right. The left is covered with a layer of callus. No concern for infection. 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 There is light slough on the surface with some surrounding callus. The left plantar foot wound has callused over. Underneath the callus, the there is a small opening in the skin. 03/06/2022: The wounds are about the same size this week. There is callus accumulation around each, but the surfaces are cleaner. 03/19/2022: The wound on the right is quite a bit smaller this week. There is minimal callus accumulation and light slough on the surface. The wound on the left is almost completely covered in callus. Electronic Signature(s) Signed: 03/19/2022 4:17:54 PM By: Fredirick Maudlin MD FACS Entered By: Fredirick Maudlin on 03/19/2022 16:17:54 -------------------------------------------------------------------------------- Physical Exam Details Patient Name: Date of Service: Samuel Pigg MA R 03/19/2022 2:45 PM Medical Record Number: [id number] Patient Account Number: [id number] Date of Birth/Sex: Treating RN: [birth year] (57 y.o. M) Primary Care Provider: Nani Gasser Other Clinician: Referring Provider: Treating  Provider/Extender: San Morelle in Treatment: 6 Constitutional . . . . no acute distress. Respiratory Normal work of breathing on room air. Notes 03/19/2022: The wound on the right is quite a bit smaller this week. There is minimal callus accumulation and light slough on the surface. The wound on the left is almost completely covered in callus. Electronic Signature(s) Signed: 03/19/2022 4:18:22 PM By: Fredirick Maudlin MD FACS Entered By: Fredirick Maudlin on 03/19/2022 16:18:22 [name] ([id number] Page 4 of 10 -------------------------------------------------------------------------------- Physician Orders Details Patient Name: Date of Service: Samuel Pigg MA R 03/19/2022 2:45 PM Medical Record Number: [id number] Patient Account Number: [id number] Date of Birth/Sex: Treating RN: [birth year] (57 y.o. 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 - Dr. Celine Ahr +++ INTERPRETER [facility] Return A RM 1 Wednesday 2/26 @ 3:30 pm Anesthetic Wound #1 Left Amputation Site - Transmetatarsal (In clinic) Topical Lidocaine 4% applied to wound bed Bathing/ Shower/ Hygiene May shower and wash wound with soap and water. Off-Loading Wound #2 Right Amputation Site - Transmetatarsal DH Walker Boot to: - right foot Wound Treatment Wound #1 - Amputation Site - Transmetatarsal Wound  Laterality: Left Cleanser: Soap and Water 1 x Per Day/30 Days Discharge Instructions: May shower and wash wound with dial antibacterial soap and water prior to dressing change. Peri-Wound Care: Sween Lotion (Moisturizing lotion) 1 x Per Day/30 Days Discharge Instructions: Apply moisturizing lotion as directed Prim Dressing: Sorbalgon AG Dressing, 4x4 (in/in) 1 x Per Day/30 Days ary Discharge Instructions: Apply to wound bed as instructed Secondary Dressing: Optifoam Non-Adhesive Dressing, 4x4 in 1 x Per Day/30 Days Discharge Instructions: Apply over primary dressing cut to make foam donut Secondary Dressing: Woven Gauze Sponges 2x2 in 1 x Per Day/30 Days Discharge Instructions: Apply over primary dressing as directed. Secured With: Elastic Bandage 4 inch (ACE bandage) 1 x Per Day/30 Days Discharge Instructions: Secure with ACE bandage as directed. Secured With: [employer], 4.5x3.1 (in/yd) 1 x Per Day/30 Days Discharge Instructions: Secure with Kerlix as directed. Secured With: 66M Medipore H Soft Cloth Surgical T ape, 4 x 10 (in/yd) 1 x Per Day/30 Days Discharge Instructions: Secure with tape as directed. Wound #2 - Amputation Site - Transmetatarsal Wound Laterality: Right Cleanser: Soap and Water 1 x Per Day/30 Days Discharge Instructions: May shower and wash wound with dial antibacterial soap and water prior to dressing change. Peri-Wound Care: Sween Lotion (Moisturizing lotion) 1 x Per Day/30 Days Discharge Instructions: Apply moisturizing lotion as directed KIESHAWN, SUPAN ([id number]) [phone].pdf Page 5 of 10 Prim Dressing: Sorbalgon AG Dressing, 4x4 (in/in) 1 x Per Day/30 Days ary Discharge Instructions: Apply to wound bed as instructed Secondary Dressing: Optifoam Non-Adhesive Dressing, 4x4 in 1 x Per Day/30 Days Discharge Instructions: Apply over primary dressing cut to make foam donut Secondary Dressing: Woven Gauze Sponges 2x2 in 1 x Per Day/30  Days Discharge Instructions: Apply over primary dressing as directed. Secured With: Elastic Bandage 4 inch (ACE bandage) 1 x Per Day/30 Days Discharge Instructions: Secure with ACE bandage as directed. Secured With: [employer], 4.5x3.1 (in/yd) 1 x Per Day/30 Days Discharge Instructions: Secure with Kerlix as directed. Secured With: 66M Medipore H Soft Cloth Surgical T ape, 4 x 10 (in/yd) 1 x Per Day/30 Days Discharge Instructions: Secure with tape as directed. Electronic Signature(s) Signed: 03/19/2022 5:01:55 PM By: Fredirick Maudlin MD FACS Entered By: Fredirick Maudlin on 03/19/2022 16:18:41 -------------------------------------------------------------------------------- Problem List Details Patient Name: Date of Service: Samuel Pigg MA R 03/19/2022 2:45 PM Medical Record Number: [id number] Patient Account Number: [id number] Date of Birth/Sex: Treating RN: [birth year] (57 y.o. 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 02/02/2022 No Yes without evidence of necrosis L97.522 Non-pressure chronic ulcer of other part of left foot with fat layer exposed 02/02/2022 No Yes E11.621 Type 2 diabetes mellitus with foot ulcer 02/02/2022 No Yes Z89.439 Acquired absence of unspecified foot 02/02/2022 No Yes Inactive Problems Resolved Problems Electronic Signature(s) Signed: 03/19/2022 4:16:45 PM By: Fredirick Maudlin MD FACS Entered By: Fredirick Maudlin on 03/19/2022 16:16:45 Alene Mires, [name] ([id number]) [id number] Page 6 of 10 -------------------------------------------------------------------------------- Progress Note Details Patient Name: Date of Service: Samuel Pigg MA R  03/19/2022 2:45 PM Medical Record Number: [id number] Patient Account Number: [id number] Date of Birth/Sex: Treating RN: 1965/09/01 (58 y.o. 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 The visit today was conducted with the assistance of the language line interpreter; the patient declines the use of an in person interpreter. He is a poorly controlled diabetic (last hemoglobin A1c 10.8, but it has been as high as 15 in the past) with CKD stage III and hypertension. 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 He required revision of the right TMA in October. This was done in the [facility] system. It is not entirely clear how he was referred to our clinic however he has openings on the distal portion of the right foot as well as drainage coming from the plantar aspect of his left foot underneath some callus. The patient has just been applying dry gauze to both areas. 02/09/2022: The left-sided wound is quite a bit smaller today, but is covered with callus. The right sided wound has also contracted a little bit and is very clean, but there is also callus accumulation around the perimeter. Unfortunately, due to his Medicaid status, we have been unable to secure home health assistance and he has been changing the dressings on his own. He also has difficulty obtaining dressing supplies. 02/19/2022: Both wounds are smaller,  particularly on the right. The left is covered with a layer of callus. No concern for infection. 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 There is light slough on the surface with some surrounding callus. The left plantar foot wound has callused over. Underneath the callus, the there is a small opening in the skin. 03/06/2022: The wounds are about the same size this week. There is callus accumulation around each, but the surfaces are cleaner. 03/19/2022: The wound on the right is quite a bit smaller this week. There is minimal callus accumulation and light slough on the surface. The wound on the left is almost completely covered in callus. Patient History Family History Unknown History, Cancer, No family history of Diabetes, Heart Disease, Hereditary Spherocytosis, Hypertension, Kidney Disease, Lung Disease, Seizures, Stroke, Thyroid Problems, Tuberculosis. Social History Never smoker, Marital Status - Married, Alcohol Use - Never, Drug Use - No History, Caffeine Use - Rarely. Medical History Hematologic/Lymphatic Patient has history of Lymphedema Cardiovascular Patient has history of Hypertension Endocrine Patient has history of Type II Diabetes Neurologic Patient has history of Neuropathy Medical A Surgical History Notes  nd Genitourinary AKI Objective Constitutional no acute distress. [place], [name] ([id number]) [id number] Page 7 of 10 Vitals Time Taken: 3:02 PM, Height: 72 in, Weight: 214 lbs, BMI: 29, Temperature: 98.6 F, Pulse: 87 bpm, Respiratory Rate: 18 breaths/min, Blood Pressure: 135/79 mmHg, Capillary Blood Glucose: 124 mg/dl. General Notes: glucose per pt report this am Respiratory Normal work of breathing on room air. General Notes: 03/19/2022: The wound on the right is quite a bit smaller this week. There is minimal callus accumulation and light slough on the surface. The wound on the left is almost  completely covered in callus. Integumentary (Hair, Skin) Wound #1 status is Open. Original cause of wound was Surgical Injury. The date acquired was: 02/01/2022. The wound has been in treatment 6 weeks. The wound is located on the Left Amputation Site - Transmetatarsal. The wound measures 0.2cm length x 0.2cm width x 0.5cm depth; 0.031cm^2 area and 0.016cm^3 volume. There is Fat Layer (Subcutaneous Tissue) exposed. There is no tunneling noted, however, there is undermining starting at 12:00 and ending at 12:00 with a maximum distance of 0.3cm. There is a small amount of serosanguineous drainage noted. The wound margin is well defined and not attached to the wound base. There is no granulation within the wound bed. There is no necrotic tissue within the wound bed. The periwound skin appearance had no abnormalities noted for color. The periwound skin appearance exhibited: Callus, Dry/Scaly. The periwound skin appearance did not exhibit: Maceration. Periwound temperature was noted as No Abnormality. The periwound has tenderness on palpation. Wound #2 status is Open. Original cause of wound was Surgical Injury. The date acquired was: 10/30/2021. The wound has been in treatment 6 weeks. The wound is located on the Right Amputation Site - Transmetatarsal. The wound measures 1.2cm length x 0.5cm width x 0.2cm depth; 0.471cm^2 area and 0.094cm^3 volume. There is Fat Layer (Subcutaneous Tissue) exposed. There is no tunneling or undermining noted. There is a medium amount of serosanguineous drainage noted. The wound margin is thickened. There is large (67-100%) red granulation within the wound bed. There is a small (1-33%) amount of necrotic tissue within the wound bed including Adherent Slough. The periwound skin appearance had no abnormalities noted for moisture. The periwound skin appearance had no abnormalities noted for color. The periwound skin appearance exhibited: Callus. Periwound temperature was noted as  No Abnormality. 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 There was a Selective/Open Wound Skin/Epidermis Debridement with a total area of 1 sq cm performed by Fredirick Maudlin, MD. With the following instrument(s): Curette to remove Non-Viable tissue/material. Material removed includes Callus and Skin: Epidermis and after achieving pain control using Lidocaine 4% Topical Solution. No specimens were taken. A time out was conducted at 15:15, prior to the start of the procedure. A Minimum amount of bleeding was controlled with Pressure. The procedure was tolerated well with a pain level of 0 throughout and a pain level of 0 following the procedure. Post Debridement Measurements: 0.3cm length x 0.3cm width x 0.3cm depth; 0.021cm^3 volume. Character of Wound/Ulcer Post Debridement is improved. Severity of Tissue Post Debridement  is: Fat layer exposed. Post procedure Diagnosis Wound #1: Same as Pre-Procedure General Notes: Scribed for Dr. Celine Ahr by Baruch Gouty, RN. Wound #2 Pre-procedure diagnosis of Wound #2 is a Diabetic Wound/Ulcer of the Lower Extremity located on the Right Amputation Site - Transmetatarsal .Severity of Tissue Pre Debridement is: Fat layer exposed. There was a Selective/Open Wound Skin/Epidermis Debridement with a total area of 2.3 sq cm performed by Fredirick Maudlin, MD. With the following instrument(s): Curette to remove Non-Viable tissue/material. Material removed includes Callus, Slough, and  Skin: Epidermis after achieving pain control using Lidocaine 4% Topical Solution. No specimens were taken. A time out was conducted at 15:15, prior to the start of the procedure. A Minimum amount of bleeding was controlled with Pressure. The procedure was tolerated well with a pain level of 0 throughout and a pain level of 0 following the procedure. Post Debridement Measurements: 1.2cm length x 0.5cm width x 0.1cm depth; 0.047cm^3 volume. Character of Wound/Ulcer Post Debridement is improved. Severity of Tissue Post Debridement is: Fat layer exposed. Post procedure Diagnosis Wound #2: Same as Pre-Procedure General Notes: Scribed for Dr. Celine Ahr by Baruch Gouty, RN. Plan Follow-up Appointments: Return Appointment in 1 week. - Dr. Celine Ahr +++ INTERPRETER [facility] RM 1 Wednesday 2/26 @ 3:30 pm Anesthetic: Wound #1 Left Amputation Site - Transmetatarsal: (In clinic) Topical Lidocaine 4% applied to wound bed Bathing/ Shower/ Hygiene: May shower and wash wound with soap and water. Off-Loading: Wound #2 Right Amputation Site - Transmetatarsal: DH Walker Boot to: - right foot WOUND #1: - Amputation Site - Transmetatarsal Wound Laterality: Left [place], [name] ([id number]) [phone].pdf Page 8 of 10 Cleanser: Soap and Water 1 x Per Day/30 Days Discharge Instructions: May shower and wash wound with dial antibacterial soap and water prior to dressing change. Peri-Wound Care: Sween Lotion (Moisturizing lotion) 1 x Per Day/30 Days Discharge Instructions: Apply moisturizing lotion as directed Prim Dressing: Sorbalgon AG Dressing, 4x4 (in/in) 1 x Per Day/30 Days ary Discharge Instructions: Apply to wound bed as instructed Secondary Dressing: Optifoam Non-Adhesive Dressing, 4x4 in 1 x Per Day/30 Days Discharge Instructions: Apply over primary dressing cut to make foam donut Secondary Dressing: Woven Gauze Sponges 2x2 in 1 x Per Day/30 Days Discharge Instructions: Apply over  primary dressing as directed. Secured With: Elastic Bandage 4 inch (ACE bandage) 1 x Per Day/30 Days Discharge Instructions: Secure with ACE bandage as directed. Secured With: [employer], 4.5x3.1 (in/yd) 1 x Per Day/30 Days Discharge Instructions: Secure with Kerlix as directed. Secured With: 29M Medipore H Soft Cloth Surgical T ape, 4 x 10 (in/yd) 1 x Per Day/30 Days Discharge Instructions: Secure with tape as directed. WOUND #2: - Amputation Site - Transmetatarsal Wound Laterality: Right Cleanser: Soap and Water 1 x Per Day/30 Days Discharge Instructions: May shower and wash wound with dial antibacterial soap and water prior to dressing change. Peri-Wound Care: Sween Lotion (Moisturizing lotion) 1 x Per Day/30 Days Discharge Instructions: Apply moisturizing lotion as directed Prim Dressing: Sorbalgon AG Dressing, 4x4 (in/in) 1 x Per Day/30 Days ary Discharge Instructions: Apply to wound bed as instructed Secondary Dressing: Optifoam Non-Adhesive Dressing, 4x4 in 1 x Per Day/30 Days Discharge Instructions: Apply over primary dressing cut to make foam donut Secondary Dressing: Woven Gauze Sponges 2x2 in 1 x Per Day/30 Days Discharge Instructions: Apply over primary dressing as directed. Secured With: [employer] Bandage 4 inch (ACE bandage) 1 x Per Day/30 Days Discharge Instructions: Secure with ACE bandage as directed. Secured With: [employer], 4.5x3.1 (in/yd)  1 x Per Day/30 Days Discharge Instructions: Secure with Kerlix as directed. Secured With: 28M Medipore H Soft Cloth Surgical T ape, 4 x 10 (in/yd) 1 x Per Day/30 Days Discharge Instructions: Secure with tape as directed. 03/19/2022: The wound on the right is quite a bit smaller this week. There is minimal callus accumulation and light slough on the surface. The wound on the left is almost completely covered in callus. I used a curette to debride callus and slough from the wound on the right. I debrided callus and skin  from the wound on the left, opening it up to enable better wound care at home. We will continue silver alginate to both sites. Follow-up in 1 week. Electronic Signature(s) Signed: 03/19/2022 4:20:00 PM By: Fredirick Maudlin MD FACS Entered By: Fredirick Maudlin on 03/19/2022 16:20:00 -------------------------------------------------------------------------------- HxROS Details Patient Name: Date of Service: Samuel Pigg MA R 03/19/2022 2:45 PM Medical Record Number: [id number] Patient Account Number: [id number] Date of Birth/Sex: Treating RN: [birth year] (57 y.o. M) Primary Care Provider: Nani Gasser Other Clinician: Referring Provider: Treating Provider/Extender: [place] Morelle in Treatment: 6 Hematologic/Lymphatic Medical History: Positive for: Lymphedema Cardiovascular Medical History: Positive for: Hypertension Endocrine Medical History: Positive for: Type II Diabetes Treated with: Insulin, Oral agents JOHNTRELL, KURT ([id number]) [id number] Page 9 of 10 Genitourinary Medical History: Past Medical History Notes: AKI Neurologic Medical History: Positive for: Neuropathy Immunizations Pneumococcal Vaccine: Received Pneumococcal Vaccination: Yes Received Pneumococcal Vaccination On or After 60th Birthday: Yes Implantable Devices No devices added Family and Social History Unknown History: Yes; Cancer: Yes; Diabetes: No; Heart Disease: No; Hereditary Spherocytosis: No; Hypertension: No; Kidney Disease: No; Lung Disease: No; Seizures: No; Stroke: No; Thyroid Problems: No; Tuberculosis: No; Never smoker; Marital Status - Married; Alcohol Use: Never; Drug Use: No History; Caffeine Use: Rarely; Financial Concerns: No; Food, Clothing or Shelter Needs: No; Support System Lacking: No; Transportation Concerns: No Electronic Signature(s) Signed: 03/19/2022 5:01:55 PM By: Fredirick Maudlin MD FACS Entered By: Fredirick Maudlin on  03/19/2022 16:18:01 -------------------------------------------------------------------------------- SuperBill Details Patient Name: Date of Service: Samuel Pigg MA R 03/19/2022 Medical Record Number: [id number] Patient Account Number: [id number] Date of Birth/Sex: Treating RN: Feb 17, 1965 (57 y.o. 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 E11.621 Type 2 diabetes mellitus with foot ulcer Z89.439 Acquired absence of unspecified foot Facility Procedures : CPT4 Code: [id number] Description: [id number] - DEBRIDE WOUND 1ST 20 SQ CM OR < ICD-10 Diagnosis Description 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 wit L97.522 Non-pressure chronic ulcer of other part of left foot with fat layer  exposed Modifier: hout evidence of n Quantity: 1 ecrosis Physician Procedures : CPT4 Code Description Modifier [id number] - WC PHYS LEVEL 3 - EST PT 25 ICD-10 Diagnosis Description 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 without evidence of ne L97.522 Non-pressure chronic ulcer of  other part of left foot with fat layer exposed E11.621 Type 2 diabetes mellitus with foot ulcer Z89.439 Acquired absence of unspecified foot Sakuma, Grainger ([id number] - WC PHYS DEBR WO ANESTH 20 SQ CM  ICD-10 Diagnosis Description L97.515 Non-pressure chronic ulcer of other part of right  foot with muscle involvement without evidence of necro L97.522 Non-pressure chronic ulcer of other part of left foot with fat layer exposed Quantity:  1 crosis f Page 10 of 10 1 sis Electronic Signature(s) Signed: 03/19/2022 4:20:23 PM By: Fredirick Maudlin MD FACS Entered By: Fredirick Maudlin on 03/19/2022 16:20:23

## 2022-03-20 NOTE — Progress Notes (Signed)
Kohler, [name] Maw ([id number]) [id number] Page 1 of 10 Visit Report for 03/19/2022 Arrival Information Details Patient Name: Date of Service: Samuel Pigg MA R 03/19/2022 2:45 PM Medical Record Number: [id number] Patient Account Number: [id number] Date of Birth/Sex: Treating RN: [birth year] (57 y.o. [name], Vaughan Basta Primary Care Kerilyn Cortner: Nani Gasser Other Clinician: Referring Toleen Lachapelle: Treating Merrit Friesen/Extender: [place] in Treatment: 6 Visit Information History Since Last Visit Added or deleted any medications: No Patient Arrived: Ambulatory Any new allergies or adverse reactions: No Arrival Time: 14:54 Had a fall or experienced change in No Accompanied By: son activities of daily living that may affect Transfer Assistance: None risk of falls: Patient Identification Verified: Yes Signs or symptoms of abuse/neglect since last No Secondary Verification Process Completed: Yes visito Patient Requires Transmission-Based Precautions: No Hospitalized since last visit: No Implantable device outside of the clinic No excluding cellular tissue based products placed in the center since last visit: Has Dressing in Place as Prescribed: Yes Has Compression in Place as Prescribed: No Has Footwear/Offloading in Place as Yes Prescribed: Right: Removable Cast Walker/Walking Boot Pain Present Now: No Electronic Signature(s) Signed: 03/19/2022 5:38:46 PM By: Baruch Gouty RN, BSN Entered By: Baruch Gouty on 03/19/2022 15:01:14 -------------------------------------------------------------------------------- Encounter Discharge Information Details Patient Name: Date of Service: Samuel Pigg MA R 03/19/2022 2:45 PM Medical Record Number: [id number] Patient Account Number: [id number] Date of Birth/Sex: Treating RN: [birth year] (57 y.o. [name] Primary Care Fleur Audino: Nani Gasser Other Clinician: Referring  Bradford Cazier: Treating Ane Conerly/Extender: [place] in Treatment: 6 Encounter Discharge Information Items Post Procedure Vitals Discharge Condition: Stable Temperature (F): 98.6 Ambulatory Status: Ambulatory Pulse (bpm): 87 Discharge Destination: Home Respiratory Rate (breaths/min): 18 Transportation: Private Auto Blood Pressure (mmHg): 135/79 Accompanied By: son Schedule Follow-up Appointment: Yes Clinical Summary of Care: Patient Declined Electronic Signature(s) Signed: 03/19/2022 5:38:46 PM By: Baruch Gouty RN, BSN Entered By: Baruch Gouty on 03/19/2022 15:36:39 [name], [name] Maw ([id number]) [id number] Page 2 of 10 -------------------------------------------------------------------------------- Lower Extremity Assessment Details Patient Name: Date of Service: Samuel Pigg MA R 03/19/2022 2:45 PM Medical Record Number: [id number] Patient Account Number: [id number] Date of Birth/Sex: Treating RN: [birth year] (57 y.o. [name] Primary Care Torrie Namba: Nani Gasser Other Clinician: Referring Zohal Reny: Treating [name]/Extender: [place] in Treatment: 6 Edema Assessment Assessed: Shirlyn Goltz: No] [Right: No] Edema: [Left: Yes] [Right: No] Calf Left: Right: Point of Measurement: From Medial Instep 36 cm 35 cm Ankle Left: Right: Point of Measurement: From Medial Instep 24 cm 23.5 cm Vascular Assessment Pulses: Dorsalis Pedis Palpable: [Left:Yes] [Right:Yes] Electronic Signature(s) Signed: 03/19/2022 5:38:46 PM By: Baruch Gouty RN, BSN Entered By: Baruch Gouty on 03/19/2022 15:06:33 -------------------------------------------------------------------------------- Multi Wound Chart Details Patient Name: Date of Service: Samuel Pigg MA R 03/19/2022 2:45 PM Medical Record Number: [id number] Patient Account Number: [id number] Date of Birth/Sex: Treating RN: 05-20-1965 (57 y.o.  M) Primary Care Sadiya Durand: Nani Gasser Other Clinician: Referring Nou Chard: Treating Kawika Bischoff/Extender: [place] in Treatment: 6 Vital Signs Height(in): 72 Capillary Blood Glucose(mg/dl): 124 Weight(lbs): 214 Pulse(bpm): 87 Body Mass Index(BMI): 29 Blood Pressure(mmHg): 135/79 Temperature(F): 98.6 Respiratory Rate(breaths/min): 18 [1:Photos:] [N/A:N/A [id number] Page 3 of 10] Left Amputation Site - Transmetatarsal Right Amputation Site - N/A Wound Location: Transmetatarsal Surgical Injury Surgical Injury N/A Wounding Event: Diabetic Wound/Ulcer of the Lower Diabetic Wound/Ulcer of the Lower N/A Primary Etiology: Extremity Extremity Lymphedema, Hypertension, Type II Lymphedema, Hypertension, Type II N/A Comorbid History: Diabetes, Neuropathy Diabetes, Neuropathy 02/01/2022  10/30/2021 N/A Date Acquired: 6 6 N/A Weeks of Treatment: Open Open N/A Wound Status: No No N/A Wound Recurrence: 0.2x0.2x0.5 1.2x0.5x0.2 N/A Measurements L x W x D (cm) 0.031 0.471 N/A A (cm) : rea 0.016 0.094 N/A Volume (cm) : 98.00% 96.20% N/A % Reduction in A rea: 89.80% 92.40% N/A % Reduction in Volume: 12 Starting Position 1 (o'clock): 12 Ending Position 1 (o'clock): 0.3 Maximum Distance 1 (cm): Yes No N/A Undermining: Grade 1 Grade 1 N/A Classification: Small Medium N/A Exudate A mount: Serosanguineous Serosanguineous N/A Exudate Type: red, brown red, brown N/A Exudate Color: Well defined, not attached Thickened N/A Wound Margin: None Present (0%) Large (67-100%) N/A Granulation A mount: N/A Red N/A Granulation Quality: None Present (0%) Small (1-33%) N/A Necrotic A mount: Fat Layer (Subcutaneous Tissue): Yes Fat Layer (Subcutaneous Tissue): Yes N/A Exposed Structures: Fascia: No Fascia: No Tendon: No Tendon: No Muscle: No Muscle: No Joint: No Joint: No Bone: No Bone: No Small (1-33%) Small  (1-33%) N/A Epithelialization: Debridement - Selective/Open Wound Debridement - Selective/Open Wound N/A Debridement: Pre-procedure Verification/Time Out 15:15 15:15 N/A Taken: Lidocaine 4% Topical Solution Lidocaine 4% T opical Solution N/A Pain Control: Callus Callus, Slough N/A Tissue Debrided: Skin/Epidermis Skin/Epidermis N/A Level: 1 2.3 N/A Debridement A (sq cm): rea Curette Curette N/A Instrument: Minimum Minimum N/A Bleeding: Pressure Pressure N/A Hemostasis A chieved: 0 0 N/A Procedural Pain: 0 0 N/A Post Procedural Pain: Procedure was tolerated well Procedure was tolerated well N/A Debridement Treatment Response: 0.3x0.3x0.3 1.2x0.5x0.1 N/A Post Debridement Measurements L x W x D (cm) 0.021 0.047 N/A Post Debridement Volume: (cm) Callus: Yes Callus: Yes N/A Periwound Skin Texture: Dry/Scaly: Yes Maceration: Yes N/A Periwound Skin Moisture: Maceration: No Dry/Scaly: No No Abnormalities Noted No Abnormalities Noted N/A Periwound Skin Color: No Abnormality No Abnormality N/A Temperature: Yes N/A N/A Tenderness on Palpation: Debridement Debridement N/A Procedures Performed: Treatment Notes Wound #1 (Amputation Site - Transmetatarsal) Wound Laterality: Left Cleanser Soap and Water Discharge Instruction: May shower and wash wound with dial antibacterial soap and water prior to dressing change. Peri-Wound Care Sween Lotion (Moisturizing lotion) Discharge Instruction: Apply moisturizing lotion as directed Topical Primary Dressing Sorbalgon AG Dressing, 4x4 (in/in) Discharge Instruction: Apply to wound bed as instructed Secondary Dressing Optifoam Non-Adhesive Dressing, 4x4 in Discharge Instruction: Apply over primary dressing cut to make foam donut Hernandez, Samuel ([id number]) [phone].pdf Page 4 of 10 Woven Gauze Sponges 2x2 in Discharge Instruction: Apply over primary dressing as directed. Secured With Elastic Bandage 4 inch  (ACE bandage) Discharge Instruction: Secure with ACE bandage as directed. Kerlix Roll Sterile, 4.5x3.1 (in/yd) Discharge Instruction: Secure with Kerlix as directed. 57M Medipore H Soft Cloth Surgical T ape, 4 x 10 (in/yd) Discharge Instruction: Secure with tape as directed. Compression Wrap Compression Stockings Add-Ons Wound #2 (Amputation Site - Transmetatarsal) Wound Laterality: Right Cleanser Soap and Water Discharge Instruction: May shower and wash wound with dial antibacterial soap and water prior to dressing change. Peri-Wound Care Sween Lotion (Moisturizing lotion) Discharge Instruction: Apply moisturizing lotion as directed Topical Primary Dressing Sorbalgon AG Dressing, 4x4 (in/in) Discharge Instruction: Apply to wound bed as instructed Secondary Dressing Optifoam Non-Adhesive Dressing, 4x4 in Discharge Instruction: Apply over primary dressing cut to make foam donut Woven Gauze Sponges 2x2 in Discharge Instruction: Apply over primary dressing as directed. Secured With Elastic Bandage 4 inch (ACE bandage) Discharge Instruction: Secure with ACE bandage as directed. Kerlix Roll Sterile, 4.5x3.1 (in/yd) Discharge Instruction: Secure with Kerlix as directed. 57M Medipore H Soft Cloth Surgical T  ape, 4 x 10 (in/yd) Discharge Instruction: Secure with tape as directed. Compression Wrap Compression Stockings Add-Ons Electronic Signature(s) Signed: 03/19/2022 4:16:59 PM By: Fredirick Maudlin MD FACS Entered By: Fredirick Maudlin on 03/19/2022 16:16:58 -------------------------------------------------------------------------------- Multi-Disciplinary Care Plan Details Patient Name: Date of Service: Samuel Pigg MA R 03/19/2022 2:45 PM Medical Record Number: [id number] Patient Account Number: [id number] Date of Birth/Sex: Treating RN: [birth year] (57 y.o. [name] Primary Care Dannia Snook: Nani Gasser Other Clinician: Referring Danniela Mcbrearty: Treating [name]/Extender:  [place] in Treatment: [place], [place] ([id number]) [id number] Page 5 of 10 Multidisciplinary Care Plan reviewed with physician Active Inactive Nutrition Nursing Diagnoses: Impaired glucose control: actual or potential Goals: Patient/caregiver verbalizes understanding of need to maintain therapeutic glucose control per primary care physician Date Initiated: 02/02/2022 Target Resolution Date: 03/29/2022 Goal Status: Active Interventions: Assess patient nutrition upon admission and as needed per policy Provide education on elevated blood sugars and impact on wound healing Treatment Activities: Dietary management education, guidance and counseling : 02/02/2022 Notes: Wound/Skin Impairment Nursing Diagnoses: Impaired tissue integrity Goals: Patient/caregiver will verbalize understanding of skin care regimen Date Initiated: 03/06/2022 Target Resolution Date: 03/29/2022 Goal Status: Active Ulcer/skin breakdown will have a volume reduction of 30% by week 4 Date Initiated: 02/02/2022 Target Resolution Date: 03/29/2022 Goal Status: Active Interventions: Assess ulceration(s) every visit Provide education on ulcer and skin care Treatment Activities: Patient referred to home care : 02/02/2022 Skin care regimen initiated : 02/02/2022 Notes: Electronic Signature(s) Signed: 03/19/2022 5:38:46 PM By: Baruch Gouty RN, BSN Entered By: Baruch Gouty on 03/19/2022 15:13:46 -------------------------------------------------------------------------------- Pain Assessment Details Patient Name: Date of Service: Samuel Pigg MA R 03/19/2022 2:45 PM Medical Record Number: [id number] Patient Account Number: [id number] Date of Birth/Sex: Treating RN: 17-Sep-1965 (57 y.o. [name] Primary Care Kania Regnier: Nani Gasser Other Clinician: Referring Apryll Hinkle: Treating [name]/Extender: [place] in Treatment:  6 Active Problems Location of Pain Severity and Description of Pain Patient Has Paino No Site Locations Rate the pain. [name], [name] Maw ([id number]) [id number] Page 6 of 10 Rate the pain. Current Pain Level: 0 Pain Management and Medication Current Pain Management: Electronic Signature(s) Signed: 03/19/2022 5:38:46 PM By: Baruch Gouty RN, BSN Entered By: Baruch Gouty on 03/19/2022 15:04:07 -------------------------------------------------------------------------------- Patient/Caregiver Education Details Patient Name: Date of Service: Samuel Pigg MA R 2/19/2024andnbsp2:45 PM Medical Record Number: [id number] Patient Account Number: [id number] Date of Birth/Gender: Treating RN: January 20, 1966 (57 y.o. [name] Primary Care Physician: Nani Gasser Other Clinician: Referring Physician: Treating Physician/Extender: [place] in Treatment: 6 Education Assessment Education Provided To: Patient Education Topics Provided Offloading: Methods: Explain/Verbal Responses: Reinforcements needed, State content correctly Wound/Skin Impairment: Methods: Explain/Verbal Responses: Reinforcements needed, State content correctly Electronic Signature(s) Signed: 03/19/2022 5:38:46 PM By: Baruch Gouty RN, BSN Entered By: Baruch Gouty on 03/19/2022 15:14:25 -------------------------------------------------------------------------------- Wound Assessment Details Patient Name: Date of Service: Samuel Pigg MA R 03/19/2022 2:45 PM [name] ([id number].pdf Page 7 of 10 Medical Record Number: [id number] Patient Account Number: [id number] Date of Birth/Sex: Treating RN: [birth year] (57 y.o. [name] Primary Care Artur Winningham: Nani Gasser Other Clinician: Referring [name]: Treating [name]/Extender: [place] in Treatment: 6 Wound Status Wound Number:  1 Primary Etiology: Diabetic Wound/Ulcer of the Lower Extremity Wound Location: Left Amputation Site - Transmetatarsal Wound Status: Open Wounding Event: Surgical Injury Comorbid Lymphedema, Hypertension, Type II Diabetes, Neuropathy History: Date Acquired: 02/01/2022 Weeks Of Treatment: 6 Clustered Wound: No Photos Wound Measurements  Length: (cm) 0.2 Width: (cm) 0.2 Depth: (cm) 0.5 Area: (cm) 0.031 Volume: (cm) 0.016 % Reduction in Area: 98% % Reduction in Volume: 89.8% Epithelialization: Small (1-33%) Tunneling: No Undermining: Yes Starting Position (o'clock): 12 Ending Position (o'clock): 12 Maximum Distance: (cm) 0.3 Wound Description Classification: Grade 1 Wound Margin: Well defined, not attached Exudate Amount: Small Exudate Type: Serosanguineous Exudate Color: red, brown Foul Odor After Cleansing: No Slough/Fibrino No Wound Bed Granulation Amount: None Present (0%) Exposed Structure Necrotic Amount: None Present (0%) Fascia Exposed: No Fat Layer (Subcutaneous Tissue) Exposed: Yes Tendon Exposed: No Muscle Exposed: No Joint Exposed: No Bone Exposed: No Periwound Skin Texture Texture Color No Abnormalities Noted: No No Abnormalities Noted: Yes Callus: Yes Temperature / Pain Temperature: No Abnormality Moisture No Abnormalities Noted: No Tenderness on Palpation: Yes Dry / Scaly: Yes Maceration: No Treatment Notes Wound #1 (Amputation Site - Transmetatarsal) Wound Laterality: Left Cleanser Soap and Water Discharge Instruction: May shower and wash wound with dial antibacterial soap and water prior to dressing change. Peri-Wound Care [place], [place] ([id number]) [id number] Page 8 of 10 Sween Lotion (Moisturizing lotion) Discharge Instruction: Apply moisturizing lotion as directed Topical Primary Dressing Sorbalgon AG Dressing, 4x4 (in/in) Discharge Instruction: Apply to wound bed as instructed Secondary Dressing Optifoam  Non-Adhesive Dressing, 4x4 in Discharge Instruction: Apply over primary dressing cut to make foam donut Woven Gauze Sponges 2x2 in Discharge Instruction: Apply over primary dressing as directed. Secured With Elastic Bandage 4 inch (ACE bandage) Discharge Instruction: Secure with ACE bandage as directed. Kerlix Roll Sterile, 4.5x3.1 (in/yd) Discharge Instruction: Secure with Kerlix as directed. 42M Medipore H Soft Cloth Surgical T ape, 4 x 10 (in/yd) Discharge Instruction: Secure with tape as directed. Compression Wrap Compression Stockings Add-Ons Electronic Signature(s) Signed: 03/19/2022 5:38:46 PM By: Baruch Gouty RN, BSN Entered By: Baruch Gouty on 03/19/2022 15:11:22 -------------------------------------------------------------------------------- Wound Assessment Details Patient Name: Date of Service: Samuel Pigg MA R 03/19/2022 2:45 PM Medical Record Number: [id number] Patient Account Number: [id number] Date of Birth/Sex: Treating RN: 04/11/1965 (57 y.o. [name] Primary Care [name]: Nani Gasser Other Clinician: Referring [name]: Treating [name]/Extender: [place] in Treatment: 6 Wound Status Wound Number: 2 Primary Etiology: Diabetic Wound/Ulcer of the Lower Extremity Wound Location: Right Amputation Site - Transmetatarsal Wound Status: Open Wounding Event: Surgical Injury Comorbid Lymphedema, Hypertension, Type II Diabetes, Neuropathy History: Date Acquired: 10/30/2021 Weeks Of Treatment: 6 Clustered Wound: No Photos Wound Measurements Length: (cm) 1.2 Hernandez, Samuel ([id number]) Width: (cm) 0.5 Depth: (cm) 0.2 Area: (cm) 0.471 Volume: (cm) 0.094 % Reduction in Area: 96.2% [id number] Page 9 of 10 % Reduction in Volume: 92.4% Epithelialization: Small (1-33%) Tunneling: No Undermining: No Wound Description Classification: Grade 1 Wound Margin: Thickened Exudate Amount:  Medium Exudate Type: Serosanguineous Exudate Color: red, brown Foul Odor After Cleansing: No Slough/Fibrino Yes Wound Bed Granulation Amount: Large (67-100%) Exposed Structure Granulation Quality: Red Fascia Exposed: No Necrotic Amount: Small (1-33%) Fat Layer (Subcutaneous Tissue) Exposed: Yes Necrotic Quality: Adherent Slough Tendon Exposed: No Muscle Exposed: No Joint Exposed: No Bone Exposed: No Periwound Skin Texture Texture Color No Abnormalities Noted: No No Abnormalities Noted: Yes Callus: Yes Temperature / Pain Temperature: No Abnormality Moisture No Abnormalities Noted: Yes Treatment Notes Wound #2 (Amputation Site - Transmetatarsal) Wound Laterality: Right Cleanser Soap and Water Discharge Instruction: May shower and wash wound with dial antibacterial soap and water prior to dressing change. Peri-Wound Care Sween Lotion (Moisturizing lotion) Discharge Instruction: Apply moisturizing lotion as directed Topical Primary [place]  Dressing, 4x4 (in/in) Discharge Instruction: Apply to wound bed as instructed Secondary Dressing Optifoam Non-Adhesive Dressing, 4x4 in Discharge Instruction: Apply over primary dressing cut to make foam donut Woven Gauze Sponges 2x2 in Discharge Instruction: Apply over primary dressing as directed. Secured With Elastic Bandage 4 inch (ACE bandage) Discharge Instruction: Secure with ACE bandage as directed. Kerlix Roll Sterile, 4.5x3.1 (in/yd) Discharge Instruction: Secure with Kerlix as directed. 77M Medipore H Soft Cloth Surgical T ape, 4 x 10 (in/yd) Discharge Instruction: Secure with tape as directed. Compression Wrap Compression Stockings Add-Ons Electronic Signature(s) Signed: 03/19/2022 5:38:46 PM By: Baruch Gouty RN, BSN Entered By: Baruch Gouty on 03/19/2022 15:12:02 [name] ([id number] Page 10 of  10 -------------------------------------------------------------------------------- Vitals Details Patient Name: Date of Service: Samuel Pigg MA R 03/19/2022 2:45 PM Medical Record Number: [id number] Patient Account Number: [id number] Date of Birth/Sex: Treating RN: [birth year] (57 y.o. [name] Primary Care [name]: Nani Gasser Other Clinician: Referring [name]: Treating [name]/Extender: [place] in Treatment: 6 Vital Signs Time Taken: 15:02 Temperature (F): 98.6 Height (in): 72 Pulse (bpm): 87 Weight (lbs): 214 Respiratory Rate (breaths/min): 18 Body Mass Index (BMI): 29 Blood Pressure (mmHg): 135/79 Capillary Blood Glucose (mg/dl): 124 Reference Range: 80 - 120 mg / dl Notes glucose per pt report this am Electronic Signature(s) Signed: 03/19/2022 5:38:46 PM By: Baruch Gouty RN, BSN Entered By: Baruch Gouty on 03/19/2022 15:04:00

## 2022-03-21 NOTE — Progress Notes (Signed)
Internal Medicine Clinic Attending  Case discussed with Dr. Stann Mainland  At the time of the visit.  We reviewed the resident's history and exam and pertinent patient test results.  I agree with the assessment, diagnosis, and plan of care documented in the resident's note.   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 In October 2023, evaluation at [facility] showed osteomyelitis of the residual 1-4 metatarsals. He underwent TMA revision 11/01/21 and was treated with 6-week course of IV daptomycin and oral ciprofloxacin. Intraoperative cultures were MRSA positive. He had follow-up with podiatry and ID and completed IV abx. He was seen in the [facility] 01/26/22 for worsening pain and drainage from his foot wound. ESR and CRP 58 and 2.5, respectively, at that time (129/94.7 10/29/21). He was seen in f/u in [facility] 02/01/22 and was prescribed doxycycline due to concern for persistent/recurrent osteomyelitis. MRI of the foot was ordered at that time. MRI was not able to be completed until 2/16 with findings c/f OM in the 2-4 metatarsals. Dr. Stann Mainland has spoken with patient's podiatrist, Dr. Gean Quint, who does not feel there is any further surgical intervention to offer from a podiatry standpoint. I discussed this case with ID, Dr. Johnnye Sima, and will continue suppressive doxycycline bid for now. We will plan to trend inflammatory markers at next visit and will send referral to Dr. Johnnye Sima to follow. Dr. Stann Mainland will plan to speak with Mr. Huneke regarding the imaging findings and potential treatment options, including potential referral to orthopedic surgery, at upcoming visit.

## 2022-03-21 NOTE — Addendum Note (Signed)
Addended by: Iona Coach on: 03/21/2022 01:36 PM   Modules accepted: Orders

## 2022-03-26 ENCOUNTER — Encounter (HOSPITAL_BASED_OUTPATIENT_CLINIC_OR_DEPARTMENT_OTHER): Payer: Medicaid Other | Admitting: General Surgery

## 2022-03-26 DIAGNOSIS — E11621 Type 2 diabetes mellitus with foot ulcer: Secondary | ICD-10-CM | POA: Diagnosis not present

## 2022-03-27 NOTE — Progress Notes (Signed)
[place], [name] ([id number]) [id number] Page 1 of 9 Visit Report for 03/26/2022 Arrival Information Details Patient Name: Date of Service: Samuel Pigg MA R 03/26/2022 3:30 PM Medical Record Number: [id number] Patient Account Number: [id number] Date of Birth/Sex: Treating RN: Dec 30, 1965 (57 y.o. [name], Vaughan Basta Primary Care Willella Harding: Nani Gasser Other Clinician: Referring Elicia Lui: Treating Marveen Donlon/Extender: [place] in Treatment: 7 Visit Information History Since Last Visit Added or deleted any medications: No Patient Arrived: Ambulatory Any new allergies or adverse reactions: No Arrival Time: 15:20 Had a fall or experienced change in No Accompanied By: son activities of daily living that may affect Transfer Assistance: None risk of falls: Patient Identification Verified: Yes Signs or symptoms of abuse/neglect since No Secondary Verification Process Completed: Yes last visito Patient Requires Transmission-Based Precautions: No Hospitalized since last visit: No Implantable device outside of the clinic No excluding cellular tissue based products placed in the center since last visit: Has Dressing in Place as Prescribed: Yes Has Footwear/Offloading in Place as Yes Prescribed: Left: Surgical Shoe with Pressure Relief Insole Right: Surgical Shoe with Pressure Relief Insole Pain Present Now: No Electronic Signature(s) Signed: 03/26/2022 5:30:43 PM By: Baruch Gouty RN, BSN Entered By: Baruch Gouty on 03/26/2022 15:29:28 -------------------------------------------------------------------------------- Encounter Discharge Information Details Patient Name: Date of Service: Samuel Pigg MA R 03/26/2022 3:30 PM Medical Record Number: [id number] Patient Account Number: [id number] Date of Birth/Sex: Treating RN: 1965-06-26 (58 y.o. [name] Primary Care [name]: Nani Gasser Other Clinician: Referring  Lamoine Magallon: Treating [name]/Extender: [place] in Treatment: 7 Encounter Discharge Information Items Post Procedure Vitals Discharge Condition: Stable Temperature (F): 98 Ambulatory Status: Ambulatory Pulse (bpm): 80 Discharge Destination: Home Respiratory Rate (breaths/min): 18 Transportation: Private Auto Blood Pressure (mmHg): 144/81 Accompanied By: son Schedule Follow-up Appointment: Yes Clinical Summary of Care: Patient Declined Electronic Signature(s) Signed: 03/26/2022 5:30:43 PM By: Baruch Gouty RN, BSN Entered By: Baruch Gouty on 03/26/2022 16:20:17 -------------------------------------------------------------------------------- Lower Extremity Assessment Details Patient Name: Date of Service: Samuel Pigg MA R 03/26/2022 3:30 PM Medical Record Number: [id number] Patient Account Number: [id number] Date of Birth/Sex: Treating RN: 04/09/1965 (57 y.o. [name] Primary Care Mozell Hardacre: Nani Gasser Other Clinician: [name] ([id number]) [id number] Page 2 of 9 Referring [name]: Treating Massimiliano Rohleder/Extender: [place] in Treatment: 7 Edema Assessment Assessed: [Left: No] [Right: No] Edema: [Left: No] [Right: No] Calf Left: Right: Point of Measurement: From Medial Instep 36 cm 35 cm Ankle Left: Right: Point of Measurement: From Medial Instep 24 cm 23.5 cm Vascular Assessment Pulses: Dorsalis Pedis Palpable: [Left:Yes] [Right:Yes] Electronic Signature(s) Signed: 03/26/2022 5:30:43 PM By: Baruch Gouty RN, BSN Entered By: Baruch Gouty on 03/26/2022 15:35:00 -------------------------------------------------------------------------------- Multi Wound Chart Details Patient Name: Date of Service: Samuel Pigg MA R 03/26/2022 3:30 PM Medical Record Number: [id number] Patient Account Number: [id number] Date of Birth/Sex: Treating RN: 1965/05/25 (57 y.o. M) Primary  Care Dervin Vore: Nani Gasser Other Clinician: Referring Aissatou Fronczak: Treating [name]/Extender: [place] in Treatment: 7 Vital Signs Height(in): 54 Capillary Blood Glucose(mg/dl): 75 Weight(lbs): 214 Pulse(bpm): 57 Body Mass Index(BMI): 29 Blood Pressure(mmHg): 144/81 Temperature(F): 98 Respiratory Rate(breaths/min): 18 [1:Photos:] [N/A:N/A] Left Amputation Site - Transmetatarsal Right Amputation Site - N/A Wound Location: Transmetatarsal Surgical Injury Surgical Injury N/A Wounding Event: Diabetic Wound/Ulcer of the Lower Diabetic Wound/Ulcer of the Lower N/A Primary Etiology: Extremity Extremity Lymphedema, Hypertension, Type II Lymphedema, Hypertension, Type II N/A Comorbid History: Diabetes, Neuropathy Diabetes, Neuropathy 02/01/2022 10/30/2021 N/A Date  Acquired: 7 7 N/A Weeks of Treatment: Open Open N/A Wound Status: No No N/A Wound Recurrence: 0.2x0.2x0.4 1.1x0.4x0.3 N/A Measurements L x W x D (cm) 0.031 0.346 N/A A (cm) : rea 0.013 0.104 N/A Volume (cm) : 98.00% 97.20% N/A % Reduction in A rea: 91.70% 91.60% N/A % Reduction in Volume: 12 Starting Position 1 (o'clock): 12 Ending Position 1 (o'clock): Hernandez, Samuel ([id number] Page 3 of 9 0.4 Maximum Distance 1 (cm): Yes No N/A Undermining: Grade 1 Grade 1 N/A Classification: Small Medium N/A Exudate A mount: Serosanguineous Serosanguineous N/A Exudate Type: red, brown red, brown N/A Exudate Color: Well defined, not attached Thickened N/A Wound Margin: Small (1-33%) Large (67-100%) N/A Granulation A mount: Red Red N/A Granulation Quality: None Present (0%) Small (1-33%) N/A Necrotic A mount: Fat Layer (Subcutaneous Tissue): Yes Fat Layer (Subcutaneous Tissue): Yes N/A Exposed Structures: Fascia: No Fascia: No Tendon: No Tendon: No Muscle: No Muscle: No Joint: No Joint: No Bone: No Bone: No Small (1-33%) Small  (1-33%) N/A Epithelialization: Debridement - Selective/Open Wound Debridement - Selective/Open Wound N/A Debridement: Pre-procedure Verification/Time Out 15:50 15:50 N/A Taken: Lidocaine 4% T opical Solution Lidocaine 4% T opical Solution N/A Pain Control: Callus, Slough Callus, Slough N/A Tissue Debrided: Non-Viable Tissue Non-Viable Tissue N/A Level: 6.25 0.9 N/A Debridement A (sq cm): rea Curette Curette N/A Instrument: Minimum Minimum N/A Bleeding: Pressure Pressure N/A Hemostasis A chieved: 0 0 N/A Procedural Pain: 0 0 N/A Post Procedural Pain: Procedure was tolerated well Procedure was tolerated well N/A Debridement Treatment Response: 0.6x0.4x0.2 1.1x0.4x0.3 N/A Post Debridement Measurements L x W x D (cm) 0.038 0.104 N/A Post Debridement Volume: (cm) Callus: Yes Callus: Yes N/A Periwound Skin Texture: Dry/Scaly: Yes Maceration: Yes N/A Periwound Skin Moisture: Maceration: No Dry/Scaly: No No Abnormalities Noted No Abnormalities Noted N/A Periwound Skin Color: No Abnormality No Abnormality N/A Temperature: Yes N/A N/A Tenderness on Palpation: Debridement Debridement N/A Procedures Performed: Treatment Notes Wound #1 (Amputation Site - Transmetatarsal) Wound Laterality: Left Cleanser Soap and Water Discharge Instruction: May shower and wash wound with dial antibacterial soap and water prior to dressing change. Peri-Wound Care Sween Lotion (Moisturizing lotion) Discharge Instruction: Apply moisturizing lotion as directed Topical Primary Dressing Sorbalgon AG Dressing, 4x4 (in/in) Discharge Instruction: Apply to wound bed as instructed Secondary Dressing Optifoam Non-Adhesive Dressing, 4x4 in Discharge Instruction: Apply over primary dressing cut to make foam donut Woven Gauze Sponges 2x2 in Discharge Instruction: Apply over primary dressing as directed. Secured With Elastic Bandage 4 inch (ACE bandage) Discharge Instruction: Secure with ACE  bandage as directed. Kerlix Roll Sterile, 4.5x3.1 (in/yd) Discharge Instruction: Secure with Kerlix as directed. 43M Medipore H Soft Cloth Surgical T ape, 4 x 10 (in/yd) Discharge Instruction: Secure with tape as directed. Compression Wrap Compression Stockings Add-Ons [place], [name] ([id number]) [id number] Page 4 of 9 Wound #2 (Amputation Site - Transmetatarsal) Wound Laterality: Right Cleanser Soap and Water Discharge Instruction: May shower and wash wound with dial antibacterial soap and water prior to dressing change. Peri-Wound Care Sween Lotion (Moisturizing lotion) Discharge Instruction: Apply moisturizing lotion as directed Topical Primary Dressing Sorbalgon AG Dressing, 4x4 (in/in) Discharge Instruction: Apply to wound bed as instructed Secondary Dressing Optifoam Non-Adhesive Dressing, 4x4 in Discharge Instruction: Apply over primary dressing cut to make foam donut Woven Gauze Sponges 2x2 in Discharge Instruction: Apply over primary dressing as directed. Secured With Elastic Bandage 4 inch (ACE bandage) Discharge Instruction: Secure with ACE bandage as directed. Kerlix Roll Sterile, 4.5x3.1 (in/yd) Discharge Instruction: Secure with Kerlix as  directed. 64M Medipore H Soft Cloth Surgical T ape, 4 x 10 (in/yd) Discharge Instruction: Secure with tape as directed. Compression Wrap Compression Stockings Add-Ons Electronic Signature(s) Signed: 03/26/2022 4:44:08 PM By: Fredirick Maudlin MD FACS Entered By: Fredirick Maudlin on 03/26/2022 16:44:08 -------------------------------------------------------------------------------- Multi-Disciplinary Care Plan Details Patient Name: Date of Service: Samuel Pigg MA R 03/26/2022 3:30 PM Medical Record Number: [id number] Patient Account Number: [id number] Date of Birth/Sex: Treating RN: [birth year] (57 y.o. [name] Primary Care Shawna Wearing: Nani Gasser Other Clinician: Referring Buster Schueller: Treating  Shamarion Coots/Extender: [place] in Treatment: [place] reviewed with physician Active Inactive Nutrition Nursing Diagnoses: Impaired glucose control: actual or potential Goals: Patient/caregiver verbalizes understanding of need to maintain therapeutic glucose control per primary care physician Date Initiated: 02/02/2022 Target Resolution Date: 03/29/2022 Goal Status: Active Interventions: Assess patient nutrition upon admission and as needed per policy Provide education on elevated blood sugars and impact on wound healing Treatment Activities: Dietary management education, guidance and counseling : 02/02/2022 [name] ([id number]) [id number] Page 5 of 9 Notes: Wound/Skin Impairment Nursing Diagnoses: Impaired tissue integrity Goals: Patient/caregiver will verbalize understanding of skin care regimen Date Initiated: 03/06/2022 Target Resolution Date: 03/29/2022 Goal Status: Active Ulcer/skin breakdown will have a volume reduction of 30% by week 4 Date Initiated: 02/02/2022 Target Resolution Date: 03/29/2022 Goal Status: Active Interventions: Assess ulceration(s) every visit Provide education on ulcer and skin care Treatment Activities: Patient referred to home care : 02/02/2022 Skin care regimen initiated : 02/02/2022 Notes: Electronic Signature(s) Signed: 03/26/2022 5:30:43 PM By: Baruch Gouty RN, BSN Entered By: Baruch Gouty on 03/26/2022 15:45:49 -------------------------------------------------------------------------------- Pain Assessment Details Patient Name: Date of Service: Samuel Pigg MA R 03/26/2022 3:30 PM Medical Record Number: [id number] Patient Account Number: [id number] Date of Birth/Sex: Treating RN: 1965-10-07 (57 y.o. [name] Primary Care Marylouise Mallet: Nani Gasser Other Clinician: Referring Alexx Mcburney: Treating Jaivon Vanbeek/Extender: [place] in  Treatment: 7 Active Problems Location of Pain Severity and Description of Pain Patient Has Paino No Site Locations Rate the pain. Current Pain Level: 0 Pain Management and Medication Current Pain Management: Electronic Signature(s) Signed: 03/26/2022 5:30:43 PM By: Baruch Gouty RN, BSN Entered By: Baruch Gouty on 03/26/2022 15:32:18 [name] ([id number]) [phone].pdf Page 6 of 9 -------------------------------------------------------------------------------- Patient/Caregiver Education Details Patient Name: Date of Service: [name] [place] R 2/26/2024andnbsp3:30 PM Medical Record Number: [id number] Patient Account Number: [id number] Date of Birth/Gender: Treating RN: 13-Jan-1966 (57 y.o. [name] Primary Care Physician: Nani Gasser Other Clinician: Referring Physician: Treating Physician/Extender: [place] in Treatment: 7 Education Assessment Education Provided To: Patient Education Topics Provided Offloading: Methods: Explain/Verbal Responses: Reinforcements needed, State content correctly Wound/Skin Impairment: Methods: Explain/Verbal Responses: Reinforcements needed, State content correctly Electronic Signature(s) Signed: 03/26/2022 5:30:43 PM By: Baruch Gouty RN, BSN Entered By: Baruch Gouty on 03/26/2022 15:46:21 -------------------------------------------------------------------------------- Wound Assessment Details Patient Name: Date of Service: Samuel Pigg MA R 03/26/2022 3:30 PM Medical Record Number: [id number] Patient Account Number: [id number] Date of Birth/Sex: Treating RN: 07-Dec-1965 (57 y.o. [name] Primary Care Jerra Huckeby: Nani Gasser Other Clinician: Referring Sharnita Bogucki: Treating Jaine Estabrooks/Extender: [place] in Treatment: 7 Wound Status Wound Number: 1 Primary Etiology: Diabetic Wound/Ulcer of the Lower Extremity Wound Location:  Left Amputation Site - Transmetatarsal Wound Status: Open Wounding Event: Surgical Injury Comorbid Lymphedema, Hypertension, Type II Diabetes, Neuropathy History: Date Acquired: 02/01/2022 Weeks Of Treatment: 7 Clustered Wound: No Photos Wound Measurements Length: (cm) 0.2  Width: (cm) 0.2 Depth: (cm) 0.4 Area: (cm) 0. Volume: (cm) 0. BLOWERS, [place] ([id number]) % Reduction in Area: 98% % Reduction in Volume: 91.7% Epithelialization: Small (1-33%) 031 Tunneling: No 013 Undermining: Yes Starting Position (o'clock): 12 Ending Position (o'clock): 12 Maximum Distance: (cm) 0.4 [id number] Page 7 of 9 Wound Description Classification: Grade 1 Wound Margin: Well defined, not attached Exudate Amount: Small Exudate Type: Serosanguineous Exudate Color: red, brown Foul Odor After Cleansing: No Slough/Fibrino No Wound Bed Granulation Amount: Small (1-33%) Exposed Structure Granulation Quality: Red Fascia Exposed: No Necrotic Amount: None Present (0%) Fat Layer (Subcutaneous Tissue) Exposed: Yes Tendon Exposed: No Muscle Exposed: No Joint Exposed: No Bone Exposed: No Periwound Skin Texture Texture Color No Abnormalities Noted: No No Abnormalities Noted: Yes Callus: Yes Temperature / Pain Temperature: No Abnormality Moisture No Abnormalities Noted: No Tenderness on Palpation: Yes Dry / Scaly: Yes Maceration: No Treatment Notes Wound #1 (Amputation Site - Transmetatarsal) Wound Laterality: Left Cleanser Soap and Water Discharge Instruction: May shower and wash wound with dial antibacterial soap and water prior to dressing change. Peri-Wound Care Sween Lotion (Moisturizing lotion) Discharge Instruction: Apply moisturizing lotion as directed Topical Primary Dressing Sorbalgon AG Dressing, 4x4 (in/in) Discharge Instruction: Apply to wound bed as instructed Secondary Dressing Optifoam Non-Adhesive Dressing, 4x4 in Discharge Instruction: Apply over  primary dressing cut to make foam donut Woven Gauze Sponges 2x2 in Discharge Instruction: Apply over primary dressing as directed. Secured With Elastic Bandage 4 inch (ACE bandage) Discharge Instruction: Secure with ACE bandage as directed. Kerlix Roll Sterile, 4.5x3.1 (in/yd) Discharge Instruction: Secure with Kerlix as directed. 22M Medipore H Soft Cloth Surgical T ape, 4 x 10 (in/yd) Discharge Instruction: Secure with tape as directed. Compression Wrap Compression Stockings Add-Ons Electronic Signature(s) Signed: 03/26/2022 5:30:43 PM By: Baruch Gouty RN, BSN Entered By: Baruch Gouty on 03/26/2022 15:44:44 [name] ([id number]) [id number] Page 8 of 9 -------------------------------------------------------------------------------- Wound Assessment Details Patient Name: Date of Service: Samuel Pigg MA R 03/26/2022 3:30 PM Medical Record Number: [id number] Patient Account Number: [id number] Date of Birth/Sex: Treating RN: April 13, 1965 (57 y.o. [name] Primary Care Burnie Hank: Nani Gasser Other Clinician: Referring Deno Sida: Treating Malic Rosten/Extender: [place] in Treatment: 7 Wound Status Wound Number: 2 Primary Etiology: Diabetic Wound/Ulcer of the Lower Extremity Wound Location: Right Amputation Site - Transmetatarsal Wound Status: Open Wounding Event: Surgical Injury Comorbid Lymphedema, Hypertension, Type II Diabetes, Neuropathy History: Date Acquired: 10/30/2021 Weeks Of Treatment: 7 Clustered Wound: No Photos Wound Measurements Length: (cm) 1.1 Width: (cm) 0.4 Depth: (cm) 0.3 Area: (cm) 0.346 Volume: (cm) 0.104 % Reduction in Area: 97.2% % Reduction in Volume: 91.6% Epithelialization: Small (1-33%) Tunneling: No Undermining: No Wound Description Classification: Grade 1 Wound Margin: Thickened Exudate Amount: Medium Exudate Type: Serosanguineous Exudate Color: red, brown Foul Odor  After Cleansing: No Slough/Fibrino Yes Wound Bed Granulation Amount: Large (67-100%) Exposed Structure Granulation Quality: Red Fascia Exposed: No Necrotic Amount: Small (1-33%) Fat Layer (Subcutaneous Tissue) Exposed: Yes Necrotic Quality: Adherent Slough Tendon Exposed: No Muscle Exposed: No Joint Exposed: No Bone Exposed: No Periwound Skin Texture Texture Color No Abnormalities Noted: No No Abnormalities Noted: Yes Callus: Yes Temperature / Pain Temperature: No Abnormality Moisture No Abnormalities Noted: Yes Treatment Notes Wound #2 (Amputation Site - Transmetatarsal) Wound Laterality: Right Cleanser Soap and Water Discharge Instruction: May shower and wash wound with dial antibacterial soap and water prior to dressing change. Peri-Wound Care Sween Lotion (Moisturizing lotion) Discharge Instruction: Apply moisturizing lotion as directed EDRIC, BIERCE ([id number]) [id number]  Page 9 of 9 Topical Primary Dressing Sorbalgon AG Dressing, 4x4 (in/in) Discharge Instruction: Apply to wound bed as instructed Secondary Dressing Optifoam Non-Adhesive Dressing, 4x4 in Discharge Instruction: Apply over primary dressing cut to make foam donut Woven Gauze Sponges 2x2 in Discharge Instruction: Apply over primary dressing as directed. Secured With Elastic Bandage 4 inch (ACE bandage) Discharge Instruction: Secure with ACE bandage as directed. Kerlix Roll Sterile, 4.5x3.1 (in/yd) Discharge Instruction: Secure with Kerlix as directed. 88M Medipore H Soft Cloth Surgical T ape, 4 x 10 (in/yd) Discharge Instruction: Secure with tape as directed. Compression Wrap Compression Stockings Add-Ons Electronic Signature(s) Signed: 03/26/2022 5:30:43 PM By: Baruch Gouty RN, BSN Entered By: Baruch Gouty on 03/26/2022 15:45:18 -------------------------------------------------------------------------------- Vitals Details Patient Name: Date of Service: Samuel Pigg  MA R 03/26/2022 3:30 PM Medical Record Number: [id number] Patient Account Number: [id number] Date of Birth/Sex: Treating RN: 05-Apr-1965 (57 y.o. [name] Primary Care Arva Slaugh: Nani Gasser Other Clinician: Referring Jessye Imhoff: Treating Tulio Facundo/Extender: [place] in Treatment: 7 Vital Signs Time Taken: 15:30 Temperature (F): 98 Height (in): 72 Pulse (bpm): 80 Weight (lbs): 214 Respiratory Rate (breaths/min): 18 Body Mass Index (BMI): 29 Blood Pressure (mmHg): 144/81 Capillary Blood Glucose (mg/dl): 75 Reference Range: 80 - 120 mg / dl Notes glucose per pt report this am Electronic Signature(s) Signed: 03/26/2022 5:30:43 PM By: Baruch Gouty RN, BSN Entered By: Baruch Gouty on 03/26/2022 15:32:33

## 2022-03-27 NOTE — Progress Notes (Signed)
[name], [name] ([id number]) [id number] Page 1 of 9 Visit Report for 03/26/2022 Chief Complaint Document Details Patient Name: Date of Service: Samuel Pigg MA R 03/26/2022 3:30 PM Medical Record Number: [id number] Patient Account Number: [id number] Date of Birth/Sex: Treating RN: 01-Jul-1965 (57 y.o. 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 [name] Primary Care Provider: Nani Hernandez Other Clinician: Referring Provider: Treating Provider/Extender: [name] in Treatment: 7 Debridement Performed for Assessment: Wound #2 Right Amputation Site - Transmetatarsal Performed By: Physician Samuel Maudlin, MD Debridement Type: Debridement Severity of Tissue Pre Debridement: Fat layer exposed Level of Consciousness (Pre-procedure): Awake and Alert Pre-procedure Verification/Time Out Yes - 15:50 Taken: Start Time: 15:51 Pain Control: Lidocaine 4% T opical Solution T Area Debrided (L x W): otal 1.5 (cm) x 0.6 (cm) = 0.9 (cm) Tissue and other material debrided: Non-Viable, Callus, Slough, Slough Level: Non-Viable Tissue Debridement  Description: Selective/Open Wound Instrument: Curette Bleeding: Minimum Hemostasis Achieved: Pressure Procedural Pain: 0 Post Procedural Pain: 0 Response to Treatment: Procedure was tolerated well Level of Consciousness (Post- Awake and Alert procedure): Post Debridement Measurements of Total Wound Length: (cm) 1.1 Width: (cm) 0.4 Depth: (cm) 0.3 Volume: (cm) 0.104 Character of Wound/Ulcer Post Debridement: Improved Severity of Tissue Post Debridement: Fat layer exposed Post Procedure Diagnosis Same as Pre-procedure Notes scribed by Samuel Gouty, RN for Dr. Celine Hernandez Electronic Signature(s) [place], [place] ([id number]) [id number] Page 2 of 9 Signed: 03/26/2022 5:09:54 PM By: Samuel Maudlin MD FACS Signed: 03/26/2022 5:30:43 PM By: Samuel Gouty RN, BSN Entered By: [name] on 03/26/2022 15:53:04 -------------------------------------------------------------------------------- Debridement Details Patient Name: Date of Service: Samuel Pigg MA R 03/26/2022 3:30 PM Medical Record Number: [id number] Patient Account Number: [id number] Date of Birth/Sex: Treating RN: [birth year] (57 y.o. [name] Primary Care Provider: Nani Hernandez Other Clinician: Referring Provider: Treating Provider/Extender: [name] in Treatment: 7 Debridement Performed for Assessment: Wound #1 Left Amputation Site - Transmetatarsal Performed By: Physician Samuel Maudlin, MD Debridement Type: Debridement Severity of Tissue Pre Debridement: Fat layer exposed Level of Consciousness (Pre-procedure): Awake and Alert Pre-procedure Verification/Time Out Yes - 15:50 Taken: Start Time: 15:51 Pain Control: Lidocaine 4% T opical Solution T Area Debrided (L x W): otal 2.5 (cm) x 2.5 (cm) = 6.25 (cm) Tissue and other material debrided: Non-Viable, Callus, Slough, Slough Level: Non-Viable Tissue Debridement Description: Selective/Open  Wound Instrument: Curette Bleeding: Minimum Hemostasis Achieved: Pressure Procedural Pain: 0 Post Procedural Pain: 0 Response to Treatment: Procedure was tolerated well Level of Consciousness (Post- Awake and Alert procedure): Post Debridement Measurements of Total Wound Length: (cm) 0.6 Width: (cm) 0.4 Depth: (cm) 0.2 Volume: (cm) 0.038 Character of Wound/Ulcer Post Debridement: Improved Severity of Tissue Post Debridement: Fat layer exposed Post Procedure Diagnosis Same as Pre-procedure Notes scribed by Samuel Gouty, RN for Dr. Celine Hernandez Electronic Signature(s) Signed: 03/26/2022 5:09:54 PM By: Samuel Maudlin MD FACS Signed: 03/26/2022 5:30:43 PM By: Samuel Roles  Jacques Hernandez, BSN Entered By: [name] on 03/26/2022 15:56:48 -------------------------------------------------------------------------------- HPI Details Patient Name: Date of Service: Samuel Pigg MA R 03/26/2022 3:30 PM Medical Record Number: [id number] Patient Account Number: [id number] Date of Birth/Sex: Treating RN: 1965-10-24 (57 y.o. M) Primary Care Provider: Nani Hernandez Other Clinician: Referring Provider: Treating Provider/Extender: [name] in Treatment: 7 History of Present Illness HPI Description: ADMISSION 02/02/2022 This is a 57 year old [place] man who speaks only Arabic. The visit today was conducted with the assistance of the language line interpreter; the patient declines the use of an in person interpreter. He is a poorly controlled diabetic (last hemoglobin A1c 10.8, but it has been as high as 15 in the past) with CKD [facility] [place] ([id number]) [phone].pdf Page 3 of 9 stage III and hypertension. 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 He required revision of the right TMA in October. This was done in the [facility] system. It is not  entirely clear how he was referred to our clinic however he has openings on the distal portion of the right foot as well as drainage coming from the plantar aspect of his left foot underneath some callus. The patient has just been applying dry gauze to both areas. 02/09/2022: The left-sided wound is quite a bit smaller today, but is covered with callus. The right sided wound has also contracted a little bit and is very clean, but there is also callus accumulation around the perimeter. Unfortunately, due to his Medicaid status, we have been unable to secure home health assistance and he has been changing the dressings on his own. He also has difficulty obtaining dressing supplies. 02/19/2022: Both wounds are smaller, particularly on the right. The left is covered with a layer of callus. No concern for infection. 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 There is light slough on the surface with some surrounding callus. The left plantar foot wound has callused over. Underneath the callus, the there is a small opening in the skin. 03/06/2022: The wounds are about the same size this week. There is callus accumulation around each, but the surfaces are cleaner. 03/19/2022: The wound on the right is quite a bit smaller this week. There is minimal callus accumulation and light slough on the surface. The wound on the left is almost completely covered in callus. 03/26/2022: The wound on the right continues to contract and is narrower again this week. The wound on the left has become covered in callus once again. Electronic Signature(s) Signed: 03/26/2022 4:44:49 PM By: Samuel Maudlin MD FACS Entered By: [name] on 03/26/2022 16:44:48 -------------------------------------------------------------------------------- Physical Exam Details Patient Name: Date of Service: Samuel Pigg MA R 03/26/2022 3:30 PM Medical Record Number: [id number] Patient Account Number:  [id number] Date of Birth/Sex: Treating RN: 03-31-1965 (57 y.o. M) Primary Care Provider: Nani Hernandez Other Clinician: Referring Provider: Treating Provider/Extender: [name] in Treatment: 7 Constitutional Slightly hypertensive. . . . no acute distress. Respiratory Normal work of breathing on room air. Notes 03/26/2022: The wound on the right continues to contract and is narrower again this week. The wound on the left has become covered in callus once again. Electronic Signature(s) Signed: 03/26/2022 4:45:25 PM By: Samuel Maudlin MD FACS Entered By: [name] on 03/26/2022 16:45:25 -------------------------------------------------------------------------------- Physician Orders Details Patient Name: Date of Service: Samuel Pigg MA R 03/26/2022 3:30 PM Medical Record Number: [id number] Patient Account Number:  [id number] Date of Birth/Sex: Treating RN: [birth year] (57 y.o. 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 - Dr. Celine Hernandez +++ INTERPRETER [facility] Return A RM 1 Monday 3/4 @ 10:30 am [place]EN, CHIHU[place] ([id number]) [id number] Page 4 of 9 Anesthetic Wound #1 Left Amputation Site - Transmetatarsal (In clinic) Topical Lidocaine 4% applied to wound bed Bathing/ Shower/ Hygiene May shower and wash wound with soap and water. Off-Loading Wound #2 Right Amputation  Site - Transmetatarsal DH Walker Boot to: - right foot Wound Treatment Wound #1 - Amputation Site - Transmetatarsal Wound Laterality: Left Cleanser: Soap and Water 1 x Per Day/30 Days Discharge Instructions: May shower and wash wound with dial antibacterial soap and water prior to dressing change. Peri-Wound Care: Sween Lotion (Moisturizing lotion) 1 x Per Day/30 Days Discharge Instructions: Apply moisturizing lotion as directed Prim Dressing: Sorbalgon AG Dressing, 4x4 (in/in) 1 x Per Day/30 Days ary Discharge Instructions: Apply to wound bed as instructed Secondary Dressing: Optifoam Non-Adhesive Dressing, 4x4 in 1 x Per Day/30 Days Discharge Instructions: Apply over primary dressing cut to make foam donut Secondary Dressing: Woven Gauze Sponges 2x2 in 1 x Per Day/30 Days Discharge Instructions: Apply over primary dressing as directed. Secured With: Elastic Bandage 4 inch (ACE bandage) 1 x Per Day/30 Days Discharge Instructions: Secure with ACE bandage as directed. Secured With: [employer], 4.5x3.1 (in/yd) 1 x Per Day/30 Days Discharge Instructions: Secure with Kerlix as directed. Secured With: 32M Medipore H Soft Cloth Surgical T ape, 4 x 10 (in/yd) 1 x Per Day/30 Days Discharge Instructions: Secure with tape as directed. Wound #2 - Amputation Site - Transmetatarsal Wound Laterality: Right Cleanser: Soap and Water 1 x Per Day/30 Days Discharge Instructions: May shower and wash wound with dial antibacterial soap and water prior to dressing change. Peri-Wound Care: Sween Lotion (Moisturizing lotion) 1 x Per Day/30 Days Discharge Instructions: Apply moisturizing lotion as directed Prim Dressing: Sorbalgon AG Dressing, 4x4 (in/in) 1 x Per Day/30 Days ary Discharge Instructions: Apply to wound bed as instructed Secondary Dressing: Optifoam Non-Adhesive Dressing, 4x4 in 1 x Per Day/30 Days Discharge Instructions: Apply over primary dressing cut to make foam donut Secondary  Dressing: Woven Gauze Sponges 2x2 in 1 x Per Day/30 Days Discharge Instructions: Apply over primary dressing as directed. Secured With: Elastic Bandage 4 inch (ACE bandage) 1 x Per Day/30 Days Discharge Instructions: Secure with ACE bandage as directed. Secured With: [employer], 4.5x3.1 (in/yd) 1 x Per Day/30 Days Discharge Instructions: Secure with Kerlix as directed. Secured With: 32M Medipore H Soft Cloth Surgical T ape, 4 x 10 (in/yd) 1 x Per Day/30 Days Discharge Instructions: Secure with tape as directed. Electronic Signature(s) Signed: 03/26/2022 5:09:54 PM By: Samuel Maudlin MD FACS Entered By: [name] on 03/26/2022 16:45:41 -------------------------------------------------------------------------------- Problem List Details Patient Name: Date of Service: Samuel Pigg MA R 03/26/2022 3:30 PM [name] ([id number] Page 5 of 9 Medical Record Number: [id number] Patient Account Number: [id number] Date of Birth/Sex: Treating RN: 09/06/1965 (57 y.o. 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 02/02/2022 No Yes without evidence of necrosis L97.522 Non-pressure chronic ulcer of other part of left foot with fat layer exposed 02/02/2022 No Yes E11.621 Type 2 diabetes mellitus with foot ulcer 02/02/2022 No Yes Z89.439 Acquired absence of unspecified foot 02/02/2022 No Yes Inactive Problems Resolved Problems Electronic Signature(s) Signed: 03/26/2022 4:39:09 PM By: Samuel Maudlin MD FACS Entered By: [name] on 03/26/2022 16:39:09 -------------------------------------------------------------------------------- Progress Note  Details Patient Name: Date of Service: Samuel Pigg MA R 03/26/2022 3:30 PM Medical Record Number: [id number] Patient Account Number: [id number] Date of Birth/Sex: Treating RN: [birth year] (57 y.o. 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 The visit today was conducted with the assistance of the language line interpreter; the patient declines the use of an in person interpreter. He is a poorly controlled diabetic (last hemoglobin A1c 10.8, but it has been as high as 15 in the past) with CKD stage III and hypertension. 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 He required revision of the right TMA in October. This was done in the [facility] system. It is not entirely clear how he was referred to our clinic however he has openings on the distal portion of the right foot as well as drainage coming from the plantar aspect of his left foot underneath some callus. The patient has just been applying dry gauze to both areas. 02/09/2022: The left-sided wound is quite a bit smaller today, but is covered with callus. The right sided wound has also contracted a little bit and is very clean, but there is also callus accumulation around the perimeter. Unfortunately, due to his Medicaid status, we have been unable to secure home health assistance and he has been changing the dressings on his own. He also has difficulty obtaining  dressing supplies. 02/19/2022: Both wounds are smaller, particularly on the right. The left is covered with a layer of callus. No concern for infection. 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 There is light slough on the surface with some surrounding callus. The left plantar foot wound has callused over. Underneath the callus, the there is a small opening in the skin. 03/06/2022: The wounds are about the same size this week. There is callus accumulation around each, but the surfaces are cleaner. [place], [name] ([id number]) [id number] Page 6 of 9 03/19/2022: The wound on the right is quite a bit smaller this week. There is minimal callus accumulation and light slough on the surface. The wound on the left is almost completely covered in callus. 03/26/2022: The wound on the right continues to contract and is narrower again this week. The wound on the left has become covered in callus once again. Patient History Family History Unknown History, Cancer, No family history of Diabetes, Heart Disease, Hereditary Spherocytosis, Hypertension, Kidney Disease, Lung Disease, Seizures, Stroke, Thyroid Problems, Tuberculosis. Social History Never smoker, Marital Status -  Married, Alcohol Use - Never, Drug Use - No History, Caffeine Use - Rarely. Medical History Hematologic/Lymphatic Patient has history of Lymphedema Cardiovascular Patient has history of Hypertension Endocrine Patient has history of Type II Diabetes Neurologic Patient has history of Neuropathy Medical A Surgical History Notes nd Genitourinary AKI Objective Constitutional Slightly hypertensive. no acute distress. Vitals Time Taken: 3:30 PM, Height: 72 in, Weight: 214 lbs, BMI: 29, Temperature: 98 F, Pulse: 80 bpm, Respiratory Rate: 18 breaths/min, Blood Pressure: 144/81 mmHg, Capillary Blood Glucose: 75 mg/dl. General Notes: glucose per pt report this am Respiratory Normal  work of breathing on room air. General Notes: 03/26/2022: The wound on the right continues to contract and is narrower again this week. The wound on the left has become covered in callus once again. Integumentary (Hair, Skin) Wound #1 status is Open. Original cause of wound was Surgical Injury. The date acquired was: 02/01/2022. The wound has been in treatment 7 weeks. The wound is located on the Left Amputation Site - Transmetatarsal. The wound measures 0.2cm length x 0.2cm width x 0.4cm depth; 0.031cm^2 area and 0.013cm^3 volume. There is Fat Layer (Subcutaneous Tissue) exposed. There is no tunneling noted, however, there is undermining starting at 12:00 and ending at 12:00 with a maximum distance of 0.4cm. There is a small amount of serosanguineous drainage noted. The wound margin is well defined and not attached to the wound base. There is small (1-33%) red granulation within the wound bed. There is no necrotic tissue within the wound bed. The periwound skin appearance had no abnormalities noted for color. The periwound skin appearance exhibited: Callus, Dry/Scaly. The periwound skin appearance did not exhibit: Maceration. Periwound temperature was noted as No Abnormality. The periwound has tenderness on palpation. Wound #2 status is Open. Original cause of wound was Surgical Injury. The date acquired was: 10/30/2021. The wound has been in treatment 7 weeks. The wound is located on the Right Amputation Site - Transmetatarsal. The wound measures 1.1cm length x 0.4cm width x 0.3cm depth; 0.346cm^2 area and 0.104cm^3 volume. There is Fat Layer (Subcutaneous Tissue) exposed. There is no tunneling or undermining noted. There is a medium amount of serosanguineous drainage noted. The wound margin is thickened. There is large (67-100%) red granulation within the wound bed. There is a small (1-33%) amount of necrotic tissue within the wound bed including Adherent Slough. The periwound skin appearance had no  abnormalities noted for moisture. The periwound skin appearance had no abnormalities noted for color. The periwound skin appearance exhibited: Callus. Periwound temperature was noted as No Abnormality. 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 There was a Selective/Open Wound Non-Viable Tissue Debridement with a total area of 6.25 sq cm performed by Samuel Maudlin, MD. With the following instrument(s): Curette to remove Non-Viable tissue/material. Material removed includes Callus and Slough and after achieving pain control using Lidocaine 4% Topical Solution. No specimens were taken. A time out was conducted at 15:50, prior to the start of the procedure. A Minimum amount of bleeding was controlled with Pressure. The procedure was  tolerated well with a pain level of 0 throughout and a pain level of 0 following the procedure. Post Debridement Measurements: 0.6cm length x 0.4cm width x 0.2cm depth; 0.038cm^3 volume. Character of Wound/Ulcer Post Debridement is improved. Severity of Tissue Post Debridement is: Fat layer exposed. Post procedure Diagnosis Wound #1: Same as Pre-Procedure General Notes: scribed by Samuel Gouty, RN for Dr. Celine Hernandez. Wound #2 Pre-procedure diagnosis of Wound #2 is a Diabetic Wound/Ulcer of the Lower Extremity located on the Right Amputation Site - Transmetatarsal .Severity of Tissue Pre Debridement is: Fat layer exposed. There  was a Selective/Open Wound Non-Viable Tissue Debridement with a total area of 0.9 sq cm performed by Samuel Maudlin, MD. With the following instrument(s): Curette to remove Non-Viable tissue/material. Material removed includes Callus and Slough and after achieving pain control using Lidocaine 4% Topical Solution. No specimens were taken. A time out was conducted at 15:50, prior to the start of the procedure. A Minimum amount of bleeding was controlled with Pressure. The procedure was tolerated well with a pain level of 0 throughout and a pain level of 0 following the procedure. Post Debridement Measurements: 1.1cm length x 0.4cm width x 0.3cm depth; 0.104cm^3 volume. Character of Wound/Ulcer Post Debridement is improved. Severity of Tissue Post Debridement is: Fat layer exposed. Post procedure Diagnosis Wound #2: Same as Pre-Procedure General Notes: scribed by Samuel Gouty, RN for Dr. Celine Hernandez. Plan Follow-up Appointments: Return Appointment in 1 week. - Dr. Celine Hernandez +++ INTERPRETER [facility] RM 1 Monday 3/4 @ 10:30 am Anesthetic: Wound #1 Left Amputation Site - Transmetatarsal: (In clinic) Topical Lidocaine 4% applied to wound bed Bathing/ Shower/ Hygiene: May shower and wash wound with soap and water. Off-Loading: Wound #2 Right Amputation Site - Transmetatarsal: DH Walker Boot to: - right foot WOUND #1: - Amputation Site - Transmetatarsal Wound Laterality: Left Cleanser: Soap and Water 1 x Per Day/30 Days Discharge Instructions: May shower and wash wound with dial antibacterial soap and water prior to dressing change. Peri-Wound Care: Sween Lotion (Moisturizing lotion) 1 x Per Day/30 Days Discharge Instructions: Apply moisturizing lotion as directed Prim Dressing: Sorbalgon AG Dressing, 4x4 (in/in) 1 x Per Day/30 Days ary Discharge Instructions: Apply to wound bed as instructed Secondary Dressing: Optifoam Non-Adhesive Dressing, 4x4 in 1 x Per Day/30 Days Discharge Instructions:  Apply over primary dressing cut to make foam donut Secondary Dressing: Woven Gauze Sponges 2x2 in 1 x Per Day/30 Days Discharge Instructions: Apply over primary dressing as directed. Secured With: Elastic Bandage 4 inch (ACE bandage) 1 x Per Day/30 Days Discharge Instructions: Secure with ACE bandage as directed. Secured With: [employer], 4.5x3.1 (in/yd) 1 x Per Day/30 Days Discharge Instructions: Secure with Kerlix as directed. Secured With: 67M Medipore H Soft Cloth Surgical T ape, 4 x 10 (in/yd) 1 x Per Day/30 Days Discharge Instructions: Secure with tape as directed. WOUND #2: - Amputation Site - Transmetatarsal Wound Laterality: Right Cleanser: Soap and Water 1 x Per Day/30 Days Discharge Instructions: May shower and wash wound with dial antibacterial soap and water prior to dressing change. Peri-Wound Care: Sween Lotion (Moisturizing lotion) 1 x Per Day/30 Days Discharge Instructions: Apply moisturizing lotion as directed Prim Dressing: Sorbalgon AG Dressing, 4x4 (in/in) 1 x Per Day/30 Days ary Discharge Instructions: Apply to wound bed as instructed Secondary Dressing: Optifoam Non-Adhesive Dressing, 4x4 in 1 x Per Day/30 Days Discharge Instructions: Apply over primary dressing cut to make foam donut Secondary Dressing: Woven Gauze Sponges 2x2 in 1 x Per Day/30 Days Discharge  Instructions: Apply over primary dressing as directed. Secured With: Elastic Bandage 4 inch (ACE bandage) 1 x Per Day/30 Days Discharge Instructions: Secure with ACE bandage as directed. Secured With: [employer], 4.5x3.1 (in/yd) 1 x Per Day/30 Days Discharge Instructions: Secure with Kerlix as directed. Secured With: 84M Medipore H Soft Cloth Surgical T ape, 4 x 10 (in/yd) 1 x Per Day/30 Days Discharge Instructions: Secure with tape as directed. 03/26/2022: The wound on the right continues to contract and is narrower again this week. The wound on the left has become covered in callus once  again. I used a curette to debride callus and slough from both of the wound sites. We will continue silver alginate to both wounds with foam donut and heel cup padding. Follow-up in 1 week. Electronic Signature(s) Signed: 03/26/2022 4:46:27 PM By: Samuel Maudlin MD [place], [name] ([id number]) [id number] Page 8 of 9 Signed: 03/26/2022 4:46:27 PM By: Samuel Maudlin MD FACS Entered By: [name] on 03/26/2022 16:46:27 -------------------------------------------------------------------------------- HxROS Details Patient Name: Date of Service: Samuel Pigg MA R 03/26/2022 3:30 PM Medical Record Number: [id number] Patient Account Number: [id number] Date of Birth/Sex: Treating RN: April 20, 1965 (57 y.o. M) Primary Care Provider: Nani Hernandez Other Clinician: Referring Provider: Treating Provider/Extender: [name] in Treatment: 7 Hematologic/Lymphatic Medical History: Positive for: Lymphedema Cardiovascular Medical History: Positive for: Hypertension Endocrine Medical History: Positive for: Type II Diabetes Treated with: Insulin, Oral agents Genitourinary Medical History: Past Medical History Notes: AKI Neurologic Medical History: Positive for: Neuropathy Immunizations Pneumococcal Vaccine: Received Pneumococcal Vaccination: Yes Received Pneumococcal Vaccination On or After 60th Birthday: Yes Implantable Devices No devices added Family and Social History Unknown History: Yes; Cancer: Yes; Diabetes: No; Heart Disease: No; Hereditary Spherocytosis: No; Hypertension: No; Kidney Disease: No; Lung Disease: No; Seizures: No; Stroke: No; Thyroid Problems: No; Tuberculosis: No; Never smoker; Marital Status - Married; Alcohol Use: Never; Drug Use: No History; Caffeine Use: Rarely; Financial Concerns: No; Food, Clothing or Shelter Needs: No; Support System Lacking: No; Transportation Concerns: No Electronic  Signature(s) Signed: 03/26/2022 5:09:54 PM By: Samuel Maudlin MD FACS Entered By: [name] on 03/26/2022 16:44:55 -------------------------------------------------------------------------------- SuperBill Details Patient Name: Date of Service: Samuel Pigg MA R 03/26/2022 Medical Record Number: [id number] Patient Account Number: [id number] Date of Birth/Sex: Treating RN: 09/03/1965 (57 y.o. 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 E11.621 Type 2 diabetes mellitus with foot ulcer Z89.439 Acquired absence of unspecified foot Facility Procedures : CPT4 Code: [id number] Description: [id number] - DEBRIDE WOUND 1ST 20 SQ CM OR < ICD-10 Diagnosis Description 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 wit L97.522 Non-pressure chronic ulcer of other part of left foot with fat layer  exposed Modifier: hout evidence of n Quantity: 1 ecrosis Physician Procedures : CPT4 Code Description Modifier [id number] - WC PHYS LEVEL 3 - EST PT 25 ICD-10 Diagnosis Description 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 without evidence of ne L97.522 Non-pressure chronic ulcer of  other part of left foot with fat layer exposed E11.621 Type 2 diabetes mellitus with foot ulcer Z89.439 Acquired absence of  unspecified foot Quantity: 1 crosis : [id number] - WC PHYS DEBR WO ANESTH 20 SQ CM ICD-10 Diagnosis Description L97.515 Non-pressure chronic ulcer of other part of right foot with  muscle involvement without evidence of ne L97.522 Non-pressure chronic ulcer of other part of left foot  with fat layer exposed Quantity: 1 crosis Electronic Signature(s) Signed: 03/26/2022 4:46:51 PM By: Samuel Maudlin MD FACS Entered By: [name] on 03/26/2022 16:46:51

## 2022-03-29 ENCOUNTER — Encounter: Payer: Medicaid Other | Admitting: Dietician

## 2022-03-29 ENCOUNTER — Other Ambulatory Visit (HOSPITAL_COMMUNITY): Payer: Self-pay

## 2022-03-29 ENCOUNTER — Ambulatory Visit: Payer: Medicaid Other

## 2022-03-29 ENCOUNTER — Encounter: Payer: Medicaid Other | Admitting: Internal Medicine

## 2022-03-29 VITALS — BP 138/82 | HR 80 | Temp 97.9°F | Wt 217.8 lb

## 2022-03-29 DIAGNOSIS — E1169 Type 2 diabetes mellitus with other specified complication: Secondary | ICD-10-CM | POA: Diagnosis not present

## 2022-03-29 DIAGNOSIS — Z7984 Long term (current) use of oral hypoglycemic drugs: Secondary | ICD-10-CM | POA: Diagnosis not present

## 2022-03-29 DIAGNOSIS — M86171 Other acute osteomyelitis, right ankle and foot: Secondary | ICD-10-CM

## 2022-03-29 DIAGNOSIS — Z794 Long term (current) use of insulin: Secondary | ICD-10-CM

## 2022-03-29 DIAGNOSIS — Z89439 Acquired absence of unspecified foot: Secondary | ICD-10-CM | POA: Diagnosis not present

## 2022-03-29 DIAGNOSIS — E1165 Type 2 diabetes mellitus with hyperglycemia: Secondary | ICD-10-CM | POA: Diagnosis present

## 2022-03-29 DIAGNOSIS — M869 Osteomyelitis, unspecified: Secondary | ICD-10-CM

## 2022-03-29 LAB — POCT GLYCOSYLATED HEMOGLOBIN (HGB A1C): Hemoglobin A1C: 9.9 % — AB (ref 4.0–5.6)

## 2022-03-29 LAB — GLUCOSE, CAPILLARY: Glucose-Capillary: 253 mg/dL — ABNORMAL HIGH (ref 70–99)

## 2022-03-29 MED ORDER — INSULIN PEN NEEDLE 31G X 5 MM MISC
1.0000 [IU] | 11 refills | Status: AC
  Filled 2022-03-29: qty 100, 30d supply, fill #0
  Filled 2022-06-07 – 2022-07-02 (×3): qty 100, 30d supply, fill #1
  Filled 2022-09-18: qty 100, 30d supply, fill #2

## 2022-03-29 MED ORDER — EMPAGLIFLOZIN 10 MG PO TABS
10.0000 mg | ORAL_TABLET | Freq: Every day | ORAL | 2 refills | Status: AC
  Filled 2022-03-29: qty 30, 30d supply, fill #0
  Filled 2022-06-07: qty 30, 30d supply, fill #1

## 2022-03-29 MED ORDER — FREESTYLE LIBRE 3 SENSOR MISC
11 refills | Status: AC
  Filled 2022-03-29 – 2022-05-04 (×2): qty 2, 28d supply, fill #0
  Filled 2022-06-07 – 2022-07-02 (×4): qty 2, 28d supply, fill #1
  Filled 2022-09-18: qty 2, 28d supply, fill #2

## 2022-03-29 MED ORDER — INSULIN DEGLUDEC 100 UNIT/ML ~~LOC~~ SOPN
74.0000 [IU] | PEN_INJECTOR | Freq: Every day | SUBCUTANEOUS | 2 refills | Status: DC
  Filled 2022-03-29: qty 9, 12d supply, fill #0

## 2022-03-29 NOTE — Patient Instructions (Addendum)
????? ??? ????? ??? ????? ??? ?????? ??? ?????? ?????? ?????. ?????? ?????:  ??? ??????: ??? ????? ??? ????????? ??????? ??? ????? ?????? 74 ????. ?????? ???????? 10 ???. ????? ?????? ?????? ??? 3 ??? ????????. ??? ????? ??? ???? ?? ??? ????.  ??? ???? ?? ????????? ???????:  ????? ???????      ????????? ???????      POC Hbg A1C   ??? ???? ??????? ???????/???? ??????? ???????:  ????? ??????? ???????: ????? ??????? ???? ??? ?????? ??? ??????  ??????? ??????? (??????) 100 ????/?? ??? ????????  ????????????? (????????) 25 ??? ?????  ???? ??? ????????? 31 ????  5 ??? ????? ????? MISC    ???? ?????? ??????? ???????: ??? ??? ???? ?????? ???????  ??????? ?????? (???????) 100 ????/?? ??? FlexTouch ???: ???? 74 ???? ?? ????? ??? ?????. ?????????: 9 ?? ????? ?????: 2  ????????????? (????????) 10 ??? ????? ???: ????? ????? ?????? (?????? 10 ???) ?? ???? ???? ?????? ??? ???????. ??????: 30 ??? ????? ?????: 2  ???? ??????? ???? ????? ?? ???? ??????? (FREESTYLE LIBRE 3 SENSOR) MISC ???: ?? ???????? ?????? ??? ????? ?? 14 ?????. ?????? ???? ???????? ???? ????? ?????????: 2 ??? ????? ????? ?????: 11  ???? ??? ????????? 31 ????  5 ??? ?????? ???: ?????? ??? ????????? ?????????: 100 ??? ????? ????? ?????: 11    ????????: ??? ????   ???? ????? ??? ????? ?? ????? ???????. ???? ??????? ???????? ??? ????? [phone] ??? ??? ???? ?? ????? ?? ?????. ???? ??? ??????? ?? ?? ??????? ??? ?????? ?? ?????? 9 ?????? ??? 4 ?????? ???? ???? ??? ???? ??? ???? ?????? ???? ???? ???????. ??? ??? ??? ??? ????? ????? ?? ???? ????? ???????? ????? ???? ???????? ??????? ????? ???? ???? ??????? ?????? ??? ?????. ??? ??? ????? ??? ????? ??????? ???? ????? ???????? ??? ????? ???? ???? ?????? ??? ?????.  ???? ????? ?? ?? ??????. ????? ?? ??????!  ????? ?????? ????? ?? ???? ???? ??? ???? ???? ??????? Thank you, Mr.Fremon Stoney for allowing [place] to provide your care today. Today we discussed :  Diabetes: We are  switching to insulin tresiba once daily 74u. We will continue jardiance '10mg'$ . We will increase trulicity to 3 mg weekly. We will see you back in 1 month.  I have ordered the following labs for you:  Lab Orders         Glucose, capillary         POC Hbg A1C       I have ordered the following medication/changed the following medications:   Stop the following medications: Medications Discontinued During This Encounter  Medication Reason   insulin glargine (LANTUS) 100 UNIT/ML Solostar Pen    empagliflozin (JARDIANCE) 25 MG TABS tablet    Insulin Pen Needle 31G X 5 MM MISC Reorder     Start the following medications: Meds ordered this encounter  Medications   insulin degludec (TRESIBA) 100 UNIT/ML FlexTouch Pen    Sig: Inject 74 Units into the skin at bedtime.    Dispense:  9 mL    Refill:  2   empagliflozin (JARDIANCE) 10 MG TABS tablet    Sig: Take 1 tablet (10 mg total) by mouth daily before breakfast.    Dispense:  30 tablet    Refill:  2   Continuous Blood Gluc Sensor (FREESTYLE LIBRE 3 SENSOR) MISC    Sig: Place 1 sensor on the skin every 14 days. Use to check glucose continuously    Dispense:  2 each  Refill:  11   Insulin Pen Needle 31G X 5 MM MISC    Sig: Use as directed    Dispense:  100 each    Refill:  11     Follow up:  1 month     We look forward to seeing you next time. Please call our clinic at [phone] if you have any questions or concerns. The best time to call is Monday-Friday from 9am-4pm, but there is someone available 24/7. If after hours or the weekend, call the main hospital number and ask for the Internal Medicine Resident On-Call. If you need medication refills, please notify your pharmacy one week in advance and they will send [place] a request.   Thank you for trusting me with your care. Wishing you the best!   Iona Coach, MD [place]

## 2022-03-29 NOTE — Progress Notes (Deleted)
Type 2 diabetes mellitus with hyperglycemia ([place])       Most recent A1c 01/02/2023 10.8%. Patient presents today for follow up of diabetes and med titration. Per accucheck review patient is missing many days of checking his BG. Also rarely checking fasting AM level. There are some days where his glc is mainly <150, others where it is primarily 200-300. Wife says he misses his meal time insulin at times. Lowest BG was 99. Given inconsisten BG checks at home and minimal fasting AM data will not titrate insulin today. Having [name] place CGM for 2 weeks to better understand basal and meal time insulin needs. For now increasing jardiance and trulicity. Optho referral has been made. -Levemir 37u BID to lantus 37u BID -Jardiance '10mg'$  to '25mg'$  -Trulicity 1.'5mg'$  weekly to 3.0 mg weekly -Continue lispro 5u TID with meals -continue metformin 1000 mg BID- A1c at next visit -professional CGM placed per [name].     CKD (chronic kidney disease) stage 3, GFR 30-59 ml/min (HCC)       Per charting appears patient has been followed for AKI. 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 Of not microalbumin of 146 12/2021 consistent with moderate albuminuria. On olmesartan 20 and increased empaglaflozin to '25mg'$  today. Finerenone not indicated at this time. 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 In October 2023, evaluation at [facility] showed osteomyelitis of the residual 1-4 metatarsals. He underwent TMA revision 11/01/21 and was treated with 6-week course of IV daptomycin and oral ciprofloxacin. Intraoperative cultures were MRSA positive. He had follow-up with podiatry and ID and completed IV abx. He was seen in the [facility] 01/26/22 for worsening pain and drainage from his foot wound. ESR and CRP 58 and 2.5, respectively, at that time (129/94.7  10/29/21). He was seen in f/u in [facility] 02/01/22 and was prescribed doxycycline due to concern for persistent/recurrent osteomyelitis. MRI of the foot was ordered at that time. MRI was not able to be completed until 2/16 with findings c/f OM in the 2-4 metatarsals. [name] has spoken with patient's podiatrist, [name], who does not feel there is any further surgical intervention to offer from a podiatry standpoint. I discussed this case with ID, [name], and will continue suppressive doxycycline bid for now. We will plan to trend inflammatory markers at next visit and will send referral to [name] to follow. [name] will plan to speak with [name] regarding the imaging findings and potential treatment options, including potential referral to orthopedic surgery, at upcoming visit.

## 2022-03-30 LAB — SEDIMENTATION RATE: Sed Rate: 44 mm/hr — ABNORMAL HIGH (ref 0–30)

## 2022-03-30 LAB — C-REACTIVE PROTEIN: CRP: <1 mg/L (ref 0–10)

## 2022-04-02 ENCOUNTER — Ambulatory Visit (HOSPITAL_BASED_OUTPATIENT_CLINIC_OR_DEPARTMENT_OTHER): Payer: Medicaid Other | Admitting: General Surgery

## 2022-04-02 NOTE — Assessment & Plan Note (Signed)
Checked ESR/CRP which will be trended. CRP <1 and ESR minimally elevated at 44. Patient has been referred to Dr. Johnnye Sima. Will continue doxycycline. Will continue with podiatry and wound care.

## 2022-04-02 NOTE — Progress Notes (Signed)
Established Patient Office Visit  Subjective   Patient ID: [name], male    DOB: Mar 15, 1965  Age: 57 y.o. MRN: [id number]  Chief Complaint  Patient presents with   Follow-up    2wk follow up    Diabetes    [name] is a 57 y/o male with a pmh outlined below. Please see A&P for HPI information.  Diabetes      Review of Systems  All other systems reviewed and are negative.     Objective:     BP 138/82 (BP Location: Right Arm, Patient Position: Sitting, Cuff Size: Normal)   Pulse 80   Temp 97.9 F (36.6 C) (Oral)   Wt 217 lb 12.8 oz (98.8 kg)   SpO2 99%   BMI 27.96 kg/m    Physical Exam Constitutional:      General: He is not in acute distress.    Appearance: Normal appearance. He is obese.  Eyes:     General: No scleral icterus.    Conjunctiva/sclera: Conjunctivae normal.  Cardiovascular:     Rate and Rhythm: Normal rate and regular rhythm.     Pulses: Normal pulses.     Heart sounds: Normal heart sounds. No murmur heard.    No gallop.  Pulmonary:     Effort: Pulmonary effort is normal. No respiratory distress.     Breath sounds: Normal breath sounds. No wheezing or rales.  Musculoskeletal:     Right lower leg: No edema.     Left lower leg: No edema.     Comments: R foot s/p transmetatarsal amputation in surgical boot with foot wrap and bandaging in place  Skin:    General: Skin is warm and dry.     Capillary Refill: Capillary refill takes less than 2 seconds.  Neurological:     Mental Status: He is alert.      Results for orders placed or performed in visit on 03/29/22  Glucose, capillary  Result Value Ref Range   Glucose-Capillary 253 (H) 70 - 99 mg/dL  CRP (C-Reactive Protein)  Result Value Ref Range   CRP <1 0 - 10 mg/L  Sed Rate (ESR)  Result Value Ref Range   Sed Rate 44 (H) 0 - 30 mm/hr  POC Hbg A1C  Result Value Ref Range   Hemoglobin A1C 9.9 (A) 4.0 - 5.6 %   HbA1c POC (<> result, manual entry)     HbA1c, POC (prediabetic  range)     HbA1c, POC (controlled diabetic range)        The 10-year ASCVD risk score (Arnett DK, et al., 2019) is: 22.8%    Assessment & Plan:   Problem List Items Addressed This Visit       Endocrine   Type 2 diabetes mellitus with hyperglycemia (HCC) - Primary    A1c today 10.8 to 9.9.Patient following up after professional CGM. He got a foot MRI shortly after placement and no data was collected. He did still perform some finger stick blood glucose. Only had 2 days of AM blood glucose which showed 124 and 75. Mos sugar was 120-140. There were a few high values 200-300s. Given minimal AM data and a recent AM glucose of 75 will not increase insulin. Will transition long acting insulin to tresiba for better control and less variability if he doesn't give insulin at the same time daily. Holding off on jardiance increase given that patient did not pick up increased dose and given that he has yet to  start his increased trulicity dose.  [name] 74u daily -lispro 5u TID -jardiance '10mg'$  -trulicity '3mg'$  weekly -freestyle libre 3 ordered -f/u 1 month for insulin titration and ensure CGM obtained       Relevant Medications   insulin degludec (TRESIBA) 100 UNIT/ML FlexTouch Pen   empagliflozin (JARDIANCE) 10 MG TABS tablet   Continuous Blood Gluc Sensor (FREESTYLE LIBRE 3 SENSOR) MISC   Insulin Pen Needle 31G X 5 MM MISC   Other Relevant Orders   POC Hbg A1C (Completed)     Other   History of transmetatarsal amputation of foot ([place])    Checked ESR/CRP which will be trended. CRP <1 and ESR minimally elevated at 44. Patient has been referred to Dr. Johnnye Sima. Will continue doxycycline. Will continue with podiatry and wound care.      Other Visit Diagnoses     Uncontrolled type 2 diabetes mellitus with hyperglycemia (HCC)       Relevant Medications   insulin degludec (TRESIBA) 100 UNIT/ML FlexTouch Pen   empagliflozin (JARDIANCE) 10 MG TABS tablet   Insulin Pen Needle 31G X 5 MM MISC    Osteomyelitis of right foot, unspecified type ([place])           Return in about 4 weeks (around 04/26/2022).    Iona Coach, MD

## 2022-04-02 NOTE — Assessment & Plan Note (Addendum)
A1c today 10.8 to 9.9.Patient following up after professional CGM. He got a foot MRI shortly after placement and no data was collected. He did still perform some finger stick blood glucose. Only had 2 days of AM blood glucose which showed 124 and 75. Mos sugar was 120-140. There were a few high values 200-300s. Given minimal AM data and a recent AM glucose of 75 will not increase insulin. Will transition long acting insulin to tresiba for better control and less variability if he doesn't give insulin at the same time daily. Holding off on jardiance increase given that patient did not pick up increased dose and given that he has yet to start his increased trulicity dose.  Marland KitchenTyler Aas 74u daily -lispro 5u TID -jardiance '10mg'$  -trulicity '3mg'$  weekly -freestyle libre 3 ordered -f/u 1 month for insulin titration and ensure CGM obtained

## 2022-04-03 ENCOUNTER — Telehealth: Payer: Self-pay | Admitting: Dietician

## 2022-04-03 NOTE — Telephone Encounter (Signed)
Informed by lab that before patient left the office after his recent office visit, he was asking about getting another CGM sensor out on. During his visit, his son  had set up the [place] 3 app on his smartphone and was to pick up sensors at their pharmacy and help his father start to use the personal CGM rather than the professional CGM. Left message to follow up to be sure this happened and also that patient was able to get the Tresiba insulin.

## 2022-04-03 NOTE — Progress Notes (Signed)
ESR minimally elevated and CRP wnl. Suspect MRI findings of R foot s/p R transmetatarsal amputation are reactive and not osteo. Will continue with wound care and podiatry. Can continue doxycycline for now. To see Dr. Johnnye Sima who will trend ESR/CRP and determin long term need for antibiotics.

## 2022-04-04 NOTE — Progress Notes (Signed)
Internal Medicine Clinic Attending  Case discussed with the resident at the time of the visit.  We reviewed the resident's history and exam and pertinent patient test results.  I agree with the assessment, diagnosis, and plan of care documented in the resident's note.  

## 2022-04-06 ENCOUNTER — Other Ambulatory Visit (HOSPITAL_COMMUNITY): Payer: Self-pay

## 2022-04-17 ENCOUNTER — Other Ambulatory Visit (HOSPITAL_COMMUNITY): Payer: Self-pay

## 2022-04-17 ENCOUNTER — Telehealth: Payer: Self-pay

## 2022-04-17 ENCOUNTER — Ambulatory Visit: Payer: Medicaid Other | Admitting: Infectious Diseases

## 2022-04-17 ENCOUNTER — Encounter: Payer: Self-pay | Admitting: Infectious Diseases

## 2022-04-17 ENCOUNTER — Ambulatory Visit (INDEPENDENT_AMBULATORY_CARE_PROVIDER_SITE_OTHER): Payer: Medicaid Other | Admitting: Dietician

## 2022-04-17 ENCOUNTER — Other Ambulatory Visit: Payer: Self-pay | Admitting: Dietician

## 2022-04-17 VITALS — BP 184/87 | HR 74 | Temp 98.2°F | Ht 74.0 in | Wt 219.1 lb

## 2022-04-17 DIAGNOSIS — Z794 Long term (current) use of insulin: Secondary | ICD-10-CM

## 2022-04-17 DIAGNOSIS — M86171 Other acute osteomyelitis, right ankle and foot: Secondary | ICD-10-CM | POA: Diagnosis not present

## 2022-04-17 DIAGNOSIS — E1165 Type 2 diabetes mellitus with hyperglycemia: Secondary | ICD-10-CM

## 2022-04-17 DIAGNOSIS — M869 Osteomyelitis, unspecified: Secondary | ICD-10-CM

## 2022-04-17 DIAGNOSIS — R220 Localized swelling, mass and lump, head: Secondary | ICD-10-CM | POA: Diagnosis not present

## 2022-04-17 DIAGNOSIS — E1169 Type 2 diabetes mellitus with other specified complication: Secondary | ICD-10-CM | POA: Diagnosis not present

## 2022-04-17 DIAGNOSIS — I10 Essential (primary) hypertension: Secondary | ICD-10-CM

## 2022-04-17 MED ORDER — DOXYCYCLINE HYCLATE 100 MG PO TABS
100.0000 mg | ORAL_TABLET | Freq: Two times a day (BID) | ORAL | 0 refills | Status: DC
  Filled 2022-04-17: qty 60, 30d supply, fill #0

## 2022-04-17 MED ORDER — OLMESARTAN MEDOXOMIL 20 MG PO TABS
20.0000 mg | ORAL_TABLET | Freq: Every day | ORAL | 3 refills | Status: DC
  Filled 2022-04-17: qty 30, 30d supply, fill #0
  Filled 2022-06-07: qty 30, 30d supply, fill #1

## 2022-04-17 MED ORDER — INSULIN DEGLUDEC 100 UNIT/ML ~~LOC~~ SOPN
74.0000 [IU] | PEN_INJECTOR | Freq: Every day | SUBCUTANEOUS | 2 refills | Status: DC
  Filled 2022-04-17: qty 21, 28d supply, fill #0
  Filled 2022-06-07 – 2022-07-02 (×5): qty 21, 28d supply, fill #1
  Filled 2022-09-19: qty 21, 28d supply, fill #2

## 2022-04-17 NOTE — Telephone Encounter (Signed)
Prior Authorization for patient [facility] Libre 3 sensor) came through on cover my meds was submitted with last office notes and labs awaiting approval or denial

## 2022-04-17 NOTE — Assessment & Plan Note (Addendum)
Will continue his doxy Defer ESR and CRP as we know he has osteo on prev MRI Will see him back in 60 days and repeat his MRI at that time Have asked him to call back if worsening bleeding or pustulent d/c.  Advised them not to soak foot.  See with interpreter.

## 2022-04-17 NOTE — Patient Instructions (Addendum)
Thank you for your visit and bringing your meter today.   Please pick up the Freestyle [place] 3 sensors at [facility] after they are approved.    You should be taking the following diabetes medicines:  1- Trulicity (is not insulin) every MOnday 2- Tresiba insulin 74 units once a day 3- Humalog insulin 5 units before meals  4- Metformin two times a day 1000 mg 5- Jardiance 10 mg once daily    Rulon Abdalla [phone]

## 2022-04-17 NOTE — Assessment & Plan Note (Addendum)
He is currently fasting for [place].  Spouse is worried about this.  FSG this am 130.  Will get him in with Dm educator, he feels like his glc is dropping.  Will get ophtho.

## 2022-04-17 NOTE — Progress Notes (Signed)
Subjective:    Patient ID: [name], male  DOB: [birth year], 57 y.o.        MRN: [id number]   HPI 57 yo [place] M (been in [place] since 09-2004) with hx of DM2 (since (09-2004), and previous transmetatarsal amputation of the right foot due to osteomyelitis in 2020 due to diabetic foot ulcers.  In October 2023, evaluation at [facility] showed osteomyelitis of the residual 1-4 metatarsals. He underwent TMA revision 11/01/21 and was treated with 6-week course of IV daptomycin and oral ciprofloxacin. Intraoperative cultures were MRSA positive. He had follow-up with podiatry and ID and completed IV abx.  He was seen in the [facility] 01/26/22 for worsening pain and drainage from his foot wound. ESR and CRP 58 and 2.5, respectively. He was seen in f/u in [facility] 02/01/22 and was prescribed doxycycline due to concern for persistent/recurrent osteomyelitis.  03-15-22 [id number] changes of transmetatarsal amputation. Marrow signal abnormality in the residual second, third, and fourth metatarsals concerning for osteomyelitis. Stump cellulitis with broad area of soft tissue non-enhancement along the lateral plantar stump measuring 4.8 x 3.6 x 4.8 cm, which could represent devitalized soft tissue. No organized fluid collection to suggest soft tissue abscess.  03-29-22 ESR 44   CRP < 1 He was given doxy.  Was seen by podiatry today and wound cleaned.  States that April 2023 his wounds were completely healed.  Currently he denies pain, erythema, and has had wound d/c since it was debrided.   HgbA1C 9.9%  Health Maintenance  Topic Date Due   COVID-19 Vaccine (1) Never done   Hepatitis C Screening  Never done   Zoster Vaccines- Shingrix (1 of 2) Never done   OPHTHALMOLOGY EXAM  03/03/2020   COLONOSCOPY (Pts 45-60yrs Insurance coverage will need to be confirmed)  11/09/2021   FOOT EXAM  05/03/2022   HEMOGLOBIN A1C  06/27/2022   Diabetic kidney evaluation - Urine ACR  01/02/2023   LIPID PANEL  01/02/2023   Diabetic  kidney evaluation - eGFR measurement  02/09/2023   DTaP/Tdap/Td (3 - Td or Tdap) 01/02/2032   INFLUENZA VACCINE  Completed   HIV Screening  Completed   HPV VACCINES  Aged Out      Review of Systems  Constitutional:  Negative for chills and fever.  Eyes:  Negative for blurred vision.  Cardiovascular:  Negative for chest pain.  Gastrointestinal:  Negative for constipation and diarrhea.  Genitourinary:  Negative for dysuria.  Neurological:  Negative for dizziness, sensory change and headaches.    Please see HPI. All other systems reviewed and negative.     Objective:  Physical Exam Vitals reviewed.  Constitutional:      Appearance: Normal appearance. He is normal weight.  HENT:     Mouth/Throat:     Mouth: Mucous membranes are moist.     Pharynx: No oropharyngeal exudate.  Eyes:     Extraocular Movements: Extraocular movements intact.     Pupils: Pupils are equal, round, and reactive to light.  Cardiovascular:     Rate and Rhythm: Normal rate and regular rhythm.  Pulmonary:     Effort: Pulmonary effort is normal.  Abdominal:     General: Bowel sounds are normal. There is no distension.     Palpations: Abdomen is soft.     Tenderness: There is no abdominal tenderness.  Musculoskeletal:     Right lower leg: Edema present.     Left lower leg: Edema present.  Neurological:     General: No  focal deficit present.     Mental Status: He is alert.  Psychiatric:        Mood and Affect: Mood normal.    Small amount of bleeding. No d/c.  No odor. No tenderness. +dorsalis pedis pulse.      Assessment & Plan:

## 2022-04-17 NOTE — Assessment & Plan Note (Addendum)
Mild facial swelling, no tonsilar swelling. No wheezing or airway obstruction.  Doubt this is doxy as he has been on prior.  ARB? I am not sure he is taking this.  Will f/u.

## 2022-04-17 NOTE — Assessment & Plan Note (Addendum)
Did not see his ARB when I looked throughhis meds.  Will refill repeat elevated and get f/u in IMTS.

## 2022-04-17 NOTE — Progress Notes (Signed)
Diabetes Self-Management Education  Visit Type: Annual Follow-Up  Appt. Start Time: 1415 Appt. End Time: [id number]  04/17/2022  Mr. Avishai Kapler, identified by name and date of birth, is a 57 y.o. male with a diagnosis of Diabetes:  .   ASSESSMENT  There were no vitals taken for this visit. There is no height or weight on file to calculate BMI.   Diabetes Self-Management Education - 04/17/22 1700       Visit Information   Visit Type Annual Follow-Up      Health Coping   How would you rate your overall health? Fair   upset about his foot     Pre-Education Assessment   Patient understands the diabetes disease and treatment process. Comprehends key points    Patient understands incorporating nutritional management into lifestyle. Comprehends key points    Patient undertands incorporating physical activity into lifestyle. Comprehends key points    Patient understands using medications safely. Needs Review    Patient understands monitoring blood glucose, interpreting and using results Needs Review    Patient understands prevention, detection, and treatment of acute complications. Needs Review    Patient understands prevention, detection, and treatment of chronic complications. Compreheands key points    Patient understands how to develop strategies to address psychosocial issues. Comprehends key points    Patient understands how to develop strategies to promote health/change behavior. Comprehends key points      Complications   Last HgB A1C per patient/outside source 9.9 %    How often do you check your blood sugar? 1-2 times/day    Fasting Blood glucose range (mg/dL) 70-129;130-179;180-200    Postprandial Blood glucose range (mg/dL) <70;>200;180-200    Number of hypoglycemic episodes per month 4    Can you tell when your blood sugar is low? Yes    What do you do if your blood sugar is low? eats    Number of hyperglycemic episodes ( >200mg /dL): Weekly    Can you tell when your blood  sugar is high? Yes    What do you do if your blood sugar is high? drinks water    Have you had a dilated eye exam in the past 12 months? No    Have you had a dental exam in the past 12 months? No    Are you checking your feet? Yes    How many days per week are you checking your feet? 7      Dietary Intake   Breakfast plan to do at follow up      Activity / Exercise   Activity / Exercise Type ADL's   supposed to stay off his foot     Patient Education   Previous Diabetes Education Yes (please comment)    Medications Reviewed patients medication for diabetes, action, purpose, timing of dose and side effects.    Monitoring Taught/evaluated CGM (comment);Taught/discussed recording of test results and interpretation of SMBG.      Individualized Goals (developed by patient)   Medications take my medication as prescribed    Monitoring  Consistenly use CGM      Outcomes   Expected Outcomes Demonstrated interest in learning but significant barriers to change    Future DMSE 2 wks    Program Status Not Completed      Subsequent Visit   Since your last visit have you continued or begun to take your medications as prescribed? No   there was a lot of confusuion about his medicines. he has run  out of jardiabce, was unsure how much  he has been taking of [place]. is fasting for Ramadan so only taking Humalog 5 units at 5 AM and 730 PM.   Since your last visit have you had your blood pressure checked? Yes    Is your most recent blood pressure lower, unchanged, or higher since your last visit? Higher    Since your last visit have you experienced any weight changes? Gain    Weight Gain (lbs) 9    Since your last visit, are you checking your blood glucose at least once a day? Yes             Individualized Plan for Diabetes Self-Management Training:   Learning Objective:  Patient will have a greater understanding of diabetes self-management. Patient education plan is to attend individual  and/or group sessions per assessed needs and concerns.   Plan:   Patient Instructions  Thank you for your visit and bringing your meter today.   Please pick up the Freestyle [place] 3 sensors at [facility] after they are approved.    You should be taking the following diabetes medicines:  1- Trulicity (is not insulin) every MOnday 2- Tresiba insulin 74 units once a day 3- Humalog insulin 5 units before meals  4- Metformin two times a day 1000 mg 5- Jardiance 10 mg once daily    [name] [phone]   Expected Outcomes:  Demonstrated interest in learning but significant barriers to change Education material provided: Diabetes Resources If problems or questions, patient to contact team via:  Phone Future DSME appointment: 2 wks Debera Lat, RD 04/17/2022 5:10 PM.

## 2022-04-17 NOTE — Telephone Encounter (Signed)
Patient asked for a refill on his [place]

## 2022-04-18 NOTE — Telephone Encounter (Addendum)
Decision:Denied Appeal has been faxed to patients insurance [phone] along with last office visit and labs awaiting approval or denial

## 2022-04-24 ENCOUNTER — Ambulatory Visit: Payer: Medicaid Other

## 2022-04-24 ENCOUNTER — Encounter (HOSPITAL_BASED_OUTPATIENT_CLINIC_OR_DEPARTMENT_OTHER): Payer: Medicaid Other | Attending: General Surgery | Admitting: General Surgery

## 2022-04-24 ENCOUNTER — Other Ambulatory Visit: Payer: Self-pay

## 2022-04-24 VITALS — BP 129/75 | HR 88 | Temp 98.6°F | Wt 221.3 lb

## 2022-04-24 DIAGNOSIS — R220 Localized swelling, mass and lump, head: Secondary | ICD-10-CM

## 2022-04-24 DIAGNOSIS — I129 Hypertensive chronic kidney disease with stage 1 through stage 4 chronic kidney disease, or unspecified chronic kidney disease: Secondary | ICD-10-CM | POA: Diagnosis not present

## 2022-04-24 DIAGNOSIS — Z89439 Acquired absence of unspecified foot: Secondary | ICD-10-CM | POA: Insufficient documentation

## 2022-04-24 DIAGNOSIS — E1140 Type 2 diabetes mellitus with diabetic neuropathy, unspecified: Secondary | ICD-10-CM | POA: Diagnosis not present

## 2022-04-24 DIAGNOSIS — Z794 Long term (current) use of insulin: Secondary | ICD-10-CM | POA: Diagnosis not present

## 2022-04-24 DIAGNOSIS — E1165 Type 2 diabetes mellitus with hyperglycemia: Secondary | ICD-10-CM

## 2022-04-24 DIAGNOSIS — L97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 without evidence of necrosis: Secondary | ICD-10-CM | POA: Diagnosis not present

## 2022-04-24 DIAGNOSIS — E11621 Type 2 diabetes mellitus with foot ulcer: Secondary | ICD-10-CM | POA: Insufficient documentation

## 2022-04-24 DIAGNOSIS — L97522 Non-pressure chronic ulcer of other part of left foot with fat layer exposed: Secondary | ICD-10-CM | POA: Insufficient documentation

## 2022-04-24 DIAGNOSIS — E1122 Type 2 diabetes mellitus with diabetic chronic kidney disease: Secondary | ICD-10-CM | POA: Insufficient documentation

## 2022-04-24 DIAGNOSIS — N183 Chronic kidney disease, stage 3 unspecified: Secondary | ICD-10-CM | POA: Insufficient documentation

## 2022-04-24 NOTE — Progress Notes (Addendum)
[name], [name] ([id number]) [id number] Page 1 of 9 Visit Report for 04/24/2022 Arrival Information Details Patient Name: Date of Service: [name] [place] R 04/24/2022 2:15 PM Medical Record Number: [id number] Patient Account Number: [id number] Date of Birth/Sex: Treating RN: [birth year] (57 y.o. M) Primary Care Livana Yerian: Nani Gasser Other Clinician: Referring Emmaleah Meroney: Treating Jilberto Vanderwall/Extender: [place] in Treatment: 11 Visit Information History Since Last Visit All ordered tests and consults were completed: No Patient Arrived: Ambulatory Added or deleted any medications: No Arrival Time: 13:51 Any new allergies or adverse reactions: No Accompanied By: wife Had a fall or experienced change in No Transfer Assistance: None activities of daily living that may affect Patient Identification Verified: Yes risk of falls: Secondary Verification Process Completed: Yes Signs or symptoms of abuse/neglect since last visito No Patient Requires Transmission-Based Precautions: No Hospitalized since last visit: No Implantable device outside of the clinic excluding No cellular tissue based products placed in the center since last visit: Pain Present Now: No Electronic Signature(s) Signed: 04/24/2022 3:23:50 PM By: Worthy Rancher Entered By: Worthy Rancher on 04/24/2022 13:58:33 -------------------------------------------------------------------------------- Encounter Discharge Information Details Patient Name: Date of Service: Samuel Pigg MA R 04/24/2022 2:15 PM Medical Record Number: [id number] Patient Account Number: [id number] Date of Birth/Sex: Treating RN: 05/28/1965 (57 y.o. Collene Gobble Primary Care Kamare Caspers: Nani Gasser Other Clinician: Referring Ranulfo Kall: Treating Sheryl Saintil/Extender: [place] in Treatment: 11 Encounter Discharge Information Items Post Procedure Vitals Discharge Condition:  Stable Temperature (F): 97.8 Ambulatory Status: Ambulatory Pulse (bpm): 90 Discharge Destination: Home Respiratory Rate (breaths/min): 20 Transportation: Private Auto Blood Pressure (mmHg): 141/82 Accompanied By: spouse Schedule Follow-up Appointment: Yes Clinical Summary of Care: Patient Declined Electronic Signature(s) Signed: 04/24/2022 3:46:01 PM By: Dellie Catholic RN Entered By: Dellie Catholic on 04/24/2022 15:44:17 -------------------------------------------------------------------------------- Lower Extremity Assessment Details Patient Name: Date of Service: Samuel Pigg MA R 04/24/2022 2:15 PM Medical Record Number: [id number] Patient Account Number: [id number] Date of Birth/Sex: Treating RN: July 07, 1965 (57 y.o. Collene Gobble Primary Care Carole Doner: Nani Gasser Other Clinician: Referring Sheffield Hawker: Treating Sondra Blixt/Extender: [place] in Treatment: 11 Edema Assessment Assessed: Shirlyn Goltz: No] Patrice Paradise: No] O[LeftDollene Cleveland, [name] [id number] [RightID:[id number] Page 2 of 9] Edema: [Left: No] [Right: No] Calf Left: Right: Point of Measurement: From Medial Instep 36 cm 35 cm Ankle Left: Right: Point of Measurement: From Medial Instep 24 cm 23.5 cm Vascular Assessment Pulses: Dorsalis Pedis Palpable: [Left:Yes] [Right:Yes] Electronic Signature(s) Signed: 04/24/2022 3:46:01 PM By: Dellie Catholic RN Entered By: Dellie Catholic on 04/24/2022 14:29:05 -------------------------------------------------------------------------------- Multi Wound Chart Details Patient Name: Date of Service: Samuel Pigg MA R 04/24/2022 2:15 PM Medical Record Number: [id number] Patient Account Number: [id number] Date of Birth/Sex: Treating RN: 04/18/1965 (57 y.o. M) Primary Care Loralye Loberg: Nani Gasser Other Clinician: Referring Debbrah Sampedro: Treating Dajha Urquilla/Extender: [place] in Treatment: 11 Vital  Signs Height(in): 37 Capillary Blood Glucose(mg/dl): 115 Weight(lbs): 214 Pulse(bpm): 73 Body Mass Index(BMI): 63 Blood Pressure(mmHg): 141/82 Temperature(F): 97.8 Respiratory Rate(breaths/min): 20 [1:Photos:] [N/A:N/A] Left Amputation Site - Transmetatarsal Right Amputation Site - N/A Wound Location: Transmetatarsal Surgical Injury Surgical Injury N/A Wounding Event: Diabetic Wound/Ulcer of the Lower Diabetic Wound/Ulcer of the Lower N/A Primary Etiology: Extremity Extremity Lymphedema, Hypertension, Type II Lymphedema, Hypertension, Type II N/A Comorbid History: Diabetes, Neuropathy Diabetes, Neuropathy 02/01/2022 10/30/2021 N/A Date Acquired: 11 11 N/A Weeks of Treatment: Open Open N/A Wound Status: No No N/A Wound Recurrence: 0.1x0.1x0.1 1x0.2x0.2 N/A Measurements L x W  x D (cm) 0.008 0.157 N/A A (cm) : rea 0.001 0.031 N/A Volume (cm) : 99.50% 98.70% N/A % Reduction in A rea: 99.40% 97.50% N/A % Reduction in Volume: Grade 1 Grade 1 N/A Classification: Small Medium N/A Exudate A mount: Serosanguineous Serosanguineous N/A Exudate Type: red, brown red, brown N/A Exudate Color: Well defined, not attached Thickened N/A Wound Margin: Small (1-33%) Large (67-100%) N/A Granulation A mount: Red Red N/A Granulation Quality: Large (67-100%) Small (1-33%) N/A Necrotic A mount: Eschar Adherent Slough N/A Necrotic Tissue: [name] ([id number]) [id number] Page 3 of 9 Fat Layer (Subcutaneous Tissue): Yes Fat Layer (Subcutaneous Tissue): Yes N/A Exposed Structures: Fascia: No Fascia: No Tendon: No Tendon: No Muscle: No Muscle: No Joint: No Joint: No Bone: No Bone: No Small (1-33%) Small (1-33%) N/A Epithelialization: Debridement - Selective/Open Wound Debridement - Selective/Open Wound N/A Debridement: Pre-procedure Verification/Time Out 14:34 14:34 N/A Taken: Lidocaine 4% T opical Solution Lidocaine 4% T opical Solution  N/A Pain Control: Callus, Slough Callus, Slough N/A Tissue Debrided: Non-Viable Tissue Non-Viable Tissue N/A Level: 0.01 0.2 N/A Debridement A (sq cm): rea Curette Curette N/A Instrument: Minimum Minimum N/A Bleeding: Pressure Pressure N/A Hemostasis A chieved: 0 0 N/A Procedural Pain: 0 0 N/A Post Procedural Pain: Procedure was tolerated well Procedure was tolerated well N/A Debridement Treatment Response: 0.1x0.1x0.1 1x0.2x0.2 N/A Post Debridement Measurements L x W x D (cm) 0.001 0.031 N/A Post Debridement Volume: (cm) Callus: Yes Callus: Yes N/A Periwound Skin Texture: Dry/Scaly: Yes Maceration: Yes N/A Periwound Skin Moisture: Maceration: No Dry/Scaly: No No Abnormalities Noted No Abnormalities Noted N/A Periwound Skin Color: No Abnormality No Abnormality N/A Temperature: Yes N/A N/A Tenderness on Palpation: Debridement Debridement N/A Procedures Performed: Treatment Notes Wound #1 (Amputation Site - Transmetatarsal) Wound Laterality: Left Cleanser Soap and Water Discharge Instruction: May shower and wash wound with dial antibacterial soap and water prior to dressing change. Peri-Wound Care Sween Lotion (Moisturizing lotion) Discharge Instruction: Apply moisturizing lotion as directed Topical Primary Dressing Sorbalgon AG Dressing, 4x4 (in/in) Discharge Instruction: Apply to wound bed as instructed Secondary Dressing Optifoam Non-Adhesive Dressing, 4x4 in Discharge Instruction: Apply over primary dressing cut to make foam donut Woven Gauze Sponges 2x2 in Discharge Instruction: Apply over primary dressing as directed. Secured With Elastic Bandage 4 inch (ACE bandage) Discharge Instruction: Secure with ACE bandage as directed. Kerlix Roll Sterile, 4.5x3.1 (in/yd) Discharge Instruction: Secure with Kerlix as directed. 31M Medipore H Soft Cloth Surgical T ape, 4 x 10 (in/yd) Discharge Instruction: Secure with tape as directed. Compression  Wrap Compression Stockings Add-Ons Wound #2 (Amputation Site - Transmetatarsal) Wound Laterality: Right Cleanser Soap and Water Discharge Instruction: May shower and wash wound with dial antibacterial soap and water prior to dressing change. Peri-Wound Care Sween Lotion (Moisturizing lotion) Discharge Instruction: Apply moisturizing lotion as directed [name], [name] ([id number]) [id number] Page 4 of 9 Topical Primary Dressing Sorbalgon AG Dressing, 4x4 (in/in) Discharge Instruction: Apply to wound bed as instructed Secondary Dressing Optifoam Non-Adhesive Dressing, 4x4 in Discharge Instruction: Apply over primary dressing cut to make foam donut Woven Gauze Sponges 2x2 in Discharge Instruction: Apply over primary dressing as directed. Secured With Elastic Bandage 4 inch (ACE bandage) Discharge Instruction: Secure with ACE bandage as directed. Kerlix Roll Sterile, 4.5x3.1 (in/yd) Discharge Instruction: Secure with Kerlix as directed. 31M Medipore H Soft Cloth Surgical T ape, 4 x 10 (in/yd) Discharge Instruction: Secure with tape as directed. Compression Wrap Compression Stockings Add-Ons Electronic Signature(s) Signed: 04/24/2022 3:49:09 PM By: Fredirick Maudlin MD FACS Entered By:  Fredirick Maudlin on 04/24/2022 15:49:09 -------------------------------------------------------------------------------- Multi-Disciplinary Care Plan Details Patient Name: Date of Service: Samuel Pigg MA R 04/24/2022 2:15 PM Medical Record Number: [id number] Patient Account Number: [id number] Date of Birth/Sex: Treating RN: 10-Dec-1965 (57 y.o. Collene Gobble Primary Care Dewitt Judice: Nani Gasser Other Clinician: Referring Jahiem Franzoni: Treating Inaaya Vellucci/Extender: [place] in Treatment: [place] reviewed with physician Active Inactive Nutrition Nursing Diagnoses: Impaired glucose control: actual or  potential Goals: Patient/caregiver verbalizes understanding of need to maintain therapeutic glucose control per primary care physician Date Initiated: 02/02/2022 Target Resolution Date: 06/27/2022 Goal Status: Active Interventions: Assess patient nutrition upon admission and as needed per policy Provide education on elevated blood sugars and impact on wound healing Treatment Activities: Dietary management education, guidance and counseling : 02/02/2022 Notes: Wound/Skin Impairment Nursing Diagnoses: Impaired tissue integrity GoalsKRESTON, [name] ([id number]) [id number] Page 5 of 9 Patient/caregiver will verbalize understanding of skin care regimen Date Initiated: 03/06/2022 Target Resolution Date: 06/27/2022 Goal Status: Active Ulcer/skin breakdown will have a volume reduction of 30% by week 4 Date Initiated: 02/02/2022 Target Resolution Date: 06/27/2022 Goal Status: Active Interventions: Assess ulceration(s) every visit Provide education on ulcer and skin care Treatment Activities: Patient referred to home care : 02/02/2022 Skin care regimen initiated : 02/02/2022 Notes: Electronic Signature(s) Signed: 04/24/2022 3:46:01 PM By: Dellie Catholic RN Entered By: Dellie Catholic on 04/24/2022 14:42:49 -------------------------------------------------------------------------------- Pain Assessment Details Patient Name: Date of Service: Samuel Pigg MA R 04/24/2022 2:15 PM Medical Record Number: [id number] Patient Account Number: [id number] Date of Birth/Sex: Treating RN: July 17, 1965 (57 y.o. M) Primary Care Yentl Verge: Nani Gasser Other Clinician: Referring Kymberli Wiegand: Treating Coal Nearhood/Extender: [place] in Treatment: 11 Active Problems Location of Pain Severity and Description of Pain Patient Has Paino No Site Locations Pain Management and Medication Current Pain Management: Electronic Signature(s) Signed: 04/24/2022 3:23:50 PM By:  Worthy Rancher Entered By: Worthy Rancher on 04/24/2022 13:58:48 -------------------------------------------------------------------------------- Patient/Caregiver Education Details Patient Name: Date of Service: Samuel Pigg MA R 3/26/2024andnbsp2:15 PM Medical Record Number: [id number] Patient Account Number: [id number] Date of Birth/Gender: Treating RN: [birth year] (57 y.o. Collene Gobble Primary Care Physician: Nani Gasser Other Clinician: Referring Physician: Treating Physician/Extender: Marcina Millard Van, [name] ([id number]) [id number] Page 6 of 9 Weeks in Treatment: 11 Education Assessment Education Provided To: Patient Education Topics Provided Wound/Skin Impairment: Methods: Explain/Verbal Responses: Return demonstration correctly [employer]) Signed: 04/24/2022 3:46:01 PM By: Dellie Catholic RN Entered By: Dellie Catholic on 04/24/2022 14:43:10 -------------------------------------------------------------------------------- Wound Assessment Details Patient Name: Date of Service: Samuel Pigg MA R 04/24/2022 2:15 PM Medical Record Number: [id number] Patient Account Number: [id number] Date of Birth/Sex: Treating RN: 03-19-1965 (57 y.o. 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 Location: Left Amputation Site - Transmetatarsal Wound Status: Open Wounding Event: Surgical Injury Comorbid Lymphedema, Hypertension, Type II Diabetes, Neuropathy History: Date Acquired: 02/01/2022 Weeks Of Treatment: 11 Clustered Wound: No Photos Wound Measurements Length: (cm) 0.1 Width: (cm) 0.1 Depth: (cm) 0.1 Area: (cm) 0.008 Volume: (cm) 0.001 % Reduction in Area: 99.5% % Reduction in  Volume: 99.4% Epithelialization: Small (1-33%) Tunneling: No Undermining: No Wound Description Classification: Grade 1 Wound Margin: Well defined, not attached Exudate Amount: Small Exudate Type: Serosanguineous Exudate Color: red, brown Foul Odor After Cleansing: No Slough/Fibrino No Wound Bed Granulation Amount: Small (  1-33%) Exposed Structure Granulation Quality: Red Fascia Exposed: No Necrotic Amount: Large (67-100%) Fat Layer (Subcutaneous Tissue) Exposed: Yes Necrotic Quality: Eschar Tendon Exposed: No Muscle Exposed: No [name], [name] ([id number]) [id number] Page 7 of 9 Joint Exposed: No Bone Exposed: No Periwound Skin Texture Texture Color No Abnormalities Noted: No No Abnormalities Noted: Yes Callus: Yes Temperature / Pain Temperature: No Abnormality Moisture No Abnormalities Noted: No Tenderness on Palpation: Yes Dry / Scaly: Yes Maceration: No Treatment Notes Wound #1 (Amputation Site - Transmetatarsal) Wound Laterality: Left Cleanser Soap and Water Discharge Instruction: May shower and wash wound with dial antibacterial soap and water prior to dressing change. Peri-Wound Care Sween Lotion (Moisturizing lotion) Discharge Instruction: Apply moisturizing lotion as directed Topical Primary Dressing Sorbalgon AG Dressing, 4x4 (in/in) Discharge Instruction: Apply to wound bed as instructed Secondary Dressing Optifoam Non-Adhesive Dressing, 4x4 in Discharge Instruction: Apply over primary dressing cut to make foam donut Woven Gauze Sponges 2x2 in Discharge Instruction: Apply over primary dressing as directed. Secured With Elastic Bandage 4 inch (ACE bandage) Discharge Instruction: Secure with ACE bandage as directed. Kerlix Roll Sterile, 4.5x3.1 (in/yd) Discharge Instruction: Secure with Kerlix as directed. 80M Medipore H Soft Cloth Surgical T ape, 4 x 10 (in/yd) Discharge Instruction: Secure with tape as directed. Compression  Wrap Compression Stockings Add-Ons Electronic Signature(s) Signed: 04/24/2022 3:46:01 PM By: Dellie Catholic RN Entered By: Dellie Catholic on 04/24/2022 14:25:45 -------------------------------------------------------------------------------- Wound Assessment Details Patient Name: Date of Service: Samuel Pigg MA R 04/24/2022 2:15 PM Medical Record Number: [id number] Patient Account Number: [id number] Date of Birth/Sex: Treating RN: May 20, 1965 (57 y.o. M) Primary Care Jlen Wintle: Nani Gasser Other Clinician: Referring Michall Noffke: Treating Hawkin Charo/Extender: [place] in Treatment: 11 Wound Status Wound Number: 2 Primary Etiology: Diabetic Wound/Ulcer of the Lower Extremity Wound Location: Right Amputation Site - Transmetatarsal Wound Status: Open Wounding Event: Surgical Injury Comorbid Lymphedema, Hypertension, Type II Diabetes, Neuropathy History: Date Acquired: 10/30/2021 Weeks Of Treatment: 11 Clustered Wound: No Hernandez, Samuel ([id number]) [id number] Page 8 of 9 Photos Wound Measurements Length: (cm) 1 Width: (cm) 0.2 Depth: (cm) 0.2 Area: (cm) 0.157 Volume: (cm) 0.031 % Reduction in Area: 98.7% % Reduction in Volume: 97.5% Epithelialization: Small (1-33%) Wound Description Classification: Grade 1 Wound Margin: Thickened Exudate Amount: Medium Exudate Type: Serosanguineous Exudate Color: red, brown Foul Odor After Cleansing: No Slough/Fibrino Yes Wound Bed Granulation Amount: Large (67-100%) Exposed Structure Granulation Quality: Red Fascia Exposed: No Necrotic Amount: Small (1-33%) Fat Layer (Subcutaneous Tissue) Exposed: Yes Necrotic Quality: Adherent Slough Tendon Exposed: No Muscle Exposed: No Joint Exposed: No Bone Exposed: No Periwound Skin Texture Texture Color No Abnormalities Noted: No No Abnormalities Noted: Yes Callus: Yes Temperature / Pain Temperature: No Abnormality Moisture No  Abnormalities Noted: Yes Treatment Notes Wound #2 (Amputation Site - Transmetatarsal) Wound Laterality: Right Cleanser Soap and Water Discharge Instruction: May shower and wash wound with dial antibacterial soap and water prior to dressing change. Peri-Wound Care Sween Lotion (Moisturizing lotion) Discharge Instruction: Apply moisturizing lotion as directed Topical Primary Dressing Sorbalgon AG Dressing, 4x4 (in/in) Discharge Instruction: Apply to wound bed as instructed Secondary Dressing Optifoam Non-Adhesive Dressing, 4x4 in Discharge Instruction: Apply over primary dressing cut to make foam donut Woven Gauze Sponges 2x2 in Discharge Instruction: Apply over primary dressing as directed. Secured With Elastic Bandage 4 inch (ACE bandage) Discharge Instruction: Secure with ACE bandage as directed. [address], 4.5x3.1 (in/yd) [place], [name] ([id number]) [id number] Page 9 of 9 Discharge Instruction: Secure with Kerlix as directed. 80M Medipore  H Soft Cloth Surgical T ape, 4 x 10 (in/yd) Discharge Instruction: Secure with tape as directed. Compression Wrap Compression Stockings Add-Ons Electronic Signature(s) Signed: 04/24/2022 3:23:50 PM By: Worthy Rancher Entered By: Worthy Rancher on 04/24/2022 13:59:49 -------------------------------------------------------------------------------- Vitals Details Patient Name: Date of Service: Samuel Pigg MA R 04/24/2022 2:15 PM Medical Record Number: [id number] Patient Account Number: [id number] Date of Birth/Sex: Treating RN: 17-Jul-1965 (56 y.o. M) Primary Care Fritzie Prioleau: Nani Gasser Other Clinician: Referring Sashay Felling: Treating Cedra Villalon/Extender: [place] in Treatment: 11 Vital Signs Time Taken: 01:51 Temperature (F): 97.8 Height (in): 72 Pulse (bpm): 90 Weight (lbs): 214 Respiratory Rate (breaths/min): 20 Body Mass Index (BMI): 29 Blood Pressure (mmHg):  141/82 Capillary Blood Glucose (mg/dl): 115 Reference Range: 80 - 120 mg / dl Electronic Signature(s) Signed: 04/24/2022 3:23:50 PM By: Worthy Rancher Entered By: Worthy Rancher on 04/24/2022 13:58:40

## 2022-04-24 NOTE — Assessment & Plan Note (Signed)
Patient presents with chief complaint of several weeks of right facial swelling.  He reports that this comes and goes and does not seem to have any obvious cause.  Denies any other symptoms including fevers, dry eyes, dry mouth, pain or discomfort.  I am unable to appreciate any abnormalities on exam.  Face appears symmetric, tonsils grossly normal, oral mucosa normal, mentation without any obvious cause for symptoms.  No evidence of TMJ.  No signs of parotid enlargement or inflammation.  No enlarged submandibular lymph nodes.  Unsure of etiology of intermittent swelling but suspicion is low for any dangerous process, infectious or autoimmune.  Patient denies any trauma or prior problems with this area. -Instructed patient to take a photo when swelling returns and call the clinic to discuss -Could consider CT imaging if this problem persists and etiology remains uncertain

## 2022-04-24 NOTE — Patient Instructions (Signed)
[name], it was a pleasure seeing you today!  Today we discussed: Right jaw swelling - call clinic if symptoms return and try to take a picture so we can see what it looks like.  Diabetic eye exam - they should call you to schedule an appointment  I have ordered the following labs today:  Lab Orders  No laboratory test(s) ordered today     Tests ordered today:  none  Referrals ordered today:    Referral Orders         Ambulatory referral to Ophthalmology      I have ordered the following medication/changed the following medications:   Stop the following medications: There are no discontinued medications.   Start the following medications: No orders of the defined types were placed in this encounter.    Follow-up:  Call clinic if symptoms worsen    Please make sure to arrive 15 minutes prior to your next appointment. If you arrive late, you may be asked to reschedule.   We look forward to seeing you next time. Please call our clinic at [phone] if you have any questions or concerns. The best time to call is Monday-Friday from 9am-4pm, but there is someone available 24/7. If after hours or the weekend, call the main hospital number and ask for the Internal Medicine Resident On-Call. If you need medication refills, please notify your pharmacy one week in advance and they will send [place] a request.  Thank you for letting us take part in your care. Wishing you the best!  Thank you, Linward Natal, MD

## 2022-04-24 NOTE — Assessment & Plan Note (Signed)
Will place referral for diabetic eye exam today per patient's request.

## 2022-04-24 NOTE — Telephone Encounter (Signed)
Called due to no response on appeal for the denied PA for [name]'s [employer] 3 sensors. The PA was missing information that was provided today./ the new PA# is [id number] and we should hear something within 24 hours.

## 2022-04-24 NOTE — Progress Notes (Signed)
   CC: Right facial swelling  HPI:  [name] is a 57 y.o. male with past medical history as below who presents for right facial swelling.  See detailed assessment and plan for HPI.  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  Physical Exam:  Vitals:   04/24/22 0836  BP: 129/75  Pulse: 88  Temp: 98.6 F (37 C)  TempSrc: Oral  SpO2: 100%  Weight: 221 lb 4.8 oz (100.4 kg)   Physical Exam Constitutional:      General: He is not in acute distress. HENT:     Head: Normocephalic and atraumatic.     Jaw: There is normal jaw occlusion. No tenderness or swelling.     Salivary Glands: Right salivary gland is not diffusely enlarged or tender. Left salivary gland is not diffusely enlarged or tender.     Mouth/Throat:     Mouth: Mucous membranes are moist.     Dentition: No gingival swelling or dental caries.     Tongue: No lesions. Tongue does not deviate from midline.     Pharynx: Oropharynx is clear. Uvula midline. No pharyngeal swelling or uvula swelling.     Tonsils: No tonsillar exudate.  Eyes:     Extraocular Movements: Extraocular movements intact.  Pulmonary:     Effort: Pulmonary effort is normal.  Musculoskeletal:     Cervical back: Normal range of motion. No rigidity or tenderness.  Lymphadenopathy:     Head:     Right side of head: No submandibular or preauricular adenopathy.     Left side of head: No submandibular or preauricular adenopathy.  Neurological:     Mental Status: He is alert.      Assessment & Plan:   See Encounters Tab for problem based charting.  Facial swelling Patient presents with chief complaint of several weeks of right facial  swelling.  He reports that this comes and goes and does not seem to have any obvious cause.  Denies any other symptoms including fevers, dry eyes, dry mouth, pain or discomfort.  I am unable to appreciate any abnormalities on exam.  Face appears symmetric, tonsils grossly normal, oral mucosa normal, mentation without any obvious cause for symptoms.  No evidence of TMJ.  No signs of parotid enlargement or inflammation.  No enlarged submandibular lymph nodes.  Unsure of etiology of intermittent swelling but suspicion is low for any dangerous process, infectious or autoimmune.  Patient denies any trauma or prior problems with this area. -Instructed patient to take a photo when swelling returns and call the clinic to discuss -Could consider CT imaging if this problem persists and etiology remains uncertain  Type 2 diabetes mellitus with hyperglycemia ([place]) Will place referral for diabetic eye exam today per patient's request.   Patient discussed with Dr. Daryll Drown

## 2022-04-24 NOTE — Progress Notes (Signed)
[name], [name] ([id number]) [id number] Page 1 of 9 Visit Report for 04/24/2022 Chief Complaint Document Details Patient Name: Date of Service: Samuel Pigg MA R 04/24/2022 2:15 PM Medical Record Number: [id number] Patient Account Number: [id number] Date of Birth/Sex: Treating RN: 1965/05/25 (57 y.o. 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 Collene Gobble Primary Care Provider: Nani Gasser Other Clinician: Referring Provider: Treating Provider/Extender: San Morelle in Treatment: 11 Debridement Performed for Assessment: Wound #2 Right Amputation Site - Transmetatarsal Performed By: Physician Fredirick Maudlin, MD Debridement Type: Debridement Severity of Tissue Pre Debridement: Fat layer exposed Level of Consciousness (Pre-procedure): Awake and Alert Pre-procedure Verification/Time Out Yes - 14:34 Taken: Start Time: 14:34 Pain Control: Lidocaine 4% T opical Solution T Area Debrided (L x W): otal 1 (cm) x 0.2 (cm) = 0.2 (cm) Tissue and other material debrided: Non-Viable, Callus, Slough, Slough Level: Non-Viable Tissue Debridement  Description: Selective/Open Wound Instrument: Curette Bleeding: Minimum Hemostasis Achieved: Pressure End Time: 14:35 Procedural Pain: 0 Post Procedural Pain: 0 Response to Treatment: Procedure was tolerated well Level of Consciousness (Post- Awake and Alert procedure): Post Debridement Measurements of Total Wound Length: (cm) 1 Width: (cm) 0.2 Depth: (cm) 0.2 Volume: (cm) 0.031 Character of Wound/Ulcer Post Debridement: Improved Severity of Tissue Post Debridement: Fat layer exposed Post Procedure Diagnosis Same as Pre-procedure Notes Scribed for Dr. Celine Ahr by J.Scotton [place], [place] ([id number]) [id number] Page 2 of 9 Electronic Signature(s) Signed: 04/24/2022 3:46:01 PM By: Dellie Catholic RN Signed: 04/24/2022 4:30:18 PM By: Fredirick Maudlin MD FACS Entered By: Dellie Catholic on 04/24/2022 14:38:15 -------------------------------------------------------------------------------- Debridement Details Patient Name: Date of Service: Samuel Pigg MA R 04/24/2022 2:15 PM Medical Record Number: [id number] Patient Account Number: [id number] Date of Birth/Sex: Treating RN: January 12, 1966 (57 y.o. Collene Gobble Primary Care Provider: Nani Gasser Other Clinician: Referring Provider: Treating Provider/Extender: San Morelle in Treatment: 11 Debridement Performed for Assessment: Wound #1 Left Amputation Site - Transmetatarsal Performed By: Physician Fredirick Maudlin, MD Debridement Type: Debridement Severity of Tissue Pre Debridement: Fat layer exposed Level of Consciousness (Pre-procedure): Awake and Alert Pre-procedure Verification/Time Out Yes - 14:34 Taken: Start Time: 14:34 Pain Control: Lidocaine 4% T opical Solution T Area Debrided (L x W): otal 0.1 (cm) x 0.1 (cm) = 0.01 (cm) Tissue and other material debrided: Non-Viable, Callus, Slough, Slough Level: Non-Viable Tissue Debridement Description: Selective/Open  Wound Instrument: Curette Bleeding: Minimum Hemostasis Achieved: Pressure End Time: 14:35 Procedural Pain: 0 Post Procedural Pain: 0 Response to Treatment: Procedure was tolerated well Level of Consciousness (Post- Awake and Alert procedure): Post Debridement Measurements of Total Wound Length: (cm) 0.1 Width: (cm) 0.1 Depth: (cm) 0.1 Volume: (cm) 0.001 Character of Wound/Ulcer Post Debridement: Improved Severity of Tissue Post Debridement: Fat layer exposed Post Procedure Diagnosis Same as Pre-procedure Notes Scribed for Dr. Celine Ahr by J.Scotton Electronic Signature(s) Signed: 04/24/2022 3:46:01 PM By: Dellie Catholic RN Signed: 04/24/2022 4:30:18 PM By: Celine Ahr,  Anderson [place] MD FACS Entered By: Dellie Catholic on 04/24/2022 14:39:44 -------------------------------------------------------------------------------- HPI Details Patient Name: Date of Service: Samuel Pigg MA R 04/24/2022 2:15 PM Medical Record Number: [id number] Patient Account Number: [id number] Date of Birth/Sex: Treating RN: 1965-09-24 (57 y.o. M) Primary Care Provider: Nani Gasser Other Clinician: Referring Provider: Treating Provider/Extender: San Morelle in Treatment: 11 History of Present Illness HPI Description: ADMISSION [name] ([id number]) [id number] Page 3 of 9 02/02/2022 This is a 57 year old [place] man who speaks only Arabic. The visit today was conducted with the assistance of the language line interpreter; the patient declines the use of an in person interpreter. He is a poorly controlled diabetic (last hemoglobin A1c 10.8, but it has been as high as 15 in the past) with CKD stage III and hypertension. 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 He required revision of the right TMA in October. This was done in the [facility] system. It is not  entirely clear how he was referred to our clinic however he has openings on the distal portion of the right foot as well as drainage coming from the plantar aspect of his left foot underneath some callus. The patient has just been applying dry gauze to both areas. 02/09/2022: The left-sided wound is quite a bit smaller today, but is covered with callus. The right sided wound has also contracted a little bit and is very clean, but there is also callus accumulation around the perimeter. Unfortunately, due to his Medicaid status, we have been unable to secure home health assistance and he has been changing the dressings on his own. He also has difficulty obtaining dressing supplies. 02/19/2022: Both wounds are smaller, particularly on the right. The left is covered with a layer of callus. No concern for infection. 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 There is light slough on the surface with some surrounding callus. The left plantar foot wound has callused over. Underneath the callus, the there is a small opening in the skin. 03/06/2022: The wounds are about the same size this week. There is callus accumulation around each, but the surfaces are cleaner. 03/19/2022: The wound on the right is quite a bit smaller this week. There is minimal callus accumulation and light slough on the surface. The wound on the left is almost completely covered in callus. 03/26/2022: The wound on the right continues to contract and is narrower again this week. The wound on the left has become covered in callus once again. 04/24/2022: In the month since he was last seen, apparently the patient developed some purulent drainage from his foot and was seen by podiatry at [place]. He was debrided and given antibiotics. Subsequently, his wounds actually look extremely good today. There is callus covering the plantar ulcer on the left. The right TMA site is quite a bit narrower and clean with just some  callus and slough accumulation. Electronic Signature(s) Signed: 04/24/2022 3:50:25 PM By: Fredirick Maudlin MD FACS Entered By: Fredirick Maudlin on 04/24/2022 15:50:25 -------------------------------------------------------------------------------- Physical Exam Details Patient Name: Date of Service: Samuel Pigg MA R 04/24/2022 2:15 PM Medical Record Number: [id number] Patient Account Number: [id number] Date of Birth/Sex: Treating RN: 1965-07-08 (57 y.o. M) Primary Care Provider: Nani Gasser Other Clinician: Referring Provider: Treating Provider/Extender: San Morelle in Treatment: 11 Constitutional Slightly hypertensive. . . . no acute distress. Respiratory Normal work of breathing on room air. Notes 04/24/2022: There  is callus covering the plantar ulcer on the left. The right TMA site is quite a bit narrower and clean with just some callus and slough accumulation. Electronic Signature(s) Signed: 04/24/2022 3:59:39 PM By: Fredirick Maudlin MD FACS Entered By: Fredirick Maudlin on 04/24/2022 15:59:39 -------------------------------------------------------------------------------- Physician Orders Details Patient Name: Date of Service: Samuel Pigg MA R 04/24/2022 2:15 PM Medical Record Number: [id number] Patient Account Number: [id number] Date of Birth/Sex: Treating RN: 1965-07-28 (57 y.o. 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 - Dr. Celine Ahr Room 1 +++ INTERPRETER [facility] Return A Other: - Doctor's note for spouse for work Anesthetic Wound #1 Left Amputation Site - Transmetatarsal (In clinic) Topical Lidocaine 4% applied to wound bed Bathing/ Shower/ Hygiene May shower and wash wound with soap and water. Off-Loading Wound #2 Right Amputation Site - Transmetatarsal DH Walker Boot to: - right foot Other: - Please elevate feet throughout the day Wound Treatment Wound #1 - Amputation Site - Transmetatarsal Wound Laterality: Left Cleanser: Soap and Water 1 x Per Day/30 Days Discharge Instructions: May shower and wash wound with dial antibacterial soap and water prior to dressing change. Peri-Wound Care: Sween Lotion (Moisturizing lotion) 1 x Per Day/30 Days Discharge Instructions: Apply moisturizing lotion as directed Prim Dressing: Sorbalgon AG Dressing, 4x4 (in/in) 1 x Per Day/30 Days ary Discharge Instructions: Apply to wound bed as instructed Secondary Dressing: Optifoam Non-Adhesive Dressing, 4x4 in 1 x Per Day/30 Days Discharge Instructions: Apply over primary dressing cut to make foam donut Secondary Dressing: Woven Gauze Sponges 2x2 in 1 x Per Day/30 Days Discharge Instructions: Apply over primary dressing as directed. Secured With: Elastic Bandage 4 inch (ACE bandage) 1 x Per Day/30 Days Discharge Instructions: Secure with ACE bandage as directed. Secured With: [employer], 4.5x3.1 (in/yd) 1 x Per Day/30 Days Discharge Instructions: Secure with Kerlix as directed. Secured With: 80M Medipore H Soft Cloth Surgical T ape, 4 x 10 (in/yd) 1 x Per Day/30 Days Discharge Instructions: Secure with tape as directed. Wound #2 - Amputation Site - Transmetatarsal Wound Laterality: Right Cleanser: Soap and Water 1 x Per Day/30 Days Discharge Instructions: May shower and wash wound with dial  antibacterial soap and water prior to dressing change. Peri-Wound Care: Sween Lotion (Moisturizing lotion) 1 x Per Day/30 Days Discharge Instructions: Apply moisturizing lotion as directed Prim Dressing: Sorbalgon AG Dressing, 4x4 (in/in) 1 x Per Day/30 Days ary Discharge Instructions: Apply to wound bed as instructed Secondary Dressing: Optifoam Non-Adhesive Dressing, 4x4 in 1 x Per Day/30 Days Discharge Instructions: Apply over primary dressing cut to make foam donut Secondary Dressing: Woven Gauze Sponges 2x2 in 1 x Per Day/30 Days Discharge Instructions: Apply over primary dressing as directed. Secured With: Elastic Bandage 4 inch (ACE bandage) 1 x Per Day/30 Days Discharge Instructions: Secure with ACE bandage as directed. Secured With: [employer], 4.5x3.1 (in/yd) 1 x Per Day/30 Days Discharge Instructions: Secure with Kerlix as directed. Secured With: 80M Medipore H Soft Cloth Surgical T ape, 4 x 10 (  in/yd) 1 x Per Day/30 Days Discharge Instructions: Secure with tape as directed. Electronic Signature(s) Signed: 04/24/2022 4:30:18 PM By: Fredirick Maudlin MD FACS Previous Signature: 04/24/2022 3:46:01 PM Version By: Dellie Catholic RN [name], [place] ([id number]) [id number] Page 5 of 9 Entered By: Fredirick Maudlin on 04/24/2022 15:59:59 -------------------------------------------------------------------------------- Problem List Details Patient Name: Date of Service: Samuel Pigg MA R 04/24/2022 2:15 PM Medical Record Number: [id number] Patient Account Number: [id number] Date of Birth/Sex: Treating RN: September 21, 1965 (57 y.o. 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  02/02/2022 No Yes without evidence of necrosis L97.522 Non-pressure chronic ulcer of other part of left foot with fat layer exposed 02/02/2022 No Yes E11.621 Type 2 diabetes mellitus with foot ulcer 02/02/2022 No Yes Z89.439 Acquired absence of unspecified foot 02/02/2022 No Yes Inactive Problems Resolved Problems Electronic Signature(s) Signed: 04/24/2022 3:49:03 PM By: Fredirick Maudlin MD FACS Entered By: Fredirick Maudlin on 04/24/2022 15:49:03 -------------------------------------------------------------------------------- Progress Note Details Patient Name: Date of Service: Samuel Pigg MA R 04/24/2022 2:15 PM Medical Record Number: [id number] Patient Account Number: [id number] Date of Birth/Sex: Treating RN: 08/26/1965 (57 y.o. 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 The visit today was conducted with the assistance of the language line interpreter; the patient declines the use of an in person interpreter. He is a poorly controlled diabetic (last hemoglobin A1c 10.8, but it has been as high as 15 in the past) with CKD stage III and hypertension. 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 He required revision of the right TMA in October. This was done in the [facility] system. It is not entirely clear how he was referred to our clinic however he has openings on the distal portion of the right foot as well as  drainage coming from the plantar aspect of his left foot underneath some callus. The patient has just been applying dry gauze to both areas. 02/09/2022: The left-sided wound is quite a bit smaller today, but is covered with callus. The right sided wound has also contracted a little bit and is very clean, but there is also callus accumulation around the perimeter. Unfortunately, due to his Medicaid status, we have been unable to secure home health assistance [place], [place] ([id number]) [id number] Page 6 of 9 and he has been changing the dressings on his own. He also has difficulty obtaining dressing supplies. 02/19/2022: Both wounds are smaller, particularly on the right. The left is covered with a layer of callus. No concern for infection. 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 There is light slough on the surface with some surrounding callus. The left plantar foot wound has callused over. Underneath the callus, the there is a small opening in the skin. 03/06/2022: The wounds are about the same size this week. There is callus accumulation around each, but the surfaces are cleaner. 03/19/2022: The wound on the  right is quite a bit smaller this week. There is minimal callus accumulation and light slough on the surface. The wound on the left is almost completely covered in callus. 03/26/2022: The wound on the right continues to contract and is narrower again this week. The wound on the left has become covered in callus once again. 04/24/2022: In the month since he was last seen, apparently the patient developed some purulent drainage from his foot and was seen by podiatry at [place]. He was debrided and given antibiotics. Subsequently, his wounds actually look extremely good today. There is callus covering the plantar ulcer on the left. The right TMA site is quite a bit narrower and clean with just some callus and slough accumulation. Patient  History Family History Unknown History, Cancer, No family history of Diabetes, Heart Disease, Hereditary Spherocytosis, Hypertension, Kidney Disease, Lung Disease, Seizures, Stroke, Thyroid Problems, Tuberculosis. Social History Never smoker, Marital Status - Married, Alcohol Use - Never, Drug Use - No History, Caffeine Use - Rarely. Medical History Hematologic/Lymphatic Patient has history of Lymphedema Cardiovascular Patient has history of Hypertension Endocrine Patient has history of Type II Diabetes Neurologic Patient has history of Neuropathy Medical A Surgical History Notes nd Genitourinary AKI Objective Constitutional Slightly hypertensive. no acute distress. Vitals Time Taken: 1:51 AM, Height: 72 in, Weight: 214 lbs, BMI: 29, Temperature: 97.8 F, Pulse: 90 bpm, Respiratory Rate: 20 breaths/min, Blood Pressure: 141/82 mmHg, Capillary Blood Glucose: 115 mg/dl. Respiratory Normal work of breathing on room air. General Notes: 04/24/2022: There is callus covering the plantar ulcer on the left. The right TMA site is quite a bit narrower and clean with just some callus and slough accumulation. Integumentary (Hair, Skin) Wound #1 status is Open. Original cause of wound was Surgical Injury. The date acquired was: 02/01/2022. The wound has been in treatment 11 weeks. The wound is located on the Left Amputation Site - Transmetatarsal. The wound measures 0.1cm length x 0.1cm width x 0.1cm depth; 0.008cm^2 area and 0.001cm^3 volume. There is Fat Layer (Subcutaneous Tissue) exposed. There is no tunneling or undermining noted. There is a small amount of serosanguineous drainage noted. The wound margin is well defined and not attached to the wound base. There is small (1-33%) red granulation within the wound bed. There is a large (67-100%) amount of necrotic tissue within the wound bed including Eschar. The periwound skin appearance had no abnormalities noted for color. The periwound skin  appearance exhibited: Callus, Dry/Scaly. The periwound skin appearance did not exhibit: Maceration. Periwound temperature was noted as No Abnormality. The periwound has tenderness on palpation. Wound #2 status is Open. Original cause of wound was Surgical Injury. The date acquired was: 10/30/2021. The wound has been in treatment 11 weeks. The wound is located on the Right Amputation Site - Transmetatarsal. The wound measures 1cm length x 0.2cm width x 0.2cm depth; 0.157cm^2 area and 0.031cm^3 volume. There is Fat Layer (Subcutaneous Tissue) exposed. There is a medium amount of serosanguineous drainage noted. The wound margin is thickened. There is large (67-100%) red granulation within the wound bed. There is a small (1-33%) amount of necrotic tissue within the wound bed including Adherent Slough. The periwound skin appearance had no abnormalities noted for moisture. The periwound skin appearance had no abnormalities noted for color. The periwound skin appearance exhibited: Callus. Periwound temperature was noted as No Abnormality. 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 There was a Selective/Open Wound Non-Viable Tissue Debridement with a total area of 0.01 sq cm performed by Fredirick Maudlin, MD. With the following instrument(s): Curette to remove Non-Viable tissue/material. Material removed includes Callus and Slough and after achieving pain control  using Lidocaine 4% Topical Solution. No specimens were taken. A time out was conducted at 14:34, prior to the start of the procedure. A Minimum amount of bleeding was controlled with Pressure. The procedure was tolerated well with a pain level of 0 throughout and a pain level of 0 following the procedure. Post Debridement Measurements: 0.1cm length x 0.1cm width x 0.1cm depth; 0.001cm^3 volume. Character of Wound/Ulcer Post Debridement is improved. Severity of Tissue Post Debridement is: Fat layer exposed. Post procedure Diagnosis Wound #1: Same as Pre-Procedure General Notes: Scribed for Dr. Celine Ahr by J.Scotton. Wound #2 Pre-procedure diagnosis of Wound #2 is a Diabetic Wound/Ulcer of the Lower Extremity located on the Right Amputation Site - Transmetatarsal .Severity of Tissue Pre Debridement is: Fat layer exposed. There was a Selective/Open Wound Non-Viable Tissue Debridement with a total area of 0.2 sq cm performed by Fredirick Maudlin, MD. With the following instrument(s): Curette to remove Non-Viable tissue/material. Material removed includes Callus and Slough and after achieving pain control using Lidocaine 4% Topical Solution. No specimens were taken. A time out was conducted at 14:34, prior to the start of the procedure. A Minimum amount of bleeding was controlled with Pressure. The procedure was tolerated well with a pain level of 0 throughout and a pain level of 0 following the procedure. Post Debridement Measurements: 1cm length x 0.2cm width x 0.2cm depth; 0.031cm^3 volume. Character of Wound/Ulcer Post Debridement is improved. Severity of Tissue Post Debridement is: Fat layer exposed. Post procedure Diagnosis Wound #2: Same as Pre-Procedure General Notes: Scribed for Dr. Celine Ahr by J.Scotton. Plan Follow-up Appointments: Return Appointment in 1 week. - Dr. Celine Ahr Room 1 +++ INTERPRETER [facility] Other: - Doctor's note for spouse for work Anesthetic: Wound #1 Left Amputation  Site - Transmetatarsal: (In clinic) Topical Lidocaine 4% applied to wound bed Bathing/ Shower/ Hygiene: May shower and wash wound with soap and water. Off-Loading: Wound #2 Right Amputation Site - Transmetatarsal: DH Walker Boot to: - right foot Other: - Please elevate feet throughout the day WOUND #1: - Amputation Site - Transmetatarsal Wound Laterality: Left Cleanser: Soap and Water 1 x Per Day/30 Days Discharge Instructions: May shower and wash wound with dial antibacterial soap and water prior to dressing change. Peri-Wound Care: Sween Lotion (Moisturizing lotion) 1 x Per Day/30 Days Discharge Instructions: Apply moisturizing lotion as directed Prim Dressing: Sorbalgon AG Dressing, 4x4 (in/in) 1 x Per Day/30 Days ary Discharge Instructions: Apply to wound bed as instructed Secondary Dressing: Optifoam Non-Adhesive Dressing, 4x4 in 1 x Per Day/30 Days Discharge Instructions: Apply over primary dressing cut to make foam donut Secondary Dressing: Woven Gauze Sponges 2x2 in 1 x Per Day/30 Days Discharge Instructions: Apply over primary dressing as directed. Secured With: Elastic Bandage 4 inch (ACE bandage) 1 x Per Day/30 Days Discharge Instructions: Secure with ACE bandage as directed. Secured With: [employer], 4.5x3.1 (in/yd) 1 x Per Day/30 Days Discharge  Instructions: Secure with Kerlix as directed. Secured With: 76M Medipore H Soft Cloth Surgical T ape, 4 x 10 (in/yd) 1 x Per Day/30 Days Discharge Instructions: Secure with tape as directed. WOUND #2: - Amputation Site - Transmetatarsal Wound Laterality: Right Cleanser: Soap and Water 1 x Per Day/30 Days Discharge Instructions: May shower and wash wound with dial antibacterial soap and water prior to dressing change. Peri-Wound Care: Sween Lotion (Moisturizing lotion) 1 x Per Day/30 Days Discharge Instructions: Apply moisturizing lotion as directed Prim Dressing: Sorbalgon AG Dressing, 4x4 (in/in) 1 x Per Day/30  Days ary Discharge Instructions: Apply to wound bed as instructed Secondary Dressing: Optifoam Non-Adhesive Dressing, 4x4 in 1 x Per Day/30 Days Discharge Instructions: Apply over primary dressing cut to make foam donut Secondary Dressing: Woven Gauze Sponges 2x2 in 1 x Per Day/30 Days Discharge Instructions: Apply over primary dressing as directed. Secured With: Elastic Bandage 4 inch (ACE bandage) 1 x Per Day/30 Days Discharge Instructions: Secure with ACE bandage as directed. Secured With: [employer], 4.5x3.1 (in/yd) 1 x Per Day/30 Days Discharge Instructions: Secure with Kerlix as directed. Secured With: 76M Medipore H Soft Cloth Surgical T ape, 4 x 10 (in/yd) 1 x Per Day/30 Days Discharge Instructions: Secure with tape as directed. [name], [name] ([id number]) [id number] Page 8 of 9 04/24/2022: There is callus covering the plantar ulcer on the left. The right TMA site is quite a bit narrower and clean with just some callus and slough accumulation. I used a curette to debride callus and slough from both wound sites. We will continue silver alginate to both locations. He will follow-up in 1 week. Electronic Signature(s) Signed: 04/24/2022 4:00:46 PM By: Fredirick Maudlin MD FACS Entered By: Fredirick Maudlin on 04/24/2022 16:00:46 -------------------------------------------------------------------------------- HxROS Details Patient Name: Date of Service: Samuel Pigg MA R 04/24/2022 2:15 PM Medical Record Number: [id number] Patient Account Number: [id number] Date of Birth/Sex: Treating RN: 1965/08/25 (57 y.o. M) Primary Care Provider: Nani Gasser Other Clinician: Referring Provider: Treating Provider/Extender: [place] in Treatment: 11 Hematologic/Lymphatic Medical History: Positive for: Lymphedema Cardiovascular Medical History: Positive for: Hypertension Endocrine Medical History: Positive for: Type II  Diabetes Treated with: Insulin, Oral agents Genitourinary Medical History: Past Medical History Notes: AKI Neurologic Medical History: Positive for: Neuropathy Immunizations Pneumococcal Vaccine: Received Pneumococcal Vaccination: Yes Received Pneumococcal Vaccination On or After 60th Birthday: Yes Implantable Devices No devices added Family and Social History Unknown History: Yes; Cancer: Yes; Diabetes: No; Heart Disease: No; Hereditary Spherocytosis: No; Hypertension: No; Kidney Disease: No; Lung Disease: No; Seizures: No; Stroke: No; Thyroid Problems: No; Tuberculosis: No; Never smoker; Marital Status - Married; Alcohol Use: Never; Drug Use: No History; Caffeine Use: Rarely; Financial Concerns: No; Food, Clothing or Shelter Needs: No; Support System Lacking: No; Transportation Concerns: No Electronic Signature(s) Signed: 04/24/2022 4:30:18 PM By: Fredirick Maudlin MD FACS Entered By: Fredirick Maudlin on 04/24/2022 15:50:31 SuperBill Details -------------------------------------------------------------------------------- [name] ([id number]) [id number] Page 9 of 9 Patient Name: Date of Service: [name] [place] R 04/24/2022 Medical Record Number: [id number] Patient Account Number: [id number] Date of Birth/Sex: Treating RN: 12-25-1965 (57 y.o. 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 E11.621 Type 2 diabetes mellitus with foot  ulcer Z89.439 Acquired absence of unspecified foot Facility Procedures CPT4 Code Description Modifier Quantity [id number] - DEBRIDE WOUND 1ST 20 SQ CM OR  < 1 ICD-10 Diagnosis Description 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 Physician Procedures Quantity CPT4 Code Description Modifier [id number] - WC PHYS LEVEL 3 - EST PT 25 1 ICD-10 Diagnosis Description 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

## 2022-04-25 NOTE — Telephone Encounter (Addendum)
The pharmacy department has reviewed the request submitted by [name] for the following medications.  Drug:Freestyle Libre 3 Sensor Miscellaneous  Quantity:2   Duration:30 And has determined the following  Freestyle Libre 3 Sensor Miscellaneous a quantity of 2 for 30 days has been approved for 6 months from 04/24/22-10/25/22.  Approval has been faxed to the pharmacy.

## 2022-05-01 ENCOUNTER — Encounter (HOSPITAL_BASED_OUTPATIENT_CLINIC_OR_DEPARTMENT_OTHER): Payer: Medicaid Other | Attending: General Surgery | Admitting: General Surgery

## 2022-05-01 DIAGNOSIS — E1140 Type 2 diabetes mellitus with diabetic neuropathy, unspecified: Secondary | ICD-10-CM | POA: Diagnosis not present

## 2022-05-01 DIAGNOSIS — N183 Chronic kidney disease, stage 3 unspecified: Secondary | ICD-10-CM | POA: Diagnosis not present

## 2022-05-01 DIAGNOSIS — E11621 Type 2 diabetes mellitus with foot ulcer: Secondary | ICD-10-CM | POA: Insufficient documentation

## 2022-05-01 DIAGNOSIS — E1122 Type 2 diabetes mellitus with diabetic chronic kidney disease: Secondary | ICD-10-CM | POA: Diagnosis not present

## 2022-05-01 DIAGNOSIS — I890 Lymphedema, not elsewhere classified: Secondary | ICD-10-CM | POA: Diagnosis not present

## 2022-05-01 DIAGNOSIS — L97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 without evidence of necrosis: Secondary | ICD-10-CM | POA: Insufficient documentation

## 2022-05-01 DIAGNOSIS — I129 Hypertensive chronic kidney disease with stage 1 through stage 4 chronic kidney disease, or unspecified chronic kidney disease: Secondary | ICD-10-CM | POA: Diagnosis not present

## 2022-05-01 NOTE — Progress Notes (Signed)
[place], [name] ([id number]) [id number] Page 1 of 8 Visit Report for 05/01/2022 Arrival Information Details Patient Name: Date of Service: Samuel Pigg MA R 05/01/2022 7:30 A M Medical Record Number: [id number] Patient Account Number: [id number] Date of Birth/Sex: Treating RN: [birth year] (57 y.o. [name] Primary Care Melchizedek Espinola: Nani Gasser Other Clinician: Referring Berel Najjar: Treating Macklin Jacquin/Extender: [place] in Treatment: 12 Visit Information History Since Last Visit Added or deleted any medications: No Patient Arrived: Ambulatory Any new allergies or adverse reactions: No Arrival Time: 07:37 Had a fall or experienced change in No Accompanied By: spouse activities of daily living that may affect Transfer Assistance: None risk of falls: Patient Identification Verified: Yes Signs or symptoms of abuse/neglect since last visito No Secondary Verification Process Completed: Yes Hospitalized since last visit: No Patient Requires Transmission-Based Precautions: No Implantable device outside of the clinic excluding No Patient Has Alerts: No cellular tissue based products placed in the center since last visit: Has Dressing in Place as Prescribed: No Pain Present Now: No Electronic Signature(s) Signed: 05/01/2022 4:19:23 PM By: Baruch Gouty RN, BSN Entered By: Baruch Gouty on 05/01/2022 07:38:15 -------------------------------------------------------------------------------- Encounter Discharge Information Details Patient Name: Date of Service: Samuel Pigg MA R 05/01/2022 7:30 A M Medical Record Number: [id number] Patient Account Number: [id number] Date of Birth/Sex: Treating RN: [birth year] (57 y.o. [name] Primary Care [name]: Nani Gasser Other Clinician: Referring Herschell Virani: Treating Alysse Rathe/Extender: [place] in Treatment: 12 Encounter Discharge Information Items  Post Procedure Vitals Discharge Condition: Stable Temperature (F): 99.2 Ambulatory Status: Ambulatory Pulse (bpm): 80 Discharge Destination: Home Respiratory Rate (breaths/min): 18 Transportation: Private Auto Blood Pressure (mmHg): 152/81 Accompanied By: spouse Schedule Follow-up Appointment: Yes Clinical Summary of Care: Patient Declined Electronic Signature(s) Signed: 05/01/2022 4:19:23 PM By: Baruch Gouty RN, BSN Entered By: Baruch Gouty on 05/01/2022 08:22:36 -------------------------------------------------------------------------------- Lower Extremity Assessment Details Patient Name: Date of Service: Samuel Pigg MA R 05/01/2022 7:30 A M Medical Record Number: [id number] Patient Account Number: [id number] Date of Birth/Sex: Treating RN: 26-Nov-1965 (57 y.o. [name] Primary Care [name]: Nani Gasser Other Clinician: Referring Ikenna Ohms: Treating [name]/Extender: [place] in Treatment: 12 Edema Assessment Assessed: [name]: No] [name]: No] O[LeftDollene [place], [name] [id number] [[id number].pdf Page 2 of 8] Edema: [Left: No] [Right: No] Calf Left: Right: Point of Measurement: From Medial Instep 36 cm 35 cm Ankle Left: Right: Point of Measurement: From Medial Instep 24 cm 23.5 cm Vascular Assessment Pulses: Dorsalis Pedis Palpable: [Left:Yes] [Right:Yes] Electronic Signature(s) Signed: 05/01/2022 4:19:23 PM By: Baruch Gouty RN, BSN Entered By: Baruch Gouty on 05/01/2022 07:41:07 -------------------------------------------------------------------------------- Multi Wound Chart Details Patient Name: Date of Service: Samuel Pigg MA R 05/01/2022 7:30 A M Medical Record Number: [id number] Patient Account Number: [id number] Date of Birth/Sex: Treating RN: [birth year] (57 y.o. M) Primary Care [name]: Nani Gasser Other Clinician: Referring Markas Aldredge: Treating Gia Lusher/Extender: [place] in Treatment: 12 Vital Signs Height(in): 10 Capillary Blood Glucose(mg/dl): 169 Weight(lbs): 214 Pulse(bpm): 49 Body Mass Index(BMI): 29 Blood Pressure(mmHg): 152/81 Temperature(F): 99.2 Respiratory Rate(breaths/min): 18 [1:Photos:] [N/A:N/A] Left Amputation Site - Transmetatarsal Right Amputation Site - N/A Wound Location: Transmetatarsal Surgical Injury Surgical Injury N/A Wounding Event: Diabetic Wound/Ulcer of the Lower Diabetic Wound/Ulcer of the Lower N/A Primary Etiology: Extremity Extremity Lymphedema, Hypertension, Type II Lymphedema, Hypertension, Type II N/A Comorbid History: Diabetes, Neuropathy Diabetes, Neuropathy 02/01/2022 10/30/2021 N/A Date Acquired: 12 12 N/A Weeks of Treatment: Open Open N/A Wound  Status: No No N/A Wound Recurrence: 0x0x0 1.2x0.1x0.4 N/A Measurements L x W x D (cm) 0 0.094 N/A A (cm) : rea 0 0.038 N/A Volume (cm) : 100.00% 99.20% N/A % Reduction in A rea: 100.00% 96.90% N/A % Reduction in Volume: Grade 1 Grade 1 N/A Classification: None Present Small N/A Exudate A mount: N/A Serosanguineous N/A Exudate Type: N/A red, brown N/A Exudate Color: N/A Thickened N/A Wound Margin: None Present (0%) Large (67-100%) N/A Granulation A mount: N/A Red N/A Granulation Quality: None Present (0%) Small (1-33%) N/A Necrotic A mount: Fascia: No Fat Layer (Subcutaneous Tissue): Yes N/A Exposed Structures: [name] ([id number].pdf Page 3 of 8 Fat Layer (Subcutaneous Tissue): No Fascia: No Tendon: No Tendon: No Muscle: No Muscle: No Joint: No Joint: No Bone: No Bone: No Large (67-100%) Medium (34-66%) N/A Epithelialization: N/A Debridement - Selective/Open Wound N/A Debridement: Pre-procedure Verification/Time Out N/A 07:50 N/A Taken: N/A Lidocaine 4% Topical Solution N/A Pain Control: N/A Callus N/A Tissue Debrided: N/A Skin/Epidermis  N/A Level: N/A 2 N/A Debridement A (sq cm): rea N/A Curette N/A Instrument: N/A Minimum N/A Bleeding: N/A Pressure N/A Hemostasis A chieved: N/A 1 N/A Procedural Pain: N/A 0 N/A Post Procedural Pain: N/A Procedure was tolerated well N/A Debridement Treatment Response: N/A 1.2x0.1x0.1 N/A Post Debridement Measurements L x W x D (cm) N/A 0.009 N/A Post Debridement Volume: (cm) Callus: Yes Callus: Yes N/A Periwound Skin Texture: Dry/Scaly: Yes Maceration: Yes N/A Periwound Skin Moisture: Maceration: No Dry/Scaly: No No Abnormalities Noted No Abnormalities Noted N/A Periwound Skin Color: No Abnormality No Abnormality N/A Temperature: Yes Yes N/A Tenderness on Palpation: Paring/cutting 1 benign hyperkeratotic Debridement N/A Procedures Performed: lesion Treatment Notes Electronic Signature(s) Signed: 05/01/2022 8:07:01 AM By: Fredirick Maudlin MD FACS Entered By: Fredirick Maudlin on 05/01/2022 08:07:00 -------------------------------------------------------------------------------- Multi-Disciplinary Care Plan Details Patient Name: Date of Service: Samuel Pigg MA R 05/01/2022 7:30 A M Medical Record Number: [id number] Patient Account Number: [id number] Date of Birth/Sex: Treating RN: 1965/07/02 (57 y.o. [name] Primary Care Karyssa Amaral: Nani Gasser Other Clinician: Referring Edgar Reisz: Treating Zyan Coby/Extender: [place] in Treatment: [place] reviewed with physician Active Inactive Nutrition Nursing Diagnoses: Impaired glucose control: actual or potential Goals: Patient/caregiver verbalizes understanding of need to maintain therapeutic glucose control per primary care physician Date Initiated: 02/02/2022 Target Resolution Date: 06/27/2022 Goal Status: Active Interventions: Assess patient nutrition upon admission and as needed per policy Provide education on elevated blood sugars and impact on  wound healing Treatment Activities: Dietary management education, guidance and counseling : 02/02/2022 Notes: Wound/Skin Impairment Nursing Diagnoses: Impaired tissue integrity [place], [name] ([id number]) [phone].pdf Page 4 of 8 Goals: Patient/caregiver will verbalize understanding of skin care regimen Date Initiated: 03/06/2022 Target Resolution Date: 06/27/2022 Goal Status: Active Ulcer/skin breakdown will have a volume reduction of 30% by week 4 Date Initiated: 02/02/2022 Target Resolution Date: 06/27/2022 Goal Status: Active Interventions: Assess ulceration(s) every visit Provide education on ulcer and skin care Treatment Activities: Patient referred to home care : 02/02/2022 Skin care regimen initiated : 02/02/2022 Notes: Electronic Signature(s) Signed: 05/01/2022 4:19:23 PM By: Baruch Gouty RN, BSN Entered By: Baruch Gouty on 05/01/2022 07:46:57 -------------------------------------------------------------------------------- Pain Assessment Details Patient Name: Date of Service: Samuel Pigg MA R 05/01/2022 7:30 A M Medical Record Number: [id number] Patient Account Number: [id number] Date of Birth/Sex: Treating RN: 05-04-1965 (57 y.o. [name] Primary Care Caryssa Elzey: Nani Gasser Other Clinician: Referring Cambren Helm: Treating Jihad Brownlow/Extender: [place] in Treatment: 12  Active Problems Location of Pain Severity and Description of Pain Patient Has Paino No Site Locations Rate the pain. Current Pain Level: 0 Pain Management and Medication Current Pain Management: Electronic Signature(s) Signed: 05/01/2022 4:19:23 PM By: Baruch Gouty RN, BSN Entered By: Baruch Gouty on 05/01/2022 07:40:03 -------------------------------------------------------------------------------- Patient/Caregiver Education Details Patient Name: Date of Service: Samuel Pigg MA R 4/2/2024andnbsp7:30 A M Medical Record Number:  [id number] Patient Account Number: [id number] Date of Birth/Gender: Treating RN: Dec 08, 1965 (57 y.o. [name] Primary Care Physician: Nani Gasser Other Clinician: [name] ([id number]) [id number] Page 5 of 8 Referring Physician: Treating Physician/Extender: [place] in Treatment: 12 Education Assessment Education Provided To: Patient Education Topics Provided Offloading: Methods: Explain/Verbal Responses: Reinforcements needed, State content correctly Wound/Skin Impairment: Methods: Explain/Verbal Responses: Reinforcements needed, State content correctly Electronic Signature(s) Signed: 05/01/2022 4:19:23 PM By: Baruch Gouty RN, BSN Entered By: Baruch Gouty on 05/01/2022 07:47:18 -------------------------------------------------------------------------------- Wound Assessment Details Patient Name: Date of Service: Samuel Pigg MA R 05/01/2022 7:30 A M Medical Record Number: [id number] Patient Account Number: [id number] Date of Birth/Sex: Treating RN: February 16, 1965 (57 y.o. [name] Primary Care Vanshika Jastrzebski: Nani Gasser Other Clinician: Referring Loretta Doutt: Treating Marsena Taff/Extender: [place] in Treatment: 12 Wound Status Wound Number: 1 Primary Etiology: Diabetic Wound/Ulcer of the Lower Extremity Wound Location: Left Amputation Site - Transmetatarsal Wound Status: Open Wounding Event: Surgical Injury Comorbid Lymphedema, Hypertension, Type II Diabetes, Neuropathy History: Date Acquired: 02/01/2022 Weeks Of Treatment: 12 Clustered Wound: No Photos Wound Measurements Length: (cm) Width: (cm) Depth: (cm) Area: (cm) Volume: (cm) 0 % Reduction in Area: 100% 0 % Reduction in Volume: 100% 0 Epithelialization: Large (67-100%) 0 Tunneling: No 0 Undermining: No Wound Description Classification: Grade 1 Exudate Amount: None Present Foul Odor After  Cleansing: No Slough/Fibrino No Wound Bed Granulation Amount: None Present (0%) Exposed Structure Necrotic Amount: None Present (0%) Fascia Exposed: No Farooq, Geovanie ([id number] Page 6 of 8 Fat Layer (Subcutaneous Tissue) Exposed: No Tendon Exposed: No Muscle Exposed: No Joint Exposed: No Bone Exposed: No Periwound Skin Texture Texture Color No Abnormalities Noted: No No Abnormalities Noted: Yes Callus: Yes Temperature / Pain Temperature: No Abnormality Moisture No Abnormalities Noted: No Tenderness on Palpation: Yes Dry / Scaly: Yes Maceration: No Electronic Signature(s) Signed: 05/01/2022 4:19:23 PM By: Baruch Gouty RN, BSN Entered By: Baruch Gouty on 05/01/2022 07:45:32 -------------------------------------------------------------------------------- Wound Assessment Details Patient Name: Date of Service: Samuel Pigg MA R 05/01/2022 7:30 A M Medical Record Number: [id number] Patient Account Number: [id number] Date of Birth/Sex: Treating RN: 05/31/1965 (57 y.o. [name] Primary Care Osha Errico: Nani Gasser Other Clinician: Referring Kareema Keitt: Treating Joliyah Lippens/Extender: [place] in Treatment: 12 Wound Status Wound Number: 2 Primary Etiology: Diabetic Wound/Ulcer of the Lower Extremity Wound Location: Right Amputation Site - Transmetatarsal Wound Status: Open Wounding Event: Surgical Injury Comorbid Lymphedema, Hypertension, Type II Diabetes, Neuropathy History: Date Acquired: 10/30/2021 Weeks Of Treatment: 12 Clustered Wound: No Photos Wound Measurements Length: (cm) 1.2 Width: (cm) 0.1 Depth: (cm) 0.4 Area: (cm) 0.094 Volume: (cm) 0.038 % Reduction in Area: 99.2% % Reduction in Volume: 96.9% Epithelialization: Medium (34-66%) Tunneling: No Undermining: No Wound Description Classification: Grade 1 Wound Margin: Thickened Exudate Amount: Small Exudate Type:  Serosanguineous Exudate Color: red, brown Foul Odor After Cleansing: No Slough/Fibrino Yes Wound Bed Granulation Amount: Large (67-100%) Exposed Structure Granulation Quality: Red Fascia Exposed: No Necrotic Amount: Small (1-33%) Fat Layer (Subcutaneous Tissue) Exposed: Yes Necrotic Quality: Adherent  Slough Tendon Exposed: No Hickman, Royal ([id number]) [id number] Page 7 of 8 Muscle Exposed: No Joint Exposed: No Bone Exposed: No Periwound Skin Texture Texture Color No Abnormalities Noted: No No Abnormalities Noted: Yes Callus: Yes Temperature / Pain Temperature: No Abnormality Moisture No Abnormalities Noted: Yes Tenderness on Palpation: Yes Treatment Notes Wound #2 (Amputation Site - Transmetatarsal) Wound Laterality: Right Cleanser Soap and Water Discharge Instruction: May shower and wash wound with dial antibacterial soap and water prior to dressing change. Peri-Wound Care Sween Lotion (Moisturizing lotion) Discharge Instruction: Apply moisturizing lotion as directed Topical Primary Dressing Sorbalgon AG Dressing, 4x4 (in/in) Discharge Instruction: Apply to wound bed as instructed Secondary Dressing Optifoam Non-Adhesive Dressing, 4x4 in Discharge Instruction: Apply over primary dressing cut to make foam donut Woven Gauze Sponges 2x2 in Discharge Instruction: Apply over primary dressing as directed. Secured With Elastic Bandage 4 inch (ACE bandage) Discharge Instruction: Secure with ACE bandage as directed. Kerlix Roll Sterile, 4.5x3.1 (in/yd) Discharge Instruction: Secure with Kerlix as directed. 56M Medipore H Soft Cloth Surgical T ape, 4 x 10 (in/yd) Discharge Instruction: Secure with tape as directed. Compression Wrap Compression Stockings Add-Ons Electronic Signature(s) Signed: 05/01/2022 4:19:23 PM By: Baruch Gouty RN, BSN Entered By: Baruch Gouty on 05/01/2022  07:46:06 -------------------------------------------------------------------------------- Vitals Details Patient Name: Date of Service: Samuel Pigg MA R 05/01/2022 7:30 A M Medical Record Number: [id number] Patient Account Number: [id number] Date of Birth/Sex: Treating RN: 02-May-1965 (57 y.o. [name] Primary Care Creola Krotz: Nani Gasser Other Clinician: Referring Chinaza Rooke: Treating [place] Throckmorton/Extender: [place] in Treatment: 12 Vital Signs Time Taken: 07:38 Temperature (F): 99.2 Height (in): 72 Pulse (bpm): 80 Weight (lbs): 214 Respiratory Rate (breaths/min): 18 Body Mass Index (BMI): 29 Blood Pressure (mmHg): 152/81 Capillary Blood Glucose (mg/dl): 169 Reference Range: 80 - 120 mg / dl Trebilcock, Travor ([id number]) [phone].pdf Page 8 of 8 Notes glucose per pt report this morning Electronic Signature(s) Signed: 05/01/2022 4:19:23 PM By: Baruch Gouty RN, BSN Entered By: Baruch Gouty on 05/01/2022 07:39:55

## 2022-05-01 NOTE — Progress Notes (Signed)
Browning, [name] ([id number]) [id number] Page 1 of 8 Visit Report for 05/01/2022 Chief Complaint Document Details Patient Name: Date of Service: Samuel Pigg MA R 05/01/2022 7:30 A M Medical Record Number: [id number] Patient Account Number: [id number] Date of Birth/Sex: Treating RN: 03-15-1965 (57 y.o. 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 [name] Primary Care Provider Mention Primary Care Provider: Nani Gasser Other Clinician: Referring Provider: Treating Provider/Extender: San Morelle in Treatment: 12 Debridement Performed for Assessment: Wound #2 Right Amputation Site - Transmetatarsal Performed By: Physician Fredirick Maudlin, MD Debridement Type: Debridement Severity of Tissue Pre Debridement: Fat layer exposed Level of Consciousness (Pre-procedure): Awake and Alert Pre-procedure Verification/Time Out Yes - 07:50 Taken: Start Time: 07:55 Pain Control: Lidocaine 4% Topical Solution T Area Debrided (L x W): otal 2 (cm) x 1 (cm) = 2 (cm) Tissue and other material debrided: Non-Viable, Callus, Skin: Epidermis Level: Skin/Epidermis Debridement Description:  Selective/Open Wound Instrument: Curette Bleeding: Minimum Hemostasis Achieved: Pressure Procedural Pain: 1 Post Procedural Pain: 0 Response to Treatment: Procedure was tolerated well Level of Consciousness (Post- Awake and Alert procedure): Post Debridement Measurements of Total Wound Length: (cm) 1.2 Width: (cm) 0.1 Depth: (cm) 0.1 Volume: (cm) 0.009 Character of Wound/Ulcer Post Debridement: Improved Severity of Tissue Post Debridement: Fat layer exposed Post Procedure Diagnosis Same as Pre-procedure Notes Scribed for Dr. Celine Ahr by Baruch Gouty, RN Electronic Signature(s) [place], [place] ([id number]) [id number] Page 2 of 8 Signed: 05/01/2022 8:34:09 AM By: Fredirick Maudlin MD FACS Signed: 05/01/2022 4:19:23 PM By: Baruch Gouty RN, BSN Entered By: Baruch Gouty on 05/01/2022 08:01:01 -------------------------------------------------------------------------------- HPI Details Patient Name: Date of Service: Samuel Pigg MA R 05/01/2022 7:30 A M Medical Record Number: [id number] Patient Account Number: [id number] Date of Birth/Sex: Treating RN: 01/07/1966 (57 y.o. M) Primary Care Provider: Nani Gasser Other Clinician: Referring Provider: Treating Provider/Extender: San Morelle in Treatment: 12 History of Present Illness HPI Description: ADMISSION 02/02/2022 This is a 57 year old [place] man who speaks only Arabic. The visit today was conducted with the assistance of the language line interpreter; the patient declines the use of an in person interpreter. He is a poorly controlled diabetic (last hemoglobin A1c 10.8, but it has been as high as 15 in the past) with CKD stage III and hypertension. 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 He required revision of the right TMA in October. This was done in the [place] system. It  is not entirely clear how he was referred to our clinic however he has openings on the distal portion of the right foot as well as drainage coming from the plantar aspect of his left foot underneath some callus. The patient has just been applying dry gauze to both areas. 02/09/2022: The left-sided wound is quite a bit smaller today, but is covered with callus. The right sided wound has also contracted a little bit  and is very clean, but there is also callus accumulation around the perimeter. Unfortunately, due to his Medicaid status, we have been unable to secure home health assistance and he has been changing the dressings on his own. He also has difficulty obtaining dressing supplies. 02/19/2022: Both wounds are smaller, particularly on the right. The left is covered with a layer of callus. No concern for infection. 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 There is light slough on the surface with some surrounding callus. The left plantar foot wound has callused over. Underneath the callus, the there is a small opening in the skin. 03/06/2022: The wounds are about the same size this week. There is callus accumulation around each, but the surfaces are cleaner. 03/19/2022: The wound on the right is quite a bit smaller this week. There is minimal callus accumulation and light slough on the surface. The wound on the left is almost completely covered in callus. 03/26/2022: The wound on the right continues to contract and is narrower again this week. The wound on the left has become covered in callus once again. 04/24/2022: In the month since he was last seen, apparently the patient developed some purulent drainage from his foot and was seen by podiatry at [facility]. He was debrided and given antibiotics. Subsequently, his wounds actually look extremely good today. There is callus covering the plantar ulcer on the left. The right TMA site is quite a bit narrower and clean with  just some callus and slough accumulation. 05/01/2022: The wound on the plantar surface of his left foot is closed under a thick layer of callus. On the right, the wound is substantially smaller with just a little bit of slough but also heavy buildup of callus around the wound opening. Electronic Signature(s) Signed: 05/01/2022 8:08:23 AM By: Fredirick Maudlin MD FACS Entered By: Fredirick Maudlin on 05/01/2022 08:08:22 -------------------------------------------------------------------------------- Paring/cutting 1 benign hyperkeratotic lesion Details Patient Name: Date of Service: Samuel Pigg MA R 05/01/2022 7:30 A M Medical Record Number: [id number] Patient Account Number: [id number] Date of Birth/Sex: Treating RN: 1965/07/06 (57 y.o. [name] Primary Care Provider: Nani Gasser Other Clinician: Referring Provider: Treating Provider/Extender: San Morelle in Treatment: 12 Procedure Performed for: Wound #1 Left Amputation Site - Transmetatarsal Performed By: Physician Fredirick Maudlin, MD Post Procedure Diagnosis Same as Pre-procedure Notes using curette Electronic Signature(s) Signed: 05/01/2022 8:34:09 AM By: Fredirick Maudlin MD FACS Edcouch, [name] ([id number]) [id number] Page 3 of 8 Signed: 05/01/2022 4:19:23 PM By: Baruch Gouty RN, BSN Entered By: Baruch Gouty on 05/01/2022 07:55:49 -------------------------------------------------------------------------------- Physical Exam Details Patient Name: Date of Service: Samuel Pigg MA R 05/01/2022 7:30 A M Medical Record Number: [id number] Patient Account Number: [id number] Date of Birth/Sex: Treating RN: [birth year] (57 y.o. M) Primary Care Provider: Nani Gasser Other Clinician: Referring Provider: Treating Provider/Extender: San Morelle in Treatment: 12 Constitutional Hypertensive, asymptomatic. . . . no acute  distress. Respiratory Normal work of breathing on room air. Notes 05/01/2022: The wound on the plantar surface of his left foot is closed under a thick layer of callus. On the right, the wound is substantially smaller with just a little bit of slough but also heavy buildup of callus around the wound opening. Electronic Signature(s) Signed: 05/01/2022 8:09:30 AM By: Fredirick Maudlin MD FACS Entered By: Fredirick Maudlin on 05/01/2022 08:09:30 -------------------------------------------------------------------------------- Physician Orders Details Patient Name: Date of Service: Samuel Pigg MA R 05/01/2022 7:30 A M Medical Record Number: [id number]  Patient Account Number: [id number] Date of Birth/Sex: Treating RN: 04-25-1965 (57 y.o. 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 - Dr. Celine Ahr Room 1 +++ INTERPRETER [facility] Return A Other: - Doctor's note for spouse for work [employer] May shower and wash wound with soap and water. Off-Loading Wound #2 Right Amputation Site - Transmetatarsal DH Walker Boot to: - right foot Other: - callous cushions both feet to protect Non Wound Condition pply the following to affected area as directed: - urea cream 40% to callouses daily (do not put on open areas) A Wound Treatment Wound #2 - Amputation Site - Transmetatarsal Wound Laterality: Right Cleanser: Soap and Water 1 x Per Day/30  Days Discharge Instructions: May shower and wash wound with dial antibacterial soap and water prior to dressing change. Peri-Wound Care: Sween Lotion (Moisturizing lotion) 1 x Per Day/30 Days Discharge Instructions: Apply moisturizing lotion as directed AUSTINMICHAEL, MICHELIN ([id number]) [id number] Page 4 of 8 Prim Dressing: Sorbalgon [employer], 4x4 (in/in) 1 x Per Day/30 Days ary Discharge Instructions: Apply to wound bed as instructed Secondary Dressing: Optifoam Non-Adhesive Dressing, 4x4 in 1 x Per Day/30 Days Discharge Instructions: Apply over primary dressing cut to make foam donut Secondary Dressing: Woven Gauze Sponges 2x2 in 1 x Per Day/30 Days Discharge Instructions: Apply over primary dressing as directed. Secured With: Elastic Bandage 4 inch (ACE bandage) 1 x Per Day/30 Days Discharge Instructions: Secure with ACE bandage as directed. Secured With: [employer], 4.5x3.1 (in/yd) 1 x Per Day/30 Days Discharge Instructions: Secure with Kerlix as directed. Secured With: 44M Medipore H Soft Cloth Surgical T ape, 4 x 10 (in/yd) 1 x Per Day/30 Days Discharge Instructions: Secure with tape as directed. Electronic Signature(s) Signed: 05/01/2022 8:34:09 AM By: Fredirick Maudlin MD FACS Entered By: Fredirick Maudlin on 05/01/2022 08:09:43 -------------------------------------------------------------------------------- Problem List Details Patient Name: Date of Service: Samuel Pigg MA R 05/01/2022 7:30 A M Medical Record Number: [id number] Patient Account Number: [id number] Date of Birth/Sex: Treating RN: Jun 04, 1965 (57 y.o. [name] Primary Care Provider: Nani Gasser Other Clinician: Referring Provider: Treating Provider/Extender: San Morelle in Treatment: 12 Active Problems ICD-10 Encounter Code Description Active Date MDM Diagnosis L97.515 Non-pressure chronic ulcer of other part of right foot with muscle  involvement 02/02/2022 No Yes without evidence of necrosis L97.522 Non-pressure chronic ulcer of other part of left foot with fat layer exposed 02/02/2022 No Yes E11.621 Type 2 diabetes mellitus with foot ulcer 02/02/2022 No Yes Z89.439 Acquired absence of unspecified foot 02/02/2022 No Yes Inactive Problems Resolved Problems Electronic Signature(s) Signed: 05/01/2022 8:06:52 AM By: Fredirick Maudlin MD FACS Entered By: Fredirick Maudlin on 05/01/2022 08:06:52 -------------------------------------------------------------------------------- Progress Note Details Patient Name: Date of Service: Samuel Pigg MA R 05/01/2022 7:30 A Heloise Beecham, [name] ([id number]) [id number] Page 5 of 8 Medical Record Number: [id number] Patient Account Number: [id number] Date of Birth/Sex: Treating RN: 10-31-1965 (57 y.o. 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 The visit today was conducted with the assistance of the language line interpreter; the patient declines the use of an in person interpreter. He is a poorly controlled diabetic (last hemoglobin A1c 10.8, but it has been as high as 15 in the past) with CKD stage III and hypertension. 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 He required revision of the right TMA in October. This was done in the [place] system. It is not entirely clear how he was referred to  our clinic however he has openings on the distal portion of the right foot as well as drainage coming from the plantar aspect of his left foot underneath some callus. The patient has just been applying dry gauze to both areas. 02/09/2022: The left-sided wound is quite a bit smaller today, but is covered with callus. The right sided wound has also contracted a little bit and is very clean, but there is also callus accumulation around the perimeter. Unfortunately, due to his Medicaid status, we have been unable to secure home health assistance and he has been changing the dressings on his own. He also has difficulty obtaining dressing supplies. 02/19/2022: Both wounds are smaller, particularly on the right. The left is covered with a layer of callus. No concern for infection. 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 There is light slough on the surface with some surrounding callus. The left plantar foot wound has callused over. Underneath the callus, the there is a small opening in the skin. 03/06/2022: The wounds are about the same size this week. There is callus accumulation around each, but the surfaces are cleaner. 03/19/2022: The wound on the right is quite a bit smaller this week. There is minimal callus accumulation and light slough on the surface. The wound on the left is almost completely covered in callus. 03/26/2022: The wound on the right continues to contract and is narrower again this week. The wound on the left has become covered in callus once again. 04/24/2022: In the month since he was last seen, apparently the patient developed some purulent drainage from his foot and was seen by podiatry at [facility]. He was debrided and given antibiotics. Subsequently, his wounds actually look extremely good today. There is callus covering the plantar ulcer on the left. The right TMA site is quite a bit narrower and clean with just some callus and slough  accumulation. 05/01/2022: The wound on the plantar surface of his left foot is closed under a thick layer of callus. On the right, the wound is substantially smaller with just a little bit of slough but also heavy buildup of callus around the wound opening. Patient History Family History Unknown History, Cancer, No family history of Diabetes, Heart Disease, Hereditary Spherocytosis, Hypertension, Kidney Disease, Lung Disease, Seizures, Stroke, Thyroid Problems, Tuberculosis. Social History Never smoker, Marital Status - Married, Alcohol Use - Never, Drug Use - No History, Caffeine Use - Rarely. Medical History Hematologic/Lymphatic Patient has history of Lymphedema Cardiovascular Patient has history of Hypertension Endocrine Patient has history of Type II Diabetes Neurologic Patient has history of Neuropathy Medical A Surgical History Notes nd Genitourinary AKI Objective Constitutional Hypertensive, asymptomatic. no  acute distress. Vitals Time Taken: 7:38 AM, Height: 72 in, Weight: 214 lbs, BMI: 29, Temperature: 99.2 F, Pulse: 80 bpm, Respiratory Rate: 18 breaths/min, Blood Pressure: 152/81 mmHg, Capillary Blood Glucose: 169 mg/dl. Berkley, [name] ([id number]) [id number] Page 6 of 8 General Notes: glucose per pt report this morning Respiratory Normal work of breathing on room air. General Notes: 05/01/2022: The wound on the plantar surface of his left foot is closed under a thick layer of callus. On the right, the wound is substantially smaller with just a little bit of slough but also heavy buildup of callus around the wound opening. Integumentary (Hair, Skin) Wound #1 status is Open. Original cause of wound was Surgical Injury. The date acquired was: 02/01/2022. The wound has been in treatment 12 weeks. The wound is located on the Left Amputation Site - Transmetatarsal. The wound measures 0cm length x 0cm width x 0cm depth; 0cm^2 area and 0cm^3 volume. There  is no tunneling or undermining noted. There is a none present amount of drainage noted. There is no granulation within the wound bed. There is no necrotic tissue within the wound bed. The periwound skin appearance had no abnormalities noted for color. The periwound skin appearance exhibited: Callus, Dry/Scaly. The periwound skin appearance did not exhibit: Maceration. Periwound temperature was noted as No Abnormality. The periwound has tenderness on palpation. Wound #2 status is Open. Original cause of wound was Surgical Injury. The date acquired was: 10/30/2021. The wound has been in treatment 12 weeks. The wound is located on the Right Amputation Site - Transmetatarsal. The wound measures 1.2cm length x 0.1cm width x 0.4cm depth; 0.094cm^2 area and 0.038cm^3 volume. There is Fat Layer (Subcutaneous Tissue) exposed. There is no tunneling or undermining noted. There is a small amount of serosanguineous drainage noted. The wound margin is thickened. There is large (67-100%) red granulation within the wound bed. There is a small (1-33%) amount of necrotic tissue within the wound bed including Adherent Slough. The periwound skin appearance had no abnormalities noted for moisture. The periwound skin appearance had no abnormalities noted for color. The periwound skin appearance exhibited: Callus. Periwound temperature was noted as No Abnormality. The periwound has tenderness on palpation. 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 There  was a Selective/Open Wound Skin/Epidermis Debridement with a total area of 2 sq cm performed by Fredirick Maudlin, MD. With the following instrument(s): Curette to remove Non-Viable tissue/material. Material removed includes Callus and Skin: Epidermis and after achieving pain control using Lidocaine 4% Topical Solution. No specimens were taken. A time out was conducted at 07:50, prior to the start of the procedure. A Minimum amount of bleeding was controlled with Pressure. The procedure was tolerated well with a pain level of 1 throughout and a pain level of 0 following the procedure. Post Debridement Measurements: 1.2cm length x 0.1cm width x 0.1cm depth; 0.009cm^3 volume. Character of Wound/Ulcer Post Debridement is improved. Severity of Tissue Post Debridement is: Fat layer exposed. Post procedure Diagnosis Wound #2: Same as Pre-Procedure General Notes: Scribed for Dr. Celine Ahr by Baruch Gouty, RN. Wound #1 Pre-procedure diagnosis  of Wound #1 is a Diabetic Wound/Ulcer of the Lower Extremity located on the Left Amputation Site - Transmetatarsal . An Paring/cutting 1 benign hyperkeratotic lesion procedure was performed by Fredirick Maudlin, MD. Post procedure Diagnosis Wound #1: Same as Pre-Procedure Notes: using curette Plan Follow-up Appointments: Return Appointment in 1 week. - Dr. Celine Ahr Room 1 +++ INTERPRETER [facility] Other: - Doctor's note for spouse for work Midwife Hygiene: May shower and wash wound with soap and water. Off-Loading: Wound #2 Right Amputation Site - Transmetatarsal: DH Walker Boot to: - right foot Other: - callous cushions both feet to protect Non Wound Condition: Apply the following to affected area as directed: - urea cream 40% to callouses daily (do not put on open areas) WOUND #2: - Amputation Site - Transmetatarsal Wound Laterality: Right Cleanser: Soap and Water 1 x Per Day/30 Days Discharge Instructions: May shower and wash wound with dial  antibacterial soap and water prior to dressing change. Peri-Wound Care: Sween Lotion (Moisturizing lotion) 1 x Per Day/30 Days Discharge Instructions: Apply moisturizing lotion as directed Prim Dressing: Sorbalgon [employer] Dressing, 4x4 (in/in) 1 x Per Day/30 Days ary Discharge Instructions: Apply to wound bed as instructed Secondary Dressing: Optifoam Non-Adhesive Dressing, 4x4 in 1 x Per Day/30 Days Discharge Instructions: Apply over primary dressing cut to make foam donut Aplin, Lorence ([id number]) [phone].pdf Page 7 of 8 Secondary Dressing: Woven Gauze Sponges 2x2 in 1 x Per Day/30 Days Discharge Instructions: Apply over primary dressing as directed. Secured With: Elastic Bandage 4 inch (ACE bandage) 1 x Per Day/30 Days Discharge Instructions: Secure with ACE bandage as directed. Secured With: [employer], 4.5x3.1 (in/yd) 1 x Per Day/30 Days Discharge Instructions: Secure with Kerlix as directed. Secured With: 78M Medipore H Soft Cloth Surgical T ape, 4 x 10 (in/yd) 1 x Per Day/30 Days Discharge Instructions: Secure with tape as directed. 05/01/2022: The wound on the plantar surface of his left foot is closed under a thick layer of callus. On the right, the wound is substantially smaller with just a little bit of slough but also heavy buildup of callus around the wound opening. I used a curette to debride the callus away from the plantar surface of his left foot to confirm that the wound was actually closed. No opening was appreciated. I debrided callus and slough from the right foot ulcer. I recommended to the patient and his wife that they apply 40% urea cream and use a thick callus pads to protect his left foot from reopening. We will continue silver alginate to the right foot ulcer and also use 40% urea cream to the periwound to try and keep the skin supple. Follow-up in 1 week. Electronic Signature(s) Signed: 05/01/2022 8:13:37 AM By: Fredirick Maudlin MD  FACS Entered By: Fredirick Maudlin on 05/01/2022 08:13:37 -------------------------------------------------------------------------------- HxROS Details Patient Name: Date of Service: Samuel Pigg MA R 05/01/2022 7:30 A M Medical Record Number: [id number] Patient Account Number: [id number] Date of Birth/Sex: Treating RN: 1965/06/03 (57 y.o. M) Primary Care Provider: Nani Gasser Other Clinician: Referring Provider: Treating Provider/Extender: San Morelle in Treatment: 12 Hematologic/Lymphatic Medical History: Positive for: Lymphedema Cardiovascular Medical History: Positive for: Hypertension Endocrine Medical History: Positive for: Type II Diabetes Treated with: Insulin, Oral agents Genitourinary Medical History: Past Medical History Notes: AKI Neurologic Medical History: Positive for: Neuropathy Immunizations Pneumococcal Vaccine: Received Pneumococcal Vaccination: Yes Received Pneumococcal Vaccination On or After 60th Birthday: Yes Implantable Devices No devices added Family and Social History Unknown History: Yes;  Cancer: Yes; Diabetes: No; Heart Disease: No; Hereditary Spherocytosis: No; Hypertension: No; Kidney Disease: No; Lung Disease: No; Seizures: No; Stroke: No; Thyroid Problems: No; Tuberculosis: No; Never smoker; Marital Status - Married; Alcohol Use: Never; Drug Use: No History; Caffeine Use: Rarely; Financial Concerns: No; Food, Clothing or Shelter Needs: No; Support System Lacking: No; Transportation Concerns: No [place], [name] ([id number]) [id number] Page 8 of 8 Electronic Signature(s) Signed: 05/01/2022 8:34:09 AM By: Fredirick Maudlin MD FACS Entered By: Fredirick Maudlin on 05/01/2022 08:09:03 -------------------------------------------------------------------------------- SuperBill Details Patient Name: Date of Service: Samuel Pigg MA R 05/01/2022 Medical Record Number: [id number] Patient Account Number:  [id number] Date of Birth/Sex: Treating RN: March 09, 1965 (57 y.o. 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 Type 2 diabetes mellitus with foot ulcer Z89.439 Acquired absence of unspecified foot Facility Procedures : CPT4 Code: [id number] Description: [id number] - DEBRIDE WOUND 1ST 20 SQ CM OR < ICD-10 Diagnosis Description 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 wit Modifier: hout evidence of n Quantity: 1 ecrosis : CPT4 Code: [id number] Description: 11055 - PARE BENIGN LES; SGL ICD-10 Diagnosis Description L97.522 Non-pressure chronic ulcer of other part of left foot with fat layer exposed Modifier: Quantity: 1 Physician Procedures : CPT4 Code Description Modifier [id number] - WC PHYS LEVEL 3 - EST PT 25 ICD-10 Diagnosis Description 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 without evidence of ne L97.522 Non-pressure chronic ulcer of  other part of left foot with fat layer exposed E11.621 Type 2 diabetes mellitus with foot ulcer Z89.439 Acquired absence of unspecified foot Quantity: 1 crosis : [id number] - WC PHYS DEBR WO ANESTH 20 SQ CM ICD-10 Diagnosis Description 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

## 2022-05-02 ENCOUNTER — Encounter: Payer: Self-pay | Admitting: Dietician

## 2022-05-02 NOTE — Progress Notes (Signed)
[facility] Attending  Case discussed with Dr. White  at the time of the visit.  We reviewed the resident's history and exam and pertinent patient test results.  I agree with the assessment, diagnosis, and plan of care documented in the resident's note.  

## 2022-05-04 ENCOUNTER — Other Ambulatory Visit (HOSPITAL_COMMUNITY): Payer: Self-pay

## 2022-05-04 MED ORDER — MUPIROCIN 2 % EX OINT
TOPICAL_OINTMENT | Freq: Every day | CUTANEOUS | 1 refills | Status: AC
  Filled 2022-05-04: qty 22, 7d supply, fill #0

## 2022-05-08 ENCOUNTER — Encounter (HOSPITAL_BASED_OUTPATIENT_CLINIC_OR_DEPARTMENT_OTHER): Payer: Medicaid Other | Admitting: General Surgery

## 2022-05-08 DIAGNOSIS — E11621 Type 2 diabetes mellitus with foot ulcer: Secondary | ICD-10-CM | POA: Diagnosis not present

## 2022-05-16 ENCOUNTER — Ambulatory Visit (HOSPITAL_BASED_OUTPATIENT_CLINIC_OR_DEPARTMENT_OTHER): Payer: Medicaid Other | Admitting: General Surgery

## 2022-05-17 NOTE — Progress Notes (Signed)
Nibley, [name] ([id number]) [id number] Page 1 of 8 Visit Report for 05/08/2022 Chief Complaint Document Details Patient Name: Date of Service: [name] 05/08/2022 7:30 A M Medical Record Number: [id number] Patient Account Number: [id number] Date of Birth/Sex: Treating RN: 1965-06-15 (57 y.o. 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 [name] Primary Care Provider: Marrianne Mood Other Clinician: Referring Provider: Treating Provider/Extender: Alena Bills in Treatment: 13 Debridement Performed for Assessment: Wound #2 Right Amputation Site - Transmetatarsal Performed By: Physician Duanne Guess, MD Debridement Type: Debridement Severity of Tissue Pre Debridement: Fat layer exposed Level of Consciousness (Pre-procedure): Awake and Alert Pre-procedure Verification/Time Out No Taken: Pain Control: Lidocaine 4% T opical Solution T Area Debrided (L x W): otal 1 (cm) x 0.1 (cm) = 0.1 (cm) Tissue and other material debrided: Callus, Subcutaneous Level: Skin/Subcutaneous Tissue Debridement Description: Excisional Instrument:  Curette Bleeding: Minimum Hemostasis Achieved: Pressure Procedural Pain: 0 Post Procedural Pain: 0 Response to Treatment: Procedure was tolerated well Level of Consciousness (Post- Awake and Alert procedure): Post Debridement Measurements of Total Wound Length: (cm) 1 Width: (cm) 0.1 Depth: (cm) 0.1 Volume: (cm) 0.008 Character of Wound/Ulcer Post Debridement: Improved Severity of Tissue Post Debridement: Fat layer exposed Post Procedure Diagnosis Same as Pre-procedure Notes Scribed for Dr. Lady Gary by Brenton Grills RN. Electronic Signature(s) Signed: 05/08/2022 12:21:18 PM By: Duanne Guess MD FACS Signed: 05/17/2022 1:56:54 PM By: Lilly Cove, [name] ([id number]) [id number] Page 2 of 8 Signed: 05/17/2022 1:56:54 PM By: Brenton Grills Entered By: Brenton Grills on 05/08/2022 08:03:24 -------------------------------------------------------------------------------- HPI Details Patient Name: Date of Service: [name] 05/08/2022 7:30 A M Medical Record Number: [id number] Patient Account Number: [id number] Date of Birth/Sex: Treating RN: August 01, 1965 (57 y.o. M) Primary Care Provider: Marrianne Mood Other Clinician: Referring Provider: Treating Provider/Extender: Alena Bills in Treatment: 13 History of Present Illness HPI Description: ADMISSION 02/02/2022 This is a 57 year old [place] man who speaks only Arabic. The visit today was conducted with the assistance of the language line interpreter; the patient declines the use of an in person interpreter. He is a poorly controlled diabetic (last hemoglobin A1c 10.8, but it has been as high as 15 in the past) with CKD stage III and hypertension. 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 He required revision of the right TMA in October. This was done in the [facility] system.  It is not entirely clear how he was referred to our clinic however he has openings on the distal portion of the right foot as well as drainage coming from the plantar aspect of his left foot underneath some callus. The patient has just been applying dry gauze to both areas. 02/09/2022: The left-sided wound is quite a bit smaller today, but is covered with callus. The right sided wound has also contracted a little bit and  is very clean, but there is also callus accumulation around the perimeter. Unfortunately, due to his Medicaid status, we have been unable to secure home health assistance and he has been changing the dressings on his own. He also has difficulty obtaining dressing supplies. 02/19/2022: Both wounds are smaller, particularly on the right. The left is covered with a layer of callus. No concern for infection. 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 There is light slough on the surface with some surrounding callus. The left plantar foot wound has callused over. Underneath the callus, the there is a small opening in the skin. 03/06/2022: The wounds are about the same size this week. There is callus accumulation around each, but the surfaces are cleaner. 03/19/2022: The wound on the right is quite a bit smaller this week. There is minimal callus accumulation and light slough on the surface. The wound on the left is almost completely covered in callus. 03/26/2022: The wound on the right continues to contract and is narrower again this week. The wound on the left has become covered in callus once again. 04/24/2022: In the month since he was last seen, apparently the patient developed some purulent drainage from his foot and was seen by podiatry at [facility]. He was debrided and given antibiotics. Subsequently, his wounds actually look extremely good today. There is callus covering the plantar ulcer on the left. The right TMA site is quite a bit narrower and clean with  just some callus and slough accumulation. 05/01/2022: The wound on the plantar surface of his left foot is closed under a thick layer of callus. On the right, the wound is substantially smaller with just a little bit of slough but also heavy buildup of callus around the wound opening. 05/08/2022: The left plantar foot ulcer remains closed. On the right, the wound continues to narrow significantly. There is still some depth to the wound, but it is clean. Electronic Signature(s) Signed: 05/08/2022 8:05:14 AM By: Duanne Guess MD FACS Entered By: Duanne Guess on 05/08/2022 08:05:14 -------------------------------------------------------------------------------- Physical Exam Details Patient Name: Date of Service: [name] 05/08/2022 7:30 A M Medical Record Number: [id number] Patient Account Number: [id number] Date of Birth/Sex: Treating RN: [birth year] (57 y.o. M) Primary Care Provider: Marrianne Mood Other Clinician: Referring Provider: Treating Provider/Extender: Alena Bills in Treatment: 13 Constitutional Slightly hypertensive. . . . no acute distress. Respiratory Normal work of breathing on room air. Notes 05/08/2022: The left plantar foot ulcer remains closed. On the right, the wound continues to narrow significantly. There is still some depth to the wound, but it is clean. [name], [name] ([id number]) [id number] Page 3 of 8 Electronic Signature(s) Signed: 05/08/2022 8:06:02 AM By: Duanne Guess MD FACS Entered By: Duanne Guess on 05/08/2022 08:06:02 -------------------------------------------------------------------------------- Physician Orders Details Patient Name: Date of Service: [name] 05/08/2022 7:30 A M Medical Record Number: [id number] Patient Account Number: [id number] Date of Birth/Sex: Treating RN: 1965-11-10 (57 y.o. 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 - Dr. Lady Gary  Room 1 +++ INTERPRETER [facility] Return A Other: - Doctor's note for spouse for work [employer] May shower and wash wound with soap and water. Off-Loading Wound #2 Right Amputation Site - Transmetatarsal DH Walker Boot to: - right foot Other: - callous cushions both feet to protect Non Wound Condition pply the following to affected area as directed: - urea cream 40% to callouses daily (do not put on open areas) A Wound Treatment Wound #2 - Amputation Site - Transmetatarsal Wound Laterality: Right Cleanser: Soap and Water 1 x Per Day/30 Days Discharge Instructions: May shower and wash wound with dial antibacterial soap and water prior to dressing change. Peri-Wound Care: Sween Lotion (Moisturizing lotion) 1 x Per Day/30 Days Discharge Instructions: Apply moisturizing lotion as directed Prim Dressing: Sorbalgon AG Dressing, 4x4 (in/in) 1 x Per Day/30 Days ary Discharge Instructions: Apply to wound bed as instructed Secondary Dressing: Optifoam Non-Adhesive Dressing, 4x4 in 1 x Per Day/30 Days Discharge Instructions: Apply over primary dressing cut to make foam donut Secondary Dressing: Woven Gauze Sponges 2x2 in 1 x Per Day/30 Days Discharge Instructions: Apply over primary dressing as directed. Secured With: Elastic Bandage 4 inch (ACE bandage) 1 x Per Day/30 Days Discharge Instructions: Secure with ACE bandage as  directed. Secured With: [employer], 4.5x3.1 (in/yd) 1 x Per Day/30 Days Discharge Instructions: Secure with Kerlix as directed. Secured With: 27M Medipore H Soft Cloth Surgical T ape, 4 x 10 (in/yd) 1 x Per Day/30 Days Discharge Instructions: Secure with tape as directed. Electronic Signature(s) Signed: 05/08/2022 12:21:18 PM By: Duanne Guess MD FACS Signed: 05/17/2022 1:56:54 PM By: Brenton Grills Entered By: Brenton Grills on 05/08/2022 08:44:13 [name] ([id number]) [id number] Page 4 of 8 -------------------------------------------------------------------------------- Problem List Details Patient Name: Date of Service: [name] 05/08/2022 7:30 A M Medical Record Number: [id number] Patient Account Number: [id number] Date of Birth/Sex: Treating RN: 10-13-1965 (57 y.o. 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 02/02/2022 No Yes without evidence of necrosis L97.522 Non-pressure chronic ulcer of other part of left foot with fat layer exposed 02/02/2022 No Yes E11.621 Type 2 diabetes mellitus with foot ulcer 02/02/2022 No Yes Z89.439 Acquired absence of unspecified foot 02/02/2022 No Yes Inactive Problems Resolved Problems Electronic Signature(s) Signed: 05/08/2022 12:21:18 PM By: Duanne Guess MD FACS Signed: 05/17/2022 1:56:54 PM By: Brenton Grills Previous Signature: 05/08/2022 8:04:27 AM Version By: Duanne Guess MD FACS Entered By: Brenton Grills on 05/08/2022 08:04:58 -------------------------------------------------------------------------------- Progress Note Details Patient Name: Date of Service: [name] 05/08/2022 7:30 A M Medical Record  Number: [id number] Patient Account Number: [id number] Date of Birth/Sex: Treating RN: 1965-10-05 (57 y.o. 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 The visit today was conducted with the assistance of the language line interpreter; the patient declines the use of an in person interpreter. He is a poorly controlled diabetic (last hemoglobin A1c 10.8, but it  has been as high as 15 in the past) with CKD stage III and hypertension. 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 He required revision of the right TMA in October. This was done in the [facility] system. It is not entirely clear how he was referred to our clinic however he has openings on the distal portion of the right foot as well as drainage coming from the plantar aspect of his left foot underneath some callus. The patient has just been applying dry gauze to both areas. 02/09/2022: The left-sided wound is quite a bit smaller today, but is covered with callus. The right sided wound has also contracted a little bit and is very clean, but there is also callus accumulation around the perimeter. Unfortunately, due to his Medicaid status, we have been unable to secure home health assistance [place], [place] ([id number]) [id number] Page 5 of 8 and he has been changing the dressings on his own. He also has difficulty obtaining dressing  supplies. 02/19/2022: Both wounds are smaller, particularly on the right. The left is covered with a layer of callus. No concern for infection. 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 There is light slough on the surface with some surrounding callus. The left plantar foot wound has callused over. Underneath the callus, the there is a small opening in the skin. 03/06/2022: The wounds are about the same size this week. There is callus accumulation around each, but the surfaces are cleaner. 03/19/2022: The wound on the right is quite a bit smaller this week. There is minimal callus accumulation and light slough on the surface. The wound on the left is almost completely covered in callus. 03/26/2022: The wound on the right continues to contract and is narrower again this week. The wound on the left has become covered in callus once again. 04/24/2022: In the month since he was last seen, apparently the patient developed some purulent drainage from his foot and was seen by podiatry at [facility]. He was debrided and given antibiotics. Subsequently, his wounds actually look extremely good today. There is callus covering the plantar ulcer on the left. The right TMA site is quite a bit narrower and clean with just some callus and slough accumulation. 05/01/2022: The wound on the plantar surface of his left foot is closed under a thick layer of callus. On the right, the wound is substantially smaller with just a little bit of slough but also heavy buildup of callus around the wound opening. 05/08/2022: The left plantar foot ulcer remains closed. On the right, the wound continues to narrow significantly. There is still some depth to the wound, but it is clean. Patient History Family History Unknown History, Cancer, No family history of Diabetes, Heart Disease, Hereditary Spherocytosis, Hypertension, Kidney Disease, Lung Disease, Seizures, Stroke, Thyroid Problems, Tuberculosis. Social  History Never smoker, Marital Status - Married, Alcohol Use - Never, Drug Use - No History, Caffeine Use - Rarely. Medical History Hematologic/Lymphatic Patient has history of Lymphedema Cardiovascular Patient has history of Hypertension Endocrine Patient has history of Type II Diabetes Neurologic Patient has history of Neuropathy Medical A Surgical History Notes nd Genitourinary AKI Objective Constitutional Slightly hypertensive. no acute distress. Vitals Time Taken: 7:44 AM, Height: 72 in, Weight: 214 lbs, BMI: 29, Temperature: 97.8 F, Pulse: 71 bpm, Respiratory Rate: 18 breaths/min, Blood Pressure: 146/80 mmHg, Capillary Blood Glucose: 161 mg/dl. Respiratory Normal work of breathing on room air. General Notes: 05/08/2022: The left  plantar foot ulcer remains closed. On the right, the wound continues to narrow significantly. There is still some depth to the wound, but it is clean. Integumentary (Hair, Skin) Wound #2 status is Open. Original cause of wound was Surgical Injury. The date acquired was: 10/30/2021. The wound has been in treatment 13 weeks. The wound is located on the Right Amputation Site - Transmetatarsal. The wound measures 1cm length x 0.1cm width x 0.1cm depth; 0.079cm^2 area and 0.008cm^3 volume. There is Fat Layer (Subcutaneous Tissue) exposed. There is no tunneling or undermining noted. There is a none present amount of drainage noted. The wound margin is distinct with the outline attached to the wound base. There is medium (34-66%) pink granulation within the wound bed. There is a medium (34-66%) amount of necrotic tissue within the wound bed including Adherent Slough. The periwound skin appearance had no abnormalities noted for moisture. The periwound skin appearance had no abnormalities noted for color. The periwound skin appearance exhibited: Callus. Periwound temperature was noted as No Abnormality. 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 There was a Excisional Skin/Subcutaneous Tissue Debridement with a total area of 0.1 sq cm performed by Duanne Guess, MD. With the following instrument(s): Curette Material removed includes Callus and Subcutaneous Tissue and after achieving pain control using Lidocaine 4% T opical Solution. No specimens were taken.A Minimum amount of bleeding was controlled with Pressure. The procedure was tolerated well with a pain level of 0 throughout and a pain level of 0 following the procedure. Post Debridement Measurements: 1cm length x 0.1cm width x 0.1cm depth; 0.008cm^3 volume. Character of Wound/Ulcer Post Debridement is improved. Severity of Tissue Post Debridement is: Fat layer exposed. Post procedure Diagnosis Wound #2: Same as Pre-Procedure General Notes: Scribed for Dr. Lady Gary by Brenton Grills RN.Marland Kitchen Plan Follow-up Appointments: Return Appointment in 1 week. - Dr. 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 Off-Loading: Wound #2 Right Amputation Site - Transmetatarsal: DH Walker Boot to: - right foot Other: - callous cushions both feet to protect Non Wound Condition: Apply the following to affected area as directed: - urea cream 40% to callouses daily (do not put on open areas) WOUND #2: - Amputation Site - Transmetatarsal Wound Laterality:  Right Cleanser: Soap and Water 1 x Per Day/30 Days Discharge Instructions: May shower and wash wound with dial antibacterial soap and water prior to dressing change. Peri-Wound Care: Sween Lotion (Moisturizing lotion) 1 x Per Day/30 Days Discharge Instructions: Apply moisturizing lotion as directed Prim Dressing: Sorbalgon AG Dressing, 4x4 (in/in) 1 x Per Day/30 Days ary Discharge Instructions: Apply to wound bed as instructed Secondary Dressing: Optifoam Non-Adhesive Dressing, 4x4 in 1 x Per Day/30 Days Discharge Instructions: Apply over primary dressing cut to make foam donut Secondary Dressing: Woven Gauze Sponges 2x2 in 1 x Per Day/30 Days Discharge Instructions: Apply over primary dressing as directed. Secured With: Elastic Bandage 4 inch (ACE bandage) 1 x Per Day/30  Days Discharge Instructions: Secure with ACE bandage as directed. Secured With: [employer], 4.5x3.1 (in/yd) 1 x Per Day/30 Days Discharge Instructions: Secure with Kerlix as directed. Secured With: 60M Medipore H Soft Cloth Surgical T ape, 4 x 10 (in/yd) 1 x Per Day/30 Days Discharge Instructions: Secure with tape as directed. 05/08/2022: The left plantar foot ulcer remains closed. On the right, the wound continues to narrow significantly. There is still some depth to the wound, but it is clean. I used a curette to debride callus and subcutaneous tissue from the right TMA site. Due to the remaining depth of the wound, he will need to pack the silver alginate into the site, but he is doing an excellent job and the wound is healing nicely. He will follow-up in 1 week. Electronic Signature(s) Signed: 05/08/2022 12:21:18 PM By: Duanne Guess MD FACS Signed: 05/17/2022 1:56:54 PM By: Brenton Grills Previous Signature: 05/08/2022 8:07:36 AM Version By: Duanne Guess MD FACS Entered By: Brenton Grills on 05/08/2022 08:46:33 -------------------------------------------------------------------------------- HxROS  Details Patient Name: Date of Service: [name] 05/08/2022 7:30 A M Medical Record Number: [id number] Patient Account Number: [id number] Date of Birth/Sex: Treating RN: January 15, 1966 (57 y.o. M) Primary Care Provider: Marrianne Mood Other Clinician: Isaac Hernandez ([id number]) [id number] Page 7 of 8 Referring Provider: Treating Provider/Extender: Alena Bills in Treatment: 13 Hematologic/Lymphatic Medical History: Positive for: Lymphedema Cardiovascular Medical History: Positive for: Hypertension Endocrine Medical History: Positive for: Type II Diabetes Treated with: Insulin, Oral agents Genitourinary Medical History: Past Medical History Notes: AKI Neurologic Medical History: Positive for: Neuropathy Immunizations Pneumococcal Vaccine: Received Pneumococcal Vaccination: Yes Received Pneumococcal Vaccination On or After 60th Birthday: Yes Implantable Devices No devices added Family and Social History Unknown History: Yes; Cancer: Yes; Diabetes: No; Heart Disease: No; Hereditary Spherocytosis: No; Hypertension: No; Kidney Disease: No; Lung Disease: No; Seizures: No; Stroke: No; Thyroid Problems: No; Tuberculosis: No; Never smoker; Marital Status - Married; Alcohol Use: Never; Drug Use: No History; Caffeine Use: Rarely; Financial Concerns: No; Food, Clothing or Shelter Needs: No; Support System Lacking: No; Transportation Concerns: No Electronic Signature(s) Signed: 05/08/2022 12:21:18 PM By: Duanne Guess MD FACS Entered By: Duanne Guess on 05/08/2022 08:05:21 -------------------------------------------------------------------------------- SuperBill Details Patient Name: Date of Service: [name] 05/08/2022 Medical Record Number: [id number] Patient Account Number: [id number] Date of Birth/Sex: Treating RN: October 28, 1965 (57 y.o. 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 E11.621 Type 2 diabetes mellitus with foot ulcer Z89.439 Acquired absence of unspecified foot Facility Procedures : [place], OM CPT4 Code: [id number] AR ([id number]) ICD-1 L Description: 11042 - DEB SUBQ TISSUE 20 SQ CM/< [phone] Diagnosis Description 97.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 withou Modifier: [id number] t evidence of ne Quantity: 1 27.pdf Page 8 of 8 crosis Physician Procedures : CPT4 Code Description Modifier [phone] [id number] - WC PHYS LEVEL 4 - EST PT 25 ICD-10 Diagnosis Description 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

## 2022-05-17 NOTE — Progress Notes (Signed)
[place], [name] ([id number]) [id number] Page 1 of 6 Visit Report for 05/08/2022 Arrival Information Details Patient Name: Date of Service: Samuel Most MA R 05/08/2022 7:30 A M Medical Record Number: [id number] Patient Account Number: [id number] Date of Birth/Sex: Treating RN: 04-03-1965 (57 y.o. Yates Decamp Primary Care Dezra Mandella: Marrianne Mood Other Clinician: Referring Aracelis Ulrey: Treating Eisa Necaise/Extender: Alena Bills in Treatment: 13 Visit Information History Since Last Visit All ordered tests and consults were completed: Yes Patient Arrived: Ambulatory Added or deleted any medications: No Arrival Time: 07:40 Any new allergies or adverse reactions: No Accompanied By: caregiver Had a fall or experienced change in No Transfer Assistance: None activities of daily living that may affect Patient Identification Verified: Yes risk of falls: Secondary Verification Process Completed: Yes Signs or symptoms of abuse/neglect since last visito No Patient Requires Transmission-Based Precautions: No Hospitalized since last visit: No Patient Has Alerts: No Implantable device outside of the clinic excluding No cellular tissue based products placed in the center since last visit: Has Dressing in Place as Prescribed: Yes Pain Present Now: No Electronic Signature(s) Signed: 05/17/2022 1:56:54 PM By: Brenton Grills Entered By: Brenton Grills on 05/08/2022 07:43:18 -------------------------------------------------------------------------------- Encounter Discharge Information Details Patient Name: Date of Service: Samuel Most MA R 05/08/2022 7:30 A M Medical Record Number: [id number] Patient Account Number: [id number] Date of Birth/Sex: Treating RN: 09-May-1965 (57 y.o. Yates Decamp Primary Care Keyonna Comunale: Marrianne Mood Other Clinician: Referring Reilley Latorre: Treating Karianna Gusman/Extender: Alena Bills in  Treatment: 13 Encounter Discharge Information Items Post Procedure Vitals Discharge Condition: Stable Temperature (F): 97.8 Ambulatory Status: Ambulatory Pulse (bpm): 71 Discharge Destination: Home Respiratory Rate (breaths/min): 18 Transportation: Private Auto Blood Pressure (mmHg): 148/80 Accompanied By: daughter Schedule Follow-up Appointment: Yes Clinical Summary of Care: Patient Declined Electronic Signature(s) Signed: 05/17/2022 1:56:54 PM By: Brenton Grills Entered By: Brenton Grills on 05/08/2022 08:29:29 -------------------------------------------------------------------------------- Lower Extremity Assessment Details Patient Name: Date of Service: Samuel Most MA R 05/08/2022 7:30 A M Medical Record Number: [id number] Patient Account Number: [id number] Date of Birth/Sex: Treating RN: 1965/11/28 (57 y.o. Yates Decamp Primary Care Philippa Vessey: Marrianne Mood Other Clinician: Referring Skylar Priest: Treating Margarine Grosshans/Extender: Alena Bills in Treatment: 13 Edema Assessment O[LeftHelane Gunther, [name] ([id number])] Franne Forts: [id number] Page 2 of 6] Assessed: [Left: No] [Right: No] Edema: [Left: No] [Right: No] Calf Left: Right: Point of Measurement: From Medial Instep 34.7 cm 35.5 cm Ankle Left: Right: Point of Measurement: From Medial Instep 25.2 cm 23.7 cm Vascular Assessment Pulses: Dorsalis Pedis Palpable: [Left:Yes] [Right:Yes] Electronic Signature(s) Signed: 05/17/2022 1:56:54 PM By: Brenton Grills Entered By: Brenton Grills on 05/08/2022 07:48:40 -------------------------------------------------------------------------------- Multi Wound Chart Details Patient Name: Date of Service: Samuel Most MA R 05/08/2022 7:30 A M Medical Record Number: [id number] Patient Account Number: [id number] Date of Birth/Sex: Treating RN: 02-28-1965 (57 y.o. M) Primary Care Dezaree Tracey: Marrianne Mood Other Clinician: Referring  Jayr Lupercio: Treating Numair Masden/Extender: Alena Bills in Treatment: 13 Vital Signs Height(in): 72 Capillary Blood Glucose(mg/dl): 283 Weight(lbs): 151 Pulse(bpm): 71 Body Mass Index(BMI): 29 Blood Pressure(mmHg): 146/80 Temperature(F): 97.8 Respiratory Rate(breaths/min): 18 [2:Photos:] [N/A:N/A] Right Amputation Site - N/A N/A Wound Location: Transmetatarsal Surgical Injury N/A N/A Wounding Event: Diabetic Wound/Ulcer of the Lower N/A N/A Primary Etiology: Extremity Lymphedema, Hypertension, Type II N/A N/A Comorbid History: Diabetes, Neuropathy 10/30/2021 N/A N/A Date Acquired: 13 N/A N/A Weeks of Treatment: Open N/A N/A Wound Status: No N/A N/A Wound Recurrence: 1x0.1x0.1 N/A N/A Measurements L x W  x D (cm) 0.079 N/A N/A A (cm) : rea 0.008 N/A N/A Volume (cm) : 99.40% N/A N/A % Reduction in A rea: 99.40% N/A N/A % Reduction in Volume: Grade 1 N/A N/A Classification: None Present N/A N/A Exudate A mount: Distinct, outline attached N/A N/A Wound Margin: Medium (34-66%) N/A N/A Granulation A mount: Pink N/A N/A Granulation Quality: Medium (34-66%) N/A N/A Necrotic A mount: Fat Layer (Subcutaneous Tissue): Yes N/A N/A Exposed Structures: Fascia: No [place], [name] ([id number]) [id number] Page 3 of 6 Tendon: No Muscle: No Joint: No Bone: No Medium (34-66%) N/A N/A Epithelialization: Debridement - Excisional N/A N/A Debridement: Lidocaine 4% Topical Solution N/A N/A Pain Control: Callus, Subcutaneous N/A N/A Tissue Debrided: Skin/Subcutaneous Tissue N/A N/A Level: 0.1 N/A N/A Debridement A (sq cm): rea Curette N/A N/A Instrument: Minimum N/A N/A Bleeding: Pressure N/A N/A Hemostasis A chieved: 0 N/A N/A Procedural Pain: 0 N/A N/A Post Procedural Pain: Debridement Treatment Response: Procedure was tolerated well N/A N/A Post Debridement Measurements L x 1x0.1x0.1 N/A N/A W x D  (cm) 0.008 N/A N/A Post Debridement Volume: (cm) Callus: Yes N/A N/A Periwound Skin Texture: Maceration: Yes N/A N/A Periwound Skin Moisture: Dry/Scaly: No No Abnormalities Noted N/A N/A Periwound Skin Color: No Abnormality N/A N/A Temperature: Debridement N/A N/A Procedures Performed: Treatment Notes Electronic Signature(s) Signed: 05/08/2022 8:04:33 AM By: Duanne Guess MD FACS Entered By: Duanne Guess on 05/08/2022 08:04:33 -------------------------------------------------------------------------------- Multi-Disciplinary Care Plan Details Patient Name: Date of Service: Samuel Most MA R 05/08/2022 7:30 A M Medical Record Number: [id number] Patient Account Number: [id number] Date of Birth/Sex: Treating RN: [birth year] (57 y.o. Yates Decamp Primary Care Ninah Moccio: Marrianne Mood Other Clinician: Referring Audryna Wendt: Treating Ladarrian Asencio/Extender: Alena Bills in Treatment: 13 Multidisciplinary Care Plan reviewed with physician Active Inactive Nutrition Nursing Diagnoses: Impaired glucose control: actual or potential Goals: Patient/caregiver verbalizes understanding of need to maintain therapeutic glucose control per primary care physician Date Initiated: 02/02/2022 Target Resolution Date: 06/27/2022 Goal Status: Active Interventions: Assess patient nutrition upon admission and as needed per policy Provide education on elevated blood sugars and impact on wound healing Treatment Activities: Dietary management education, guidance and counseling : 02/02/2022 Notes: Wound/Skin Impairment Nursing Diagnoses: Impaired tissue integrity Goals: Patient/caregiver will verbalize understanding of skin care regimen Date Initiated: 03/06/2022 Target Resolution Date: 06/27/2022 Goal Status: Active [place], [name] ([id number]) [id number] Page 4 of 6 Ulcer/skin breakdown will have a volume reduction of 30% by week 4 Date Initiated:  02/02/2022 Target Resolution Date: 06/27/2022 Goal Status: Active Interventions: Assess ulceration(s) every visit Provide education on ulcer and skin care Treatment Activities: Patient referred to home care : 02/02/2022 Skin care regimen initiated : 02/02/2022 Notes: Electronic Signature(s) Signed: 05/17/2022 1:56:54 PM By: Brenton Grills Entered By: Brenton Grills on 05/08/2022 08:04:06 -------------------------------------------------------------------------------- Pain Assessment Details Patient Name: Date of Service: Samuel Most MA R 05/08/2022 7:30 A M Medical Record Number: [id number] Patient Account Number: [id number] Date of Birth/Sex: Treating RN: Nov 15, 1965 (57 y.o. Yates Decamp Primary Care Gwen Edler: Marrianne Mood Other Clinician: Referring Nhu Glasby: Treating Nikole Swartzentruber/Extender: Alena Bills in Treatment: 13 Active Problems Location of Pain Severity and Description of Pain Patient Has Paino No Site Locations Pain Management and Medication Current Pain Management: Electronic Signature(s) Signed: 05/17/2022 1:56:54 PM By: Brenton Grills Entered By: Brenton Grills on 05/08/2022 07:45:36 -------------------------------------------------------------------------------- Patient/Caregiver Education Details Patient Name: Date of Service: Samuel Most MA R 4/9/2024andnbsp7:30 A M Medical Record Number: [id number] Patient Account Number: [id number] Date of  Birth/Gender: Treating RN: 1965/09/06 (57 y.o. Yates Decamp Primary Care Physician: Marrianne Mood Other Clinician: Referring Physician: Treating Physician/Extender: Alena Bills in Treatment: [address] [place], [place] ([id number]) [id number] Page 5 of 6 Education Provided To: Patient and Caregiver Education Topics Provided Wound Debridement: Methods: Explain/Verbal Responses: State content correctly Electronic  Signature(s) Signed: 05/17/2022 1:56:54 PM By: Brenton Grills Entered By: Brenton Grills on 05/08/2022 08:04:29 -------------------------------------------------------------------------------- Wound Assessment Details Patient Name: Date of Service: Samuel Most MA R 05/08/2022 7:30 A M Medical Record Number: [id number] Patient Account Number: [id number] Date of Birth/Sex: Treating RN: 12-27-1965 (57 y.o. 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 Wound Status: Open Wounding Event: Surgical Injury Comorbid Lymphedema, Hypertension, Type II Diabetes, Neuropathy History: Date Acquired: 10/30/2021 Weeks Of Treatment: 13 Clustered Wound: No Photos Wound Measurements Length: (cm) 1 Width: (cm) 0.1 Depth: (cm) 0.1 Area: (cm) 0.079 Volume: (cm) 0.008 % Reduction in Area: 99.4% % Reduction in Volume: 99.4% Epithelialization: Medium (34-66%) Tunneling: No Undermining: No Wound Description Classification: Grade 1 Wound Margin: Distinct, outline attached Exudate Amount: None Present Foul Odor After Cleansing: No Slough/Fibrino Yes Wound Bed Granulation Amount: Medium (34-66%) Exposed Structure Granulation Quality: Pink Fascia Exposed: No Necrotic Amount: Medium (34-66%) Fat Layer (Subcutaneous Tissue) Exposed: Yes Necrotic Quality: Adherent Slough Tendon Exposed: No Muscle Exposed: No Joint Exposed: No Bone Exposed: No [address] Color Briss[place], Hanif ([id number]) [id number] Page 6 of 6 No Abnormalities Noted: No No Abnormalities Noted: Yes Callus: Yes Temperature / Pain Temperature: No Abnormality Moisture No Abnormalities Noted: Yes Treatment  Notes Wound #2 (Amputation Site - Transmetatarsal) Wound Laterality: Right Cleanser Soap and Water Discharge Instruction: May shower and wash wound with dial antibacterial soap and water prior to dressing change. Peri-Wound Care Sween Lotion (Moisturizing lotion) Discharge Instruction: Apply moisturizing lotion as directed Topical Primary Dressing Sorbalgon AG Dressing, 4x4 (in/in) Discharge Instruction: Apply to wound bed as instructed Secondary Dressing Optifoam Non-Adhesive Dressing, 4x4 in Discharge Instruction: Apply over primary dressing cut to make foam donut Woven Gauze Sponges 2x2 in Discharge Instruction: Apply over primary dressing as directed. Secured With Elastic Bandage 4 inch (ACE bandage) Discharge Instruction: Secure with ACE bandage as directed. Kerlix Roll Sterile, 4.5x3.1 (in/yd) Discharge Instruction: Secure with Kerlix as directed. 24M Medipore H Soft Cloth Surgical T ape, 4 x 10 (in/yd) Discharge Instruction: Secure with tape as directed. Compression Wrap Compression Stockings Add-Ons Electronic Signature(s) Signed: 05/17/2022 1:56:54 PM By: Brenton Grills Entered By: Brenton Grills on 05/08/2022 07:53:02 -------------------------------------------------------------------------------- Vitals Details Patient Name: Date of Service: Samuel Most MA R 05/08/2022 7:30 A M Medical Record Number: [id number] Patient Account Number: [id number] Date of Birth/Sex: Treating RN: [birth year] (57 y.o. Yates Decamp Primary Care Jaivian Battaglini: Marrianne Mood Other Clinician: Referring Emmelia Holdsworth: Treating Ellowyn Rieves/Extender: Alena Bills in Treatment: 13 Vital Signs Time Taken: 07:44 Temperature (F): 97.8 Height (in): 72 Pulse (bpm): 71 Weight (lbs): 214 Respiratory Rate (breaths/min): 18 Body Mass Index (BMI): 29 Blood Pressure (mmHg): 146/80 Capillary Blood Glucose (mg/dl): 629 Reference Range: 80 - 120 mg / dl Electronic  Signature(s) Signed: 05/17/2022 1:56:54 PM By: Brenton Grills Entered By: Brenton Grills on 05/08/2022 07:45:25

## 2022-05-22 ENCOUNTER — Encounter (HOSPITAL_BASED_OUTPATIENT_CLINIC_OR_DEPARTMENT_OTHER): Payer: Medicaid Other | Admitting: General Surgery

## 2022-05-22 NOTE — Progress Notes (Signed)
[place], [name] ([id number]) [id number] Page 1 of 6 Visit Report for 05/22/2022 Chief Complaint Document Details Patient Name: Date of Service: [name] 05/22/2022 8:15 A M Medical Record Number: [id number] Patient Account Number: [id number] Date of Birth/Sex: Treating RN: October 21, 1965 (57 y.o. 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 M) Primary Care Provider: Marrianne Mood Other Clinician: Referring Provider: Treating Provider/Extender: [name] in Treatment: 15 History of Present Illness HPI Description: ADMISSION 02/02/2022 This is a 57 year old [place] man who speaks only Arabic. The visit today was conducted with the assistance of the language line interpreter; the patient declines the use of an in person interpreter. He is a poorly controlled diabetic (last hemoglobin A1c 10.8, but it has been as high as 15 in the past) with CKD stage III and hypertension. 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 He required revision of the right TMA in October. This  was done in the [facility]. It is not entirely clear how he was referred to our clinic however he has openings on the distal portion of the right foot as well as drainage coming from the plantar aspect of his left foot underneath some callus. The patient has just been applying dry gauze to both areas. 02/09/2022: The left-sided wound is quite a bit smaller today, but is covered with callus. The right sided wound has also contracted a little bit and is very clean, but there is also callus accumulation around the perimeter. Unfortunately, due to his Medicaid status, we have been unable to secure home health assistance and he has been changing the dressings on his own. He also has difficulty obtaining dressing supplies. 02/19/2022: Both wounds are smaller, particularly on the right. The left is covered with a layer of callus. No concern for infection. 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 There is light slough on the surface with some surrounding callus. The left plantar foot wound has callused over. Underneath the callus, the there is a small opening in the skin. 03/06/2022: The wounds are about the same size this week. There is callus accumulation around each, but the surfaces are cleaner. 03/19/2022: The wound on the right is quite a bit smaller this week. There is minimal callus accumulation and light slough on the surface. The wound on the left is almost completely covered in callus. 03/26/2022: The wound on the right continues to contract and is narrower again this week. The wound on the left has become covered in callus once again. 04/24/2022: In the month since he was last seen, apparently the patient developed some purulent drainage from his foot and was seen by podiatry at [facility]. He was debrided and given antibiotics. Subsequently, his wounds actually look extremely good today. There is callus covering the plantar ulcer on the left. The right TMA  site is quite a bit  narrower and clean with just some callus and slough accumulation. 05/01/2022: The wound on the plantar surface of his left foot is closed under a thick layer of callus. On the right, the wound is substantially smaller with just a little bit of slough but also heavy buildup of callus around the wound opening. 05/08/2022: The left plantar foot ulcer remains closed. On the right, the wound continues to narrow significantly. There is still some depth to the wound, but it is clean. 05/22/2022: His wounds are closed. Electronic Signature(s) Signed: 05/22/2022 8:18:30 AM By: Duanne Guess MD FACS [place], [name] ([id number]) [id number] Page 2 of 6 Entered By: Duanne Guess on 05/22/2022 08:18:30 -------------------------------------------------------------------------------- Physical Exam Details Patient Name: Date of Service: [name] 05/22/2022 8:15 A M Medical Record Number: [id number] Patient Account Number: [id number] Date of Birth/Sex: Treating RN: [birth year] (57 y.o. M) Primary Care Provider: Marrianne Mood Other Clinician: Referring Provider: Treating Provider/Extender: [name] in Treatment: 15 Constitutional Slightly hypertensive. . . . no acute distress. Respiratory Normal work of breathing on room air. Notes 05/22/2022: His wounds are healed. Electronic Signature(s) Signed: 05/22/2022 8:20:54 AM By: Duanne Guess MD FACS Entered By: Duanne Guess on 05/22/2022 08:20:53 -------------------------------------------------------------------------------- Physician Orders Details Patient Name: Date of Service: [name] 05/22/2022 8:15 A M Medical Record Number: [id number] Patient Account Number: [id number] Date of Birth/Sex: Treating RN: 05/12/1965 (57 y.o. 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 - Dr. Lady Gary Room 1 +++ INTERPRETER [facility] Return A Bathing/ Shower/ Hygiene May shower and wash wound with soap and water. Non Wound Condition pply the following to affected area as directed: - urea cream 40% to callouses daily (do not put on open areas) A Electronic Signature(s) Signed: 05/22/2022 8:22:09 AM By: Duanne Guess MD FACS Entered By: Duanne Guess on 05/22/2022 08:22:09 -------------------------------------------------------------------------------- Problem List Details Patient Name: Date of Service: [name] 05/22/2022 8:15 A M Medical Record Number: [id number] Patient Account Number: [id number] Date of Birth/Sex: Treating RN: [birth year] (57 y.o. 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 02/02/2022 No Yes without evidence of necrosis L97.522 Non-pressure chronic ulcer of other part of left foot with fat layer exposed 02/02/2022 No Yes E11.621 Type 2 diabetes mellitus with foot ulcer 02/02/2022 No Yes Z89.439 Acquired absence of unspecified foot 02/02/2022 No Yes Inactive Problems Resolved Problems Electronic Signature(s) Signed:  05/22/2022 8:17:42 AM By: Duanne Guess MD FACS Entered By: Duanne Guess on 05/22/2022 08:17:42 -------------------------------------------------------------------------------- Progress Note Details Patient Name: Date of Service: [name] 05/22/2022 8:15 A M Medical Record Number: [id number] Patient Account Number: [id number] Date of Birth/Sex: Treating RN: February 12, 1965 (57 y.o. 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 The visit today was conducted with the assistance of the language line interpreter; the patient declines the use of an in person interpreter. He is a poorly controlled diabetic (last hemoglobin A1c 10.8, but it has been as high as 15 in the past) with CKD stage III and hypertension. 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 He required revision of the right TMA in October. This was done in the [facility] system. It is not entirely clear how he was referred to our clinic however he has openings on the distal portion of the right foot as well as drainage coming from the plantar aspect of his left foot underneath some callus. The patient has just been applying dry gauze to both areas. 02/09/2022: The left-sided wound is quite a bit smaller today, but is covered with callus. The right sided wound has also contracted a little bit and is very clean, but there is also callus accumulation  around the perimeter. Unfortunately, due to his Medicaid status, we have been unable to secure home health assistance and he has been changing the dressings on his own. He also has difficulty obtaining dressing supplies. 02/19/2022: Both wounds are smaller, particularly on the right. The left is covered with a layer of callus. No concern for infection. 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 There is light slough on the surface with some surrounding callus. The left plantar foot wound has callused over. Underneath the callus, the there is a small opening in the skin. 03/06/2022: The wounds are about the same size this week. There is callus accumulation around each, but the surfaces are cleaner. 03/19/2022: The wound on the right is quite a bit smaller this week. There is minimal callus accumulation and light slough on the surface. The wound on the left is almost completely covered in callus. 03/26/2022: The wound on the right continues to contract and is narrower again this week. The wound on the left has become covered in callus once again. [place], [name] ([id number]) [id number] Page 4 of 6 04/24/2022: In the month since he was last seen, apparently the patient developed some purulent drainage from his foot and was seen by podiatry at [facility]. He was debrided and given antibiotics. Subsequently, his wounds actually look extremely good today. There is callus covering the plantar ulcer on the left. The right TMA site is quite a bit narrower and clean with just some callus and slough accumulation. 05/01/2022: The wound on the plantar surface of his left foot is closed under a thick layer of callus. On the right, the wound is substantially smaller with just a little bit of slough but also heavy buildup of callus around the wound opening. 05/08/2022: The left plantar foot ulcer remains closed. On the right, the wound continues to narrow significantly.  There is still some depth to the wound, but it is clean. 05/22/2022: His wounds are closed. Patient History Family History Unknown History, Cancer, No family history of Diabetes, Heart Disease, Hereditary Spherocytosis, Hypertension, Kidney Disease, Lung Disease, Seizures, Stroke, Thyroid Problems, Tuberculosis. Social History Never smoker, Marital Status - Married, Alcohol Use - Never, Drug Use -  No History, Caffeine Use - Rarely. Medical History Hematologic/Lymphatic Patient has history of Lymphedema Cardiovascular Patient has history of Hypertension Endocrine Patient has history of Type II Diabetes Neurologic Patient has history of Neuropathy Medical A Surgical History Notes nd Genitourinary AKI Objective Constitutional Slightly hypertensive. no acute distress. Vitals Time Taken: 7:55 AM, Height: 72 in, Weight: 214 lbs, BMI: 29, Temperature: 98.4 F, Pulse: 72 bpm, Respiratory Rate: 18 breaths/min, Blood Pressure: 141/79 mmHg, Capillary Blood Glucose: 351 mg/dl. Respiratory Normal work of breathing on room air. General Notes: 05/22/2022: His wounds are healed. Integumentary (Hair, Skin) Wound #2 status is Healed - Epithelialized. Original cause of wound was Surgical Injury. The date acquired was: 10/30/2021. The wound has been in treatment 15 weeks. The wound is located on the Right Amputation Site - Transmetatarsal. The wound measures 0cm length x 0cm width x 0cm depth; 0cm^2 area and 0cm^3 volume. There is a none present amount of drainage noted. The wound margin is distinct with the outline attached to the wound base. There is no granulation within the wound bed. There is no necrotic tissue within the wound bed. The periwound skin appearance had no abnormalities noted for moisture. The periwound skin appearance had no abnormalities noted for color. The periwound skin appearance exhibited: Callus. Periwound temperature was noted as No Abnormality. 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 - Dr. Lady Gary Room 1 +++ INTERPRETER [facility] Bathing/ Shower/ Hygiene: May shower and wash wound with soap and water. Non Wound Condition: Apply the following to affected area as directed: - urea cream 40% to callouses daily (do not put on open areas) 06/21/2022: His wounds are healed. He and his wife did request a wound check visit in 2 weeks just to make sure that they remain closed. I will see them at that time. Electronic Signature(s) Signed: 05/22/2022 8:22:45 AM By: Duanne Guess MD FACS Entered By: Duanne Guess on 05/22/2022 08:22:45 -------------------------------------------------------------------------------- HxROS Details Patient Name: Date of Service: [name] 05/22/2022 8:15 A M Medical Record Number: [id number] Patient Account Number: [id number] Date of Birth/Sex: Treating RN: November 17, 1965 (57 y.o. M) Primary Care Provider: Marrianne Mood Other Clinician: Referring Provider: Treating Provider/Extender: [name] in Treatment: 15 Hematologic/Lymphatic Medical History: Positive for: Lymphedema Cardiovascular Medical History: Positive for: Hypertension Endocrine Medical History: Positive for: Type II Diabetes Treated with: Insulin, Oral agents Genitourinary Medical History: Past Medical History Notes: AKI Neurologic Medical History: Positive for: Neuropathy Immunizations Pneumococcal Vaccine: Received Pneumococcal Vaccination: Yes Received Pneumococcal Vaccination On or After 60th Birthday:  Yes Implantable Devices No devices added Family and Social History Unknown History: Yes; Cancer: Yes; Diabetes: No; Heart Disease: No; Hereditary Spherocytosis: No; Hypertension: No; Kidney Disease: No; Lung Disease: No; Seizures: No; Stroke: No; Thyroid Problems: No; Tuberculosis: No; Never smoker; Marital Status - Married; Alcohol Use: Never; Drug Use: No History; Caffeine Use: Rarely; Financial Concerns: No; Food, Clothing or Shelter Needs: No; Support System Lacking: No; Transportation Concerns: No Electronic Signature(s) Signed: 05/22/2022 11:24:22 AM By: Duanne Guess MD FACS Entered By: Duanne Guess on 05/22/2022 08:18:38 Isaac Laud ([id number]) [id number] Page 6 of 6 -------------------------------------------------------------------------------- SuperBill Details Patient Name: Date of Service: O SMA N,  [place] Hernandez 05/22/2022 Medical Record Number: [id number] Patient Account Number: [id number] Date of Birth/Sex: Treating RN: [birth year] (57 y.o. 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 E11.621 Type 2 diabetes mellitus with foot ulcer Z89.439 Acquired absence of unspecified foot Physician Procedures : CPT4 Code Description Modifier [id number] - WC PHYS LEVEL 3 - EST PT ICD-10 Diagnosis Description 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

## 2022-05-23 NOTE — Progress Notes (Signed)
[name], [name] ([id number]) [id number] Page 1 of 6 Visit Report for 05/22/2022 Arrival Information Details Patient Name: Date of Service: [name] 05/22/2022 8:15 A M Medical Record Number: [id number] Patient Account Number: [id number] Date of Birth/Sex: Treating RN: 27-Nov-1965 (57 y.o. Yates Decamp Primary Care Tanor Glaspy: Marrianne Mood Other Clinician: Referring Riti Rollyson: Treating Jaskirat Schwieger/Extender: Alena Bills in Treatment: 15 Visit Information History Since Last Visit All ordered tests and consults were completed: Yes Patient Arrived: Ambulatory Added or deleted any medications: No Arrival Time: 07:53 Any new allergies or adverse reactions: No Accompanied By: wife Had a fall or experienced change in No Transfer Assistance: None activities of daily living that may affect Patient Identification Verified: Yes risk of falls: Secondary Verification Process Completed: Yes Signs or symptoms of abuse/neglect since last visito No Patient Requires Transmission-Based Precautions: No Hospitalized since last visit: No Patient Has Alerts: No Implantable device outside of the clinic excluding No cellular tissue based products placed in the center since last visit: Has Dressing in Place as Prescribed: Yes Pain Present Now: No Electronic Signature(s) Signed: 05/23/2022 10:17:35 AM By: Brenton Grills Entered By: Brenton Grills on 05/22/2022 07:55:50 -------------------------------------------------------------------------------- Encounter Discharge Information Details Patient Name: Date of Service: [name] 05/22/2022 8:15 A M Medical Record Number: [id number] Patient Account Number: [id number] Date of Birth/Sex: Treating RN: 02/11/1965 (57 y.o. Yates Decamp Primary Care Isys Tietje: Marrianne Mood Other Clinician: Referring Abygale Karpf: Treating Sahib Pella/Extender: Alena Bills in  Treatment: 15 Encounter Discharge Information Items Discharge Condition: Stable Ambulatory Status: Ambulatory Discharge Destination: Home Transportation: Private Auto Accompanied By: self Schedule Follow-up Appointment: Yes Clinical Summary of Care: Patient Declined Notes Patient will be seen in 2 weeks to make sure wound site stays closed. Electronic Signature(s) Signed: 05/23/2022 10:17:35 AM By: Brenton Grills Entered By: Brenton Grills on 05/22/2022 08:28:05 -------------------------------------------------------------------------------- Lower Extremity Assessment Details Patient Name: Date of Service: [name] 05/22/2022 8:15 A M Medical Record Number: [id number] Patient Account Number: [id number] Date of Birth/Sex: Treating RN: 01-31-1965 (57 y.o. Yates Decamp Primary Care Tomicka Lover: Marrianne Mood Other Clinician: Referring Waynesha Rammel: Treating Burke Terry/Extender: Alena Bills in Treatment: [address], [place] ([id number]) [id number] Page 2 of 6 Edema Assessment Assessed: [Left: No] [Right: No] Edema: [Left: No] [Right: No] Calf Left: Right: Point of Measurement: From Medial Instep 38 cm 37.2 cm Ankle Left: Right: Point of Measurement: From Medial Instep 25 cm 24.2 cm Vascular Assessment Pulses: Dorsalis Pedis Palpable: [Left:Yes] [Right:Yes] Electronic Signature(s) Signed: 05/23/2022 10:17:35 AM By: Brenton Grills Entered By: Brenton Grills on 05/22/2022 08:00:11 -------------------------------------------------------------------------------- Multi Wound Chart Details Patient Name: Date of Service: [name] 05/22/2022 8:15 A M Medical Record Number: [id number] Patient Account Number: [id number] Date of Birth/Sex: Treating RN: [birth year] (57 y.o. M) Primary Care Stevin Bielinski: Marrianne Mood Other Clinician: Referring Blinda Turek: Treating Elvie Palomo/Extender: Alena Bills in  Treatment: 15 Vital Signs Height(in): 72 Capillary Blood Glucose(mg/dl): 742 Weight(lbs): 595 Pulse(bpm): 72 Body Mass Index(BMI): 29 Blood Pressure(mmHg): 141/79 Temperature(F): 98.4 Respiratory Rate(breaths/min): 18 [2:Photos:] [N/A:N/A] Right Amputation Site - N/A N/A Wound Location: Transmetatarsal Surgical Injury N/A N/A Wounding Event: Diabetic Wound/Ulcer of the Lower N/A N/A Primary Etiology: Extremity Lymphedema, Hypertension, Type II N/A N/A Comorbid History: Diabetes, Neuropathy 10/30/2021 N/A N/A Date Acquired: 15 N/A N/A Weeks of Treatment: Healed - Epithelialized N/A N/A Wound Status: No N/A N/A Wound Recurrence: 0x0x0 N/A N/A Measurements L x W x D (  cm) 0 N/A N/A A (cm) : rea 0 N/A N/A Volume (cm) : 100.00% N/A N/A % Reduction in A rea: 100.00% N/A N/A % Reduction in Volume: Grade 1 N/A N/A Classification: None Present N/A N/A Exudate A mount: Distinct, outline attached N/A N/A Wound Margin: None Present (0%) N/A N/A Granulation A mount: Medium (34-66%) N/A N/A Necrotic A mount: [name] ([id number]) [id number] Page 3 of 6 Eschar N/A N/A Necrotic Tissue: Fascia: No N/A N/A Exposed Structures: Fat Layer (Subcutaneous Tissue): No Tendon: No Muscle: No Joint: No Bone: No Medium (34-66%) N/A N/A Epithelialization: Debridement - Selective/Open Wound N/A N/A Debridement: Pre-procedure Verification/Time Out 08:14 N/A N/A Taken: Callus N/A N/A Tissue Debrided: Non-Viable Tissue N/A N/A Level: 0.01 N/A N/A Debridement A (sq cm): rea Curette N/A N/A Instrument: Minimum N/A N/A Bleeding: Pressure N/A N/A Hemostasis A chieved: 0 N/A N/A Procedural Pain: 0 N/A N/A Post Procedural Pain: Procedure was tolerated well N/A N/A Debridement Treatment Response: 0.1x0.1x0.1 N/A N/A Post Debridement Measurements L x W x D (cm) 0.001 N/A N/A Post Debridement Volume: (cm) Callus: Yes N/A N/A Periwound  Skin Texture: Maceration: Yes N/A N/A Periwound Skin Moisture: Dry/Scaly: No No Abnormalities Noted N/A N/A Periwound Skin Color: No Abnormality N/A N/A Temperature: Debridement N/A N/A Procedures Performed: Treatment Notes Electronic Signature(s) Signed: 05/22/2022 8:17:48 AM By: Duanne Guess MD FACS Entered By: Duanne Guess on 05/22/2022 08:17:47 -------------------------------------------------------------------------------- Multi-Disciplinary Care Plan Details Patient Name: Date of Service: [name] 05/22/2022 8:15 A M Medical Record Number: [id number] Patient Account Number: [id number] Date of Birth/Sex: Treating RN: [birth year] (57 y.o. Yates Decamp Primary Care Takoya Jonas: Marrianne Mood Other Clinician: Referring Breda Bond: Treating Eric Nees/Extender: Alena Bills in Treatment: 15 Multidisciplinary Care Plan reviewed with physician Active Inactive Nutrition Nursing Diagnoses: Impaired glucose control: actual or potential Goals: Patient/caregiver verbalizes understanding of need to maintain therapeutic glucose control per primary care physician Date Initiated: 02/02/2022 Target Resolution Date: 06/27/2022 Goal Status: Active Interventions: Assess patient nutrition upon admission and as needed per policy Provide education on elevated blood sugars and impact on wound healing Treatment Activities: Dietary management education, guidance and counseling : 02/02/2022 Notes: Wound/Skin Impairment Nursing Diagnoses: Impaired tissue integrity GoalsCIRILO, CANNER ([id number]) [id number] Page 4 of 6 Patient/caregiver will verbalize understanding of skin care regimen Date Initiated: 03/06/2022 Target Resolution Date: 06/27/2022 Goal Status: Active Ulcer/skin breakdown will have a volume reduction of 30% by week 4 Date Initiated: 02/02/2022 Target Resolution Date: 06/27/2022 Goal Status:  Active Interventions: Assess ulceration(s) every visit Provide education on ulcer and skin care Treatment Activities: Patient referred to home care : 02/02/2022 Skin care regimen initiated : 02/02/2022 Notes: Electronic Signature(s) Signed: 05/23/2022 10:17:35 AM By: Brenton Grills Entered By: Brenton Grills on 05/22/2022 08:24:59 -------------------------------------------------------------------------------- Pain Assessment Details Patient Name: Date of Service: [name] 05/22/2022 8:15 A M Medical Record Number: [id number] Patient Account Number: [id number] Date of Birth/Sex: Treating RN: [birth year] (57 y.o. Yates Decamp Primary Care Amiyrah Lamere: Marrianne Mood Other Clinician: Referring Wayburn Shaler: Treating Yajaira Doffing/Extender: Alena Bills in Treatment: 15 Active Problems Location of Pain Severity and Description of Pain Patient Has Paino No Site Locations Pain Management and Medication Current Pain Management: Electronic Signature(s) Signed: 05/23/2022 10:17:35 AM By: Brenton Grills Entered By: Brenton Grills on 05/22/2022 07:57:27 -------------------------------------------------------------------------------- Patient/Caregiver Education Details Patient Name: Date of Service: [name] 4/23/2024andnbsp8:15 A M Medical Record Number: [id number] Patient Account Number: [id number] Date of Birth/Gender: Treating  RN: 01-17-1966 (57 y.o. Yates Decamp Primary Care Physician: Marrianne Mood Other Clinician: Referring Physician: Treating Physician/Extender: Allena Katz [place], [name] ([id number]) [id number] Page 5 of 6 Weeks in Treatment: 15 Education Assessment Education Provided To: Patient and Caregiver Education Topics Provided Wound/Skin Impairment: Methods: Explain/Verbal Responses: State content correctly Electronic Signature(s) Signed: 05/23/2022 10:17:35 AM By: Brenton Grills Entered By: Brenton Grills on 05/22/2022 08:10:50 -------------------------------------------------------------------------------- Wound Assessment Details Patient Name: Date of Service: [name] 05/22/2022 8:15 A M Medical Record Number: [id number] Patient Account Number: [id number] Date of Birth/Sex: Treating RN: July 30, 1965 (57 y.o. 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 Wound Status: Healed - Epithelialized Wounding Event: Surgical Injury Comorbid Lymphedema, Hypertension, Type II Diabetes, Neuropathy History: Date Acquired: 10/30/2021 Weeks Of Treatment: 15 Clustered Wound: No Photos Wound Measurements Length: (cm) Width: (cm) Depth: (cm) Area: (cm) Volume: (cm) 0 % Reduction in Area: 100% 0 % Reduction in Volume: 100% 0 Epithelialization: Medium (34-66%) 0 0 Wound Description Classification: Grade 1 Wound Margin: Distinct, outline attached Exudate Amount: None Present Foul Odor After Cleansing: No Slough/Fibrino No Wound Bed Granulation Amount: None Present (0%) Exposed Structure Necrotic Amount: None Present (0%) Fascia Exposed: No Fat Layer (Subcutaneous Tissue) Exposed: No Tendon Exposed: No Muscle Exposed: No Joint Exposed: No Bone Exposed: No Senna, Matin ([id number]) [id number] Page 6 of 6 Periwound Skin Texture Texture Color No Abnormalities Noted: No No Abnormalities Noted: Yes Callus: Yes Temperature / Pain Temperature: No Abnormality Moisture No Abnormalities Noted: Yes Treatment Notes Wound #2 (Amputation Site - Transmetatarsal) Wound Laterality: Right Cleanser Peri-Wound Care Topical Primary Dressing Secondary  Dressing Secured With Compression Wrap Compression Stockings Add-Ons Electronic Signature(s) Signed: 05/23/2022 10:17:35 AM By: Brenton Grills Entered By: Brenton Grills on 05/22/2022 08:18:00 -------------------------------------------------------------------------------- Vitals Details Patient Name: Date of Service: [name] 05/22/2022 8:15 A M Medical Record Number: [id number] Patient Account Number: [id number] Date of Birth/Sex: Treating RN: 02-16-1965 (57 y.o. Yates Decamp Primary Care Ellason Segar: Marrianne Mood Other Clinician: Referring Aune Adami: Treating Meggen Spaziani/Extender: Alena Bills in Treatment: 15 Vital Signs Time Taken: 07:55 Temperature (F): 98.4 Height (in): 72 Pulse (bpm): 72 Weight (lbs): 214 Respiratory Rate (breaths/min): 18 Body Mass Index (BMI): 29 Blood Pressure (mmHg): 141/79 Capillary Blood Glucose (mg/dl): 272 Reference Range: 80 - 120 mg / dl Electronic Signature(s) Signed: 05/23/2022 10:17:35 AM By: Brenton Grills Entered By: Brenton Grills on 05/22/2022 07:57:20

## 2022-05-29 ENCOUNTER — Encounter (HOSPITAL_BASED_OUTPATIENT_CLINIC_OR_DEPARTMENT_OTHER): Payer: Medicaid Other | Admitting: General Surgery

## 2022-05-29 DIAGNOSIS — E11621 Type 2 diabetes mellitus with foot ulcer: Secondary | ICD-10-CM | POA: Diagnosis not present

## 2022-05-30 NOTE — Progress Notes (Signed)
[name], [name] ([id number]) [id number] Page 1 of 7 Visit Report for 05/29/2022 Arrival Information Details Patient Name: Date of Service: [name] 05/29/2022 10:30 A M Medical Record Number: [id number] Patient Account Number: [id number] Date of Birth/Sex: Treating RN: 1965-02-02 (57 y.o. M) Primary Care [name]: [name] Other Clinician: Referring [name]: Treating [name]/Extender: [name] in Treatment: 16 Visit Information History Since Last Visit All ordered tests and consults were completed: No Patient Arrived: Ambulatory Added or deleted any medications: No Arrival Time: 10:43 Any new allergies or adverse reactions: No Accompanied By: daughter Had a fall or experienced change in No Transfer Assistance: None activities of daily living that may affect Patient Identification Verified: Yes risk of falls: Secondary Verification Process Completed: Yes Signs or symptoms of abuse/neglect since last visito No Patient Requires Transmission-Based Precautions: No Hospitalized since last visit: No Patient Has Alerts: No Implantable device outside of the clinic excluding No cellular tissue based products placed in the center since last visit: Pain Present Now: No Electronic Signature(s) Signed: 05/29/2022 11:32:44 AM By: [name] Entered By: [name] on 05/29/2022 10:43:51 -------------------------------------------------------------------------------- Encounter Discharge Information Details Patient Name: Date of Service: [name] 05/29/2022 10:30 A M Medical Record Number: [id number] Patient Account Number: [id number] Date of Birth/Sex: Treating RN: 1965-09-29 (57 y.o. [name] Primary Care July Nickson: [name] Other Clinician: Referring [name]: Treating [name]/Extender: [name] in Treatment: 16 Encounter Discharge Information Items Post  Procedure Vitals Discharge Condition: Stable Temperature (F): 99.1 Ambulatory Status: Ambulatory Pulse (bpm): 73 Discharge Destination: Home Respiratory Rate (breaths/min): 18 Transportation: Private Auto Blood Pressure (mmHg): 128/79 Accompanied By: daughter Schedule Follow-up Appointment: Yes Clinical Summary of Care: Patient Declined Electronic Signature(s) Signed: 05/29/2022 3:48:02 PM By: Samuel Pulling RN, BSN Entered By: [name] on 05/29/2022 12:57:44 -------------------------------------------------------------------------------- Lower Extremity Assessment Details Patient Name: Date of Service: [name] 05/29/2022 10:30 A M Medical Record Number: [id number] Patient Account Number: [id number] Date of Birth/Sex: Treating RN: Aug 28, 1965 (57 y.o. [name] Primary Care [name]: [name] Other Clinician: Referring [name]: Treating [name]/Extender: [name] in Treatment: 16 Edema Assessment Assessed: [name]: No] [name]: No] O[LeftHelane Hernandez, [name] ([id number])] [Right: [id number] Page 2 of 7] Edema: [Left: No] [Right: No] Calf Left: Right: Point of Measurement: From Medial Instep 36.5 cm 37.5 cm Ankle Left: Right: Point of Measurement: From Medial Instep 25 cm 24 cm Vascular Assessment Pulses: Dorsalis Pedis Palpable: [Left:Yes] [Right:Yes] Electronic Signature(s) Signed: 05/29/2022 3:48:02 PM By: Samuel Pulling RN, BSN Entered By: [name] on 05/29/2022 10:47:03 -------------------------------------------------------------------------------- Multi Wound Chart Details Patient Name: Date of Service: [name] 05/29/2022 10:30 A M Medical Record Number: [id number] Patient Account Number: [id number] Date of Birth/Sex: Treating RN: 1965/08/24 (57 y.o. M) Primary Care [name]: [name] Other Clinician: Referring [name]: Treating Kia Stavros/Extender: [name] in Treatment: 16 Vital Signs Height(in): 72 Capillary Blood Glucose(mg/dl): 601 Weight(lbs): 093 Pulse(bpm): 73 Body Mass Index(BMI): 29 Blood Pressure(mmHg): 128/79 Temperature(F): 99.1 Respiratory Rate(breaths/min): 20 [3:Photos: No Photos Right Foot Wound Location: Gradually Appeared Wounding Event: Diabetic Wound/Ulcer of the Lower Primary Etiology: Extremity Lymphedema, Hypertension, Type II Comorbid History: Diabetes, Neuropathy 05/29/2022 Date Acquired: 0 Weeks of Treatment:  Open Wound Status: No Wound Recurrence: 0.5x0.2x0.2 Measurements L x W x D (cm) 0.079 A (cm) : rea 0.016 Volume (cm) : Grade 2 Classification: Small Exudate A mount: Serosanguineous Exudate Type: red, brown  Exudate Color: None Present (0%) Granulation  A mount: Large (67-100%) Necrotic A mount: Eschar, Adherent Slough Necrotic Tissue: Fascia: No Exposed Structures: Fat Layer (Subcutaneous Tissue): No Tendon: No Muscle: No Joint: No Bone: No None Epithelialization: Debridement - Selective/Open Wound  Debridement: Pre-procedure Verification/Time Out 10:55 Taken: Lidocaine 4% Topical Solution Pain Control: Callus Tissue Debrided: Non-Viable Tissue Level: 0.08 Debridement A (sq cm): rea] [4:No Photos Left Amputation Site - Transmetatarsal N/A Hematoma  Diabetic Wound/Ulcer of the Lower Extremity Lymphedema, Hypertension, Type II N/A Diabetes, Neuropathy 05/29/2022 0 Open No 4x2.5x1 7.854 7.854 Grade 2 Medium Serosanguineous red, brown None Present (0%) Large (67-100%) Eschar, Adherent Slough Fat Layer  (Subcutaneous Tissue): Yes N/A Fascia: No Tendon: No Muscle: No Joint: No Bone: No None Debridement - Excisional 10:55 Lidocaine 4% Topical Solution Callus, Subcutaneous Skin/Subcutaneous Tissue 7.85] [N/A:N/A N/A N/A N/A N/A N/A N/A N/A N/A N/A N/A N/A  N/A N/A N/A N/A N/A N/A N/A N/A N/A N/A N/A N/A] [name], [name] ([id number]) [3:Curette Instrument: Minimum Bleeding: Pressure Hemostasis  Achieved: 0 Procedural Pain: 0 Post Procedural Pain: Procedure was tolerated well Debridement Treatment Response: 0.5x0.2x0.2 Post Debridement Measurements L x W x D (cm) 0.016 Post Debridement  Volume: (cm) Periwound Skin Texture: Debridement Procedures Performed:] [4:Curette Minimum Pressure 0 0 Procedure was tolerated well 4x2.5x1 7.854 Callus: Yes Debridement] [N/A:[id number] Page 3 of 7 N/A N/A N/A N/A N/A N/A  N/A N/A N/A N/A] Treatment Notes Electronic Signature(s) Signed: 05/29/2022 12:18:31 PM By: Duanne Guess MD FACS Entered By: Duanne Guess on 05/29/2022 12:18:31 -------------------------------------------------------------------------------- Multi-Disciplinary Care Plan Details Patient Name: Date of Service: [name] 05/29/2022 10:30 A M Medical Record Number: [id number] Patient Account Number: [id number] Date of Birth/Sex: Treating RN: 04-29-1965 (57 y.o. [name] Primary Care Jaiyanna Safran: [name] Other Clinician: Referring Alekzander Cardell: Treating Jamelah Sitzer/Extender: [name] in Treatment: 16 Multidisciplinary Care Plan reviewed with physician Active Inactive Nutrition Nursing Diagnoses: Impaired glucose control: actual or potential Goals: Patient/caregiver verbalizes understanding of need to maintain therapeutic glucose control per primary care physician Date Initiated: 02/02/2022 Target Resolution Date: 06/27/2022 Goal Status: Active Interventions: Assess patient nutrition upon admission and as needed per policy Provide education on elevated blood sugars and impact on wound healing Treatment Activities: Dietary management education, guidance and counseling : 02/02/2022 Notes: Wound/Skin Impairment Nursing Diagnoses: Impaired tissue integrity Goals: Patient/caregiver will verbalize understanding of skin care regimen Date Initiated: 03/06/2022 Target Resolution Date: 06/27/2022 Goal Status:  Active Ulcer/skin breakdown will have a volume reduction of 30% by week 4 Date Initiated: 02/02/2022 Target Resolution Date: 06/27/2022 Goal Status: Active Interventions: Assess ulceration(s) every visit Provide education on ulcer and skin care Treatment Activities: Patient referred to home care : 02/02/2022 Skin care regimen initiated : 02/02/2022 Notes: [name], [name] ([id number]) [id number] Page 4 of 7 Electronic Signature(s) Signed: 05/29/2022 3:48:02 PM By: Samuel Pulling RN, BSN Entered By: [name] on 05/29/2022 10:52:48 -------------------------------------------------------------------------------- Pain Assessment Details Patient Name: Date of Service: [name] 05/29/2022 10:30 A M Medical Record Number: [id number] Patient Account Number: [id number] Date of Birth/Sex: Treating RN: [birth year] (57 y.o. M) Primary Care Keifer Habib: [name] Other Clinician: Referring Mubarak Bevens: Treating Roddrick Sharron/Extender: [name] in Treatment: 16 Active Problems Location of Pain Severity and Description of Pain Patient Has Paino No Site Locations Pain Management and Medication Current Pain Management: Electronic Signature(s) Signed: 05/29/2022 11:32:44 AM By: [name] Entered By: [name] on 05/29/2022 10:44:33 -------------------------------------------------------------------------------- Patient/Caregiver Education Details Patient Name:  Date of Service: [name] 4/30/2024andnbsp10:30 A M Medical Record Number: [id number] Patient Account Number: [id number] Date of Birth/Gender: Treating RN: November 19, 1965 (57 y.o. [name] Primary Care Physician: [name] Other Clinician: Referring Physician: Treating Physician/Extender: [name] in Treatment: 16 Education Assessment Education Provided To: Patient Education Topics Provided Wound/Skin Impairment: Methods:  Explain/Verbal Responses: State content correctly [name], [name] ([id number]) [id number] Page 5 of 7 Electronic Signature(s) Signed: 05/29/2022 3:48:02 PM By: Samuel Pulling RN, BSN Entered By: [name] on 05/29/2022 10:53:03 -------------------------------------------------------------------------------- Wound Assessment Details Patient Name: Date of Service: [name] 05/29/2022 10:30 A M Medical Record Number: [id number] Patient Account Number: [id number] Date of Birth/Sex: Treating RN: [birth year] (57 y.o. [name] Primary Care Aaniya Sterba: [name] Other Clinician: Referring Ruven Corradi: Treating Salvatore Poe/Extender: [name] in Treatment: 16 Wound Status Wound Number: 3 Primary Etiology: Diabetic Wound/Ulcer of the Lower Extremity Wound Location: Right Foot Wound Status: Open Wounding Event: Gradually Appeared Comorbid History: Lymphedema, Hypertension, Type II Diabetes, Neuropathy Date Acquired: 05/29/2022 Weeks Of Treatment: 0 Clustered Wound: No Wound Measurements Length: (cm) 0.5 Width: (cm) 0.2 Depth: (cm) 0.2 Area: (cm) 0.079 Volume: (cm) 0.016 % Reduction in Area: % Reduction in Volume: Epithelialization: None Tunneling: No Undermining: No Wound Description Classification: Grade 2 Exudate Amount: Medium Exudate Type: Serosanguineous Exudate Color: red, brown Foul Odor After Cleansing: No Slough/Fibrino Yes Wound Bed Granulation Amount: None Present (0%) Exposed Structure Necrotic Amount: Large (67-100%) Fascia Exposed: No Necrotic Quality: Eschar, Adherent Slough Fat Layer (Subcutaneous Tissue) Exposed: No Tendon Exposed: No Muscle Exposed: No Joint Exposed: No Bone Exposed: No Periwound Skin Texture Texture Color No Abnormalities Noted: No No Abnormalities Noted: No Moisture No Abnormalities Noted: No Treatment Notes Wound #3 (Foot) Wound Laterality:  Right Cleanser Soap and Water Discharge Instruction: May shower and wash wound with dial antibacterial soap and water prior to dressing change. Peri-Wound Care Topical Primary Dressing Maxorb Extra Calcium Alginate, 2x2 (in/in) Discharge Instruction: Apply to wound bed as instructed Secondary Dressing ALLEVYN Heel 4 1/2in x 5 1/2in / 10.5cm x 13.5cm Discharge Instruction: Apply over primary dressing as directed. ABD Pad, 5x9 Discharge Instruction: Apply over primary dressing as directed. [name], [name] ([id number]) [id number] Page 6 of 7 Optifoam Non-Adhesive Dressing, 4x4 in Discharge Instruction: Apply over primary dressing as directed. Woven Gauze Sponge, Non-Sterile 4x4 in Discharge Instruction: Apply over primary dressing as directed. Secured With 50M Medipore H Soft Cloth Surgical T ape, 4 x 10 (in/yd) Discharge Instruction: Secure with tape as directed. Compression Wrap Compression Stockings Add-Ons Electronic Signature(s) Signed: 05/29/2022 3:48:02 PM By: Samuel Pulling RN, BSN Entered By: [name] on 05/29/2022 12:37:15 -------------------------------------------------------------------------------- Wound Assessment Details Patient Name: Date of Service: [name] 05/29/2022 10:30 A M Medical Record Number: [id number] Patient Account Number: [id number] Date of Birth/Sex: Treating RN: Dec 20, 1965 (57 y.o. 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 Wound Status: Open Wounding Event: Hematoma Comorbid Lymphedema, Hypertension, Type II Diabetes, Neuropathy History: Date Acquired: 05/29/2022 Weeks Of Treatment: 0 Clustered Wound: No Wound  Measurements Length: (cm) 4 Width: (cm) 2.5 Depth: (cm) 1 Area: (cm) 7.854 Volume: (cm) 7.854 % Reduction in Area: % Reduction in Volume: Epithelialization: None Tunneling: No Undermining: No Wound Description Classification: Grade  2 Exudate Amount: Medium Exudate Type: Serosanguineous Exudate Color: red, brown Foul Odor After Cleansing: No Slough/Fibrino Yes Wound Bed Granulation Amount: None Present (0%) Exposed Structure Necrotic Amount: Large (67-100%) Fascia Exposed: No Necrotic Quality: Eschar, Adherent Slough Fat Layer (Subcutaneous Tissue) Exposed: Yes Tendon Exposed: No Muscle Exposed: No Joint Exposed: No Bone Exposed: No Periwound Skin Texture Texture Color No Abnormalities Noted: No No Abnormalities Noted: No Callus: Yes Moisture No Abnormalities Noted: No Treatment Notes Wound #4 (Amputation Site - Transmetatarsal) Wound Laterality: Left [name], [name] ([id number]) [id number] Page 7 of 7 Cleanser Soap and Water Discharge Instruction: May shower and wash wound with dial antibacterial soap and water prior to dressing change. Peri-Wound Care Topical Primary Dressing Maxorb Extra Calcium Alginate, 2x2 (in/in) Discharge Instruction: Apply to wound bed as instructed Secondary Dressing ALLEVYN Heel 4 1/2in x 5 1/2in / 10.5cm x 13.5cm Discharge Instruction: Apply over primary dressing as directed. ABD Pad, 5x9 Discharge Instruction: Apply over primary dressing as directed. Optifoam Non-Adhesive Dressing, 4x4 in Discharge Instruction: Apply over primary dressing as directed. Woven Gauze Sponge, Non-Sterile 4x4 in Discharge Instruction: Apply over primary dressing as directed. Secured With 75M Medipore H Soft Cloth Surgical T ape, 4 x 10 (in/yd) Discharge Instruction: Secure with tape as directed. Compression Wrap Compression Stockings Add-Ons Electronic Signature(s) Signed: 05/29/2022 3:48:02 PM By: Samuel Pulling RN,  BSN Entered By: [name] on 05/29/2022 12:37:23 -------------------------------------------------------------------------------- Vitals Details Patient Name: Date of Service: [name] 05/29/2022 10:30 A M Medical Record Number: [id number] Patient Account Number: [id number] Date of Birth/Sex: Treating RN: 1965/03/21 (57 y.o. M) Primary Care Jenevieve Kirschbaum: [name] Other Clinician: Referring Jamir Rone: Treating Pablo Stauffer/Extender: [name] in Treatment: 16 Vital Signs Time Taken: 10:43 Temperature (F): 99.1 Height (in): 72 Pulse (bpm): 73 Weight (lbs): 214 Respiratory Rate (breaths/min): 20 Body Mass Index (BMI): 29 Blood Pressure (mmHg): 128/79 Capillary Blood Glucose (mg/dl): 782 Reference Range: 80 - 120 mg / dl Electronic Signature(s) Signed: 05/29/2022 11:32:44 AM By: [name] Entered By: [name] on 05/29/2022 10:44:26

## 2022-05-30 NOTE — Progress Notes (Signed)
[place], [name] ([id number]) [id number] Page 1 of 10 Visit Report for 05/29/2022 Chief Complaint Document Details Patient Name: Date of Service: Samuel Most MA R 05/29/2022 10:30 A M Medical Record Number: [id number] Patient Account Number: [id number] Date of Birth/Sex: Treating RN: 1965-09-02 (57 y.o. 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 Cline Cools Primary Care Provider: Marrianne Mood Other Clinician: Referring Provider: Treating Provider/Extender: Alena Bills in Treatment: 16 Debridement Performed for Assessment: Wound #4 Left Amputation Site - Transmetatarsal Performed By: Physician Duanne Guess, MD Debridement Type: Debridement Severity of Tissue Pre Debridement: Fat layer exposed Level of Consciousness (Pre-procedure): Awake and Alert Pre-procedure Verification/Time Out Yes - 10:55 Taken: Start Time: 11:00 Pain Control: Lidocaine 4% Topical Solution Percent of Wound Bed Debrided: 100% T Area Debrided (cm): otal 7.85 Tissue and other material debrided: Non-Viable, Callus, Subcutaneous Level: Skin/Subcutaneous  Tissue Debridement Description: Excisional Instrument: Curette Bleeding: Minimum Hemostasis Achieved: Pressure Procedural Pain: 0 Post Procedural Pain: 0 Response to Treatment: Procedure was tolerated well Level of Consciousness (Post- Awake and Alert procedure): Post Debridement Measurements of Total Wound Length: (cm) 4 Width: (cm) 2.5 Depth: (cm) 1 Volume: (cm) 7.854 Character of Wound/Ulcer Post Debridement: Improved Severity of Tissue Post Debridement: Fat layer exposed Post Procedure Diagnosis Same as Pre-procedure Notes Scribed for Dr Lady Gary by Redmond Pulling, RN [place], [place] ([id number]) [phone].pdf Page 2 of 10 Electronic Signature(s) Signed: 05/29/2022 3:48:02 PM By: Redmond Pulling RN, BSN Signed: 05/29/2022 3:54:11 PM By: Duanne Guess MD FACS Entered By: Redmond Pulling on 05/29/2022 11:18:52 -------------------------------------------------------------------------------- Debridement Details Patient Name: Date of Service: Samuel Most MA R 05/29/2022 10:30 A M Medical Record Number: [id number] Patient Account Number: [id number] Date of Birth/Sex: Treating RN: [birth year] (57 y.o. M) Primary Care Provider: Marrianne Mood Other Clinician: Referring Provider: Treating Provider/Extender: Alena Bills in Treatment: 16 Debridement Performed for Assessment: Wound #3 Right Foot Performed By: Physician Duanne Guess, MD Debridement Type: Debridement Severity of Tissue Pre Debridement: Fat layer exposed Level of Consciousness (Pre-procedure): Awake and Alert Pre-procedure Verification/Time Out Yes - 10:55 Taken: Start Time: 11:00 Pain Control: Lidocaine 4% Topical Solution Percent of Wound Bed Debrided: 100% T Area Debrided (cm): otal 0.08 Tissue and other material debrided: Non-Viable, Callus, Slough, Subcutaneous, Slough Level: Skin/Subcutaneous Tissue Debridement Description: Excisional Instrument:  Curette Bleeding: Minimum Hemostasis Achieved: Pressure Procedural Pain: 0 Post Procedural Pain: 0 Response to Treatment: Procedure was tolerated well Level of Consciousness (Post- Awake and Alert procedure): Post Debridement Measurements of Total Wound Length: (cm) 0.5 Width: (cm) 0.2 Depth: (cm) 0.2 Volume: (cm) 0.016 Character of Wound/Ulcer Post Debridement: Improved Severity of Tissue Post Debridement: Fat layer exposed Post Procedure Diagnosis Same as Pre-procedure Notes Scribed for Dr Lady Gary by Redmond Pulling, RN Electronic Signature(s) Signed: 05/29/2022 12:37:11 PM By: Duanne Guess MD FACS Entered By: Duanne Guess on 05/29/2022 12:37:11 -------------------------------------------------------------------------------- HPI Details Patient Name: Date of Service: O SMA N,  O MA R 05/29/2022 10:30 A M Medical Record Number: [id number] Patient Account Number: [id number] Date of Birth/Sex: Treating RN: Feb 12, 1965 (57 y.o. M) Primary Care Provider: Marrianne Mood Other Clinician: Referring Provider: Treating Provider/Extender: Alena Bills in Treatment: 16 History of Present Illness HPI Description: ADMISSION 02/02/2022 [name] ([id number]) [id number] Page 3 of 10 This is a 57 year old [place] man who speaks only Arabic. The visit today was conducted with the assistance of the language line interpreter; the patient declines the use of an in person interpreter. He is a poorly controlled diabetic (last hemoglobin A1c 10.8, but it has been as high as 15 in the past) with CKD stage III and hypertension. 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 He required revision of the right TMA in October. This was done in the [facility] system. It is not entirely clear how he was referred to our clinic however he has openings on the  distal portion of the right foot as well as drainage coming from the plantar aspect of his left foot underneath some callus. The patient has just been applying dry gauze to both areas. 02/09/2022: The left-sided wound is quite a bit smaller today, but is covered with callus. The right sided wound has also contracted a little bit and is very clean, but there is also callus accumulation around the perimeter. Unfortunately, due to his Medicaid status, we have been unable to secure home health assistance and he has been changing the dressings on his own. He also has difficulty obtaining dressing supplies. 02/19/2022: Both wounds are smaller, particularly on the right. The left is covered with a layer of callus. No concern for infection. 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 There is light slough on the surface with some surrounding callus. The left plantar foot wound has callused over. Underneath the callus, the there is a small opening in the skin. 03/06/2022: The wounds are about the same size this week. There is callus accumulation around each, but the surfaces are cleaner. 03/19/2022: The wound on the right is quite a bit smaller this week. There is minimal callus accumulation and light slough on the surface. The wound on the left is almost completely covered in callus. 03/26/2022: The wound on the right continues to contract and is narrower again this week. The wound on the left has become covered in callus once again. 04/24/2022: In the month since he was last seen, apparently the patient developed some purulent drainage from his foot and was seen by podiatry at [facility]. He was debrided and given antibiotics. Subsequently, his wounds actually look extremely good today. There is callus covering the plantar ulcer on the left. The right TMA site is quite a bit narrower and clean with just some callus and slough accumulation. 05/01/2022: The wound on the plantar surface of  his left foot is closed under a thick layer of callus. On the right, the wound is substantially smaller with just a little bit of slough but also heavy buildup of callus around the wound opening. 05/08/2022: The left plantar foot ulcer remains closed. On the right, the wound continues to narrow significantly. There is still some depth to the wound, but it is clean. 05/22/2022: His wounds are closed. 05/29/2022: The wound on his right foot has reopened. This appears to be secondary to the callus cracking and causing a split in the underlying tissue. In addition, his daughter reports that he has  been complaining of pain in the left foot. There is a fluctuant area at the end of the foot and it appears there is some darker discoloration under the callus that covers the site where the wound was. Electronic Signature(s) Signed: 05/29/2022 12:33:23 PM By: Duanne Guess MD FACS Entered By: Duanne Guess on 05/29/2022 12:33:23 -------------------------------------------------------------------------------- Physical Exam Details Patient Name: Date of Service: Samuel Most MA R 05/29/2022 10:30 A M Medical Record Number: [id number] Patient Account Number: [id number] Date of Birth/Sex: Treating RN: November 09, 1965 (57 y.o. M) Primary Care Provider: Marrianne Mood Other Clinician: Referring Provider: Treating Provider/Extender: Alena Bills in Treatment: 16 Constitutional . . . . no acute distress. Respiratory Normal work of breathing on room air. Notes 05/29/2022: The wound on his right foot has reopened. This appears to be secondary to the callus cracking and causing a split in the underlying tissue. In addition, his daughter reports that he has been complaining of pain in the left foot. There is a fluctuant area at the end of the foot and it appears there is some darker discoloration under the callus that covers the site where the wound was. Electronic Signature(s) Signed:  05/29/2022 12:35:24 PM By: Duanne Guess MD FACS Entered By: Duanne Guess on 05/29/2022 12:35:24 -------------------------------------------------------------------------------- Physician Orders Details Patient Name: Date of Service: Samuel Most MA R 05/29/2022 10:30 A M Medical Record Number: [id number] Patient Account Number: [id number] Date of Birth/Sex: Treating RN: 1965/07/25 (57 y.o. 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 - Dr. Lady Gary Room 1 +++ INTERPRETER [facility] [place] Return A Other: - Doctor's note for spouse for work [employer] May shower and wash wound with soap and water. Non Wound Condition pply the following to affected area as directed: - urea cream 40% to callouses daily (do not put on open areas) A Wound Treatment Wound #3 - Foot Wound Laterality: Right Cleanser: Soap and Water 1 x Per Day/10 Days Discharge Instructions: May shower and wash wound with dial antibacterial soap and water prior to dressing change. Prim Dressing: Maxorb Extra Calcium Alginate, 2x2 (in/in) (Generic) 1 x Per Day/10 Days ary Discharge Instructions: Apply to wound bed as instructed Secondary Dressing: ALLEVYN Heel 4 1/2in x 5 1/2in / 10.5cm x 13.5cm (DME) (Generic) 1 x Per Day/10 Days Discharge Instructions: Apply over primary dressing as  directed. Secondary Dressing: ABD Pad, 5x9 (DME) (Generic) 1 x Per Day/10 Days Discharge Instructions: Apply over primary dressing as directed. Secondary Dressing: Optifoam Non-Adhesive Dressing, 4x4 in 1 x Per Day/10 Days Discharge Instructions: Apply over primary dressing as directed. Secondary Dressing: Woven Gauze Sponge, Non-Sterile 4x4 in (DME) (Generic) 1 x Per Day/10 Days Discharge Instructions: Apply over primary dressing as directed. Secured With: 17M Medipore H Soft Cloth Surgical T ape, 4 x 10 (in/yd) 1 x Per Day/10 Days Discharge Instructions: Secure with tape as directed. Wound #4 - Amputation Site - Transmetatarsal Wound Laterality: Left Cleanser: Soap and Water 1 x Per Day/10 Days Discharge Instructions: May shower  and wash wound with dial antibacterial soap and water prior to dressing change. Prim Dressing: Maxorb Extra Calcium Alginate, 2x2 (in/in) (DME) (Generic) 1 x Per Day/10 Days ary Discharge Instructions: Apply to wound bed as instructed Secondary Dressing: ALLEVYN Heel 4 1/2in x 5 1/2in / 10.5cm x 13.5cm (DME) (Generic) 1 x Per Day/10 Days Discharge Instructions: Apply over primary dressing as directed. Secondary Dressing: ABD Pad, 5x9 1 x Per Day/10 Days Discharge Instructions: Apply over primary dressing as directed. Secondary Dressing: Optifoam Non-Adhesive Dressing, 4x4 in 1 x Per Day/10 Days Discharge Instructions: Apply over primary dressing as directed. Secondary Dressing: Woven Gauze Sponge, Non-Sterile 4x4 in (DME) (Generic) 1 x Per Day/10 Days Discharge Instructions: Apply over primary dressing as directed. Secured With: 49M Medipore H Soft Cloth Surgical T ape, 4 x 10 (in/yd) 1 x Per Day/10 Days Discharge Instructions: Secure with tape as directed. Laboratory naerobe culture (MICRO) - (ICD10 [phone] - Non-pressure chronic ulcer of other part of right Bacteria identified in Unspecified specimen by A foot with muscle involvement without evidence of  necrosis) LOINC Code: 635-3 Convenience Name: Anaerobic culture [name] ([id number]) [id number] Page 5 of 10 Patient Medications llergies: Novolog U-100 Insulin aspart, penicillin A Notifications Medication Indication Start End prior to debridement 05/29/2022 lidocaine DOSE topical 4 % cream - cream topical once daily Electronic Signature(s) Signed: 05/29/2022 3:48:02 PM By: Redmond Pulling RN, BSN Signed: 05/29/2022 3:54:11 PM By: Duanne Guess MD FACS Previous Signature: 05/29/2022 12:36:17 PM Version By: Duanne Guess MD FACS Entered By: Redmond Pulling on 05/29/2022 12:55:35 Prescription 05/29/2022 -------------------------------------------------------------------------------- Dionne Bucy MD Patient Name: Provider: 1965-06-28 [id number] Date of Birth: NPI#: [id number] Sex: DEA #: [phone] Phone #: License #: UPN: Patient Address: [address] Eligha Bridegroom [facility] [place] , [place] [address] [address] [place], [place] [address] [phone] Allergies Novolog U-100 Insulin aspart; penicillin Provider's Orders naerobe culture - ICD10: L97.515 Bacteria identified in Unspecified specimen by A LOINC Code: 635-3 Convenience Name: Anaerobic culture Hand Signature: Date(s): Electronic Signature(s) Signed: 05/29/2022 3:48:02 PM By: Redmond Pulling RN, BSN Signed: 05/29/2022 3:54:11 PM By: Duanne Guess MD FACS Entered By: Redmond Pulling on 05/29/2022 12:55:35 -------------------------------------------------------------------------------- Problem List Details Patient Name: Date of Service: Samuel Most MA R 05/29/2022 10:30 A M Medical Record Number: [id number] Patient Account Number: [id number] Date of Birth/Sex: Treating RN: 07/15/1965 (57 y.o. 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 02/02/2022 No Yes without evidence of necrosis ZACHARY, NOLE ([id number]) [id number] Page 6 of 10 [id number] Non-pressure chronic ulcer of other part of left foot with fat layer exposed 02/02/2022 No Yes E11.621 Type 2 diabetes mellitus with foot ulcer 02/02/2022 No Yes Z89.439 Acquired absence of unspecified foot 02/02/2022 No Yes Inactive Problems Resolved Problems Electronic Signature(s) Signed: 05/29/2022 12:18:24 PM By: Duanne Guess MD FACS Entered By: Duanne Guess on 05/29/2022 12:18:23 -------------------------------------------------------------------------------- Progress Note Details Patient Name: Date of Service: Samuel Most MA R 05/29/2022 10:30 A M Medical Record Number: [id number] Patient Account Number: [id number] Date of Birth/Sex: Treating RN: 1965-03-17 (57 y.o. 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 The visit today was conducted with the assistance of the language line interpreter; the patient declines the use of an in person interpreter. He is a poorly controlled diabetic (last hemoglobin A1c 10.8, but it has been as high as 15 in the past) with CKD stage III and hypertension. 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 He required revision of the right TMA in October. This was done in the [facility] system. It is not entirely clear how he was referred to our clinic however he has openings on the distal portion of the right foot as well as drainage coming from the plantar aspect of his left foot underneath some callus. The patient has just been applying dry gauze to both areas. 02/09/2022: The left-sided wound is quite a bit smaller today, but is covered with callus. The right sided wound has also contracted a little bit and is very clean, but there is also callus accumulation around the perimeter. Unfortunately, due to his Medicaid status, we have been unable to secure home health assistance and he has been changing the dressings on his own. He also has difficulty obtaining dressing supplies. 02/19/2022: Both wounds are smaller, particularly on the right. The left is covered with a layer of callus. No concern for infection. 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 There is light slough on the surface with some surrounding callus. The left plantar foot wound has callused over. Underneath the callus, the there is a small opening in the skin. 03/06/2022: The wounds are about the same size this week. There is callus accumulation around each, but the surfaces are cleaner. 03/19/2022: The wound on the right is quite a bit smaller this week. There is minimal callus accumulation and light slough on the surface. The wound on the left is almost completely covered in callus. 03/26/2022: The wound on the right continues to contract and is narrower again this week. The wound on the left has become covered in callus once again. 04/24/2022: In the month since he was last seen, apparently the patient developed some purulent drainage from his foot and was seen by podiatry at [facility]. He was debrided and given antibiotics.  Subsequently, his wounds actually look extremely good today. There is callus covering the plantar ulcer on the left. The right TMA site is quite a bit narrower and clean with just some callus and slough accumulation. 05/01/2022: The wound on the plantar surface of his left foot is closed under a thick layer of callus. On the right, the wound is substantially smaller with just a little bit of slough but also heavy buildup of callus around the wound opening. 05/08/2022: The left plantar foot ulcer remains closed. On the right, the wound continues to narrow significantly. There is still some depth to the wound, but it is clean. 05/22/2022: His wounds are closed. 05/29/2022: The wound on his right foot has reopened. This appears to be secondary to the callus cracking and causing a split in the underlying tissue. In addition, his daughter reports that he has been complaining of pain in the left foot. There is a fluctuant area at the end of the foot and it appears there is some [place], [name] ([id number]) [id number] Page  7 of 10 darker discoloration under the callus that covers the site where the wound was. Patient History Family History Unknown History, Cancer, No family history of Diabetes, Heart Disease, Hereditary Spherocytosis, Hypertension, Kidney Disease, Lung Disease, Seizures, Stroke, Thyroid Problems, Tuberculosis. Social History Never smoker, Marital Status - Married, Alcohol Use - Never, Drug Use - No History, Caffeine Use - Rarely. Medical History Hematologic/Lymphatic Patient has history of Lymphedema Cardiovascular Patient has history of Hypertension Endocrine Patient has history of Type II Diabetes Neurologic Patient has history of Neuropathy Medical A Surgical History Notes nd Genitourinary AKI Objective Constitutional no acute distress. Vitals Time Taken: 10:43 AM, Height: 72 in, Weight: 214 lbs, BMI: 29, Temperature: 99.1 F, Pulse: 73 bpm,  Respiratory Rate: 20 breaths/min, Blood Pressure: 128/79 mmHg, Capillary Blood Glucose: 199 mg/dl. Respiratory Normal work of breathing on room air. General Notes: 05/29/2022: The wound on his right foot has reopened. This appears to be secondary to the callus cracking and causing a split in the underlying tissue. In addition, his daughter reports that he has been complaining of pain in the left foot. There is a fluctuant area at the end of the foot and it appears there is some darker discoloration under the callus that covers the site where the wound was. Integumentary (Hair, Skin) Wound #3 status is Open. Original cause of wound was Gradually Appeared. The date acquired was: 05/29/2022. The wound is located on the Right Foot. The wound measures 0.5cm length x 0.2cm width x 0.2cm depth; 0.079cm^2 area and 0.016cm^3 volume. There is no tunneling or undermining noted. There is a medium amount of serosanguineous drainage noted. There is no granulation within the wound bed. There is a large (67-100%) amount of necrotic tissue within the wound bed including Eschar and Adherent Slough. Wound #4 status is Open. Original cause of wound was Hematoma. The date acquired was: 05/29/2022. The wound is located on the Left Amputation Site - Transmetatarsal. The wound measures 4cm length x 2.5cm width x 1cm depth; 7.854cm^2 area and 7.854cm^3 volume. There is Fat Layer (Subcutaneous Tissue) exposed. There is no tunneling or undermining noted. There is a medium amount of serosanguineous drainage noted. There is no granulation within the wound bed. There is a large (67-100%) amount of necrotic tissue within the wound bed including Eschar and Adherent Slough. The periwound skin appearance exhibited: Callus. 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 There was a Excisional Skin/Subcutaneous Tissue Debridement with a total area of 0.08 sq cm performed by Duanne Guess, MD. With the following instrument(s): Curette to remove Non-Viable tissue/material. Material removed includes Callus, Subcutaneous Tissue, and Slough after achieving pain [place], [place] ([id number]) [id number] Page 8 of 10 control using Lidocaine 4% Topical Solution. No specimens were taken. A time out was conducted at 10:55, prior to the start of the procedure. A Minimum amount of bleeding was controlled with Pressure. The procedure was tolerated well with a pain level of 0 throughout and a pain level of 0 following the procedure. Post Debridement Measurements: 0.5cm length x 0.2cm width x 0.2cm depth; 0.016cm^3 volume. Character of Wound/Ulcer Post Debridement is  improved. Severity of Tissue Post Debridement is: Fat layer exposed. Post procedure Diagnosis Wound #3: Same as Pre-Procedure General Notes: Scribed for Dr Lady Gary by Redmond Pulling, RN. 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 There was a Excisional Skin/Subcutaneous Tissue Debridement with a total area of 7.85 sq cm performed by Duanne Guess, MD. With the following instrument(s): Curette to remove Non-Viable tissue/material. Material removed includes Callus and Subcutaneous Tissue and after achieving pain control using Lidocaine 4% Topical Solution. No specimens were taken. A time out was conducted at 10:55, prior to the start of the procedure. A  Minimum amount of bleeding was controlled with Pressure. The procedure was tolerated well with a pain level of 0 throughout and a pain level of 0 following the procedure. Post Debridement Measurements: 4cm length x 2.5cm width x 1cm depth; 7.854cm^3 volume. Character of Wound/Ulcer Post Debridement is improved. Severity of Tissue Post Debridement is: Fat layer exposed. Post procedure Diagnosis Wound #4: Same as Pre-Procedure General Notes: Scribed for Dr Lady Gary by Redmond Pulling, RN. Plan Follow-up Appointments: Return Appointment in 1 week. - Dr. 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 Non Wound Condition: Apply the following to affected area as directed: - urea cream 40% to callouses daily (do not put on open areas) Laboratory ordered were: Anaerobic culture The following medication(s) was prescribed: lidocaine topical 4 % cream cream topical once daily for prior to debridement was prescribed at facility WOUND #3: - Foot Wound Laterality: Right Cleanser: Soap and Water 1 x Per Day/10 Days Discharge Instructions: May shower and wash wound with dial antibacterial soap and water prior to dressing change. Prim Dressing: Maxorb Extra Calcium Alginate, 2x2 (in/in) (Generic) 1 x Per Day/10 Days ary Discharge Instructions: Apply to wound bed as instructed Secondary Dressing: ALLEVYN Heel 4 1/2in x 5 1/2in / 10.5cm x 13.5cm (DME) (Generic) 1 x Per Day/10 Days Discharge Instructions: Apply over primary dressing as directed. Secondary Dressing: ABD Pad, 5x9 (DME) (Generic) 1 x Per Day/10 Days Discharge Instructions: Apply over primary dressing as directed. Secondary Dressing: Optifoam Non-Adhesive Dressing, 4x4 in 1 x Per Day/10 Days Discharge Instructions: Apply over primary dressing as directed. Secondary Dressing: Woven Gauze Sponge, Non-Sterile 4x4 in (DME) (Generic) 1 x Per  Day/10 Days Discharge Instructions: Apply over primary dressing as directed. Secured With: 32M Medipore H Soft Cloth Surgical T ape, 4 x 10 (in/yd) 1 x Per Day/10 Days Discharge Instructions: Secure with tape as directed. WOUND #4: - Amputation Site - Transmetatarsal Wound Laterality: Left Cleanser: Soap and Water 1 x Per Day/10 Days Discharge Instructions: May shower and wash wound with dial antibacterial soap and water prior to dressing change. Prim Dressing: Maxorb Extra Calcium Alginate, 2x2 (in/in) (DME) (Generic) 1 x Per Day/10 Days ary Discharge Instructions: Apply to wound bed as instructed Secondary Dressing: ALLEVYN Heel 4 1/2in x 5 1/2in / 10.5cm x 13.5cm (DME) (Generic) 1 x Per Day/10 Days Discharge Instructions: Apply over primary dressing as directed. Secondary Dressing: ABD Pad, 5x9 1 x Per Day/10 Days Discharge Instructions: Apply over primary dressing as directed. Secondary Dressing: Optifoam Non-Adhesive Dressing, 4x4 in 1 x Per Day/10 Days Discharge Instructions: Apply over primary dressing as directed. Secondary Dressing: Woven Gauze Sponge, Non-Sterile 4x4 in (DME) (Generic) 1  x Per Day/10 Days Discharge Instructions: Apply over primary dressing as directed. Secured With: 51M Medipore H Soft Cloth Surgical T ape, 4 x 10 (in/yd) 1 x Per Day/10 Days Discharge Instructions: Secure with tape as directed. 05/29/2022: The wound on his right foot has reopened. This appears to be secondary to the callus cracking and causing a split in the underlying tissue. In addition, his daughter reports that he has been complaining of pain in the left foot. There is a fluctuant area at the end of the foot and it appears there is some darker discoloration under the callus that covers the site where the wound was. Is a curette to debride callus, slough, and subcutaneous tissue from right foot wound. I then began to debride the callus from the left foot. Liquefied hematoma under pressure shot out  of the site. Further debridement revealed a large cavity that appeared to be chronic in nature. Subcutaneous tissue was also debrided from the site. We will need to pack the area with silver alginate as well as apply silver alginate to the right foot. We will resume applying the foam heel cups over the end of his feet and put him back in open toed sandals. He will follow-up in 1 week. Electronic Signature(s) Signed: 05/30/2022 1:20:47 PM By: Duanne Guess MD FACS Signed: 05/30/2022 4:44:49 PM By: Shawn Stall RN, BSN Previous Signature: 05/29/2022 12:44:19 PM Version By: Duanne Guess MD FACS Entered By: Shawn Stall on 05/30/2022 13:06:36 [name] ([id number]) [id number] Page 9 of 10 -------------------------------------------------------------------------------- HxROS Details Patient Name: Date of Service: Samuel Most MA R 05/29/2022 10:30 A M Medical Record Number: [id number] Patient Account Number: [id number] Date of Birth/Sex: Treating RN: 06/18/1965 (57 y.o. M) Primary Care Provider: Marrianne Mood Other Clinician: Referring Provider: Treating Provider/Extender: Alena Bills in Treatment: 16 Hematologic/Lymphatic Medical History: Positive for: Lymphedema Cardiovascular Medical History: Positive for: Hypertension Endocrine Medical History: Positive for: Type II Diabetes Treated with: Insulin, Oral agents Genitourinary Medical History: Past Medical History Notes: AKI Neurologic Medical History: Positive for: Neuropathy Immunizations Pneumococcal Vaccine: Received Pneumococcal Vaccination: Yes Received Pneumococcal Vaccination On or After 60th Birthday: Yes Implantable Devices No devices added Family and Social History Unknown History: Yes; Cancer: Yes; Diabetes: No; Heart Disease: No; Hereditary Spherocytosis: No; Hypertension: No; Kidney Disease: No; Lung Disease: No; Seizures: No; Stroke: No; Thyroid  Problems: No; Tuberculosis: No; Never smoker; Marital Status - Married; Alcohol Use: Never; Drug Use: No History; Caffeine Use: Rarely; Financial Concerns: No; Food, Clothing or Shelter Needs: No; Support System Lacking: No; Transportation Concerns: No Psychologist, prison and probation services) Signed: 05/29/2022 3:54:11 PM By: Duanne Guess MD FACS Entered By: Duanne Guess on 05/29/2022 12:34:57 -------------------------------------------------------------------------------- SuperBill Details Patient Name: Date of Service: Samuel Most MA R 05/29/2022 Medical Record Number: [id number] Patient Account Number: [id number] Date of Birth/Sex: Treating RN: [birth year] (57 y.o. 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 Luffman, Marek ([id number]) [id number] Page 10 of 10 E11.621 Type 2 diabetes mellitus with foot ulcer Z89.439 Acquired absence of unspecified foot Facility Procedures : CPT4 Code: [id number] Description: 11042 - DEB SUBQ TISSUE 20 SQ CM/< ICD-10 Diagnosis Description 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  O96.295 Non-pressure chronic ulcer of other part of left foot with fat layer exposed Modifier: without evidence of n Quantity: 1 ecrosis Physician Procedures : CPT4 Code Description Modifier [id number] 99214 - WC PHYS LEVEL 4 - EST PT 25 ICD-10 Diagnosis Description 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 without evidence of n L97.522 Non-pressure chronic ulcer of  other part of left foot with fat layer exposed  E11.621 Type 2 diabetes mellitus with foot ulcer Z89.439 Acquired absence of unspecified foot Quantity: 1 ecrosis : [id number] 11042 - WC PHYS SUBQ TISS 20 SQ CM ICD-10 Diagnosis Description 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

## 2022-06-05 ENCOUNTER — Ambulatory Visit (HOSPITAL_BASED_OUTPATIENT_CLINIC_OR_DEPARTMENT_OTHER): Payer: Medicaid Other | Admitting: General Surgery

## 2022-06-06 ENCOUNTER — Encounter (HOSPITAL_BASED_OUTPATIENT_CLINIC_OR_DEPARTMENT_OTHER): Payer: Medicaid Other | Attending: General Surgery | Admitting: General Surgery

## 2022-06-06 DIAGNOSIS — I129 Hypertensive chronic kidney disease with stage 1 through stage 4 chronic kidney disease, or unspecified chronic kidney disease: Secondary | ICD-10-CM | POA: Insufficient documentation

## 2022-06-06 DIAGNOSIS — L97522 Non-pressure chronic ulcer of other part of left foot with fat layer exposed: Secondary | ICD-10-CM | POA: Diagnosis not present

## 2022-06-06 DIAGNOSIS — E1122 Type 2 diabetes mellitus with diabetic chronic kidney disease: Secondary | ICD-10-CM | POA: Diagnosis not present

## 2022-06-06 DIAGNOSIS — E1140 Type 2 diabetes mellitus with diabetic neuropathy, unspecified: Secondary | ICD-10-CM | POA: Diagnosis not present

## 2022-06-06 DIAGNOSIS — Z7984 Long term (current) use of oral hypoglycemic drugs: Secondary | ICD-10-CM | POA: Insufficient documentation

## 2022-06-06 DIAGNOSIS — Z89431 Acquired absence of right foot: Secondary | ICD-10-CM | POA: Insufficient documentation

## 2022-06-06 DIAGNOSIS — Z89432 Acquired absence of left foot: Secondary | ICD-10-CM | POA: Insufficient documentation

## 2022-06-06 DIAGNOSIS — Z89439 Acquired absence of unspecified foot: Secondary | ICD-10-CM | POA: Insufficient documentation

## 2022-06-06 DIAGNOSIS — E11621 Type 2 diabetes mellitus with foot ulcer: Secondary | ICD-10-CM | POA: Diagnosis present

## 2022-06-06 DIAGNOSIS — N183 Chronic kidney disease, stage 3 unspecified: Secondary | ICD-10-CM | POA: Diagnosis not present

## 2022-06-06 DIAGNOSIS — Z794 Long term (current) use of insulin: Secondary | ICD-10-CM | POA: Insufficient documentation

## 2022-06-06 DIAGNOSIS — L97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 without evidence of necrosis: Secondary | ICD-10-CM | POA: Diagnosis not present

## 2022-06-06 NOTE — Progress Notes (Signed)
[address] [place], [name] ([id number]) [id number] Page 1 of 9 Visit Report for 06/06/2022 Arrival Information Details Patient Name: Date of Service: [name] 06/06/2022 12:30 PM Medical Record Number: [id number] Patient Account Number: [id number] Date of Birth/Sex: Treating RN: Oct 31, 1965 (57 y.o. M) Primary Care Fines Kimberlin: Marrianne Mood Other Clinician: Referring Kaisy Severino: Treating Tiyonna Sardinha/Extender: Alena Bills in Treatment: 17 Visit Information History Since Last Visit All ordered tests and consults were completed: No Patient Arrived: Ambulatory Added or deleted any medications: No Arrival Time: 12:43 Any new allergies or adverse reactions: No Accompanied By: wife Had a fall or experienced change in No Transfer Assistance: None activities of daily living that may affect Patient Identification Verified: Yes risk of falls: Secondary Verification Process Completed: Yes Signs or symptoms of abuse/neglect since last visito No Patient Requires Transmission-Based Precautions: No Hospitalized since last visit: No Patient Has Alerts: No Implantable device outside of the clinic excluding No cellular tissue based products placed in the center since last visit: Has Dressing in Place as Prescribed: Yes Pain Present Now: No Electronic Signature(s) Signed: 06/06/2022 4:26:39 PM By: Zenaida Deed RN, BSN Entered By: Zenaida Deed on 06/06/2022 12:51:47 -------------------------------------------------------------------------------- Encounter Discharge Information Details Patient Name: Date of Service: [name] 06/06/2022 12:30 PM Medical Record Number: [id number] Patient Account Number: [id number] Date of Birth/Sex: Treating RN: 1965/06/06 (57 y.o. [name] Primary Care Adelise Buswell: Marrianne Mood Other Clinician: Referring Jaclin Finks: Treating Kamil Mchaffie/Extender: Alena Bills in Treatment:  17 Encounter Discharge Information Items Post Procedure Vitals Discharge Condition: Stable Temperature (F): 99.2 Ambulatory Status: Ambulatory Pulse (bpm): 99 Discharge Destination: Home Respiratory Rate (breaths/min): 18 Transportation: Private Auto Blood Pressure (mmHg): 133/85 Accompanied By: spouse Schedule Follow-up Appointment: Yes Clinical Summary of Care: Patient Declined Electronic Signature(s) Signed: 06/06/2022 4:26:39 PM By: Zenaida Deed RN, BSN Entered By: Zenaida Deed on 06/06/2022 13:31:20 -------------------------------------------------------------------------------- Lower Extremity Assessment Details Patient Name: Date of Service: [name] 06/06/2022 12:30 PM Medical Record Number: [id number] Patient Account Number: [id number] Date of Birth/Sex: Treating RN: [birth year] (57 y.o. [name] Primary Care Anabia Weatherwax: Marrianne Mood Other Clinician: Referring Roselee Tayloe: Treating Lilinoe Acklin/Extender: Alena Bills in Treatment: 17 Edema Assessment O[LeftHelane Gunther, [name] ([id number])] [name]: [id number] Page 2 of 9] Assessed: [Left: No] [Right: No] Edema: [Left: No] [Right: No] Calf Left: Right: Point of Measurement: From Medial Instep 36.5 cm 37.5 cm Ankle Left: Right: Point of Measurement: From Medial Instep 25 cm 24 cm Vascular Assessment Pulses: Dorsalis Pedis Palpable: [Left:Yes] [Right:Yes] Electronic Signature(s) Signed: 06/06/2022 4:26:39 PM By: Zenaida Deed RN, BSN Entered By: Zenaida Deed on 06/06/2022 12:53:15 -------------------------------------------------------------------------------- Multi Wound Chart Details Patient Name: Date of Service: [name] 06/06/2022 12:30 PM Medical Record Number: [id number] Patient Account Number: [id number] Date of Birth/Sex: Treating RN: March 22, 1965 (57 y.o. M) Primary Care Indiyah Paone: Marrianne Mood Other Clinician: Referring  Maicee Ullman: Treating Alisyn Lequire/Extender: Alena Bills in Treatment: 17 Vital Signs Height(in): 72 Capillary Blood Glucose(mg/dl): 573 Weight(lbs): 220 Pulse(bpm): 99 Body Mass Index(BMI): 29 Blood Pressure(mmHg): 133/85 Temperature(F): 99.2 Respiratory Rate(breaths/min): 20 [3:Photos:] [N/A:N/A] Right Foot Left Amputation Site - Transmetatarsal N/A Wound Location: Gradually Appeared Hematoma N/A Wounding Event: Diabetic Wound/Ulcer of the Lower Diabetic Wound/Ulcer of the Lower N/A Primary Etiology: Extremity Extremity Lymphedema, Hypertension, Type II Lymphedema, Hypertension, Type II N/A Comorbid History: Diabetes, Neuropathy Diabetes, Neuropathy 05/29/2022 05/29/2022 N/A Date Acquired: 1 1 N/A Weeks of Treatment: Open Open N/A Wound Status: No  No N/A Wound Recurrence: 1.2x0.2x0.3 0.7x0.4x0.7 N/A Measurements L x W x D (cm) 0.188 0.22 N/A A (cm) : rea 0.057 0.154 N/A Volume (cm) : -138.00% 97.20% N/A % Reduction in A rea: -256.20% 98.00% N/A % Reduction in Volume: 12 Starting Position 1 (o'clock): 12 Ending Position 1 (o'clock): 0.8 Maximum Distance 1 (cm): No Yes N/A Undermining: Grade 2 Grade 2 N/A Classification: Medium Medium N/A Exudate A mount: Serosanguineous Sanguinous N/A Exudate Type: red, brown red N/A Exudate Color: Thickened Well defined, not attached N/A Wound Margin: Isaac Laud ([id number]) [id number] Page 3 of 9 Large (67-100%) Large (67-100%) N/A Granulation Amount: Red Red N/A Granulation Quality: Small (1-33%) Small (1-33%) N/A Necrotic Amount: Eschar Adherent Slough N/A Necrotic Tissue: Fat Layer (Subcutaneous Tissue): Yes Fat Layer (Subcutaneous Tissue): Yes N/A Exposed Structures: Fascia: No Fascia: No Tendon: No Tendon: No Muscle: No Muscle: No Joint: No Joint: No Bone: No Bone: No Small (1-33%) None N/A Epithelialization: Debridement - Excisional  Debridement - Excisional N/A Debridement: Pre-procedure Verification/Time Out 13:00 13:00 N/A Taken: Lidocaine 4% Topical Solution Lidocaine 4% Topical Solution N/A Pain Control: Callus, Subcutaneous Callus, Subcutaneous N/A Tissue Debrided: Skin/Subcutaneous Tissue Skin/Subcutaneous Tissue N/A Level: 0.38 2.04 N/A Debridement A (sq cm): rea Curette Curette N/A Instrument: Minimum Minimum N/A Bleeding: Pressure Pressure N/A Hemostasis A chieved: 0 0 N/A Procedural Pain: 0 0 N/A Post Procedural Pain: Procedure was tolerated well Procedure was tolerated well N/A Debridement Treatment Response: 1.2x0.2x0.2 1.3x1x0.4 N/A Post Debridement Measurements L x W x D (cm) 0.038 0.408 N/A Post Debridement Volume: (cm) Callus: Yes Callus: Yes N/A Periwound Skin Texture: Dry/Scaly: Yes Dry/Scaly: Yes N/A Periwound Skin Moisture: No Abnormalities Noted No Abnormalities Noted N/A Periwound Skin Color: N/A No Abnormality N/A Temperature: N/A Yes N/A Tenderness on Palpation: Debridement Debridement N/A Procedures Performed: Treatment Notes Wound #3 (Foot) Wound Laterality: Right Cleanser Soap and Water Discharge Instruction: May shower and wash wound with dial antibacterial soap and water prior to dressing change. Peri-Wound Care Topical Primary Dressing Maxorb Extra Calcium Alginate, 2x2 (in/in) Discharge Instruction: Apply to wound bed as instructed Secondary Dressing Optifoam Non-Adhesive Dressing, 4x4 in Discharge Instruction: Apply over primary dressing cut to make foam donut Woven Gauze Sponge, Non-Sterile 4x4 in Discharge Instruction: Apply over primary dressing as directed. Secured With 79M Medipore H Soft Cloth Surgical T ape, 4 x 10 (in/yd) Discharge Instruction: Secure with tape as directed. Compression Wrap Compression Stockings Add-Ons Wound #4 (Amputation Site - Transmetatarsal) Wound Laterality: Left Cleanser Soap and Water Discharge Instruction: May  shower and wash wound with dial antibacterial soap and water prior to dressing change. Peri-Wound Care Topical Primary Dressing Maxorb Extra Calcium Alginate, 2x2 (in/in) Discharge Instruction: Apply to wound bed as instructed [facility], [name] ([id number]) [phone].pdf Page 4 of 9 Secondary Dressing Optifoam Non-Adhesive Dressing, 4x4 in Discharge Instruction: Apply over primary dressing cut to make foam donut Woven Gauze Sponge, Non-Sterile 4x4 in Discharge Instruction: Apply over primary dressing as directed. Secured With 79M Medipore H Soft Cloth Surgical T ape, 4 x 10 (in/yd) Discharge Instruction: Secure with tape as directed. Compression Wrap Compression Stockings Add-Ons Electronic Signature(s) Signed: 06/06/2022 1:39:32 PM By: Duanne Guess MD FACS Entered By: Duanne Guess on 06/06/2022 13:39:32 -------------------------------------------------------------------------------- Multi-Disciplinary Care Plan Details Patient Name: Date of Service: [name] 06/06/2022 12:30 PM Medical Record Number: [id number] Patient Account Number: [id number] Date of Birth/Sex: Treating RN: [birth year] (58 y.o. [name] Primary Care Jackqulyn Mendel: Marrianne Mood Other Clinician: Referring Theodoros Stjames: Treating Mykeal Carrick/Extender: Duanne Guess  Patrcia Dolly in Treatment: 17 Multidisciplinary Care Plan reviewed with physician Active Inactive Nutrition Nursing Diagnoses: Impaired glucose control: actual or potential Goals: Patient/caregiver verbalizes understanding of need to maintain therapeutic glucose control per primary care physician Date Initiated: 02/02/2022 Target Resolution Date: 06/27/2022 Goal Status: Active Interventions: Assess patient nutrition upon admission and as needed per policy Provide education on elevated blood sugars and impact on wound healing Treatment Activities: Dietary management education, guidance and counseling :  02/02/2022 Notes: Wound/Skin Impairment Nursing Diagnoses: Impaired tissue integrity Goals: Patient/caregiver will verbalize understanding of skin care regimen Date Initiated: 03/06/2022 Target Resolution Date: 06/27/2022 Goal Status: Active Ulcer/skin breakdown will have a volume reduction of 30% by week 4 Date Initiated: 02/02/2022 Date Inactivated: 06/06/2022 Target Resolution Date: 06/27/2022 Unmet Reason: continues to grow Goal Status: Unmet callous Interventions: Assess ulceration(s) every visit Provide education on ulcer and skin care Treatment Activities: NOVEL, GLASSCO ([id number]) [id number] Page 5 of 9 Patient referred to home care : 02/02/2022 Skin care regimen initiated : 02/02/2022 Notes: Electronic Signature(s) Signed: 06/06/2022 4:26:39 PM By: Zenaida Deed RN, BSN Entered By: Zenaida Deed on 06/06/2022 12:56:05 -------------------------------------------------------------------------------- Pain Assessment Details Patient Name: Date of Service: [name] 06/06/2022 12:30 PM Medical Record Number: [id number] Patient Account Number: [id number] Date of Birth/Sex: Treating RN: [birth year] (57 y.o. M) Primary Care Jaidyn Usery: Marrianne Mood Other Clinician: Referring Luddie Boghosian: Treating Traves Majchrzak/Extender: Alena Bills in Treatment: 17 Active Problems Location of Pain Severity and Description of Pain Patient Has Paino No Site Locations With Dressing Change: No Duration of the Pain. Constant / Intermittento Intermittent Rate the pain. Current Pain Level: 0 Worst Pain Level: 3 Least Pain Level: 0 Character of Pain Describe the Pain: Tender Pain Management and Medication Current Pain Management: Is the Current Pain Management Adequate: Adequate How does your wound impact your activities of daily livingo Sleep: No Bathing: No Appetite: No Relationship With Others: No Bladder Continence: No Emotions: No Bowel  Continence: No Work: No Toileting: No Drive: No Dressing: No Hobbies: No Notes reports both feet tender when walking Electronic Signature(s) Signed: 06/06/2022 4:26:39 PM By: Zenaida Deed RN, BSN Entered By: Zenaida Deed on 06/06/2022 12:53:07 -------------------------------------------------------------------------------- Patient/Caregiver Education Details Patient Name: Date of Service: [name] 5/8/2024andnbsp12:30 PM Medical Record Number: [id number] Patient Account Number: [id number] [place], [name] ([id number]) [id number].pdf Page 6 of 9 Date of Birth/Gender: Treating RN: 1965-07-08 (57 y.o. [name] Primary Care Physician: Marrianne Mood Other Clinician: Referring Physician: Treating Physician/Extender: Alena Bills in Treatment: 17 Education Assessment Education Provided To: Patient Education Topics Provided Elevated Blood Sugar/ Impact on Healing: Methods: Explain/Verbal Responses: Reinforcements needed, State content correctly Offloading: Methods: Explain/Verbal Responses: Reinforcements needed, State content correctly Wound/Skin Impairment: Methods: Explain/Verbal Responses: Reinforcements needed, State content correctly Electronic Signature(s) Signed: 06/06/2022 4:26:39 PM By: Zenaida Deed RN, BSN Entered By: Zenaida Deed on 06/06/2022 12:56:34 -------------------------------------------------------------------------------- Wound Assessment Details Patient Name: Date of Service: [name] 06/06/2022 12:30 PM Medical Record Number: [id number] Patient Account Number: [id number] Date of Birth/Sex: Treating RN: Jan 18, 1966 (57 y.o. [name] Primary Care Milika Ventress: Marrianne Mood Other Clinician: Referring Nichollas Perusse: Treating Arynn Armand/Extender: Alena Bills in Treatment: 17 Wound Status Wound Number: 3 Primary Etiology: Diabetic Wound/Ulcer of  the Lower Extremity Wound Location: Right Foot Wound Status: Open Wounding Event: Gradually Appeared Comorbid History: Lymphedema, Hypertension, Type II Diabetes, Neuropathy Date Acquired: 05/29/2022 Weeks Of Treatment: 1 Clustered Wound: No Photos Wound Measurements  Length: (cm) 1.2 Width: (cm) 0.2 Depth: (cm) 0.3 Area: (cm) 0.188 Volume: (cm) 0.057 % Reduction in Area: -138% % Reduction in Volume: -256.2% Epithelialization: Small (1-33%) Tunneling: No Undermining: No Wound Description Classification: Grade 2 Hernandez, Samuel ([id number]) Wound Margin: Thickened Exudate Amount: Medium Exudate Type: Serosanguineous Exudate Color: red, brown Foul Odor After Cleansing: No [id number] Page 7 of 9 Slough/Fibrino Yes Wound Bed Granulation Amount: Large (67-100%) Exposed Structure Granulation Quality: Red Fascia Exposed: No Necrotic Amount: Small (1-33%) Fat Layer (Subcutaneous Tissue) Exposed: Yes Necrotic Quality: Eschar Tendon Exposed: No Muscle Exposed: No Joint Exposed: No Bone Exposed: No Periwound Skin Texture Texture Color No Abnormalities Noted: No No Abnormalities Noted: Yes Callus: Yes Moisture No Abnormalities Noted: No Dry / Scaly: Yes Treatment Notes Wound #3 (Foot) Wound Laterality: Right Cleanser Soap and Water Discharge Instruction: May shower and wash wound with dial antibacterial soap and water prior to dressing change. Peri-Wound Care Topical Primary Dressing Maxorb Extra Calcium Alginate, 2x2 (in/in) Discharge Instruction: Apply to wound bed as instructed Secondary Dressing Optifoam Non-Adhesive Dressing, 4x4 in Discharge Instruction: Apply over primary dressing cut to make foam donut Woven Gauze Sponge, Non-Sterile 4x4 in Discharge Instruction: Apply over primary dressing as directed. Secured With 67M Medipore H Soft Cloth Surgical T ape, 4 x 10 (in/yd) Discharge Instruction: Secure with tape as  directed. Compression Wrap Compression Stockings Add-Ons Electronic Signature(s) Signed: 06/06/2022 4:26:39 PM By: Zenaida Deed RN, BSN Entered By: Zenaida Deed on 06/06/2022 12:54:23 -------------------------------------------------------------------------------- Wound Assessment Details Patient Name: Date of Service: [name] 06/06/2022 12:30 PM Medical Record Number: [id number] Patient Account Number: [id number] Date of Birth/Sex: Treating RN: 1965/10/20 (57 y.o. [name] Primary Care Clela Hagadorn: Marrianne Mood Other Clinician: Referring Kellon Chalk: Treating Emmerson Shuffield/Extender: Alena Bills in Treatment: 17 Wound Status Wound Number: 4 Primary Etiology: Diabetic Wound/Ulcer of the Lower Extremity Wound Location: Left Amputation Site - Transmetatarsal Wound Status: Open Wounding Event: Hematoma Comorbid Lymphedema, Hypertension, Type II Diabetes, Neuropathy HistoryREAKWON, [name] ([id number]) [id number] Page 8 of 9 History: Date Acquired: 05/29/2022 Weeks Of Treatment: 1 Clustered Wound: No Photos Wound Measurements Length: (cm) 0.7 Width: (cm) 0.4 Depth: (cm) 0.7 Area: (cm) 0.22 Volume: (cm) 0.154 % Reduction in Area: 97.2% % Reduction in Volume: 98% Epithelialization: None Tunneling: No Undermining: Yes Starting Position (o'clock): 12 Ending Position (o'clock): 12 Maximum Distance: (cm) 0.8 Wound Description Classification: Grade 2 Wound Margin: Well defined, not attached Exudate Amount: Medium Exudate Type: Sanguinous Exudate Color: red Foul Odor After Cleansing: No Slough/Fibrino Yes Wound Bed Granulation Amount: Large (67-100%) Exposed Structure Granulation Quality: Red Fascia Exposed: No Necrotic Amount: Small (1-33%) Fat Layer (Subcutaneous Tissue) Exposed: Yes Necrotic Quality: Adherent Slough Tendon Exposed: No Muscle Exposed: No Joint Exposed: No Bone Exposed: No Periwound  Skin Texture Texture Color No Abnormalities Noted: No No Abnormalities Noted: Yes Callus: Yes Temperature / Pain Temperature: No Abnormality Moisture No Abnormalities Noted: No Tenderness on Palpation: Yes Dry / Scaly: Yes Treatment Notes Wound #4 (Amputation Site - Transmetatarsal) Wound Laterality: Left Cleanser Soap and Water Discharge Instruction: May shower and wash wound with dial antibacterial soap and water prior to dressing change. Peri-Wound Care Topical Primary Dressing Maxorb Extra Calcium Alginate, 2x2 (in/in) Discharge Instruction: Apply to wound bed as instructed Secondary Dressing Optifoam Non-Adhesive Dressing, 4x4 in Discharge Instruction: Apply over primary dressing cut to make foam donut Woven Gauze Sponge, Non-Sterile 4x4 in Discharge Instruction: Apply over primary dressing as directed. [place], [name] ([id number]) [id number] Page 9 of  9 Secured With 66M Medipore H Soft Cloth Surgical T ape, 4 x 10 (in/yd) Discharge Instruction: Secure with tape as directed. Compression Wrap Compression Stockings Add-Ons Electronic Signature(s) Signed: 06/06/2022 4:26:39 PM By: Zenaida Deed RN, BSN Entered By: Zenaida Deed on 06/06/2022 12:55:18 -------------------------------------------------------------------------------- Vitals Details Patient Name: Date of Service: [name] 06/06/2022 12:30 PM Medical Record Number: [id number] Patient Account Number: [id number] Date of Birth/Sex: Treating RN: [birth year] (57 y.o. M) Primary Care Jessee Mezera: Marrianne Mood Other Clinician: Referring Chavonne Sforza: Treating Derryck Shahan/Extender: Alena Bills in Treatment: 17 Vital Signs Time Taken: 12:43 Temperature (F): 99.2 Height (in): 72 Pulse (bpm): 99 Weight (lbs): 214 Respiratory Rate (breaths/min): 20 Body Mass Index (BMI): 29 Blood Pressure (mmHg): 133/85 Capillary Blood Glucose (mg/dl): 409 Reference Range: 80  - 120 mg / dl Notes glucose per pt report this am Electronic Signature(s) Signed: 06/06/2022 4:26:39 PM By: Zenaida Deed RN, BSN Entered By: Zenaida Deed on 06/06/2022 12:52:02

## 2022-06-06 NOTE — Progress Notes (Signed)
Rigby, [name] ([id number]) [id number] Page 1 of 9 Visit Report for 06/06/2022 Chief Complaint Document Details Patient Name: Date of Service: Samuel Most MA R 06/06/2022 12:30 PM Medical Record Number: [id number] Patient Account Number: [id number] Date of Birth/Sex: Treating RN: Jun 10, 1965 (57 y.o. 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 [name] Primary Care Provider: Marrianne Mood Other Clinician: Referring Provider: Treating Provider/Extender: Alena Bills in Treatment: 17 Debridement Performed for Assessment: Wound #3 Right Foot Performed By: Physician Duanne Guess, MD Debridement Type: Debridement Severity of Tissue Pre Debridement: Fat layer exposed Level of Consciousness (Pre-procedure): Awake and Alert Pre-procedure Verification/Time Out Yes - 13:00 Taken: Start Time: 13:02 Pain Control: Lidocaine 4% T opical Solution Percent of Wound Bed Debrided: 200% T Area Debrided (cm): otal 0.38 Tissue and other material debrided: Viable, Non-Viable, Callus, Subcutaneous, Skin: Epidermis Level: Skin/Subcutaneous  Tissue Debridement Description: Excisional Instrument: Curette Bleeding: Minimum Hemostasis Achieved: Pressure Procedural Pain: 0 Post Procedural Pain: 0 Response to Treatment: Procedure was tolerated well Level of Consciousness (Post- Awake and Alert procedure): Post Debridement Measurements of Total Wound Length: (cm) 1.2 Width: (cm) 0.2 Depth: (cm) 0.2 Volume: (cm) 0.038 Character of Wound/Ulcer Post Debridement: Improved Severity of Tissue Post Debridement: Fat layer exposed Post Procedure Diagnosis Same as Pre-procedure Notes Scribed for Dr Lady Gary by Zenaida Deed, RN [place], [place] ([id number]) [id number] Page 2 of 9 Electronic Signature(s) Signed: 06/06/2022 4:23:55 PM By: Duanne Guess MD FACS Signed: 06/06/2022 4:26:39 PM By: Zenaida Deed RN, BSN Entered By: Zenaida Deed on 06/06/2022 13:15:39 -------------------------------------------------------------------------------- Debridement Details Patient Name: Date of Service: Samuel Most MA R 06/06/2022 12:30 PM Medical Record Number: [id number] Patient Account Number: [id number] Date of Birth/Sex: Treating RN: 03/04/1965 (57 y.o. [name] Primary Care Provider: Marrianne Mood Other Clinician: Referring Provider: Treating Provider/Extender: Alena Bills in Treatment: 17 Debridement Performed for Assessment: Wound #4 Left Amputation Site - Transmetatarsal Performed By: Physician Duanne Guess, MD Debridement Type: Debridement Severity of Tissue Pre Debridement: Fat layer exposed Level of Consciousness (Pre-procedure): Awake and Alert Pre-procedure Verification/Time Out Yes - 13:00 Taken: Start Time: 13:02 Pain Control: Lidocaine 4% T opical Solution Percent of Wound Bed Debrided: 200% T Area Debrided (cm): otal 2.04 Tissue and other material debrided: Viable, Non-Viable, Callus, Subcutaneous, Skin: Epidermis Level: Skin/Subcutaneous  Tissue Debridement Description: Excisional Instrument: Curette Bleeding: Minimum Hemostasis Achieved: Pressure Procedural Pain: 0 Post Procedural Pain: 0 Response to Treatment: Procedure was tolerated well Level of Consciousness (Post- Awake and Alert procedure): Post Debridement Measurements of Total Wound Length: (cm) 1.3 Width: (cm) 1 Depth: (cm) 0.4 Volume: (cm) 0.408 Character of Wound/Ulcer Post Debridement: Improved Severity of Tissue Post Debridement: Fat layer exposed Post Procedure Diagnosis Same as Pre-procedure Notes Scribed for Dr Lady Gary by Zenaida Deed, RN Electronic Signature(s) Signed: 06/06/2022 4:23:55 PM By: Duanne Guess MD FACS Signed: 06/06/2022 4:26:39 PM By: Zenaida Deed RN, BSN Entered By: Zenaida Deed  on 06/06/2022 13:26:39 -------------------------------------------------------------------------------- HPI Details Patient Name: Date of Service: Samuel Most MA R 06/06/2022 12:30 PM Medical Record Number: [id number] Patient Account Number: [id number] Date of Birth/Sex: Treating RN: [birth year] (57 y.o. M) Primary Care Provider: Marrianne Mood Other Clinician: Referring Provider: Treating Provider/Extender: Alena Bills in Treatment: 17 History of Present Illness HPI Description: ADMISSION [name] ([id number]) [id number] Page 3 of 9 02/02/2022 This is a 57 year old [place] man who speaks only Arabic. The visit today was conducted with the assistance of the language line interpreter; the patient declines the use of an in person interpreter. He is a poorly controlled diabetic (last hemoglobin A1c 10.8, but it has been as high as 15 in the past) with CKD stage III and hypertension. 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 He required revision of the right TMA in October. This was done in the  [facility] system. It is not entirely clear how he was referred to our clinic however he has openings on the distal portion of the right foot as well as drainage coming from the plantar aspect of his left foot underneath some callus. The patient has just been applying dry gauze to both areas. 02/09/2022: The left-sided wound is quite a bit smaller today, but is covered with callus. The right sided wound has also contracted a little bit and is very clean, but there is also callus accumulation around the perimeter. Unfortunately, due to his Medicaid status, we have been unable to secure home health assistance and he has been changing the dressings on his own. He also has difficulty obtaining dressing supplies. 02/19/2022: Both wounds are smaller, particularly on the right. The left is covered with a layer of callus. No concern for infection. 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 There is light slough on the surface with some surrounding callus. The left plantar foot wound has callused over. Underneath the callus, the there is a small opening in the skin. 03/06/2022: The wounds are about the same size this week. There is callus accumulation around each, but the surfaces are cleaner. 03/19/2022: The wound on the right is quite a bit smaller this week. There is minimal callus accumulation and light slough on the surface. The wound on the left is almost completely covered in callus. 03/26/2022: The wound on the right continues to contract and is narrower again this week. The wound on the left has become covered in callus once again. 04/24/2022: In the month since he was last seen, apparently the patient developed some purulent drainage from his foot and was seen by podiatry at [facility]. He was debrided and given antibiotics. Subsequently, his wounds actually look extremely good today. There is callus covering the plantar ulcer on the left. The right TMA site is quite a  bit narrower and clean with just some callus and slough accumulation. 05/01/2022: The wound on the plantar surface of his left foot is closed under a thick layer of callus. On the right, the wound is substantially smaller with just a little bit of slough but also heavy buildup of callus around the wound opening. 05/08/2022: The left plantar foot ulcer remains closed. On the right, the wound continues to narrow significantly. There is still some depth to the wound, but it is clean. 05/22/2022: His wounds are closed. 05/29/2022: The wound on his right foot has reopened. This appears to be secondary to the callus cracking and causing a  split in the underlying tissue. In addition, his daughter reports that he has been complaining of pain in the left foot. There is a fluctuant area at the end of the foot and it appears there is some darker discoloration under the callus that covers the site where the wound was. 06/06/2022: The wound on his right foot has healed down to a narrow slit underneath a truly impressive amount of callus. The left foot has also tried to cover the wounded area with callus, but this is more open. There is slough buildup and some undermining secondary to the callus accumulation. The culture that I took last week only returned with very low levels of a variety of bacteria species and no antibiotics were prescribed. His foot is much less red and warm today. Electronic Signature(s) Signed: 06/06/2022 1:41:30 PM By: Duanne Guess MD FACS Entered By: Duanne Guess on 06/06/2022 13:41:30 -------------------------------------------------------------------------------- Physical Exam Details Patient Name: Date of Service: Samuel Most MA R 06/06/2022 12:30 PM Medical Record Number: [id number] Patient Account Number: [id number] Date of Birth/Sex: Treating RN: March 16, 1965 (57 y.o. M) Primary Care Provider: Marrianne Mood Other Clinician: Referring Provider: Treating Provider/Extender: Alena Bills in Treatment: 17 Constitutional . . . . no acute distress. Respiratory Normal work of breathing on room air. Notes 06/06/2022: The wound on his right foot has healed down to a narrow slit underneath a truly impressive amount of callus. The left foot has also tried to cover the wounded area with callus, but this is more open. There is slough buildup and some undermining secondary to the callus accumulation. Electronic Signature(s) Signed: 06/06/2022 1:43:22 PM By: Duanne Guess MD FACS Entered By: Duanne Guess on 06/06/2022 13:43:22 -------------------------------------------------------------------------------- Physician Orders Details Patient Name: Date of Service: Samuel Most MA R 06/06/2022 12:30 PM Medical Record Number: [id number] Patient Account Number: [id number] [name], [name] ([id number]) [id number].pdf Page 4 of 9 Date of Birth/Sex: Treating RN: 02-18-1965 (57 y.o. 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 - Dr. Lady Gary Room 1 +++ INTERPRETER [facility] Return A Other: - Doctor's note for spouse for work [employer] May shower and wash wound with soap and water. Non Wound Condition pply the following to affected area as directed: - urea cream 40% to callouses daily (do not put on open areas) A Wound Treatment Wound #3 - Foot Wound Laterality:  Right Cleanser: Soap and Water 1 x Per Day/10 Days Discharge Instructions: May shower and wash wound with dial antibacterial soap and water prior to dressing change. Prim Dressing: Maxorb Extra Calcium Alginate, 2x2 (in/in) (Generic) 1 x Per Day/10 Days ary Discharge Instructions: Apply to wound bed as instructed Secondary Dressing: Optifoam Non-Adhesive Dressing, 4x4 in 1 x Per Day/10 Days Discharge Instructions: Apply over primary dressing cut to make foam donut Secondary Dressing: Woven Gauze Sponge, Non-Sterile 4x4 in 1 x Per Day/10 Days Discharge Instructions: Apply over primary dressing as directed. Secured With: 14M Medipore H Soft Cloth Surgical T ape, 4 x 10 (in/yd) 1 x Per Day/10 Days Discharge Instructions: Secure  with tape as directed. Wound #4 - Amputation Site - Transmetatarsal Wound Laterality: Left Cleanser: Soap and Water 1 x Per Day/10 Days Discharge Instructions: May shower and wash wound with dial antibacterial soap and water prior to dressing change. Prim Dressing: Maxorb Extra Calcium Alginate, 2x2 (in/in) (Generic) 1 x Per Day/10 Days ary Discharge Instructions: Apply to wound bed as instructed Secondary Dressing: Optifoam Non-Adhesive Dressing, 4x4 in 1 x Per Day/10 Days Discharge Instructions: Apply over primary dressing cut to make foam donut Secondary Dressing: Woven Gauze Sponge, Non-Sterile 4x4 in 1 x Per Day/10 Days Discharge Instructions: Apply over primary dressing as directed. Secured With: 59M Medipore H Soft Cloth Surgical T ape, 4 x 10 (in/yd) 1 x Per Day/10 Days Discharge Instructions: Secure with tape as directed. Electronic Signature(s) Signed: 06/06/2022 1:43:33 PM By: Duanne Guess MD FACS Entered By: Duanne Guess on 06/06/2022 13:43:32 -------------------------------------------------------------------------------- Problem List Details Patient Name: Date of Service: Samuel Most MA R 06/06/2022 12:30 PM Medical Record Number: [id number] Patient  Account Number: [id number] Date of Birth/Sex: Treating RN: July 01, 1965 (57 y.o. 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 02/02/2022 No Yes without evidence of necrosis L97.522 Non-pressure chronic ulcer of other part of left foot with fat layer exposed 02/02/2022 No Yes E11.621 Type 2 diabetes mellitus with foot ulcer 02/02/2022 No Yes Z89.439 Acquired absence of unspecified foot 02/02/2022 No Yes Inactive Problems Resolved Problems Electronic Signature(s) Signed: 06/06/2022 1:39:22 PM By: Duanne Guess MD FACS Entered By: Duanne Guess on 06/06/2022 13:39:22 -------------------------------------------------------------------------------- Progress Note Details Patient Name: Date of Service: Samuel Most MA R 06/06/2022 12:30 PM Medical Record Number: [id number] Patient Account Number: [id number] Date of Birth/Sex: Treating RN: 1966-01-25 (57 y.o. 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 The visit today was conducted with the assistance of the language line interpreter; the patient declines the use of an in person  interpreter. He is a poorly controlled diabetic (last hemoglobin A1c 10.8, but it has been as high as 15 in the past) with CKD stage III and hypertension. 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 He required revision of the right TMA in October. This was done in the [facility]. It is not entirely clear how he was referred to our clinic however he has openings on the distal portion of the right foot as well as drainage coming from the plantar aspect of his left foot underneath some callus. The patient has just been applying dry gauze to both areas. 02/09/2022: The left-sided wound is quite a bit smaller today, but is covered with callus. The right sided wound has also contracted a little bit and is very clean, but there is also callus accumulation around the perimeter. Unfortunately, due to his Medicaid status, we have been unable to secure home health assistance and  he has been changing the dressings on his own. He also has difficulty obtaining dressing supplies. 02/19/2022: Both wounds are smaller, particularly on the right. The left is covered with a layer of callus. No concern for infection. 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 There is light slough on the surface with some surrounding callus. The left plantar foot wound has callused over. Underneath the callus, the there is a small opening in the skin. 03/06/2022: The wounds are about the same size this week. There is callus accumulation around each, but the surfaces are cleaner. 03/19/2022: The wound on the right is quite a bit smaller this week. There is minimal callus accumulation and light slough on the surface. The wound on the left is almost completely covered in callus. 03/26/2022: The wound on the right continues to contract and is narrower again this week. The wound on the left has become covered in  callus once again. [place], [name] ([id number]) [id number] Page 6 of 9 04/24/2022: In the month since he was last seen, apparently the patient developed some purulent drainage from his foot and was seen by podiatry at [facility]. He was debrided and given antibiotics. Subsequently, his wounds actually look extremely good today. There is callus covering the plantar ulcer on the left. The right TMA site is quite a bit narrower and clean with just some callus and slough accumulation. 05/01/2022: The wound on the plantar surface of his left foot is closed under a thick layer of callus. On the right, the wound is substantially smaller with just a little bit of slough but also heavy buildup of callus around the wound opening. 05/08/2022: The left plantar foot ulcer remains closed. On the right, the wound continues to narrow significantly. There is still some depth to the wound, but it is clean. 05/22/2022: His wounds are closed. 05/29/2022: The wound on his right foot has reopened. This appears to be secondary to the callus cracking and causing a split in the underlying tissue. In addition, his daughter reports that he has been complaining of pain in the left foot. There is a fluctuant area at the end of the foot and it appears there is some darker discoloration under the callus that covers the site where the wound was. 06/06/2022: The wound on his right foot has healed down to a narrow slit underneath a truly impressive amount of callus. The left foot has also tried to cover the wounded area with callus, but this is more open. There is slough buildup and some undermining secondary to the callus accumulation. The culture that I took last week only returned with very low levels of a variety of bacteria species and no antibiotics were prescribed. His foot is much less red and warm today. Patient History Family History Unknown History, Cancer, No family history of Diabetes, Heart Disease,  Hereditary Spherocytosis, Hypertension, Kidney Disease, Lung Disease, Seizures, Stroke, Thyroid Problems, Tuberculosis. Social History Never smoker, Marital Status - Married, Alcohol Use - Never, Drug Use - No History, Caffeine Use - Rarely. Medical History Hematologic/Lymphatic Patient has history of Lymphedema Cardiovascular Patient has history of Hypertension Endocrine Patient has history of Type II Diabetes Neurologic Patient has history of Neuropathy Medical A Surgical History Notes nd Genitourinary AKI Objective Constitutional no acute distress. Vitals Time Taken: 12:43 PM, Height: 72 in, Weight: 214 lbs, BMI: 29, Temperature: 99.2 F, Pulse: 99 bpm, Respiratory Rate: 20 breaths/min, Blood Pressure: 133/85 mmHg, Capillary Blood Glucose: 145 mg/dl. General Notes: glucose per pt report this  am Respiratory Normal work of breathing on room air. General Notes: 06/06/2022: The wound on his right foot has healed down to a narrow slit underneath a truly impressive amount of callus. The left foot has also tried to cover the wounded area with callus, but this is more open. There is slough buildup and some undermining secondary to the callus accumulation. Integumentary (Hair, Skin) Wound #3 status is Open. Original cause of wound was Gradually Appeared. The date acquired was: 05/29/2022. The wound has been in treatment 1 weeks. The wound is located on the Right Foot. The wound measures 1.2cm length x 0.2cm width x 0.3cm depth; 0.188cm^2 area and 0.057cm^3 volume. There is Fat Layer (Subcutaneous Tissue) exposed. There is no tunneling or undermining noted. There is a medium amount of serosanguineous drainage noted. The wound margin is thickened. There is large (67-100%) red granulation within the wound bed. There is a small (1-33%) amount of necrotic tissue within the wound bed including Eschar. The periwound skin appearance had no abnormalities noted for color. The periwound skin appearance  exhibited: Callus, Dry/Scaly. Wound #4 status is Open. Original cause of wound was Hematoma. The date acquired was: 05/29/2022. The wound has been in treatment 1 weeks. The wound is located on the Left Amputation Site - Transmetatarsal. The wound measures 0.7cm length x 0.4cm width x 0.7cm depth; 0.22cm^2 area and 0.154cm^3 volume. There is Fat Layer (Subcutaneous Tissue) exposed. There is no tunneling noted, however, there is undermining starting at 12:00 and ending at 12:00 with a maximum distance of 0.8cm. There is a medium amount of sanguinous drainage noted. The wound margin is well defined and not attached to the wound base. There is large (67-100%) red granulation within the wound bed. There is a small (1-33%) amount of necrotic tissue within the wound bed including Adherent Slough. The periwound skin appearance had no abnormalities noted for color. The periwound skin appearance exhibited: Callus, Dry/Scaly. Periwound temperature was noted as No Abnormality. The periwound has tenderness on palpation. 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 There was a Excisional Skin/Subcutaneous Tissue Debridement with a total area of 0.38 sq cm performed by Duanne Guess, MD. With the following instrument(s): Curette to remove Viable and Non-Viable tissue/material. Material removed includes Callus, Subcutaneous Tissue, and Skin: Epidermis after achieving pain control using Lidocaine 4%  Topical Solution. No specimens were taken. A time out was conducted at 13:00, prior to the start of the procedure. A Minimum amount of bleeding was controlled with Pressure. The procedure was tolerated well with a pain level of 0 throughout and a pain level of 0 following the procedure. Post Debridement Measurements: 1.2cm length x 0.2cm width x 0.2cm depth; 0.038cm^3 volume. Character of Wound/Ulcer Post Debridement is improved. Severity of Tissue Post Debridement is: Fat layer exposed. Post procedure Diagnosis Wound #3: Same as Pre-Procedure General Notes: Scribed for Dr Lady Gary by Zenaida Deed, RN. Wound #4 Pre-procedure diagnosis of Wound #4 is a Diabetic Wound/Ulcer of the Lower Extremity located on the Left Amputation Site - Transmetatarsal .Severity of Tissue Pre Debridement is:  Fat layer exposed. There was a Excisional Skin/Subcutaneous Tissue Debridement with a total area of 2.04 sq cm performed by Duanne Guess, MD. With the following instrument(s): Curette to remove Viable and Non-Viable tissue/material. Material removed includes Callus, Subcutaneous Tissue, and Skin: Epidermis after achieving pain control using Lidocaine 4% T opical Solution. No specimens were taken. A time out was conducted at 13:00, prior to the start of the procedure. A Minimum amount of bleeding was controlled with Pressure. The procedure was tolerated well with a pain level of 0 throughout and a pain level of 0 following the procedure. Post Debridement Measurements: 1.3cm length x 1cm width x 0.4cm depth; 0.408cm^3 volume. Character of Wound/Ulcer Post Debridement is improved. Severity of Tissue Post Debridement is: Fat layer exposed. Post procedure Diagnosis Wound #4: Same as Pre-Procedure General Notes: Scribed for Dr Lady Gary by Zenaida Deed, RN. Plan Follow-up Appointments: Return Appointment in 1 week. - Dr. 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 Non Wound Condition: Apply the following to affected area as directed: - urea cream 40% to callouses daily (do not put on open areas) WOUND #3: - Foot Wound Laterality: Right Cleanser: Soap and Water 1 x Per Day/10 Days Discharge Instructions: May shower and wash wound with dial antibacterial soap and water prior to dressing change. Prim Dressing: Maxorb Extra Calcium Alginate, 2x2 (in/in) (Generic) 1 x Per Day/10 Days ary Discharge Instructions: Apply to wound bed as instructed Secondary Dressing: Optifoam Non-Adhesive Dressing, 4x4 in 1 x Per Day/10 Days Discharge Instructions: Apply over primary dressing cut to make foam donut Secondary Dressing: Woven Gauze Sponge, Non-Sterile 4x4 in 1 x Per Day/10 Days Discharge Instructions: Apply over primary dressing as directed. Secured With: 42M Medipore H Soft Cloth Surgical T ape, 4 x 10 (in/yd) 1 x Per Day/10 Days Discharge Instructions: Secure with tape as directed. WOUND #4: - Amputation Site - Transmetatarsal Wound Laterality: Left Cleanser: Soap and Water 1 x Per Day/10 Days Discharge Instructions: May shower and wash wound with dial antibacterial soap and water prior to dressing change. Prim Dressing: Maxorb Extra Calcium Alginate, 2x2 (in/in) (Generic) 1 x Per Day/10 Days ary Discharge Instructions: Apply to wound bed as instructed Secondary Dressing: Optifoam Non-Adhesive Dressing, 4x4 in 1 x Per Day/10 Days Discharge Instructions: Apply over primary dressing cut to make foam donut Secondary Dressing: Woven Gauze Sponge, Non-Sterile 4x4 in 1 x Per Day/10 Days Discharge Instructions: Apply over primary dressing as directed. Secured With: 42M Medipore H Soft Cloth Surgical T ape, 4 x 10 (in/yd) 1 x Per Day/10 Days Discharge Instructions: Secure with tape as directed. 06/06/2022: The wound on his right foot has healed down to a narrow slit underneath a truly  impressive amount of callus. The left foot has also tried to cover the wounded area with callus, but this is more open. There is slough buildup and some undermining secondary to the callus accumulation. I used a curette to debride callus, slough, and subcutaneous tissue from his wounds. I spent a lot of time trying to clear the callus from the end of his feet to avoid further cracking and expansion of his wounds. We will continue to use silver alginate. We emphasized to him the need to pack the dressing material down into the depths of the wounds to contact the surface, rather than just laying the dressing over the top of the wound. Follow up in  one week. Electronic Signature(s) [place], [place] ([id number]) [id number] Page 8 of 9 Signed: 06/06/2022 1:46:10 PM By: Duanne Guess MD FACS Entered By: Duanne Guess on 06/06/2022 13:46:10 -------------------------------------------------------------------------------- HxROS Details Patient Name: Date of Service: Samuel Most MA R 06/06/2022 12:30 PM Medical Record Number: [id number] Patient Account Number: [id number] Date of Birth/Sex: Treating RN: 1965/08/20 (57 y.o. M) Primary Care Provider: Marrianne Mood Other Clinician: Referring Provider: Treating Provider/Extender: Alena Bills in Treatment: 17 Hematologic/Lymphatic Medical History: Positive for: Lymphedema Cardiovascular Medical History: Positive for: Hypertension Endocrine Medical History: Positive for: Type II Diabetes Treated with: Insulin, Oral agents Genitourinary Medical History: Past Medical History Notes: AKI Neurologic Medical History: Positive for: Neuropathy Immunizations Pneumococcal Vaccine: Received Pneumococcal Vaccination: Yes Received Pneumococcal Vaccination On or After 60th Birthday: Yes Implantable Devices No devices added Family and Social History Unknown History: Yes; Cancer: Yes; Diabetes: No;  Heart Disease: No; Hereditary Spherocytosis: No; Hypertension: No; Kidney Disease: No; Lung Disease: No; Seizures: No; Stroke: No; Thyroid Problems: No; Tuberculosis: No; Never smoker; Marital Status - Married; Alcohol Use: Never; Drug Use: No History; Caffeine Use: Rarely; Financial Concerns: No; Food, Clothing or Shelter Needs: No; Support System Lacking: No; Transportation Concerns: No Electronic Signature(s) Signed: 06/06/2022 4:23:55 PM By: Duanne Guess MD FACS Entered By: Duanne Guess on 06/06/2022 13:41:36 -------------------------------------------------------------------------------- SuperBill Details Patient Name: Date of Service: Samuel Most MA R 06/06/2022 Medical Record Number: [id number] Patient Account Number: [id number] Date of Birth/Sex: Treating RN: [birth year] (57 y.o. M) Primary Care Provider: Marrianne Mood Other Clinician: Referring Provider: Treating Provider/Extender: Alena Bills in Treatment: [address] 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 Modifier: without evidence of n Quantity: 1 ecrosis Physician Procedures : CPT4 Code Description Modifier [id number] 99214 - WC PHYS LEVEL 4 - EST PT 25 ICD-10 Diagnosis Description L97.515 Non-pressure chronic ulcer of other part of right foot  with muscle involvement without evidence of n L97.522 Non-pressure chronic ulcer of  other part of left foot with fat layer exposed E11.621 Type 2 diabetes mellitus with foot ulcer Z89.439 Acquired absence of unspecified foot Quantity: 1 ecrosis : [id number] 11042 - WC PHYS SUBQ TISS 20 SQ CM ICD-10 Diagnosis Description 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

## 2022-06-07 ENCOUNTER — Other Ambulatory Visit (HOSPITAL_COMMUNITY): Payer: Self-pay

## 2022-06-08 ENCOUNTER — Other Ambulatory Visit (HOSPITAL_COMMUNITY): Payer: Self-pay

## 2022-06-11 ENCOUNTER — Ambulatory Visit (HOSPITAL_BASED_OUTPATIENT_CLINIC_OR_DEPARTMENT_OTHER): Payer: Medicaid Other | Admitting: General Surgery

## 2022-06-14 ENCOUNTER — Encounter (HOSPITAL_BASED_OUTPATIENT_CLINIC_OR_DEPARTMENT_OTHER): Payer: Medicaid Other | Admitting: General Surgery

## 2022-06-14 DIAGNOSIS — E11621 Type 2 diabetes mellitus with foot ulcer: Secondary | ICD-10-CM | POA: Diagnosis not present

## 2022-06-15 ENCOUNTER — Other Ambulatory Visit (HOSPITAL_COMMUNITY): Payer: Self-pay

## 2022-06-20 ENCOUNTER — Other Ambulatory Visit (HOSPITAL_COMMUNITY): Payer: Self-pay

## 2022-06-21 ENCOUNTER — Other Ambulatory Visit (HOSPITAL_COMMUNITY): Payer: Self-pay

## 2022-06-21 ENCOUNTER — Ambulatory Visit (INDEPENDENT_AMBULATORY_CARE_PROVIDER_SITE_OTHER): Payer: Medicaid Other | Admitting: Student

## 2022-06-21 ENCOUNTER — Telehealth: Payer: Self-pay | Admitting: Dietician

## 2022-06-21 ENCOUNTER — Encounter: Payer: Self-pay | Admitting: Student

## 2022-06-21 VITALS — BP 109/75 | HR 82 | Temp 98.2°F | Ht 74.0 in | Wt 226.0 lb

## 2022-06-21 DIAGNOSIS — I10 Essential (primary) hypertension: Secondary | ICD-10-CM

## 2022-06-21 DIAGNOSIS — D638 Anemia in other chronic diseases classified elsewhere: Secondary | ICD-10-CM | POA: Diagnosis not present

## 2022-06-21 DIAGNOSIS — N1831 Chronic kidney disease, stage 3a: Secondary | ICD-10-CM

## 2022-06-21 DIAGNOSIS — I129 Hypertensive chronic kidney disease with stage 1 through stage 4 chronic kidney disease, or unspecified chronic kidney disease: Secondary | ICD-10-CM

## 2022-06-21 DIAGNOSIS — M8618 Other acute osteomyelitis, other site: Secondary | ICD-10-CM | POA: Diagnosis not present

## 2022-06-21 DIAGNOSIS — E1169 Type 2 diabetes mellitus with other specified complication: Secondary | ICD-10-CM

## 2022-06-21 DIAGNOSIS — E1122 Type 2 diabetes mellitus with diabetic chronic kidney disease: Secondary | ICD-10-CM

## 2022-06-21 DIAGNOSIS — D631 Anemia in chronic kidney disease: Secondary | ICD-10-CM

## 2022-06-21 DIAGNOSIS — Z794 Long term (current) use of insulin: Secondary | ICD-10-CM

## 2022-06-21 DIAGNOSIS — E1165 Type 2 diabetes mellitus with hyperglycemia: Secondary | ICD-10-CM | POA: Diagnosis present

## 2022-06-21 DIAGNOSIS — M869 Osteomyelitis, unspecified: Secondary | ICD-10-CM

## 2022-06-21 LAB — POCT GLYCOSYLATED HEMOGLOBIN (HGB A1C): Hemoglobin A1C: 9.5 % — AB (ref 4.0–5.6)

## 2022-06-21 LAB — GLUCOSE, CAPILLARY: Glucose-Capillary: 273 mg/dL — ABNORMAL HIGH (ref 70–99)

## 2022-06-21 MED ORDER — DOXYCYCLINE HYCLATE 100 MG PO TABS
100.0000 mg | ORAL_TABLET | Freq: Two times a day (BID) | ORAL | 0 refills | Status: DC
  Filled 2022-06-21 – 2022-07-02 (×2): qty 90, 45d supply, fill #0
  Filled 2022-07-02: qty 60, 30d supply, fill #0

## 2022-06-21 NOTE — Telephone Encounter (Signed)
Per pharmacy, PA needed for [employer] 3. Humalog too soon to fill .Provider line [phone]  He was in a facility, Medicaid wiped out everything, so PAs need to be redone.  [phone]

## 2022-06-21 NOTE — Telephone Encounter (Signed)
Wife of patient explains that they could not get more sensor  from pharmacy and that is why they stopped using the Freestyle Libre 3 Continuous glucose monitor. Call in to pharmacy, wafting to hear back.

## 2022-06-21 NOTE — Patient Instructions (Signed)
Please increase your mealtime insulin to 8 units with breakfast lunch and dinner. If you do not eat a regular sized meal please just give yourself 5 units. Continue your other diabetes regimen.    We will see you in 2 weeks.

## 2022-06-22 ENCOUNTER — Other Ambulatory Visit (HOSPITAL_COMMUNITY): Payer: Self-pay

## 2022-06-22 LAB — BMP8+ANION GAP
Anion Gap: 14 mmol/L (ref 10.0–18.0)
BUN/Creatinine Ratio: 21 — ABNORMAL HIGH (ref 9–20)
BUN: 29 mg/dL — ABNORMAL HIGH (ref 6–24)
CO2: 18 mmol/L — ABNORMAL LOW (ref 20–29)
Calcium: 9.1 mg/dL (ref 8.7–10.2)
Chloride: 106 mmol/L (ref 96–106)
Creatinine, Ser: 1.38 mg/dL — ABNORMAL HIGH (ref 0.76–1.27)
Glucose: 285 mg/dL — ABNORMAL HIGH (ref 70–99)
Potassium: 4.6 mmol/L (ref 3.5–5.2)
Sodium: 138 mmol/L (ref 134–144)
eGFR: 60 mL/min/{1.73_m2} (ref >59)

## 2022-06-22 LAB — CBC
Hematocrit: 38.1 % (ref 37.5–51.0)
Hemoglobin: 12.7 g/dL — ABNORMAL LOW (ref 13.0–17.7)
MCH: 25.9 pg — ABNORMAL LOW (ref 26.6–33.0)
MCHC: 33.3 g/dL (ref 31.5–35.7)
MCV: 78 fL — ABNORMAL LOW (ref 79–97)
Platelets: 220 10*3/uL (ref 150–450)
RBC: 4.9 x10E6/uL (ref 4.14–5.80)
RDW: 14.1 % (ref 11.6–15.4)
WBC: 8.6 10*3/uL (ref 3.4–10.8)

## 2022-06-24 DIAGNOSIS — D638 Anemia in other chronic diseases classified elsewhere: Secondary | ICD-10-CM | POA: Insufficient documentation

## 2022-06-24 NOTE — Assessment & Plan Note (Signed)
Vitals:   06/21/22 1448  BP: 109/75  Pulse: 82  Temp: 98.2 F (36.8 C)  SpO2: 100%   Well controlled. On olmesartan and amlodipine.

## 2022-06-24 NOTE — Progress Notes (Signed)
   CC: f/u of T2DM  HPI:  [name] is a 57 y.o. M with PMH per below who presents for follow up of his T2Dm. Please see problem based charting under encounters tab for further details.    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    Physical Exam:  Vitals:   06/21/22 1448  BP: 109/75  Pulse: 82  Temp: 98.2 F (36.8 C)  TempSrc: Oral  SpO2: 100%  Weight: 226 lb (102.5 kg)  Height: 6\' 2"  (1.88 m)    Constitutional: Well-developed, well-nourished, and in no distress.  HENT:  Head: Normocephalic and atraumatic.  Eyes: EOM are normal.  Neck: Normal range of motion.  Cardiovascular: Normal rate, regular rhythm, intact distal pulses. No gallop and no friction rub.  No murmur heard. No lower extremity edema  Pulmonary: Non labored breathing on room air, no wheezing or rales  Abdominal: Soft. Normal bowel sounds. Non distended and non tender Musculoskeletal: Normal range of motion. S/p bilateral TMA  Neurological: Alert and oriented to person, place, and time. Non focal  Skin: Skin is warm and dry.    Media Information      Media Information       Assessment & Plan:   See Encounters Tab for problem based charting.  Patient discussed with Dr. Mayford Knife

## 2022-06-24 NOTE — Assessment & Plan Note (Signed)
Remains elevated. A1c 9.5. Will increase his mealtime insulin to 8u TID.  Continue 74u of tresiba. Continue metformin 1g bid, trulicity, Will see patient in 2 weeks to discuss further titration of his insulin. Recommend increasing jardiance at that time as well.

## 2022-06-24 NOTE — Assessment & Plan Note (Signed)
    Latest Ref Rng & Units 06/21/2022    4:03 PM 02/01/2022   10:13 AM 01/26/2022    6:48 PM  CBC  WBC 3.4 - 10.8 x10E3/uL 8.6  9.3  7.9   Hemoglobin 13.0 - 17.7 g/dL 16.1  09.6  04.5   Hematocrit 37.5 - 51.0 % 38.1  35.5  34.9   Platelets 150 - 450 x10E3/uL 220  338  254      Mild anemia. Likely AOCKD and inflammation.

## 2022-06-26 ENCOUNTER — Telehealth: Payer: Self-pay

## 2022-06-26 NOTE — Telephone Encounter (Signed)
A Prior Authorization was initiated for this patients TRESIBA through [employer].   Confirmation #  [id number] (Submitted with Dr. Vinie Sill NPI)

## 2022-06-27 ENCOUNTER — Encounter (HOSPITAL_BASED_OUTPATIENT_CLINIC_OR_DEPARTMENT_OTHER): Payer: Medicaid Other | Admitting: General Surgery

## 2022-06-28 NOTE — Telephone Encounter (Signed)
Prior Auth for patients medication TRESIBA approved by MEDICA[employer] from 06/26/22 to 06/26/23.

## 2022-07-02 ENCOUNTER — Other Ambulatory Visit (HOSPITAL_COMMUNITY): Payer: Self-pay

## 2022-07-02 MED ORDER — MUPIROCIN 2 % EX OINT
TOPICAL_OINTMENT | Freq: Every day | CUTANEOUS | 0 refills | Status: AC
  Filled 2022-07-02: qty 22, 7d supply, fill #0

## 2022-07-03 ENCOUNTER — Other Ambulatory Visit (HOSPITAL_COMMUNITY): Payer: Self-pay

## 2022-07-05 ENCOUNTER — Encounter: Payer: Medicaid Other | Admitting: Internal Medicine

## 2022-07-09 NOTE — Progress Notes (Signed)
Internal Medicine Clinic Attending  Case discussed with Dr. Carter  At the time of the visit.  We reviewed the resident's history and exam and pertinent patient test results.  I agree with the assessment, diagnosis, and plan of care documented in the resident's note.  

## 2022-07-12 ENCOUNTER — Other Ambulatory Visit (HOSPITAL_BASED_OUTPATIENT_CLINIC_OR_DEPARTMENT_OTHER): Payer: Self-pay | Admitting: General Surgery

## 2022-07-13 ENCOUNTER — Other Ambulatory Visit: Payer: Self-pay

## 2022-07-13 ENCOUNTER — Ambulatory Visit (INDEPENDENT_AMBULATORY_CARE_PROVIDER_SITE_OTHER): Payer: Medicaid Other

## 2022-07-13 ENCOUNTER — Other Ambulatory Visit (HOSPITAL_COMMUNITY): Payer: Self-pay

## 2022-07-13 ENCOUNTER — Other Ambulatory Visit: Payer: Self-pay | Admitting: *Deleted

## 2022-07-13 VITALS — BP 149/75 | HR 90 | Temp 98.0°F | Ht 72.0 in | Wt 222.3 lb

## 2022-07-13 DIAGNOSIS — E1165 Type 2 diabetes mellitus with hyperglycemia: Secondary | ICD-10-CM | POA: Diagnosis not present

## 2022-07-13 DIAGNOSIS — I10 Essential (primary) hypertension: Secondary | ICD-10-CM

## 2022-07-13 DIAGNOSIS — Z7984 Long term (current) use of oral hypoglycemic drugs: Secondary | ICD-10-CM

## 2022-07-13 DIAGNOSIS — Z89439 Acquired absence of unspecified foot: Secondary | ICD-10-CM | POA: Diagnosis not present

## 2022-07-13 DIAGNOSIS — Z794 Long term (current) use of insulin: Secondary | ICD-10-CM

## 2022-07-13 DIAGNOSIS — M869 Osteomyelitis, unspecified: Secondary | ICD-10-CM

## 2022-07-13 MED ORDER — OLMESARTAN MEDOXOMIL 20 MG PO TABS
20.0000 mg | ORAL_TABLET | Freq: Every day | ORAL | 3 refills | Status: AC
  Filled 2022-07-13: qty 30, 30d supply, fill #0
  Filled 2022-09-18: qty 30, 30d supply, fill #1

## 2022-07-13 MED ORDER — DOXYCYCLINE HYCLATE 100 MG PO TABS
100.0000 mg | ORAL_TABLET | Freq: Two times a day (BID) | ORAL | 0 refills | Status: AC
  Filled 2022-07-13: qty 60, 30d supply, fill #0
  Filled 2022-09-18: qty 30, 15d supply, fill #1

## 2022-07-13 MED ORDER — INSULIN LISPRO 100 UNIT/ML IJ SOLN
8.0000 [IU] | Freq: Three times a day (TID) | INTRAMUSCULAR | 3 refills | Status: AC
  Filled 2022-07-13: qty 10, 42d supply, fill #0
  Filled 2022-09-19: qty 10, 28d supply, fill #0
  Filled 2023-01-03: qty 10, 28d supply, fill #1

## 2022-07-13 NOTE — Progress Notes (Signed)
CC: T2DM/foot wound f/u  HPI:  [name] is a 57 y.o. male with past medical history of HTN, HLD, T2DM, CKD3, anemia of chronic disease that presents for f/u T2DM/foot wound.    Allergies as of 07/13/2022       Reactions   Novolog [insulin Aspart (human Analog)] Rash   Spoke to patient and wife, they do not recall a rash with Novolog. They state possibly a minor rash occurred, but patient was able to tolerate.   Penicillins    Pt reports history of reaction but has tolerated PCN in recent years.        Medication List        Accurate as of July 13, 2022  2:17 PM. If you have any questions, ask your nurse or doctor.          Accu-Chek Guide test strip Generic drug: glucose blood Check blood sugar 4 (four) times daily.   Accu-Chek Guide w/Device Kit Check blood sugar level in the morning before first meal or drink of the day, and before each meal.   Accu-Chek Softclix Lancets lancets Use up to 4 (four) times daily.   amLODipine 5 MG tablet Commonly known as: NORVASC Take 1 tablet (5 mg total) by mouth daily.   atorvastatin 40 MG tablet Commonly known as: LIPITOR Take 1 tablet (40 mg total) by mouth daily.   doxycycline 100 MG tablet Commonly known as: VIBRA-TABS Take 1 tablet (100 mg total) by mouth 2 (two) times daily.   FreeStyle Libre 3 Sensor Misc Place 1 sensor on the skin every 14 days. Use to check glucose continuously   insulin lispro 100 UNIT/ML injection Commonly known as: HUMALOG Inject 0.08 mLs (8 Units total) into the skin 3 (three) times daily with meals. What changed: how much to take Changed by: Samuel Caldwell, MD   Jardiance 10 MG Tabs tablet Generic drug: empagliflozin Take 1 tablet (10 mg total) by mouth daily before breakfast.   Medical Compression Stockings Misc 1 each by Does not apply route daily.   metFORMIN 1000 MG tablet Commonly known as: GLUCOPHAGE Take 1 tablet (1,000 mg total) by mouth 2 (two) times daily with a  meal.   mupirocin ointment 2 % Commonly known as: BACTROBAN Apply topically daily to wound on foot   mupirocin ointment 2 % Commonly known as: BACTROBAN Apply to wounds on feet daily.   olmesartan 20 MG tablet Commonly known as: BENICAR Take 1 tablet (20 mg total) by mouth daily.   Pro-Stat AWC Liqd Take 1 packet by mouth 3 (three) times daily after meals.   Evaristo Bury FlexTouch 100 UNIT/ML FlexTouch Pen Generic drug: insulin degludec Inject 74 Units into the skin at bedtime.   Trulicity 3 MG/0.5ML Sopn Generic drug: Dulaglutide Inject 3 mg as directed once a week.   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   Physical Exam: Vitals:   07/13/22 1027 07/13/22 1035  BP: (!) 145/80 (!) 149/75  Pulse: 90 90  Temp: 98 F (36.7 C)   TempSrc: Oral  SpO2: 100%   Weight: 222 lb 4.8 oz (100.8 kg)   Height: 6' (1.829 m)    Constitutional: Well-developed, well-nourished, appears comfortable  HENT: Normocephalic and atraumatic.  Eyes: EOMI.  Neck: Normal range of motion.  Cardiovascular: Regular rate, regular rhythm. No murmurs, rubs, or gallops. Normal radial and PT pulses bilaterally. No LE edema.  Pulmonary: Normal respiratory effort. No wheezes, rales, rhonchi, or crackles.   Abdominal: Soft. Non-distended. No tenderness. Normal bowel sounds.  Musculoskeletal: Bilateral feet s/p transmetatarsal amputation. Dry crusted wound on bilateral plantar surfaces with old dried blood. No active bleeding or drainage. No erythema, warmth, or swelling of his wounds.  Neurological: Alert and oriented to person, place, and time.  Non-focal. Skin: warm and dry.    Assessment & Plan:   See Encounters Tab for problem based charting.  Type 2 diabetes mellitus with hyperglycemia (HCC) Current medications include metFORMIN 1000 MG BID, Jardiance 10 MG daily, insulin lispro 5 units TID w/ meals, insulin degludec 74 units daily. Patient states that they are compliant with these medications. Patient reports having symptomatic lows at night and in there morning. Glucose is in the 200s today in clinic. Patient denies polyuria, polydipsia, fatigue. A1c was 9.5% 3 weeks ago. Plan to decrease his long-acting insulin and increase his mealtime insulin for improved glycemic control.   Plan: - Continue metFORMIN 1000 MG BID and Jardiance 10 MG daily - Decrease insulin degludec to 70 units daily - Increase insulin lispro to 8 units TID w/ meals - Repeat A1c in 2 months  - F/u ophthalmology   History of transmetatarsal amputation of foot (HCC) Patient reports that he noticed bleeding in both shoes over the last 1 week. This has been a small amount of bleeding but has occurred daily. No active bleeding or signs of infection on exam today. Patient is afebrile and well-appearing. Patient follows with podiatry and wound care. He last saw podiatry on 6/3 with plan for mupirocin for his wounds. His next podiatry visit is scheduled for 7/15. He last saw wound care on 5/8 and has an appointment scheduled in 3 days on 6/17.   Plan: - Continue f/u w/ wound care and podiatry  Essential hypertension Current medications include olmesartan 20 MG daily and amLODipine 5 MG daily. Patient states that they are compliant with these medications but did not take them this morning. Patient denies HA, lightheadedness, dizziness, CP, or SOB. Initial BP today is 145/80. Repeat BP is 149/75.    Plan: - Continue olmesartan 20 MG daily and amLODipine 5 MG daily - Repeat BP at next visit    Patient seen with Dr. Mikey Bussing

## 2022-07-13 NOTE — Assessment & Plan Note (Signed)
Current medications include olmesartan 20 MG daily and amLODipine 5 MG daily. Patient states that they are compliant with these medications but did not take them this morning. Patient denies HA, lightheadedness, dizziness, CP, or SOB. Initial BP today is 145/80. Repeat BP is 149/75.    Plan: - Continue olmesartan 20 MG daily and amLODipine 5 MG daily - Repeat BP at next visit

## 2022-07-13 NOTE — Assessment & Plan Note (Signed)
Patient reports that he noticed bleeding in both shoes over the last 1 week. This has been a small amount of bleeding but has occurred daily. No active bleeding or signs of infection on exam today. Patient is afebrile and well-appearing. Patient follows with podiatry and wound care. He last saw podiatry on 6/3 with plan for mupirocin for his wounds. His next podiatry visit is scheduled for 7/15. He last saw wound care on 5/8 and has an appointment scheduled in 3 days on 6/17.   Plan: - Continue f/u w/ wound care and podiatry

## 2022-07-13 NOTE — Patient Instructions (Signed)
Thank you for coming to see [place] in clinic Mr. [name].  Plan:  - Please change how you are taking:     - Increase insulin lispro to 8 units three times daily with meals  - Please follow up with your wound care doctor and your food doctor (podiatrist)  Return Precautions: If you develop fever, chills, increased bleeding, drainage from your wounds, swelling/warmth of your feet, please call our clinic and visit the emergency department as these can be signs of infection.    It was very nice to see you, thank you for allowing [place] to be involved in your care. We look forward to seeing you next time. Please call our clinic at [phone] if you have any questions or concerns. The best time to call is Monday-Friday from 9am-4pm, but there is someone available 24/7. If after hours or the weekend, call the main hospital number at [phone] and ask for the Internal Medicine Resident On-Call. If you need medication refills, please notify your pharmacy one week in advance and they will send [place] a request.   Please make sure to arrive 15 minutes prior to your next appointment. If you arrive late, you may be asked to reschedule.

## 2022-07-13 NOTE — Assessment & Plan Note (Signed)
Current medications include metFORMIN 1000 MG BID, Jardiance 10 MG daily, insulin lispro 5 units TID w/ meals, insulin degludec 74 units daily. Patient states that they are compliant with these medications. Patient reports having symptomatic lows at night and in there morning. Glucose is in the 200s today in clinic. Patient denies polyuria, polydipsia, fatigue. A1c was 9.5% 3 weeks ago. Plan to decrease his long-acting insulin and increase his mealtime insulin for improved glycemic control.   Plan: - Continue metFORMIN 1000 MG BID and Jardiance 10 MG daily - Decrease insulin degludec to 70 units daily - Increase insulin lispro to 8 units TID w/ meals - Repeat A1c in 2 months  - F/u ophthalmology

## 2022-07-13 NOTE — Telephone Encounter (Signed)
Patient stated when asked that his infection has come back in .his feet.   [name] been back for 1 week.  Last appointment 06/21/2022.  Checked with Dr. Antony Contras patient added to see Dr. Peterson Lombard for his foot infection.

## 2022-07-14 NOTE — Progress Notes (Signed)
Internal Medicine Clinic Attending  I saw and evaluated the patient.  I personally confirmed the key portions of the history and exam documented by the resident  and I reviewed pertinent patient test results.  The assessment, diagnosis, and plan were formulated together and I agree with the documentation in the resident's note.  

## 2022-07-16 ENCOUNTER — Other Ambulatory Visit (HOSPITAL_COMMUNITY): Payer: Self-pay

## 2022-07-16 ENCOUNTER — Encounter (HOSPITAL_BASED_OUTPATIENT_CLINIC_OR_DEPARTMENT_OTHER): Payer: Medicaid Other | Attending: General Surgery | Admitting: General Surgery

## 2022-07-16 DIAGNOSIS — I129 Hypertensive chronic kidney disease with stage 1 through stage 4 chronic kidney disease, or unspecified chronic kidney disease: Secondary | ICD-10-CM | POA: Insufficient documentation

## 2022-07-16 DIAGNOSIS — N183 Chronic kidney disease, stage 3 unspecified: Secondary | ICD-10-CM | POA: Diagnosis not present

## 2022-07-16 DIAGNOSIS — E1140 Type 2 diabetes mellitus with diabetic neuropathy, unspecified: Secondary | ICD-10-CM | POA: Diagnosis not present

## 2022-07-16 DIAGNOSIS — L97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 without evidence of necrosis: Secondary | ICD-10-CM | POA: Insufficient documentation

## 2022-07-16 DIAGNOSIS — E11621 Type 2 diabetes mellitus with foot ulcer: Secondary | ICD-10-CM | POA: Diagnosis present

## 2022-07-16 DIAGNOSIS — E1122 Type 2 diabetes mellitus with diabetic chronic kidney disease: Secondary | ICD-10-CM | POA: Insufficient documentation

## 2022-07-16 DIAGNOSIS — L97522 Non-pressure chronic ulcer of other part of left foot with fat layer exposed: Secondary | ICD-10-CM | POA: Insufficient documentation

## 2022-07-16 MED ORDER — LEVOFLOXACIN 750 MG PO TABS
750.0000 mg | ORAL_TABLET | Freq: Every day | ORAL | 0 refills | Status: AC
  Filled 2022-07-16: qty 10, 10d supply, fill #0

## 2022-07-16 NOTE — Progress Notes (Signed)
[place], [name] ([id number]) [id number] Page 1 of 10 Visit Report for 07/16/2022 Chief Complaint Document Details Patient Name: Date of Service: Samuel Most MA R 07/16/2022 8:30 A M Medical Record Number: [id number] Patient Account Number: [id number] Date of Birth/Sex: Treating RN: [birth year] (57 y.o. 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 [name] Primary Care Provider: Marrianne Mood Other Clinician: Referring Provider: Treating Provider/Extender: Alena Bills in Treatment: 23 Debridement Performed for Assessment: Wound #4 Left Amputation Site - Transmetatarsal Performed By: Physician Duanne Guess, MD Debridement Type: Debridement Severity of Tissue Pre Debridement: Fat layer exposed Level of Consciousness (Pre-procedure): Awake and Alert Pre-procedure Verification/Time Out Yes - 08:57 Taken: Start Time: 08:57 Pain Control: Lidocaine 4% Topical Solution Percent of Wound Bed Debrided: 100% T Area Debrided (cm): otal 0.16 Tissue and other material debrided: Non-Viable, Callus, Slough, Subcutaneous, Skin: Epidermis,  Slough Level: Skin/Subcutaneous Tissue Debridement Description: Excisional Instrument: Curette Bleeding: Minimum Hemostasis Achieved: Pressure Response to Treatment: Procedure was tolerated well Level of Consciousness (Post- Awake and Alert procedure): Post Debridement Measurements of Total Wound Length: (cm) 0.5 Width: (cm) 0.4 Depth: (cm) 0.4 Volume: (cm) 0.063 Character of Wound/Ulcer Post Debridement: Improved Severity of Tissue Post Debridement: Fat layer exposed [place], [name] ([id number]) [id number] Page 2 of 10 Post Procedure Diagnosis Same as Pre-procedure Notes scribed for Dr. Lady Gary by [name], RN Electronic Signature(s) Signed: 07/16/2022 11:16:21 AM By: Duanne Guess MD FACS Signed: 07/16/2022 3:30:01 PM By: [name] Entered By: [name] on 07/16/2022 09:01:34 -------------------------------------------------------------------------------- Debridement Details Patient Name: Date of Service: Samuel Most MA R 07/16/2022 8:30 A M Medical Record Number: [id number] Patient Account Number: [id number] Date of Birth/Sex: Treating RN: [birth year] (57 y.o. [name] Primary Care Provider: Marrianne Mood Other Clinician: Referring Provider: Treating Provider/Extender: Alena Bills in Treatment: 23 Debridement Performed for Assessment: Wound #3 Right Foot Performed By: Physician Duanne Guess, MD Debridement Type: Debridement Severity of Tissue Pre Debridement: Fat layer exposed Level of Consciousness (Pre-procedure): Awake and Alert Pre-procedure Verification/Time Out Yes - 08:57 Taken: Start Time: 08:57 Pain Control: Lidocaine 4% T opical Solution Percent of Wound Bed Debrided: 100% T Area Debrided (cm): otal 1.49 Tissue and other material debrided: Non-Viable, Callus, Slough, Subcutaneous, Skin: Epidermis, Slough Level: Skin/Subcutaneous Tissue Debridement  Description: Excisional Instrument: Curette Specimen: Tissue Culture Number of Specimens T aken: 1 Bleeding: Minimum Hemostasis Achieved: Pressure Response to Treatment: Procedure was tolerated well Level of Consciousness (Post- Awake and Alert procedure): Post Debridement Measurements of Total Wound Length: (cm) 1 Width: (cm) 1.9 Depth: (cm) 0.8 Volume: (cm) 1.194 Character of Wound/Ulcer Post Debridement: Improved Severity of Tissue Post Debridement: Fat layer exposed Post Procedure Diagnosis Same as Pre-procedure Notes scribed for Dr. Lady Gary by [name], RN Electronic Signature(s) Signed: 07/16/2022 1:21:48 PM By: Duanne Guess MD FACS Signed: 07/16/2022 3:30:01 PM By: [name] Previous Signature: 07/16/2022 11:16:21 AM Version By: Duanne Guess  MD FACS Entered By: [name] on 07/16/2022 12:49:56 [name] ([id number]) [id number] Page 3 of 10 -------------------------------------------------------------------------------- HPI Details Patient Name: Date of Service: Samuel Most MA R 07/16/2022 8:30 A M Medical Record Number: [id number] Patient Account Number: [id number] Date of Birth/Sex: Treating RN: 27-Jul-1965 (57 y.o. M) Primary Care Provider: Marrianne Mood Other Clinician: Referring Provider: Treating Provider/Extender: Alena Bills in Treatment: 23 History of Present Illness HPI Description: ADMISSION 02/02/2022 This is a 57 year old [place] man who speaks only Arabic. The visit today was conducted with the assistance of the language line interpreter; the patient declines the use of an in person interpreter. He is a poorly controlled diabetic (last hemoglobin A1c 10.8, but it has been as high as 15 in the past) with CKD stage III and hypertension. 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 He  required revision of the right TMA in October. This was done in the [facility] system. It is not entirely clear how he was referred to our clinic however he has openings on the distal portion of the right foot as well as drainage coming from the plantar aspect of his left foot underneath some callus. The patient has just been applying dry gauze to both areas. 02/09/2022: The left-sided wound is quite a bit smaller today, but is covered with callus. The right sided wound has also contracted a little bit and is very clean, but there is also callus accumulation around the perimeter. Unfortunately, due to his Medicaid status, we have been unable to secure home health assistance and he has been changing the dressings on his own. He also has difficulty obtaining dressing supplies. 02/19/2022: Both wounds are smaller, particularly on the right. The left is covered with a layer of callus. No concern for infection. 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 There is light slough on the surface with some surrounding callus. The left plantar foot wound has callused over. Underneath the callus, the there is a small opening in the skin. 03/06/2022: The wounds are about the same size this week. There is callus accumulation around each, but the surfaces are cleaner. 03/19/2022: The wound on the right is quite a bit smaller this week. There is minimal callus accumulation and light slough on the surface. The wound on the left is almost completely covered in callus. 03/26/2022: The wound on the right continues to contract and is narrower again this week. The wound on the left has become covered in callus once again. 04/24/2022: In the month since he was last seen, apparently the patient developed some purulent drainage from his foot and was seen by podiatry at [facility]. He was debrided and given antibiotics. Subsequently, his wounds actually look extremely good today. There is callus  covering the plantar ulcer on the left. The right TMA site is quite a bit narrower and clean with just some callus and slough accumulation. 05/01/2022: The wound on the plantar surface of his left foot is closed under a thick layer of callus. On the right, the wound is substantially smaller with just a little bit of slough but also heavy buildup of callus around the wound opening. 05/08/2022: The left plantar foot ulcer remains closed. On the right, the wound continues to narrow significantly. There is still some depth to the wound, but it is clean. 05/22/2022: His wounds are closed. 05/29/2022: The wound on his right foot has reopened. This appears to be secondary  to the callus cracking and causing a split in the underlying tissue. In addition, his daughter reports that he has been complaining of pain in the left foot. There is a fluctuant area at the end of the foot and it appears there is some darker discoloration under the callus that covers the site where the wound was. 06/06/2022: The wound on his right foot has healed down to a narrow slit underneath a truly impressive amount of callus. The left foot has also tried to cover the wounded area with callus, but this is more open. There is slough buildup and some undermining secondary to the callus accumulation. The culture that I took last week only returned with very low levels of a variety of bacteria species and no antibiotics were prescribed. His foot is much less red and warm today. 06/14/2022: Both wounds are quite a bit smaller today with the usual callus accumulation. 07/16/2022: He returns after a month-long absence. He apparently saw podiatry on June 3 and they performed a debridement, but between then and today, he has built up substantial callus. It appears that moisture has gotten under the callus and caused substantial tissue breakdown. There is a foul odor coming from his right foot. No frank pus encountered. Electronic Signature(s) Signed:  07/16/2022 9:35:06 AM By: Duanne Guess MD FACS Entered By: Duanne Guess on 07/16/2022 09:35:06 Physical Exam Details -------------------------------------------------------------------------------- [name] ([id number]) [id number] Page 4 of 10 Patient Name: Date of Service: Samuel Most MA R 07/16/2022 8:30 A M Medical Record Number: [id number] Patient Account Number: [id number] Date of Birth/Sex: Treating RN: 1965-05-02 (57 y.o. M) Primary Care Provider: Marrianne Mood Other Clinician: Referring Provider: Treating Provider/Extender: Alena Bills in Treatment: 23 Constitutional . . . . no acute distress. Respiratory Normal work of breathing on room air. Notes 07/16/2022: He has built up substantial callus. It appears that moisture has gotten under the callus and caused substantial tissue breakdown. There is a foul odor coming from his right foot. No frank pus encountered. Electronic Signature(s) Signed: 07/16/2022 9:35:53 AM By: Duanne Guess MD FACS Entered By: Duanne Guess on 07/16/2022 09:35:53 -------------------------------------------------------------------------------- Physician Orders Details Patient Name: Date of Service: Samuel Most MA R 07/16/2022 8:30 A M Medical Record Number: [id number] Patient Account Number: [id number] Date of Birth/Sex: Treating RN: 03-14-1965 (57 y.o. 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 - Dr. Lady Gary - room 1 Return A Anesthetic (In clinic) Topical Lidocaine 4% applied to wound bed Bathing/ Shower/ Hygiene May shower and wash wound with soap and water. Non Wound Condition pply the following to affected area as directed: - urea cream 40% to callouses daily (do not put on open areas) A Wound Treatment Wound #3 - Foot Wound Laterality: Right Cleanser: Soap and Water 1 x Per Day/10 Days Discharge Instructions: May shower and wash wound with dial antibacterial soap and water prior to dressing change. Prim Dressing: Maxorb Extra Ag+ Alginate Dressing, 2x2 (in/in) 1 x Per Day/10 Days ary Discharge Instructions: Apply to wound bed as instructed Secondary  Dressing: ALLEVYN Heel 4 1/2in x 5 1/2in / 10.5cm x 13.5cm 1 x Per Day/10 Days Discharge Instructions: Apply over primary dressing as directed. Secondary Dressing: Woven Gauze Sponge, Non-Sterile 4x4 in 1 x Per Day/10 Days Discharge Instructions: Apply over primary dressing as directed. [place], [name] ([id number]) [id number] Page 5 of 10 Secured With: 8M Medipore H Soft Cloth Surgical T ape, 4 x 10 (in/yd) 1 x Per Day/10 Days Discharge Instructions: Secure with tape as directed. Wound #4 - Amputation Site - Transmetatarsal Wound Laterality: Left Cleanser: Soap and Water 1 x Per Day/10 Days Discharge Instructions: May shower and wash wound with dial antibacterial soap and water prior to dressing change. Prim Dressing: Maxorb Extra Ag+ Alginate Dressing, 2x2 (in/in) 1 x Per Day/10 Days ary Discharge Instructions: Apply to wound bed as instructed Secondary Dressing: ALLEVYN Heel 4 1/2in x 5 1/2in / 10.5cm x 13.5cm 1 x Per Day/10 Days Discharge Instructions: Apply over primary dressing as directed. Secondary Dressing: Woven Gauze Sponge, Non-Sterile 4x4 in 1 x Per Day/10 Days Discharge Instructions: Apply over primary dressing as  directed. Secured With: 8M Medipore H Soft Cloth Surgical T ape, 4 x 10 (in/yd) 1 x Per Day/10 Days Discharge Instructions: Secure with tape as directed. Laboratory naerobe culture (MICRO) - PCR of nonhealing wound to right foot - (ICD10 L97.515 - Non- Bacteria identified in Unspecified specimen by A pressure chronic ulcer of other part of right foot with muscle involvement without evidence of necrosis) LOINC Code: 635-3 Convenience Name: Anaerobic culture Patient Medications llergies: Novolog U-100 Insulin aspart, penicillin A Notifications Medication Indication Start End 07/16/2022 lidocaine DOSE topical 4 % cream - cream topical 07/16/2022 levofloxacin DOSE oral 750 mg tablet - 1 tab p.o. daily x 10 days Electronic Signature(s) Signed: 07/16/2022 1:21:48 PM By: Duanne Guess MD FACS Signed: 07/16/2022 3:30:01 PM By: [name] Previous Signature: 07/16/2022 11:16:21 AM Version By: Duanne Guess MD FACS Previous Signature: 07/16/2022 9:40:28 AM Version By: Duanne Guess MD FACS Entered By: [name] on 07/16/2022 12:50:35 -------------------------------------------------------------------------------- Problem List Details Patient Name: Date of Service: Samuel Most MA R 07/16/2022 8:30 A M Medical Record Number: [id number] Patient Account Number: [id number] Date of Birth/Sex: Treating RN: 11-Apr-1965 (57 y.o. 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 02/02/2022 No Yes without evidence of necrosis L97.522 Non-pressure chronic ulcer of other part of left foot with fat layer exposed 02/02/2022 No Yes [place], Kaedyn ([id number]) [phone].pdf Page 6 of  10 E11.621 Type 2 diabetes mellitus with foot ulcer 02/02/2022 No Yes Z89.439 Acquired absence of unspecified foot 02/02/2022 No Yes Inactive Problems Resolved Problems Electronic Signature(s) Signed: 07/16/2022 9:32:12 AM By: Duanne Guess MD FACS Entered By: Duanne Guess on 07/16/2022 09:32:12 -------------------------------------------------------------------------------- Progress Note Details Patient Name: Date of Service: Samuel Most MA R 07/16/2022 8:30 A M Medical Record Number: [id number] Patient Account Number: [id number] Date of Birth/Sex: Treating RN: [birth year] (57 y.o. 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 The visit today was conducted with the assistance of the language line interpreter; the patient declines the use of an in person interpreter. He is a poorly controlled diabetic (last hemoglobin A1c 10.8, but it has been as high as 15 in the past) with CKD stage III and hypertension. 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 He required revision of the right TMA in October. This was done in the [facility] system. It is not entirely clear how he was referred to our clinic however he has openings on the distal portion of the right foot as well as drainage coming from the plantar aspect of his left foot underneath some callus. The patient has just been applying dry gauze to both  areas. 02/09/2022: The left-sided wound is quite a bit smaller today, but is covered with callus. The right sided wound has also contracted a little bit and is very clean, but there is also callus accumulation around the perimeter. Unfortunately, due to his Medicaid status, we have been unable to secure home health assistance and he has been changing the dressings on his own. He also has difficulty obtaining dressing supplies. 02/19/2022: Both wounds are smaller, particularly on the right. The left is covered with a layer of callus. No concern for infection. 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 There is light slough on the surface with some surrounding callus. The left plantar foot wound has callused over. Underneath the callus, the there is a small opening in the skin. 03/06/2022: The wounds are about the same size this week. There is callus accumulation around each, but the surfaces are cleaner. 03/19/2022: The wound on the right is quite a bit smaller this week. There is minimal callus accumulation and light slough on the surface. The wound on the left is almost completely covered in callus. 03/26/2022: The wound on the right continues to contract and is narrower again this week. The wound on the left has become covered in callus once again. 04/24/2022: In the month since he was last seen, apparently the patient developed some purulent drainage from his foot and was seen by podiatry at [facility]. He was debrided and given antibiotics. Subsequently, his wounds actually look extremely good today. There is callus covering the plantar ulcer on the left. The right TMA site is quite a bit narrower and clean with just some callus and slough accumulation. 05/01/2022: The wound on the plantar surface of his left foot is closed under a thick layer of callus. On the right, the wound is substantially smaller with just a little bit of slough but also heavy buildup of callus around  the wound opening. 05/08/2022: The left plantar foot ulcer remains closed. On the right, the wound continues to narrow significantly. There is still some depth to the wound, but it is clean. 05/22/2022: His wounds are closed. [place], [name] ([id number]) [id number] Page 7 of 10 05/29/2022: The wound on his right foot has reopened. This appears to be secondary to the callus cracking and causing a split in the underlying tissue. In addition, his daughter reports that he has been complaining of pain in the left foot. There is a fluctuant area at the end of the foot and it appears there is some darker discoloration under the callus that covers the site where the wound was. 06/06/2022: The wound on his right foot has healed down to a narrow slit underneath a truly  impressive amount of callus. The left foot has also tried to cover the wounded area with callus, but this is more open. There is slough buildup and some undermining secondary to the callus accumulation. The culture that I took last week only returned with very low levels of a variety of bacteria species and no antibiotics were prescribed. His foot is much less red and warm today. 06/14/2022: Both wounds are quite a bit smaller today with the usual callus accumulation. 07/16/2022: He returns after a month-long absence. He apparently saw podiatry on June 3 and they performed a debridement, but between then and today, he has built up substantial callus. It appears that moisture has gotten under the callus and caused substantial tissue breakdown. There is a foul odor coming from his right foot. No frank pus encountered. Patient History Family History Unknown History, Cancer, No family history of Diabetes, Heart Disease, Hereditary Spherocytosis, Hypertension, Kidney Disease, Lung Disease, Seizures, Stroke, Thyroid Problems, Tuberculosis. Social History Never smoker, Marital Status - Married, Alcohol Use - Never, Drug Use - No  History, Caffeine Use - Rarely. Medical History Hematologic/Lymphatic Patient has history of Lymphedema Cardiovascular Patient has history of Hypertension Endocrine Patient has history of Type II Diabetes Neurologic Patient has history of Neuropathy Medical A Surgical History Notes nd Genitourinary AKI Objective Constitutional no acute distress. Vitals Time Taken: 8:44 AM, Height: 72 in, Weight: 214 lbs, BMI: 29, Temperature: 99.9 F, Pulse: 91 bpm, Respiratory Rate: 18 breaths/min, Blood Pressure: 129/69 mmHg. Respiratory Normal work of breathing on room air. General Notes: 07/16/2022: He has built up substantial callus. It appears that moisture has gotten under the callus and caused substantial tissue breakdown. There is a foul odor coming from his right foot. No frank pus encountered. Integumentary (Hair, Skin) Wound #3 status is Open. Original cause of wound was Gradually Appeared. The date acquired was: 05/29/2022. The wound has been in treatment 6 weeks. The wound is located on the Right Foot. The wound measures 1cm length x 1.9cm width x 0.8cm depth; 1.492cm^2 area and 1.194cm^3 volume. There is Fat Layer (Subcutaneous Tissue) exposed. There is no tunneling noted, however, there is undermining starting at 12:00 and ending at 12:00 with a maximum distance of 1.4cm. There is a medium amount of serosanguineous drainage noted. The wound margin is thickened. There is large (67-100%) red granulation within the wound bed. There is a small (1-33%) amount of necrotic tissue within the wound bed including Eschar and Adherent Slough. The periwound skin appearance had no abnormalities noted for color. The periwound skin appearance exhibited: Callus, Dry/Scaly. Periwound temperature was noted as No Abnormality. Wound #4 status is Open. Original cause of wound was Hematoma. The date acquired was: 05/29/2022. The wound has been in treatment 6 weeks. The wound is located on the Left Amputation Site  - Transmetatarsal. The wound measures 0.5cm length x 0.4cm width x 0.4cm depth; 0.157cm^2 area and 0.063cm^3 volume. There is Fat Layer (Subcutaneous Tissue) exposed. There is no tunneling or undermining noted. There is a medium amount of sanguinous drainage noted. The wound margin is well defined and not attached to the wound base. There is large (67-100%) red granulation within the wound bed. There is a small (1- 33%) amount of necrotic tissue within the wound bed including Adherent Slough. The periwound skin appearance had no abnormalities noted for color. The periwound skin appearance exhibited: Callus, Dry/Scaly. Periwound temperature was noted as No Abnormality. The periwound has tenderness on palpation. Assessment Active Problems ICD-10 Non-pressure chronic ulcer of  other part of right foot with muscle involvement without evidence of necrosis Non-pressure chronic ulcer of other part of left foot with fat layer exposed Type 2 diabetes mellitus with foot ulcer Acquired absence of unspecified foot [place], [name] ([id number]) [id number] Page 8 of 10 Procedures Wound #3 Pre-procedure diagnosis of Wound #3 is a Diabetic Wound/Ulcer of the Lower Extremity located on the Right Foot .Severity of Tissue Pre Debridement is: Fat layer exposed. There was a Excisional Skin/Subcutaneous Tissue Debridement with a total area of 1.49 sq cm performed by Duanne Guess, MD. With the following instrument(s): Curette to remove Non-Viable tissue/material. Material removed includes Callus, Subcutaneous Tissue, Slough, and Skin: Epidermis after achieving pain control using Lidocaine 4% T opical Solution. 1 specimen was taken by a Tissue Culture and sent to the lab per facility protocol. A time out was conducted at 08:57, prior to the start of the procedure. A Minimum amount of bleeding was controlled with Pressure. The procedure was tolerated well. Post Debridement Measurements: 1cm  length x 1.9cm width x 0.8cm depth; 1.194cm^3 volume. Character of Wound/Ulcer Post Debridement is improved. Severity of Tissue Post Debridement is: Fat layer exposed. Post procedure Diagnosis Wound #3: Same as Pre-Procedure General Notes: scribed for Dr. Lady Gary by [name], RN. 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 There was a Excisional Skin/Subcutaneous Tissue Debridement with a total area of 0.16 sq cm performed by Duanne Guess, MD. With the following instrument(s): Curette to remove Non-Viable tissue/material. Material removed includes Callus, Subcutaneous Tissue, Slough, and Skin: Epidermis after achieving pain control using Lidocaine 4% Topical Solution. No specimens were taken. A time out was conducted at 08:57, prior to the start of the procedure. A Minimum amount of bleeding was controlled with Pressure. The procedure was tolerated well. Post Debridement Measurements: 0.5cm length x 0.4cm width x 0.4cm depth; 0.063cm^3 volume. Character of Wound/Ulcer Post Debridement is improved. Severity of Tissue Post Debridement is: Fat layer exposed. Post procedure Diagnosis Wound #4: Same as Pre-Procedure General Notes: scribed for Dr. Lady Gary by [name], RN. Plan Follow-up Appointments: Return Appointment in 1 week. - Dr. Lady Gary - room 1 Anesthetic: (In clinic) Topical Lidocaine 4% applied to wound bed Bathing/ Shower/ Hygiene: May shower and wash wound with soap and water. Non Wound Condition: Apply the following to affected area as directed: - urea cream 40% to callouses daily (do not put on open areas) Laboratory ordered were: Anaerobic culture - PCR of nonhealing wound to right foot The following medication(s) was prescribed: lidocaine topical 4 % cream cream topical was prescribed at  facility levofloxacin oral 750 mg tablet 1 tab p.o. daily x 10 days starting 07/16/2022 WOUND #3: - Foot Wound Laterality: Right Cleanser: Soap and Water 1 x Per Day/10 Days Discharge Instructions: May shower and wash wound with dial antibacterial soap and water prior to dressing change. Prim Dressing: Maxorb Extra Ag+ Alginate Dressing, 2x2 (in/in) 1 x Per Day/10 Days ary Discharge Instructions: Apply to wound bed as instructed Secondary Dressing: ALLEVYN Heel 4 1/2in x 5 1/2in / 10.5cm x 13.5cm 1 x Per Day/10 Days Discharge Instructions: Apply over primary dressing as directed. Secondary Dressing: Woven Gauze Sponge, Non-Sterile 4x4 in 1 x Per Day/10 Days Discharge Instructions: Apply over primary dressing as directed. Secured With: 35M Medipore H Soft Cloth Surgical T ape, 4 x 10 (in/yd) 1 x Per Day/10  Days Discharge Instructions: Secure with tape as directed. WOUND #4: - Amputation Site - Transmetatarsal Wound Laterality: Left Cleanser: Soap and Water 1 x Per Day/10 Days Discharge Instructions: May shower and wash wound with dial antibacterial soap and water prior to dressing change. Prim Dressing: Maxorb Extra Ag+ Alginate Dressing, 2x2 (in/in) 1 x Per Day/10 Days ary Discharge Instructions: Apply to wound bed as instructed Secondary Dressing: ALLEVYN Heel 4 1/2in x 5 1/2in / 10.5cm x 13.5cm 1 x Per Day/10 Days Discharge Instructions: Apply over primary dressing as directed. Secondary Dressing: Woven Gauze Sponge, Non-Sterile 4x4 in 1 x Per Day/10 Days Discharge Instructions: Apply over primary dressing as directed. Secured With: 34M Medipore H Soft Cloth Surgical T ape, 4 x 10 (in/yd) 1 x Per Day/10 Days Discharge Instructions: Secure with tape as directed. 07/16/2022: He has built up substantial callus. It appears that moisture has gotten under the callus and caused substantial tissue breakdown. There is a foul odor coming from his right foot. No frank pus encountered. I used a curette  to debride slough, callus, subcutaneous tissue. Once I got through this, I noted that the skin had broken down, as well. I used scissors and forceps to trim this away. There was some tissue that remained epithelialized but other areas were denuded. Once I had debrided both feet, I took a culture from the right foot due to the foul odor. I have empirically prescribed levofloxacin. Once culture data return, we will adjust his antibiotic therapy as indicated. He will apply silver alginate to both sites and cover of the ends of his feet with foam heel cups for protection. Follow-up in 1 week. I emphasized to him the importance of weekly follow-up to avoid further breakdown and deterioration of his wounds as occurred during the 1 month absence from our clinic. Electronic Signature(s) Signed: 07/16/2022 1:21:48 PM By: Duanne Guess MD FACS [place], [name] ([id number]) [id number] Page 9 of 10 Signed: 07/16/2022 3:30:01 PM By: [name] Previous Signature: 07/16/2022 9:46:46 AM Version By: Duanne Guess MD FACS Previous Signature: 07/16/2022 9:41:02 AM Version By: Duanne Guess MD FACS Entered By: [name] on 07/16/2022 12:50:49 -------------------------------------------------------------------------------- HxROS Details Patient Name: Date of Service: Samuel Most MA R 07/16/2022 8:30 A M Medical Record Number: [id number] Patient Account Number: [id number] Date of Birth/Sex: Treating RN: [birth year] (57 y.o. M) Primary Care Provider: Marrianne Mood Other Clinician: Referring Provider: Treating Provider/Extender: Alena Bills in Treatment: 23 Hematologic/Lymphatic Medical History: Positive for: Lymphedema Cardiovascular Medical History: Positive for: Hypertension Endocrine Medical History: Positive for: Type II Diabetes Treated with: Insulin, Oral agents Genitourinary Medical History: Past Medical History  Notes: AKI Neurologic Medical History: Positive for: Neuropathy Immunizations Pneumococcal Vaccine: Received Pneumococcal Vaccination: Yes Received Pneumococcal Vaccination On or After 60th Birthday: Yes Implantable Devices No devices added Family and Social History Unknown History: Yes; Cancer: Yes; Diabetes: No; Heart Disease: No; Hereditary Spherocytosis: No; Hypertension: No; Kidney Disease: No; Lung Disease: No; Seizures: No; Stroke: No; Thyroid Problems: No; Tuberculosis: No; Never smoker; Marital Status - Married; Alcohol Use: Never; Drug Use: No History; Caffeine Use: Rarely; Financial Concerns: No; Food, Clothing or Shelter Needs: No; Support System Lacking: No; Transportation Concerns: No Electronic Signature(s) Signed: 07/16/2022 11:16:21 AM By: Duanne Guess MD FACS Entered By: Duanne Guess on 07/16/2022 09:35:12 [name] ([id number]) [id number] Page 10 of 10 -------------------------------------------------------------------------------- SuperBill Details Patient Name: Date of Service: Samuel Most MA R 07/16/2022 Medical Record Number: [id number] Patient Account Number: [id number] Date of Birth/Sex:  Treating RN: [birth year] (57 y.o. 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 E11.621 Type 2 diabetes mellitus with foot ulcer Z89.439 Acquired absence of unspecified foot Facility Procedures : CPT4 Code: [id number] Description: 11042 - DEB SUBQ TISSUE 20 SQ CM/< ICD-10 Diagnosis Description L97.515 Non-pressure chronic ulcer of other part of right foot with muscle  involvement L97.522 Non-pressure chronic ulcer of other part of left foot with fat layer exposed Modifier: without evidence of n Quantity: 1 ecrosis Physician Procedures : CPT4 Code Description Modifier [id number] 99214 - WC PHYS LEVEL 4 - EST PT 25 ICD-10 Diagnosis Description 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 without evidence of n L97.522 Non-pressure chronic ulcer of  other part of left foot with fat layer exposed E11.621 Type 2 diabetes mellitus with foot ulcer Z89.439 Acquired absence of unspecified foot Quantity: 1 ecrosis : [id number] 11042 - WC PHYS SUBQ TISS 20 SQ CM ICD-10 Diagnosis Description 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

## 2022-07-16 NOTE — Progress Notes (Signed)
[name], [name] ([id number]) [id number] Page 1 of 9 Visit Report for 07/16/2022 Arrival Information Details Patient Name: Date of Service: Samuel Most MA R 07/16/2022 8:30 A M Medical Record Number: [id number] Patient Account Number: [id number] Date of Birth/Sex: Treating RN: 1965-10-27 (57 y.o. [name] Primary Care [name]: [name] Other Clinician: Referring [name]: Treating [name]: [name] in Treatment: 23 Visit Information History Since Last Visit Added or deleted any medications: No Patient Arrived: Ambulatory Any new allergies or adverse reactions: No Arrival Time: 08:44 Had a fall or experienced change in No Accompanied By: self activities of daily living that may affect Transfer Assistance: None risk of falls: Patient Identification Verified: Yes Signs or symptoms of abuse/neglect since last visito No Secondary Verification Process Completed: Yes Hospitalized since last visit: No Patient Requires Transmission-Based Precautions: No Implantable device outside of the clinic excluding No Patient Has Alerts: No cellular tissue based products placed in the center since last visit: Has Dressing in Place as Prescribed: Yes Pain Present Now: No Electronic Signature(s) Signed: 07/16/2022 3:30:01 PM By: [name] Entered By: [name] on 07/16/2022 08:44:33 -------------------------------------------------------------------------------- Encounter Discharge Information Details Patient Name: Date of Service: Samuel Most MA R 07/16/2022 8:30 A M Medical Record Number: [id number] Patient Account Number: [id number] Date of Birth/Sex: Treating RN: May 26, 1965 (57 y.o. [name]: [name] Other Clinician: Referring Beulah Matusek: Treating [name]/Extender: [name] in Treatment: 23 Encounter Discharge  Information Items Post Procedure Vitals Discharge Condition: Stable Temperature (F): 99.9 Ambulatory Status: Ambulatory Pulse (bpm): 91 Discharge Destination: Home Respiratory Rate (breaths/min): 18 Transportation: Private Auto Blood Pressure (mmHg): 129/69 Accompanied By: self Schedule Follow-up Appointment: Yes Clinical Summary of Care: Patient Declined Electronic Signature(s) Signed: 07/16/2022 3:30:01 PM By: [name] Entered By: [name] on 07/16/2022 12:51:19 [name], [name] ([id number]) [id number] Page 2 of 9 -------------------------------------------------------------------------------- Lower Extremity Assessment Details Patient Name: Date of Service: Samuel Most MA R 07/16/2022 8:30 A M Medical Record Number: [id number] Patient Account Number: [id number] Date of Birth/Sex: Treating RN: [birth year] (57 y.o. [name] Primary Care [name]: [name] Other Clinician: Referring [name]: Treating [name]/Extender: [name] in Treatment: 23 Edema Assessment Assessed: [name]: No] Franne Forts: No] Edema: [Left: No] [Right: No] Calf Left: Right: Point of Measurement: From Medial Instep 36.5 cm 37.5 cm Ankle Left: Right: Point of Measurement: From Medial Instep 25 cm 24 cm Electronic Signature(s) Signed: 07/16/2022 3:30:01 PM By: [name] Entered By: [name] on 07/16/2022 08:44:48 -------------------------------------------------------------------------------- Multi Wound Chart Details Patient Name: Date of Service: Samuel Most MA R 07/16/2022 8:30 A M Medical Record Number: [id number] Patient Account Number: [id number] Date of Birth/Sex: Treating RN: 09/07/1965 (57 y.o. M) Primary Care Jewelle Whitner: [name] Other Clinician: Referring [name]: Treating [name]: [name] in Treatment: 23 Vital Signs Height(in):  72 Pulse(bpm): 91 Weight(lbs): 214 Blood Pressure(mmHg): 129/69 Body Mass Index(BMI): 29 Temperature(F): 99.9 Respiratory Rate(breaths/min): 18 [3:Photos:] [N/A:N/A] Right Foot Left Amputation Site - Transmetatarsal N/A Wound Location: Gradually Appeared Hematoma N/A Wounding Event: Diabetic Wound/Ulcer of the Lower Diabetic Wound/Ulcer of the Lower N/A Primary Etiology: Extremity Extremity Lymphedema, Hypertension, Type II Lymphedema, Hypertension, Type II N/A Comorbid History: Diabetes, Neuropathy Diabetes, Neuropathy [name], [name] ([id number]) [id number] Page 3 of 9 05/29/2022 05/29/2022 N/A Date Acquired: 6 6 N/A Weeks of Treatment: Open Open N/A Wound Status: No No N/A Wound Recurrence: 1x1.9x0.8 0.5x0.4x0.4 N/A Measurements L x W x D (cm)  1.492 0.157 N/A A (cm) : rea 1.194 0.063 N/A Volume (cm) : -1788.60% 98.00% N/A % Reduction in A rea: -7362.50% 99.20% N/A % Reduction in Volume: 12 Starting Position 1 (o'clock): 12 Ending Position 1 (o'clock): 1.4 Maximum Distance 1 (cm): Yes No N/A Undermining: Grade 2 Grade 2 N/A Classification: Medium Medium N/A Exudate A mount: Serosanguineous Sanguinous N/A Exudate Type: red, brown red N/A Exudate Color: Thickened Well defined, not attached N/A Wound Margin: Large (67-100%) Large (67-100%) N/A Granulation A mount: Red Red N/A Granulation Quality: Small (1-33%) Small (1-33%) N/A Necrotic A mount: Eschar, Adherent Slough Adherent Slough N/A Necrotic Tissue: Fat Layer (Subcutaneous Tissue): Yes Fat Layer (Subcutaneous Tissue): Yes N/A Exposed Structures: Fascia: No Fascia: No Tendon: No Tendon: No Muscle: No Muscle: No Joint: No Joint: No Bone: No Bone: No Small (1-33%) Medium (34-66%) N/A Epithelialization: Debridement - Excisional Debridement - Excisional N/A Debridement: Pre-procedure Verification/Time Out 08:57 08:57 N/A Taken: Lidocaine 4% Topical Solution  Lidocaine 4% Topical Solution N/A Pain Control: Callus, Subcutaneous, Slough Callus, Subcutaneous, Slough N/A Tissue Debrided: Skin/Subcutaneous Tissue Skin/Subcutaneous Tissue N/A Level: 1.49 0.16 N/A Debridement A (sq cm): rea Curette Curette N/A Instrument: Minimum Minimum N/A Bleeding: Pressure Pressure N/A Hemostasis A chieved: Procedure was tolerated well Procedure was tolerated well N/A Debridement Treatment Response: 1x1.9x0.8 0.5x0.4x0.4 N/A Post Debridement Measurements L x W x D (cm) 1.194 0.063 N/A Post Debridement Volume: (cm) Callus: Yes Callus: Yes N/A Periwound Skin Texture: Dry/Scaly: Yes Dry/Scaly: Yes N/A Periwound Skin Moisture: No Abnormalities Noted No Abnormalities Noted N/A Periwound Skin Color: No Abnormality No Abnormality N/A Temperature: N/A Yes N/A Tenderness on Palpation: Debridement Debridement N/A Procedures Performed: Treatment Notes Electronic Signature(s) Signed: 07/16/2022 9:33:40 AM By: Duanne Guess MD FACS Entered By: Duanne Guess on 07/16/2022 09:33:40 -------------------------------------------------------------------------------- Multi-Disciplinary Care Plan Details Patient Name: Date of Service: Samuel Most MA R 07/16/2022 8:30 A M Medical Record Number: [id number] Patient Account Number: [id number] Date of Birth/Sex: Treating RN: [birth year] (57 y.o. [name] Primary Care Orry Sigl: [name] Other Clinician: Referring Sadiel Mota: Treating Solyana Nonaka/Extender: [name] in Treatment: 23 Multidisciplinary Care Plan reviewed with physician Active Inactive Nutrition [place], [place] ([id number]) [id number] Page 4 of 9 Nursing Diagnoses: Impaired glucose control: actual or potential Goals: Patient/caregiver verbalizes understanding of need to maintain therapeutic glucose control per primary care physician Date Initiated: 02/02/2022 Target Resolution  Date: 08/31/2022 Goal Status: Active Interventions: Assess patient nutrition upon admission and as needed per policy Provide education on elevated blood sugars and impact on wound healing Treatment Activities: Dietary management education, guidance and counseling : 02/02/2022 Notes: Wound/Skin Impairment Nursing Diagnoses: Impaired tissue integrity Goals: Patient/caregiver will verbalize understanding of skin care regimen Date Initiated: 03/06/2022 Target Resolution Date: 08/31/2022 Goal Status: Active Ulcer/skin breakdown will have a volume reduction of 30% by week 4 Date Initiated: 02/02/2022 Date Inactivated: 06/06/2022 Target Resolution Date: 06/27/2022 Unmet Reason: continues to grow Goal Status: Unmet callous Interventions: Assess ulceration(s) every visit Provide education on ulcer and skin care Treatment Activities: Patient referred to home care : 02/02/2022 Skin care regimen initiated : 02/02/2022 Notes: Electronic Signature(s) Signed: 07/16/2022 3:30:01 PM By: [name] Entered By: [name] on 07/16/2022 08:58:06 -------------------------------------------------------------------------------- Pain Assessment Details Patient Name: Date of Service: Samuel Most MA R 07/16/2022 8:30 A M Medical Record Number: [id number] Patient Account Number: [id number] Date of Birth/Sex: Treating RN: 01-14-1966 (57 y.o. [name] Primary Care Tesha Archambeau: [name] Other Clinician: Referring Azie Mcconahy: Treating Tekoa Hamor/Extender: Allena Katz  Weeks in Treatment: 23 Active Problems Location of Pain Severity and Description of Pain Patient Has Paino No Site Locations Rate the pain. [place], [name] ([id number]) [id number] Page 5 of 9 Rate the pain. Current Pain Level: 0 Pain Management and Medication Current Pain Management: Electronic Signature(s) Signed: 07/16/2022 3:30:01 PM By: [name] Entered By:  [name] on 07/16/2022 08:44:45 -------------------------------------------------------------------------------- Patient/Caregiver Education Details Patient Name: Date of Service: Samuel Most MA R 6/17/2024andnbsp8:30 A M Medical Record Number: [id number] Patient Account Number: [id number] Date of Birth/Gender: Treating RN: 12-14-1965 (57 y.o. [name] Primary Care Physician: [name] Other Clinician: Referring Physician: Treating Physician/Extender: [name] in Treatment: 23 Education Assessment Education Provided To: Patient Education Topics Provided Wound/Skin Impairment: Methods: Explain/Verbal Responses: Reinforcements needed, State content correctly Electronic Signature(s) Signed: 07/16/2022 3:30:01 PM By: [name] Entered By: [name] on 07/16/2022 08:58:19 -------------------------------------------------------------------------------- Wound Assessment Details Patient Name: Date of Service: Samuel Most MA R 07/16/2022 8:30 A M Medical Record Number: [id number] Patient Account Number: [id number] Date of Birth/Sex: Treating RN: 07/25/1965 (57 y.o. [name] Primary Care Anmol Fleck: [name] Other Clinician: Referring Helmut Hennon: Treating Milton Streicher/Extender: Allena Katz [place], [name] ([id number]) [id number] Page 6 of 9 Weeks in Treatment: 23 Wound Status Wound Number: 3 Primary Etiology: Diabetic Wound/Ulcer of the Lower Extremity Wound Location: Right Foot Wound Status: Open Wounding Event: Gradually Appeared Comorbid History: Lymphedema, Hypertension, Type II Diabetes, Neuropathy Date Acquired: 05/29/2022 Weeks Of Treatment: 6 Clustered Wound: No Photos Wound Measurements Length: (cm) 1 Width: (cm) 1.9 Depth: (cm) 0.8 Area: (cm) 1.492 Volume: (cm) 1.194 % Reduction in Area: -1788.6% % Reduction in Volume:  -7362.5% Epithelialization: Small (1-33%) Tunneling: No Undermining: Yes Starting Position (o'clock): 12 Ending Position (o'clock): 12 Maximum Distance: (cm) 1.4 Wound Description Classification: Grade 2 Wound Margin: Thickened Exudate Amount: Medium Exudate Type: Serosanguineous Exudate Color: red, brown Foul Odor After Cleansing: No Slough/Fibrino Yes Wound Bed Granulation Amount: Large (67-100%) Exposed Structure Granulation Quality: Red Fascia Exposed: No Necrotic Amount: Small (1-33%) Fat Layer (Subcutaneous Tissue) Exposed: Yes Necrotic Quality: Eschar, Adherent Slough Tendon Exposed: No Muscle Exposed: No Joint Exposed: No Bone Exposed: No Periwound Skin Texture Texture Color No Abnormalities Noted: No No Abnormalities Noted: Yes Callus: Yes Temperature / Pain Temperature: No Abnormality Moisture No Abnormalities Noted: No Dry / Scaly: Yes Treatment Notes Wound #3 (Foot) Wound Laterality: Right Cleanser Soap and Water Discharge Instruction: May shower and wash wound with dial antibacterial soap and water prior to dressing change. Peri-Wound Care Topical Primary Dressing Maxorb Extra Ag+ Alginate Dressing, 2x2 (in/in) Discharge Instruction: Apply to wound bed as instructed [facility], [name] ([id number]) [phone].pdf Page 7 of 9 Secondary Dressing ALLEVYN Heel 4 1/2in x 5 1/2in / 10.5cm x 13.5cm Discharge Instruction: Apply over primary dressing as directed. Woven Gauze Sponge, Non-Sterile 4x4 in Discharge Instruction: Apply over primary dressing as directed. Secured With 17M Medipore H Soft Cloth Surgical T ape, 4 x 10 (in/yd) Discharge Instruction: Secure with tape as directed. Compression Wrap Compression Stockings Add-Ons Electronic Signature(s) Signed: 07/16/2022 3:30:01 PM By: [name] Entered By: [name] on 07/16/2022  08:51:09 -------------------------------------------------------------------------------- Wound Assessment Details Patient Name: Date of Service: Samuel Most MA R 07/16/2022 8:30 A M Medical Record Number: [id number] Patient Account Number: [id number] Date of Birth/Sex: Treating RN: 1965-03-29 (57 y.o. [name] Primary Care Waver Dibiasio: [name] Other Clinician: Referring Landree Fernholz: Treating Brenly Trawick/Extender: [name] in Treatment: 23 Wound Status Wound Number:  4 Primary Etiology: Diabetic Wound/Ulcer of the Lower Extremity Wound Location: Left Amputation Site - Transmetatarsal Wound Status: Open Wounding Event: Hematoma Comorbid Lymphedema, Hypertension, Type II Diabetes, Neuropathy History: Date Acquired: 05/29/2022 Weeks Of Treatment: 6 Clustered Wound: No Photos Wound Measurements Length: (cm) Width: (cm) Depth: (cm) Area: (cm) Volume: (cm) 0.5 % Reduction in Area: 98% 0.4 % Reduction in Volume: 99.2% 0.4 Epithelialization: Medium (34-66%) 0.157 Tunneling: No 0.063 Undermining: No Wound Description Classification: Grade 2 Wound Margin: Well defined, not attached Exudate Amount: Medium Exudate Type: Sanguinous Exudate Color: red Christensen, Noor ([id number]) Wound Bed Granulation Amount: Large (67-100%) Granulation Quality: Red Necrotic Amount: Small (1-33%) Necrotic Quality: Adherent Slough Foul Odor After Cleansing: No Slough/Fibrino Yes [phone].pdf Page 8 of 9 Exposed Structure Fascia Exposed: No Fat Layer (Subcutaneous Tissue) Exposed: Yes Tendon Exposed: No Muscle Exposed: No Joint Exposed: No Bone Exposed: No Periwound Skin Texture Texture Color No Abnormalities Noted: No No Abnormalities Noted: Yes Callus: Yes Temperature / Pain Temperature: No Abnormality Moisture No Abnormalities Noted: No Tenderness on Palpation: Yes Dry / Scaly: Yes Treatment Notes Wound #4 (Amputation  Site - Transmetatarsal) Wound Laterality: Left Cleanser Soap and Water Discharge Instruction: May shower and wash wound with dial antibacterial soap and water prior to dressing change. Peri-Wound Care Topical Primary Dressing Maxorb Extra Ag+ Alginate Dressing, 2x2 (in/in) Discharge Instruction: Apply to wound bed as instructed Secondary Dressing ALLEVYN Heel 4 1/2in x 5 1/2in / 10.5cm x 13.5cm Discharge Instruction: Apply over primary dressing as directed. Woven Gauze Sponge, Non-Sterile 4x4 in Discharge Instruction: Apply over primary dressing as directed. Secured With 31M Medipore H Soft Cloth Surgical T ape, 4 x 10 (in/yd) Discharge Instruction: Secure with tape as directed. Compression Wrap Compression Stockings Add-Ons Electronic Signature(s) Signed: 07/16/2022 3:30:01 PM By: [name] Entered By: [name] on 07/16/2022 08:51:28 -------------------------------------------------------------------------------- Vitals Details Patient Name: Date of Service: Samuel Most MA R 07/16/2022 8:30 A M Medical Record Number: [id number] Patient Account Number: [id number] Date of Birth/Sex: Treating RN: [birth year] (57 y.o. [name] Primary Care Kateryn Marasigan: [name] Other Clinician: Referring Dilan Fullenwider: Treating Celester Morgan/Extender: [name] in Treatment: 23 Vital Signs Time Taken: 08:44 Temperature (F): 99.9 Height (in): 72 Pulse (bpm): 91 Weight (lbs): 214 Respiratory Rate (breaths/min): 18 [place], [place] ([id number]) [phone].pdf Page 9 of 9 Body Mass Index (BMI): 29 Blood Pressure (mmHg): 129/69 Reference Range: 80 - 120 mg / dl Electronic Signature(s) Signed: 07/16/2022 3:30:01 PM By: [name] Entered By: [name] on 07/16/2022 08:45:07

## 2022-07-18 ENCOUNTER — Other Ambulatory Visit (HOSPITAL_COMMUNITY): Payer: Self-pay

## 2022-07-18 MED ORDER — METRONIDAZOLE 500 MG PO TABS
500.0000 mg | ORAL_TABLET | Freq: Three times a day (TID) | ORAL | 0 refills | Status: AC
  Filled 2022-07-18: qty 30, 10d supply, fill #0

## 2022-07-18 MED ORDER — SULFAMETHOXAZOLE-TRIMETHOPRIM 800-160 MG PO TABS
1.0000 | ORAL_TABLET | Freq: Two times a day (BID) | ORAL | 0 refills | Status: AC
  Filled 2022-07-18: qty 20, 10d supply, fill #0

## 2022-07-24 ENCOUNTER — Encounter (HOSPITAL_BASED_OUTPATIENT_CLINIC_OR_DEPARTMENT_OTHER): Payer: Medicaid Other | Admitting: General Surgery

## 2022-07-24 DIAGNOSIS — E11621 Type 2 diabetes mellitus with foot ulcer: Secondary | ICD-10-CM | POA: Diagnosis not present

## 2022-07-24 NOTE — Progress Notes (Signed)
Deaver, [name] ([id number]) [id number] Page 1 of 10 Visit Report for 07/24/2022 Chief Complaint Document Details Patient Name: Date of Service: [name] 07/24/2022 8:15 A M Medical Record Number: [id number] Patient Account Number: [id number] Date of Birth/Sex: Treating RN: Oct 02, 1965 (57 y.o. 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 [name] Primary Care Provider: Marrianne Mood Other Clinician: Referring Provider: Treating Provider/Extender: Alena Bills in Treatment: 24 Debridement Performed for Assessment: Wound #3 Right Foot Performed By: Physician Duanne Guess, MD Debridement Type: Debridement Severity of Tissue Pre Debridement: Fat layer exposed Level of Consciousness (Pre-procedure): Awake and Alert Pre-procedure Verification/Time Out Yes - 08:45 Taken: Start Time: 08:48 Pain Control: Lidocaine 4% Topical Solution Percent of Wound Bed Debrided: 125% T Area Debrided (cm): otal 4.12 Tissue and other material debrided: Non-Viable, Callus, Slough, Subcutaneous, Slough Level: Skin/Subcutaneous Tissue Debridement  Description: Excisional Instrument: Curette Bleeding: Minimum Hemostasis Achieved: Pressure Procedural Pain: 0 Post Procedural Pain: 0 Response to Treatment: Procedure was tolerated well Level of Consciousness (Post- Awake and Alert procedure): Post Debridement Measurements of Total Wound Length: (cm) 2.1 Width: (cm) 2 Depth: (cm) 0.5 Volume: (cm) 1.649 Character of Wound/Ulcer Post Debridement: Improved [name] ([id number]) [id number] Page 2 of 10 Severity of Tissue Post Debridement: Fat layer exposed Post Procedure Diagnosis Same as Pre-procedure Notes Scribed for Dr Lady Gary by Redmond Pulling, RN Electronic Signature(s) Signed: 07/24/2022 9:24:19 AM By: Duanne Guess MD FACS Signed: 07/24/2022 3:30:06 PM By: Redmond Pulling RN, BSN Entered By: Redmond Pulling on 07/24/2022 08:51:02 -------------------------------------------------------------------------------- Debridement Details Patient Name: Date of Service: [name] 07/24/2022 8:15 A M Medical Record Number: [id number] Patient Account Number: [id number] Date of Birth/Sex: Treating RN: 1965-12-03 (57 y.o. [name] Primary Care Provider: Marrianne Mood Other Clinician: Referring Provider: Treating Provider/Extender: Alena Bills in Treatment: 24 Debridement Performed for Assessment: Wound #4 Left Amputation Site - Transmetatarsal Performed By: Physician Duanne Guess, MD Debridement Type: Debridement Severity of Tissue Pre Debridement: Fat layer exposed Level of Consciousness (Pre-procedure): Awake and Alert Pre-procedure Verification/Time Out Yes - 08:45 Taken: Start Time: 08:48 Pain Control: Lidocaine 4% Topical Solution Percent of Wound Bed Debrided: 125% T Area Debrided (cm): otal 0.69 Tissue and other material debrided: Non-Viable, Callus, Slough, Subcutaneous, Slough Level: Skin/Subcutaneous Tissue Debridement Description:  Excisional Instrument: Curette Bleeding: Minimum Hemostasis Achieved: Pressure Procedural Pain: 0 Post Procedural Pain: 0 Response to Treatment: Procedure was tolerated well Level of Consciousness (Post- Awake and Alert procedure): Post Debridement Measurements of Total Wound Length: (cm) 0.7 Width: (cm) 1 Depth: (cm) 0.2 Volume: (cm) 0.11 Character of Wound/Ulcer Post Debridement: Improved Severity of Tissue Post Debridement: Fat layer exposed Post Procedure Diagnosis Same as Pre-procedure Notes Scribed for Dr Lady Gary by Redmond Pulling, RN Electronic Signature(s) Signed: 07/24/2022 9:24:19 AM By: Duanne Guess MD FACS Signed: 07/24/2022 3:30:06 PM By: Redmond Pulling RN, BSN Entered By: Redmond Pulling on  07/24/2022 08:55:40 [name] ([id number]) [id number] Page 3 of 10 -------------------------------------------------------------------------------- HPI Details Patient Name: Date of Service: [name] 07/24/2022 8:15 A M Medical Record Number: [id number] Patient Account Number: [id number] Date of Birth/Sex: Treating RN: [birth year] (57 y.o. M) Primary Care Provider: Marrianne Mood Other Clinician: Referring Provider: Treating Provider/Extender: Alena Bills in Treatment: 24 History of Present Illness HPI Description: ADMISSION 02/02/2022 This is a 57 year old [place] man who speaks only Arabic. The visit today was conducted with the assistance of the language line interpreter; the patient declines the use of an in person interpreter. He is a poorly controlled diabetic (last hemoglobin A1c 10.8, but it has been as high as 15 in the past) with CKD stage III and hypertension. 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 He required revision of the right TMA in October. This was done in the [facility] system. It is not  entirely clear how he was referred to our clinic however he has openings on the distal portion of the right foot as well as drainage coming from the plantar aspect of his left foot underneath some callus. The patient has just been applying dry gauze to both areas. 02/09/2022: The left-sided wound is quite a bit smaller today, but is covered with callus. The right sided wound has also contracted a little bit and is very clean, but there is also callus accumulation around the perimeter. Unfortunately, due to his Medicaid status, we have been unable to secure home health assistance and he has been changing the dressings on his own. He also has difficulty obtaining dressing supplies. 02/19/2022: Both wounds are smaller, particularly on the right. The left is covered with a layer of callus. No concern for infection. 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 There is light slough on the surface with some surrounding callus. The left plantar foot wound has callused over. Underneath the callus, the there is a small opening in the skin. 03/06/2022: The wounds are about the same size this week. There is callus accumulation around each, but the surfaces are cleaner. 03/19/2022: The wound on the right is quite a bit smaller this week. There is minimal callus accumulation and light slough on the surface. The wound on the left is almost completely covered in callus. 03/26/2022: The wound on the right continues to contract and is narrower again this week. The wound on the left has become covered in callus once again. 04/24/2022: In the month since he was last seen, apparently the patient developed some purulent drainage from his foot and was seen by podiatry at [facility]. He was debrided and given antibiotics. Subsequently, his wounds actually look extremely good today. There is callus covering the plantar ulcer on the left. The right TMA site is quite a bit narrower and clean with just some  callus and slough accumulation. 05/01/2022: The wound on the plantar surface of his left foot is closed under a thick layer of callus. On the right, the wound is substantially smaller with just a little bit of slough but also heavy buildup of callus around the wound opening. 05/08/2022: The left plantar foot ulcer remains closed. On the right, the wound continues to narrow significantly. There is still some depth to the wound, but it is clean. 05/22/2022: His wounds are closed. 05/29/2022: The wound on his right foot has reopened. This appears to be secondary to the callus cracking and causing a  split in the underlying tissue. In addition, his daughter reports that he has been complaining of pain in the left foot. There is a fluctuant area at the end of the foot and it appears there is some darker discoloration under the callus that covers the site where the wound was. 06/06/2022: The wound on his right foot has healed down to a narrow slit underneath a truly impressive amount of callus. The left foot has also tried to cover the wounded area with callus, but this is more open. There is slough buildup and some undermining secondary to the callus accumulation. The culture that I took last week only returned with very low levels of a variety of bacteria species and no antibiotics were prescribed. His foot is much less red and warm today. 06/14/2022: Both wounds are quite a bit smaller today with the usual callus accumulation. 07/16/2022: He returns after a month-long absence. He apparently saw podiatry on June 3 and they performed a debridement, but between then and today, he has built up substantial callus. It appears that moisture has gotten under the callus and caused substantial tissue breakdown. There is a foul odor coming from his right foot. No frank pus encountered. 07/24/2022: Both wounds look substantially better this week. There is no malodor present. He has his usual accumulation of thick callus. His  culture grew out a vastly polymicrobial population with multiple drug resistances. He is currently taking 3 different antibiotics to address the sensitivities. Electronic Signature(s) Signed: 07/24/2022 9:20:40 AM By: Duanne Guess MD FACS Entered By: Duanne Guess on 07/24/2022 09:20:40 [name] ([id number]) [id number] Page 4 of 10 -------------------------------------------------------------------------------- Physical Exam Details Patient Name: Date of Service: [name] 07/24/2022 8:15 A M Medical Record Number: [id number] Patient Account Number: [id number] Date of Birth/Sex: Treating RN: 1965-12-02 (57 y.o. M) Primary Care Provider: Marrianne Mood Other Clinician: Referring Provider: Treating Provider/Extender: Alena Bills in Treatment: 24 Constitutional . . . . no acute distress. Respiratory Normal work of breathing on room air. Notes 07/24/2022: Both wounds look substantially better this week. There is no malodor present. He has his usual accumulation of thick callus. Electronic Signature(s) Signed: 07/24/2022 9:21:52 AM By: Duanne Guess MD FACS Entered By: Duanne Guess on 07/24/2022 09:21:52 -------------------------------------------------------------------------------- Physician Orders Details Patient Name: Date of Service: [name] 07/24/2022 8:15 A M Medical Record Number: [id number] Patient Account Number: [id number] Date of Birth/Sex: Treating RN: 1965/11/19 (57 y.o. 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 - Dr. Lady Gary - overflow Return A Tuesday 07/31/22 @ 0800 ppointment in 2 weeks. - Dr Lady Gary Return A Tuesday 08/07/22 @ 0915 Anesthetic (In clinic) Topical Lidocaine 4% applied to wound bed Bathing/ Shower/ Hygiene May shower and wash wound with soap and water. Non Wound Condition pply the following to affected area as directed: - urea cream 40% to callouses daily (do not put on open areas) A Wound Treatment Wound #3 - Foot Wound Laterality: Right Cleanser: Soap and Water 1 x Per Day/10 Days Discharge Instructions:  May shower and wash wound with dial antibacterial soap and water prior to dressing change. Prim Dressing: Maxorb Extra Ag+ Alginate Dressing, 2x2 (in/in) 1 x Per Day/10 Days ary Discharge Instructions: Apply to wound bed as instructed Secondary Dressing: ALLEVYN Heel 4 1/2in x 5 1/2in / 10.5cm x 13.5cm 1 x Per Day/10 Days [name], [name] ([id number]) [id number] Page 5 of 10 Discharge Instructions: Apply over primary dressing as directed. Secondary Dressing: Woven Gauze Sponge, Non-Sterile 4x4 in 1 x Per Day/10 Days Discharge Instructions: Apply over primary dressing as directed. Secured With: 46M Medipore H Soft Cloth Surgical T ape, 4 x 10 (in/yd) 1 x Per Day/10 Days Discharge Instructions: Secure with tape as directed. Wound #4 - Amputation Site - Transmetatarsal Wound Laterality: Left Cleanser: Soap and Water 1 x Per Day/10 Days Discharge Instructions: May shower and wash wound with dial antibacterial soap and water prior to dressing change. Prim Dressing: Maxorb Extra Ag+ Alginate Dressing, 2x2 (in/in) 1 x Per Day/10 Days ary Discharge Instructions: Apply to wound bed as instructed Secondary Dressing: ALLEVYN Heel 4 1/2in x 5 1/2in / 10.5cm x 13.5cm 1 x Per Day/10  Days Discharge Instructions: Apply over primary dressing as directed. Secondary Dressing: Woven Gauze Sponge, Non-Sterile 4x4 in 1 x Per Day/10 Days Discharge Instructions: Apply over primary dressing as directed. Secured With: 46M Medipore H Soft Cloth Surgical T ape, 4 x 10 (in/yd) 1 x Per Day/10 Days Discharge Instructions: Secure with tape as directed. Patient Medications llergies: Novolog U-100 Insulin aspart, penicillin A Notifications Medication Indication Start End 07/24/2022 lidocaine DOSE topical 4 % cream - cream topical once daily Electronic Signature(s) Signed: 07/24/2022 9:24:19 AM By: Duanne Guess MD FACS Entered By: Duanne Guess on 07/24/2022 09:22:36 -------------------------------------------------------------------------------- Problem List Details Patient Name: Date of Service: [name] 07/24/2022 8:15 A M Medical Record Number: [id number] Patient Account Number: [id number] Date of Birth/Sex: Treating RN: [birth year] (57 y.o. 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 02/02/2022 No Yes without evidence of necrosis L97.522 Non-pressure chronic ulcer of other part of left foot with fat layer exposed 02/02/2022 No Yes E11.621 Type 2 diabetes mellitus with foot ulcer 02/02/2022 No Yes Z89.439 Acquired absence of unspecified foot 02/02/2022 No Yes [name], [name] ([id number]) [id number] Page 6 of 10 Inactive Problems Resolved Problems Electronic Signature(s) Signed: 07/24/2022 9:19:30 AM By: Duanne Guess MD FACS Entered By: Duanne Guess on 07/24/2022  09:19:30 -------------------------------------------------------------------------------- Progress Note Details Patient Name: Date of Service: [name] 07/24/2022 8:15 A M Medical Record Number: [id number] Patient Account Number: [id number] Date of Birth/Sex: Treating RN: [birth year] (57 y.o. 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 The visit today was conducted with the assistance of the language line interpreter; the patient declines the use of an in person interpreter. He is a poorly controlled diabetic (last hemoglobin A1c 10.8, but it has been as high as 15 in the past)  with CKD stage III and hypertension. 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 He required revision of the right TMA in October. This was done in the [facility] system. It is not entirely clear how he was referred to our clinic however he has openings on the distal portion of the right foot as well as drainage coming from the plantar aspect of his left foot underneath some callus. The patient has just been applying dry gauze to both areas. 02/09/2022: The left-sided wound is quite a bit smaller today, but is covered with callus. The right sided wound has also contracted a little bit and is very clean, but there is also callus accumulation around the perimeter. Unfortunately, due to his Medicaid status, we have been unable to secure  home health assistance and he has been changing the dressings on his own. He also has difficulty obtaining dressing supplies. 02/19/2022: Both wounds are smaller, particularly on the right. The left is covered with a layer of callus. No concern for infection. 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 There is light slough on the surface with some surrounding callus. The left plantar foot wound has callused over. Underneath the callus, the there is a small opening in the skin. 03/06/2022: The wounds are about the same size this week. There is callus accumulation around each, but the surfaces are cleaner. 03/19/2022: The wound on the right is quite a bit smaller this week. There is minimal callus accumulation and light slough on the surface. The wound on the left is almost completely covered in callus. 03/26/2022: The wound on the right continues to contract and is narrower again this week. The wound on the left has become covered in callus once again. 04/24/2022: In the month since he was last seen, apparently the patient developed some purulent drainage from his foot and was seen by podiatry at [facility]. He was debrided and given antibiotics. Subsequently, his wounds actually look extremely good today. There is callus covering the plantar ulcer on the left. The right TMA site is quite a bit narrower and clean with just some callus and slough accumulation. 05/01/2022: The wound on the plantar surface of his left foot is closed under a thick layer of callus. On the right, the wound is substantially smaller with just a little bit of slough but also heavy buildup of callus around the wound opening. 05/08/2022: The left plantar foot ulcer remains closed. On the right, the wound continues to narrow significantly. There is still some depth to the wound, but it is clean. 05/22/2022: His wounds are closed. 05/29/2022: The wound on his right foot has reopened. This appears to be secondary  to the callus cracking and causing a split in the underlying tissue. In addition, his daughter reports that he has been complaining of pain in the left foot. There is a fluctuant area at the end of the foot and it appears there is some darker discoloration under the callus that covers the site where the wound was. 06/06/2022: The wound on his right foot has healed down to a narrow slit underneath a truly impressive amount of callus. The left foot has also tried to cover the wounded area with callus, but this is more open. There is slough buildup and some undermining secondary to the callus accumulation. The culture that I took last week only returned with very low levels of a variety of bacteria species and no antibiotics were prescribed.  His foot is much less red and warm today. 06/14/2022: Both wounds are quite a bit smaller today with the usual callus accumulation. [place], [name] ([id number]) [id number] Page 7 of 10 07/16/2022: He returns after a month-long absence. He apparently saw podiatry on June 3 and they performed a debridement, but between then and today, he has built up substantial callus. It appears that moisture has gotten under the callus and caused substantial tissue breakdown. There is a foul odor coming from his right foot. No frank pus encountered. 07/24/2022: Both wounds look substantially better this week. There is no malodor present. He has his usual accumulation of thick callus. His culture grew out a vastly polymicrobial population with multiple drug resistances. He is currently taking 3 different antibiotics to address the sensitivities. Patient History Family History Unknown History, Cancer, No family history of Diabetes, Heart Disease, Hereditary Spherocytosis, Hypertension, Kidney Disease, Lung Disease, Seizures, Stroke, Thyroid Problems, Tuberculosis. Social History Never smoker, Marital Status - Married, Alcohol Use - Never, Drug Use - No History,  Caffeine Use - Rarely. Medical History Hematologic/Lymphatic Patient has history of Lymphedema Cardiovascular Patient has history of Hypertension Endocrine Patient has history of Type II Diabetes Neurologic Patient has history of Neuropathy Medical A Surgical History Notes nd Genitourinary AKI Objective Constitutional no acute distress. Vitals Time Taken: 8:29 AM, Height: 72 in, Weight: 214 lbs, BMI: 29, Temperature: 98.4 F, Pulse: 84 bpm, Respiratory Rate: 20 breaths/min, Blood Pressure: 136/68 mmHg. Respiratory Normal work of breathing on room air. General Notes: 07/24/2022: Both wounds look substantially better this week. There is no malodor present. He has his usual accumulation of thick callus. Integumentary (Hair, Skin) Wound #3 status is Open. Original cause of wound was Gradually Appeared. The date acquired was: 05/29/2022. The wound has been in treatment 8 weeks. The wound is located on the Right Foot. The wound measures 2.1cm length x 2cm width x 0.5cm depth; 3.299cm^2 area and 1.649cm^3 volume. There is Fat Layer (Subcutaneous Tissue) exposed. There is no tunneling or undermining noted. There is a medium amount of serosanguineous drainage noted. The wound margin is thickened. There is large (67-100%) red granulation within the wound bed. There is a small (1-33%) amount of necrotic tissue within the wound bed including Eschar and Adherent Slough. The periwound skin appearance had no abnormalities noted for color. The periwound skin appearance exhibited: Callus, Dry/Scaly. Periwound temperature was noted as No Abnormality. Wound #4 status is Open. Original cause of wound was Hematoma. The date acquired was: 05/29/2022. The wound has been in treatment 8 weeks. The wound is located on the Left Amputation Site - Transmetatarsal. The wound measures 0.7cm length x 1cm width x 0.2cm depth; 0.55cm^2 area and 0.11cm^3 volume. There is Fat Layer (Subcutaneous Tissue) exposed. There is no  tunneling or undermining noted. There is a medium amount of sanguinous drainage noted. The wound margin is well defined and not attached to the wound base. There is large (67-100%) red granulation within the wound bed. There is a small (1-33%) amount of necrotic tissue within the wound bed including Adherent Slough. The periwound skin appearance had no abnormalities noted for color. The periwound skin appearance exhibited: Callus, Dry/Scaly. Periwound temperature was noted as No Abnormality. The periwound has tenderness on palpation. 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 There was a Excisional Skin/Subcutaneous Tissue Debridement with a total area of 4.12 sq cm performed by Duanne Guess, MD. With the following instrument(s): Curette to remove Non-Viable tissue/material. Material removed includes Callus, Subcutaneous Tissue, and Slough after achieving pain control using Lidocaine 4% T opical Solution. No specimens were taken. A time out was conducted at 08:45, prior to the start of the procedure. A Minimum amount of bleeding was controlled with Pressure. The procedure was tolerated well with a pain level of 0 throughout and a pain level of 0 following the procedure. Post Debridement Measurements: 2.1cm length x 2cm width x 0.5cm depth; 1.649cm^3 volume. Character of Wound/Ulcer Post Debridement is improved. Severity of Tissue Post Debridement is: Fat layer exposed. Post  procedure Diagnosis Wound #3: Same as Pre-Procedure General Notes: Scribed for Dr Lady Gary by Redmond Pulling, RN. 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 There was a Excisional Skin/Subcutaneous Tissue Debridement with a total area of 0.69 sq cm performed by Duanne Guess, MD. With the following instrument(s): Curette to remove Non-Viable tissue/material. Material removed includes Callus, Subcutaneous Tissue, and Slough after achieving pain control using Lidocaine 4% Topical Solution. No specimens were taken. A time out was conducted at 08:45, prior to the start of the procedure. A Minimum amount of bleeding was controlled with Pressure. The procedure was tolerated well with a pain level of 0 throughout and a pain level of 0 following the procedure. Post Debridement Measurements: 0.7cm length x 1cm width x 0.2cm depth; 0.11cm^3 volume. Character of Wound/Ulcer Post Debridement is improved. Severity of Tissue Post Debridement is: Fat layer exposed. Post procedure Diagnosis Wound #4: Same as Pre-Procedure General Notes: Scribed for Dr Lady Gary by Redmond Pulling, RN. Plan Follow-up Appointments: Return Appointment in 1 week. - Dr. Lady Gary - overflow Tuesday 07/31/22 @ 0800 Return Appointment in 2 weeks. - Dr Lady Gary Tuesday 08/07/22 @ 0915 Anesthetic: (In clinic) Topical Lidocaine 4% applied to wound bed Bathing/ Shower/ Hygiene: May shower and wash wound with soap and water. Non Wound Condition: Apply the following to affected area as directed: - urea cream 40% to callouses daily (do not put on open areas) The following medication(s) was prescribed: lidocaine topical 4 % cream cream topical once daily was prescribed at facility WOUND #3: - Foot Wound Laterality: Right Cleanser: Soap and Water 1 x Per Day/10 Days Discharge Instructions:  May shower and wash wound with dial antibacterial soap and water prior to dressing change. Prim Dressing: Maxorb Extra Ag+ Alginate Dressing, 2x2 (in/in) 1 x Per Day/10 Days ary Discharge Instructions: Apply to wound bed as instructed Secondary Dressing: ALLEVYN Heel 4 1/2in x 5 1/2in / 10.5cm x 13.5cm 1 x Per Day/10 Days Discharge Instructions: Apply over primary dressing as directed. Secondary Dressing: Woven Gauze Sponge, Non-Sterile 4x4 in 1 x Per Day/10 Days Discharge Instructions: Apply over primary dressing as directed. Secured With: 32M Medipore H Soft Cloth Surgical T ape, 4 x 10 (in/yd) 1 x Per Day/10 Days Discharge Instructions: Secure with tape as directed. WOUND #4: - Amputation Site - Transmetatarsal Wound Laterality: Left Cleanser: Soap and Water 1 x Per Day/10 Days Discharge Instructions: May  shower and wash wound with dial antibacterial soap and water prior to dressing change. Prim Dressing: Maxorb Extra Ag+ Alginate Dressing, 2x2 (in/in) 1 x Per Day/10 Days ary Discharge Instructions: Apply to wound bed as instructed Secondary Dressing: ALLEVYN Heel 4 1/2in x 5 1/2in / 10.5cm x 13.5cm 1 x Per Day/10 Days Discharge Instructions: Apply over primary dressing as directed. Secondary Dressing: Woven Gauze Sponge, Non-Sterile 4x4 in 1 x Per Day/10 Days Discharge Instructions: Apply over primary dressing as directed. Secured With: 55M Medipore H Soft Cloth Surgical T ape, 4 x 10 (in/yd) 1 x Per Day/10 Days Discharge Instructions: Secure with tape as directed. 07/24/2022: Both wounds look substantially better this week. There is no malodor present. He has his usual accumulation of thick callus. I used a curette to debride callus, slough, and subcutaneous tissue from both of his wounds. We will continue to apply silver alginate with foam heel cups to protect the wound sites. The antibiotics seem to have made a significant difference. He will complete his course of oral levofloxacin,  metronidazole, and double strength Bactrim. Follow-up in 1 week. Electronic Signature(s) Signed: 07/24/2022 9:23:28 AM By: Duanne Guess MD FACS Entered By: Duanne Guess on 07/24/2022 40:98:11 BJYNW, [name] ([id number]) [id number] Page 9 of 10 -------------------------------------------------------------------------------- HxROS Details Patient Name: Date of Service: [name] 07/24/2022 8:15 A M Medical Record Number: [id number] Patient Account Number: [id number] Date of Birth/Sex: Treating RN: 03-26-1965 (57 y.o. M) Primary Care Provider: Marrianne Mood Other Clinician: Referring Provider: Treating Provider/Extender: Alena Bills in Treatment: 24 Hematologic/Lymphatic Medical History: Positive for: Lymphedema Cardiovascular Medical History: Positive for: Hypertension Endocrine Medical History: Positive for: Type II Diabetes Treated with: Insulin, Oral agents Genitourinary Medical History: Past Medical History Notes: AKI Neurologic Medical History: Positive for: Neuropathy Immunizations Pneumococcal Vaccine: Received Pneumococcal Vaccination: Yes Received Pneumococcal Vaccination On or After 60th Birthday: Yes Implantable Devices No devices added Family and Social History Unknown History: Yes; Cancer: Yes; Diabetes: No; Heart Disease: No; Hereditary Spherocytosis: No; Hypertension: No; Kidney Disease: No; Lung Disease: No; Seizures: No; Stroke: No; Thyroid Problems: No; Tuberculosis: No; Never smoker; Marital Status - Married; Alcohol Use: Never; Drug Use: No History; Caffeine Use: Rarely; Financial Concerns: No; Food, Clothing or Shelter Needs: No; Support System Lacking: No; Transportation Concerns: No Electronic Signature(s) Signed: 07/24/2022 9:24:19 AM By: Duanne Guess MD FACS Entered By: Duanne Guess on 07/24/2022  09:21:31 -------------------------------------------------------------------------------- SuperBill Details Patient Name: Date of Service: [name] 07/24/2022 Medical Record Number: [id number] Patient Account Number: [id number] Date of Birth/Sex: Treating RN: 1965-02-21 (57 y.o. 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 E11.621 Type 2 diabetes mellitus with foot ulcer Z89.439 Acquired absence of unspecified foot Facility Procedures : CPT4 Code: [id number] Description: 11042 - DEB SUBQ TISSUE 20 SQ CM/< ICD-10 Diagnosis Description 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 Modifier: without evidence of n Quantity: 1 ecrosis Physician Procedures : CPT4 Code Description Modifier [id number] 99214 - WC PHYS LEVEL 4 - EST PT 25 ICD-10 Diagnosis Description L97.515  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 without evidence of n L97.522 Non-pressure chronic ulcer of  other part of left foot with fat layer exposed E11.621 Type 2 diabetes mellitus with foot ulcer Z89.439 Acquired absence of unspecified foot Quantity: 1 ecrosis : [id number] 11042 - WC PHYS SUBQ TISS 20 SQ CM ICD-10 Diagnosis Description 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

## 2022-07-24 NOTE — Progress Notes (Signed)
[place], [name] ([id number]) [id number] Page 1 of 9 Visit Report for 07/24/2022 Arrival Information Details Patient Name: Date of Service: [name] 07/24/2022 8:15 A M Medical Record Number: [id number] Patient Account Number: [id number] Date of Birth/Sex: Treating RN: 06-08-1965 (57 y.o. Cline Cools Primary Care Tawona Filsinger: Marrianne Mood Other Clinician: Referring Tadashi Burkel: Treating Georgio Hattabaugh/Extender: Alena Bills in Treatment: 24 Visit Information History Since Last Visit Added or deleted any medications: No Patient Arrived: Ambulatory Any new allergies or adverse reactions: No Arrival Time: 08:28 Had a fall or experienced change in No Accompanied By: self activities of daily living that may affect Transfer Assistance: None risk of falls: Patient Identification Verified: Yes Signs or symptoms of abuse/neglect since last visito No Secondary Verification Process Completed: Yes Hospitalized since last visit: No Patient Requires Transmission-Based Precautions: No Implantable device outside of the clinic excluding No Patient Has Alerts: No cellular tissue based products placed in the center since last visit: Has Dressing in Place as Prescribed: No Pain Present Now: No Electronic Signature(s) Signed: 07/24/2022 3:30:06 PM By: Redmond Pulling RN, BSN Entered By: Redmond Pulling on 07/24/2022 08:34:14 -------------------------------------------------------------------------------- Encounter Discharge Information Details Patient Name: Date of Service: [name] 07/24/2022 8:15 A M Medical Record Number: [id number] Patient Account Number: [id number] Date of Birth/Sex: Treating RN: 06/05/1965 (57 y.o. Cline Cools Primary Care Montrell Cessna: Marrianne Mood Other Clinician: Referring Adonai Selsor: Treating Genean Adamski/Extender: Alena Bills in Treatment: 24 Encounter Discharge Information Items  Post Procedure Vitals Discharge Condition: Stable Temperature (F): 98.4 Ambulatory Status: Ambulatory Pulse (bpm): 84 Discharge Destination: Home Respiratory Rate (breaths/min): 18 Transportation: Private Auto Blood Pressure (mmHg): 136/68 Accompanied By: self Schedule Follow-up Appointment: Yes Clinical Summary of Care: Patient Declined Electronic Signature(s) Signed: 07/24/2022 3:30:06 PM By: Redmond Pulling RN, BSN Entered By: Redmond Pulling on 07/24/2022 09:08:58 [name] ([id number]) [id number] Page 2 of 9 -------------------------------------------------------------------------------- Lower Extremity Assessment Details Patient Name: Date of Service: [name] 07/24/2022 8:15 A M Medical Record Number: [id number] Patient Account Number: [id number] Date of Birth/Sex: Treating RN: 1965/03/15 (57 y.o. Cline Cools Primary Care Christin Moline: Marrianne Mood Other Clinician: Referring Franklin Clapsaddle: Treating Denim Start/Extender: Alena Bills in Treatment: 24 Edema Assessment Assessed: Kyra Searles: No] Franne Forts: No] Edema: [Left: No] [Right: No] Calf Left: Right: Point of Measurement: From Medial Instep 38 cm 38.5 cm Ankle Left: Right: Point of Measurement: From Medial Instep 27.8 cm 27.5 cm Vascular Assessment Pulses: Dorsalis Pedis Palpable: [Left:Yes] [Right:Yes] Electronic Signature(s) Signed: 07/24/2022 3:30:06 PM By: Redmond Pulling RN, BSN Entered By: Redmond Pulling on 07/24/2022 08:34:04 -------------------------------------------------------------------------------- Multi Wound Chart Details Patient Name: Date of Service: [name] 07/24/2022 8:15 A M Medical Record Number: [id number] Patient Account Number: [id number] Date of Birth/Sex: Treating RN: Mar 31, 1965 (57 y.o. M) Primary Care Evie Crumpler: Marrianne Mood Other Clinician: Referring Jerrilyn Messinger: Treating Joachim Carton/Extender: Alena Bills in Treatment: 24 Vital Signs Height(in): 72 Pulse(bpm): 84 Weight(lbs): 214 Blood Pressure(mmHg): 136/68 Body Mass Index(BMI): 29 Temperature(F): 98.4 Respiratory Rate(breaths/min): 20 [3:Photos:] [N/A:N/A [id number] Page 3 of 9] Right Foot Left Amputation Site - Transmetatarsal N/A Wound Location: Gradually Appeared Hematoma N/A Wounding Event: Diabetic Wound/Ulcer of the Lower Diabetic Wound/Ulcer of the Lower N/A Primary Etiology: Extremity Extremity Lymphedema, Hypertension, Type II Lymphedema, Hypertension, Type II N/A Comorbid History: Diabetes, Neuropathy Diabetes, Neuropathy 05/29/2022 05/29/2022 N/A Date Acquired: 8 8 N/A Weeks of Treatment: Open Open N/A Wound Status: No No N/A Wound  Recurrence: 2.1x2x0.5 0.7x1x0.2 N/A Measurements L x W x D (cm) 3.299 0.55 N/A A (cm) : rea 1.649 0.11 N/A Volume (cm) : -4075.90% 93.00% N/A % Reduction in A rea: -10206.20% 98.60% N/A % Reduction in Volume: Grade 2 Grade 2 N/A Classification: Medium Medium N/A Exudate A mount: Serosanguineous Sanguinous N/A Exudate Type: red, brown red N/A Exudate Color: Thickened Well defined, not attached N/A Wound Margin: Large (67-100%) Large (67-100%) N/A Granulation A mount: Red Red N/A Granulation Quality: Small (1-33%) Small (1-33%) N/A Necrotic A mount: Eschar, Adherent Slough Adherent Slough N/A Necrotic Tissue: Fat Layer (Subcutaneous Tissue): Yes Fat Layer (Subcutaneous Tissue): Yes N/A Exposed Structures: Fascia: No Fascia: No Tendon: No Tendon: No Muscle: No Muscle: No Joint: No Joint: No Bone: No Bone: No Small (1-33%) Medium (34-66%) N/A Epithelialization: Debridement - Excisional Debridement - Excisional N/A Debridement: Pre-procedure Verification/Time Out 08:45 08:45 N/A Taken: Lidocaine 4% Topical Solution Lidocaine 4% Topical Solution N/A Pain Control: Callus, Subcutaneous, Slough Callus, Subcutaneous,  Slough N/A Tissue Debrided: Skin/Subcutaneous Tissue Skin/Subcutaneous Tissue N/A Level: 4.12 0.69 N/A Debridement A (sq cm): rea Curette Curette N/A Instrument: Minimum Minimum N/A Bleeding: Pressure Pressure N/A Hemostasis A chieved: 0 0 N/A Procedural Pain: 0 0 N/A Post Procedural Pain: Procedure was tolerated well Procedure was tolerated well N/A Debridement Treatment Response: 2.1x2x0.5 0.7x1x0.2 N/A Post Debridement Measurements L x W x D (cm) 1.649 0.11 N/A Post Debridement Volume: (cm) Callus: Yes Callus: Yes N/A Periwound Skin Texture: Dry/Scaly: Yes Dry/Scaly: Yes N/A Periwound Skin Moisture: No Abnormalities Noted No Abnormalities Noted N/A Periwound Skin Color: No Abnormality No Abnormality N/A Temperature: N/A Yes N/A Tenderness on Palpation: Debridement Debridement N/A Procedures Performed: Treatment Notes Wound #3 (Foot) Wound Laterality: Right Cleanser Soap and Water Discharge Instruction: May shower and wash wound with dial antibacterial soap and water prior to dressing change. Peri-Wound Care Topical Primary Dressing Maxorb Extra Ag+ Alginate Dressing, 2x2 (in/in) Discharge Instruction: Apply to wound bed as instructed Secondary Dressing ALLEVYN Heel 4 1/2in x 5 1/2in / 10.5cm x 13.5cm Discharge Instruction: Apply over primary dressing as directed. Woven Gauze Sponge, Non-Sterile 4x4 in Discharge Instruction: Apply over primary dressing as directed. Secured With 59M Medipore H Soft Cloth Surgical T ape, 4 x 10 (in/yd) Discharge Instruction: Secure with tape as directed. Compression [place], [place] ([id number]) [id number] Page 4 of 9 Compression Stockings Add-Ons Wound #4 (Amputation Site - Transmetatarsal) Wound Laterality: Left Cleanser Soap and Water Discharge Instruction: May shower and wash wound with dial antibacterial soap and water prior to dressing change. Peri-Wound Care Topical Primary  Dressing Maxorb Extra Ag+ Alginate Dressing, 2x2 (in/in) Discharge Instruction: Apply to wound bed as instructed Secondary Dressing ALLEVYN Heel 4 1/2in x 5 1/2in / 10.5cm x 13.5cm Discharge Instruction: Apply over primary dressing as directed. Woven Gauze Sponge, Non-Sterile 4x4 in Discharge Instruction: Apply over primary dressing as directed. Secured With 59M Medipore H Soft Cloth Surgical T ape, 4 x 10 (in/yd) Discharge Instruction: Secure with tape as directed. Compression Wrap Compression Stockings Add-Ons Electronic Signature(s) Signed: 07/24/2022 9:19:36 AM By: Duanne Guess MD FACS Entered By: Duanne Guess on 07/24/2022 09:19:36 -------------------------------------------------------------------------------- Multi-Disciplinary Care Plan Details Patient Name: Date of Service: [name] 07/24/2022 8:15 A M Medical Record Number: [id number] Patient Account Number: [id number] Date of Birth/Sex: Treating RN: [birth year] (57 y.o. Cline Cools Primary Care Marquelle Balow: Marrianne Mood Other Clinician: Referring Estephany Perot: Treating Jahniya Duzan/Extender: Alena Bills in Treatment: 24 Multidisciplinary Care Plan reviewed with physician Active  Inactive Nutrition Nursing Diagnoses: Impaired glucose control: actual or potential Goals: Patient/caregiver verbalizes understanding of need to maintain therapeutic glucose control per primary care physician Date Initiated: 02/02/2022 Target Resolution Date: 08/31/2022 Goal Status: Active Interventions: Assess patient nutrition upon admission and as needed per policy Provide education on elevated blood sugars and impact on wound healing [place], [name] ([id number]) [id number] Page 5 of 9 Treatment Activities: Dietary management education, guidance and counseling : 02/02/2022 Notes: Wound/Skin Impairment Nursing Diagnoses: Impaired tissue integrity Goals: Patient/caregiver will  verbalize understanding of skin care regimen Date Initiated: 03/06/2022 Target Resolution Date: 08/31/2022 Goal Status: Active Ulcer/skin breakdown will have a volume reduction of 30% by week 4 Date Initiated: 02/02/2022 Date Inactivated: 06/06/2022 Target Resolution Date: 06/27/2022 Unmet Reason: continues to grow Goal Status: Unmet callous Interventions: Assess ulceration(s) every visit Provide education on ulcer and skin care Treatment Activities: Patient referred to home care : 02/02/2022 Skin care regimen initiated : 02/02/2022 Notes: Electronic Signature(s) Signed: 07/24/2022 3:30:06 PM By: Redmond Pulling RN, BSN Entered By: Redmond Pulling on 07/24/2022 08:56:09 -------------------------------------------------------------------------------- Pain Assessment Details Patient Name: Date of Service: [name] 07/24/2022 8:15 A M Medical Record Number: [id number] Patient Account Number: [id number] Date of Birth/Sex: Treating RN: 1965-04-08 (57 y.o. Cline Cools Primary Care Faige Seely: Marrianne Mood Other Clinician: Referring Kendall Arnell: Treating Isabelly Kobler/Extender: Alena Bills in Treatment: 24 Active Problems Location of Pain Severity and Description of Pain Patient Has Paino No Site Locations Pain Management and Medication Current Pain Management: [name], [name] ([id number]) [id number] Page 6 of 9 Electronic Signature(s) Signed: 07/24/2022 3:30:06 PM By: Redmond Pulling RN, BSN Entered By: Redmond Pulling on 07/24/2022 08:32:35 -------------------------------------------------------------------------------- Patient/Caregiver Education Details Patient Name: Date of Service: [name] 6/25/2024andnbsp8:15 A M Medical Record Number: [id number] Patient Account Number: [id number] Date of Birth/Gender: Treating RN: 06/27/1965 (57 y.o. Cline Cools Primary Care Physician: Marrianne Mood Other Clinician: Referring  Physician: Treating Physician/Extender: Alena Bills in Treatment: 24 Education Assessment Education Provided To: Patient Education Topics Provided Wound/Skin Impairment: Methods: Explain/Verbal Responses: State content correctly [name]) Signed: 07/24/2022 3:30:06 PM By: Redmond Pulling RN, BSN Entered By: Redmond Pulling on 07/24/2022 08:56:25 -------------------------------------------------------------------------------- Wound Assessment Details Patient Name: Date of Service: [name] 07/24/2022 8:15 A M Medical Record Number: [id number] Patient Account Number: [id number] Date of Birth/Sex: Treating RN: Jun 14, 1965 (57 y.o. Cline Cools Primary Care Inmer Nix: Marrianne Mood Other Clinician: Referring Areliz Rothman: Treating Tavonte Seybold/Extender: Alena Bills in Treatment: 24 Wound Status Wound Number: 3 Primary Etiology: Diabetic Wound/Ulcer of the Lower Extremity Wound Location: Right Foot Wound Status: Open Wounding Event: Gradually Appeared Comorbid History: Lymphedema, Hypertension, Type II Diabetes, Neuropathy Date Acquired: 05/29/2022 Weeks Of Treatment: 8 Clustered Wound: No Photos [name], [name] ([id number]) [id number] Page 7 of 9 Wound Measurements Length: (cm) 2.1 Width: (cm) 2 Depth: (cm) 0.5 Area: (cm) 3.299 Volume: (cm) 1.649 % Reduction in Area: -4075.9% % Reduction in Volume: -10206.2% Epithelialization: Small (1-33%) Tunneling: No Undermining: No Wound Description Classification: Grade 2 Wound Margin: Thickened Exudate Amount: Medium Exudate Type: Serosanguineous Exudate Color: red, brown Foul Odor After Cleansing: No Slough/Fibrino Yes Wound Bed Granulation Amount: Large (67-100%) Exposed Structure Granulation Quality: Red Fascia Exposed: No Necrotic Amount: Small (1-33%) Fat Layer (Subcutaneous Tissue) Exposed: Yes Necrotic Quality: Eschar,  Adherent Slough Tendon Exposed: No Muscle Exposed: No Joint Exposed: No Bone Exposed: No Periwound Skin Texture Texture Color No Abnormalities Noted: No No Abnormalities Noted: Yes  Callus: Yes Temperature / Pain Temperature: No Abnormality Moisture No Abnormalities Noted: No Dry / Scaly: Yes Treatment Notes Wound #3 (Foot) Wound Laterality: Right Cleanser Soap and Water Discharge Instruction: May shower and wash wound with dial antibacterial soap and water prior to dressing change. Peri-Wound Care Topical Primary Dressing Maxorb Extra Ag+ Alginate Dressing, 2x2 (in/in) Discharge Instruction: Apply to wound bed as instructed Secondary Dressing ALLEVYN Heel 4 1/2in x 5 1/2in / 10.5cm x 13.5cm Discharge Instruction: Apply over primary dressing as directed. Woven Gauze Sponge, Non-Sterile 4x4 in Discharge Instruction: Apply over primary dressing as directed. Secured With 62M Medipore H Soft Cloth Surgical T ape, 4 x 10 (in/yd) Discharge Instruction: Secure with tape as directed. Compression Wrap Compression Stockings [place], [place] ([id number]) [id number] Page 8 of 9 Add-Ons Electronic Signature(s) Signed: 07/24/2022 3:30:06 PM By: Redmond Pulling RN, BSN Entered By: Redmond Pulling on 07/24/2022 08:40:00 -------------------------------------------------------------------------------- Wound Assessment Details Patient Name: Date of Service: [name] 07/24/2022 8:15 A M Medical Record Number: [id number] Patient Account Number: [id number] Date of Birth/Sex: Treating RN: Feb 17, 1965 (57 y.o. Cline Cools Primary Care Tyrica Afzal: Marrianne Mood Other Clinician: Referring Zynasia Burklow: Treating Anjenette Gerbino/Extender: Alena Bills in Treatment: 24 Wound Status Wound Number: 4 Primary Etiology: Diabetic Wound/Ulcer of the Lower Extremity Wound Location: Left Amputation Site - Transmetatarsal Wound Status: Open Wounding Event:  Hematoma Comorbid Lymphedema, Hypertension, Type II Diabetes, Neuropathy History: Date Acquired: 05/29/2022 Weeks Of Treatment: 8 Clustered Wound: No Photos Wound Measurements Length: (cm) Width: (cm) Depth: (cm) Area: (cm) Volume: (cm) 0.7 % Reduction in Area: 93% 1 % Reduction in Volume: 98.6% 0.2 Epithelialization: Medium (34-66%) 0.55 Tunneling: No 0.11 Undermining: No Wound Description Classification: Grade 2 Wound Margin: Well defined, not attached Exudate Amount: Medium Exudate Type: Sanguinous Exudate Color: red Foul Odor After Cleansing: No Slough/Fibrino Yes Wound Bed Granulation Amount: Large (67-100%) Exposed Structure Granulation Quality: Red Fascia Exposed: No Necrotic Amount: Small (1-33%) Fat Layer (Subcutaneous Tissue) Exposed: Yes Necrotic Quality: Adherent Slough Tendon Exposed: No Muscle Exposed: No Joint Exposed: No Bone Exposed: No Periwound Skin Texture Texture Color No Abnormalities Noted: No No Abnormalities Noted: Yes Callus: Yes Temperature / Pain Temperature: No Abnormality Moisture [name], [name] ([id number]) [id number] Page 9 of 9 No Abnormalities Noted: No Tenderness on Palpation: Yes Dry / Scaly: Yes Treatment Notes Wound #4 (Amputation Site - Transmetatarsal) Wound Laterality: Left Cleanser Soap and Water Discharge Instruction: May shower and wash wound with dial antibacterial soap and water prior to dressing change. Peri-Wound Care Topical Primary Dressing Maxorb Extra Ag+ Alginate Dressing, 2x2 (in/in) Discharge Instruction: Apply to wound bed as instructed Secondary Dressing ALLEVYN Heel 4 1/2in x 5 1/2in / 10.5cm x 13.5cm Discharge Instruction: Apply over primary dressing as directed. Woven Gauze Sponge, Non-Sterile 4x4 in Discharge Instruction: Apply over primary dressing as directed. Secured With 62M Medipore H Soft Cloth Surgical T ape, 4 x 10 (in/yd) Discharge Instruction: Secure with tape as  directed. Compression Wrap Compression Stockings Add-Ons Electronic Signature(s) Signed: 07/24/2022 3:30:06 PM By: Redmond Pulling RN, BSN Entered By: Redmond Pulling on 07/24/2022 08:39:28 -------------------------------------------------------------------------------- Vitals Details Patient Name: Date of Service: [name] 07/24/2022 8:15 A M Medical Record Number: [id number] Patient Account Number: [id number] Date of Birth/Sex: Treating RN: 04-12-1965 (57 y.o. Cline Cools Primary Care Kathi Dohn: Marrianne Mood Other Clinician: Referring Malinda Mayden: Treating Kobie Matkins/Extender: Alena Bills in Treatment: 24 Vital Signs Time Taken: 08:29 Temperature (F): 98.4 Height (in): 72 Pulse (bpm):  84 Weight (lbs): 214 Respiratory Rate (breaths/min): 20 Body Mass Index (BMI): 29 Blood Pressure (mmHg): 136/68 Reference Range: 80 - 120 mg / dl Electronic Signature(s) Signed: 07/24/2022 3:30:06 PM By: Redmond Pulling RN, BSN Entered By: Redmond Pulling on 07/24/2022 08:30:12

## 2022-07-27 ENCOUNTER — Ambulatory Visit (INDEPENDENT_AMBULATORY_CARE_PROVIDER_SITE_OTHER): Payer: Medicaid Other | Admitting: Student

## 2022-07-27 ENCOUNTER — Other Ambulatory Visit (HOSPITAL_COMMUNITY): Payer: Self-pay

## 2022-07-27 ENCOUNTER — Other Ambulatory Visit: Payer: Self-pay

## 2022-07-27 ENCOUNTER — Telehealth: Payer: Self-pay | Admitting: *Deleted

## 2022-07-27 VITALS — BP 117/59 | HR 84 | Temp 98.3°F | Resp 28 | Ht 72.0 in | Wt 230.4 lb

## 2022-07-27 DIAGNOSIS — I10 Essential (primary) hypertension: Secondary | ICD-10-CM | POA: Diagnosis not present

## 2022-07-27 DIAGNOSIS — M7989 Other specified soft tissue disorders: Secondary | ICD-10-CM | POA: Diagnosis not present

## 2022-07-27 MED ORDER — HYDROCHLOROTHIAZIDE 12.5 MG PO CAPS
12.5000 mg | ORAL_CAPSULE | Freq: Every day | ORAL | 3 refills | Status: DC
  Filled 2022-07-27: qty 90, 90d supply, fill #0

## 2022-07-27 NOTE — Progress Notes (Signed)
CC: Lower extremity swelling   HPI:  [name] is a 57 y.o. male living with a history stated below and presents today for lower extremity swelling. Please see problem based assessment and plan for additional details.  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 300 each 3   Amino Acids-Protein Hydrolys (PRO-STAT AWC) LIQD Take 1 packet by mouth 3 (three) times daily after meals. 90 mL 2   atorvastatin (LIPITOR) 40 MG tablet Take 1 tablet (40 mg total) by mouth daily. 90 tablet 3   Blood Glucose Monitoring Suppl (ACCU-CHEK GUIDE) w/Device KIT Check blood sugar level in the morning before first meal or drink of the day, and before each meal. 1 kit 0   Continuous Glucose Sensor (FREESTYLE LIBRE 3 SENSOR) MISC Place 1 sensor on the skin every 14 days. Use to check glucose continuously 2 each 11   doxycycline (VIBRA-TABS) 100 MG tablet Take 1 tablet (100 mg total) by mouth 2 (two) times daily. 90 tablet 0   Dulaglutide (TRULICITY) 3 MG/0.5ML SOPN Inject 3 mg as directed once a week. 2 mL 1   Elastic Bandages & Supports (MEDICAL COMPRESSION STOCKINGS) MISC 1 each by Does not apply route daily. 2 each 0   empagliflozin (JARDIANCE) 10 MG TABS tablet Take 1 tablet (10 mg total) by mouth daily before breakfast. 30 tablet 2   glucose blood (ACCU-CHEK GUIDE) test strip Check blood sugar 4 (four) times daily. 350 each 3   insulin degludec (TRESIBA) 100 UNIT/ML FlexTouch Pen Inject 74 Units into the skin at bedtime. 21 mL 2   insulin lispro (HUMALOG) 100 UNIT/ML injection Inject 0.08 mLs (8 Units total) into  the skin 3 (three) times daily with meals. 10 mL 3   Insulin Pen Needle 31G X 5 MM MISC Use as directed 100 each 11   levofloxacin (LEVAQUIN) 750 MG tablet Take 1 tablet (750 mg total) by mouth daily for 10 days 10 tablet 0   metFORMIN (GLUCOPHAGE) 1000 MG tablet Take 1 tablet (1,000 mg total) by mouth 2 (two) times daily with a meal. 180 tablet 3   metroNIDAZOLE (FLAGYL) 500 MG tablet Take 1 tablet (500 mg total) by mouth 3 (three) times daily for 10 days 30 tablet 0   mupirocin ointment (BACTROBAN) 2 % Apply topically daily to wound on foot 22 g 1   mupirocin ointment (BACTROBAN) 2 % Apply to wounds on feet daily. 22 g 0   olmesartan (BENICAR) 20 MG tablet Take 1 tablet (20 mg total) by mouth daily. 30 tablet 3   sulfamethoxazole-trimethoprim (BACTRIM DS) 800-160 MG tablet Take 1 tablet by mouth 2 (two) times daily for 10 days 20 tablet 0   No current facility-administered medications on file prior to visit.    Family History  Problem Relation Age of Onset   Colon cancer Neg Hx    Colon polyps Neg Hx    Esophageal cancer Neg Hx    Stomach cancer Neg Hx    Rectal cancer Neg Hx     Social History   Socioeconomic  History   Marital status: Married    Spouse name: Not on file   Number of children: Not on file   Years of education: 12   Highest education level: Not on file  Occupational History    Comment: employed at [employer]  Tobacco Use   Smoking status: Never   Smokeless tobacco: Never  Substance and Sexual Activity   Alcohol use: No    Alcohol/week: 0.0 standard drinks of alcohol   Drug use: No   Sexual activity: Not on file    Comment: immigrated from [place], speaks arabic, married  Other Topics Concern   Not on file  Social History Narrative   Son and wife are supportive, he usually works form 5 PM - 2 AM   Social Determinants of Corporate investment banker Strain: Not on file  Food Insecurity: No Food Insecurity (02/08/2022)   Hunger Vital Sign    Worried About  Running Out of Food in the Last Year: Never true    Ran Out of Food in the Last Year: Never true  Transportation Needs: No Transportation Needs (02/08/2022)   PRAPARE - Administrator, Civil Service (Medical): No    Lack of Transportation (Non-Medical): No  Physical Activity: Not on file  Stress: Not on file  Social Connections: Socially Integrated (02/08/2022)   Social Connection and Isolation Panel [NHANES]    Frequency of Communication with Friends and Family: More than three times a week    Frequency of Social Gatherings with Friends and Family: More than three times a week    Attends Religious Services: More than 4 times per year    Active Member of [employer] or Organizations: No    Attends Engineer, structural: More than 4 times per year    Marital Status: Married  Catering manager Violence: Not At Risk (02/08/2022)   Humiliation, Afraid, Rape, and Kick questionnaire    Fear of Current or Ex-Partner: No    Emotionally Abused: No    Physically Abused: No    Sexually Abused: No    Review of Systems: ROS negative except for what is noted on the assessment and plan.  Vitals:   07/27/22 1050  BP: (!) 117/59  Pulse: 84  Resp: (!) 28  Temp: 98.3 F (36.8 C)  TempSrc: Oral  SpO2: 99%  Weight: 230 lb 6.4 oz (104.5 kg)  Height: 6' (1.829 m)    Physical Exam: Constitutional: well-appearing male  in no acute distress Cardiovascular: regular rate and rhythm, no m/r/g Pulmonary/Chest: normal work of breathing on room air, lungs clear to auscultation bilaterally Abdominal: soft, non-tender, non-distended Skin: +1 pitting edema of lower extremities, dry skin   Assessment & Plan:   Swelling of both lower extremities Pt presents for an acute visit for lower extremity swelling. He states he noticed it two days ago. He does wear compression stockings, however stopped wearing them four days ago as he had forgotten about them. He states this has been going on for quite  sometime, which is why he started to wear the compression stockings. He denies any pain in his legs, shortness of breath, recent fevers, chills, nausea, or vomiting. He is on amlodipine 5mg , which he has been taking for quite some time, and given chronicity of lower extremity swelling, this could be related. Will stop amlodipine, and switch to a different anti-hypertensive for him given multiple comorbidities.   Plan:  - Stop amlodipine  - Encouraged continued use of compression stockings  and leg elevation  Essential hypertension Stopped amlodipine today (see discussion over lower extremity swelling problem), and will switch for hydrochlorothiazide 12.5mg . Will recheck BMP in one month, but blood pressure done today within normal limits.   Plan:  - Stop amlodipine  - Start hydrochlorothiazide 12.5mg   - Continue Olmesartan 20mg  - Can consider combination ARB-thiazide at next visit  Patient discussed with Dr. Maryagnes Amos Aubrea Meixner, M.D. [facility], PGY-1 Pager: [phone] Date 07/27/2022 Time 2:26 PM

## 2022-07-27 NOTE — Assessment & Plan Note (Signed)
Pt presents for an acute visit for lower extremity swelling. He states he noticed it two days ago. He does wear compression stockings, however stopped wearing them four days ago as he had forgotten about them. He states this has been going on for quite sometime, which is why he started to wear the compression stockings. He denies any pain in his legs, shortness of breath, recent fevers, chills, nausea, or vomiting. He is on amlodipine 5mg , which he has been taking for quite some time, and given chronicity of lower extremity swelling, this could be related. Will stop amlodipine, and switch to a different anti-hypertensive for him given multiple comorbidities.   Plan:  - Stop amlodipine  - Encouraged continued use of compression stockings and leg elevation

## 2022-07-27 NOTE — Patient Instructions (Addendum)
Thank you so much for coming to the clinic today!   We are changing your amlodipine to a different blood pressure medication called hydrochlorothiazide. We want to see you back in a month. Please continue to wear the compression stockings, and elevate your legs.   If you have any questions please feel free to the call the clinic at anytime at [phone]. It was a pleasure seeing you!  Best, Dr. Thomasene Ripple

## 2022-07-27 NOTE — Telephone Encounter (Signed)
Patient walked in with c/o bilateral leg swelling lower extremities for 2 days.  No shortness of breath.  Spoke with Dr. Oswaldo Done the Attending.  Patient was given an appointment to be seen this morning.

## 2022-07-27 NOTE — Assessment & Plan Note (Signed)
Stopped amlodipine today (see discussion over lower extremity swelling problem), and will switch for hydrochlorothiazide 12.5mg . Will recheck BMP in one month, but blood pressure done today within normal limits.   Plan:  - Stop amlodipine  - Start hydrochlorothiazide 12.5mg   - Continue Olmesartan 20mg  - Can consider combination ARB-thiazide at next visit

## 2022-07-30 NOTE — Progress Notes (Signed)
Internal Medicine Clinic Attending  Case discussed with Dr. Nooruddin  At the time of the visit.  We reviewed the resident's history and exam and pertinent patient test results.  I agree with the assessment, diagnosis, and plan of care documented in the resident's note.  

## 2022-07-31 ENCOUNTER — Ambulatory Visit (HOSPITAL_BASED_OUTPATIENT_CLINIC_OR_DEPARTMENT_OTHER): Payer: Medicaid Other | Admitting: General Surgery

## 2022-08-07 ENCOUNTER — Encounter (HOSPITAL_BASED_OUTPATIENT_CLINIC_OR_DEPARTMENT_OTHER): Payer: Medicaid Other | Attending: General Surgery | Admitting: General Surgery

## 2022-08-07 DIAGNOSIS — L97522 Non-pressure chronic ulcer of other part of left foot with fat layer exposed: Secondary | ICD-10-CM | POA: Diagnosis not present

## 2022-08-07 DIAGNOSIS — E11621 Type 2 diabetes mellitus with foot ulcer: Secondary | ICD-10-CM | POA: Insufficient documentation

## 2022-08-07 DIAGNOSIS — I129 Hypertensive chronic kidney disease with stage 1 through stage 4 chronic kidney disease, or unspecified chronic kidney disease: Secondary | ICD-10-CM | POA: Diagnosis not present

## 2022-08-07 DIAGNOSIS — E1122 Type 2 diabetes mellitus with diabetic chronic kidney disease: Secondary | ICD-10-CM | POA: Diagnosis not present

## 2022-08-07 DIAGNOSIS — N183 Chronic kidney disease, stage 3 unspecified: Secondary | ICD-10-CM | POA: Diagnosis not present

## 2022-08-07 DIAGNOSIS — E1140 Type 2 diabetes mellitus with diabetic neuropathy, unspecified: Secondary | ICD-10-CM | POA: Insufficient documentation

## 2022-08-07 DIAGNOSIS — L97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 without evidence of necrosis: Secondary | ICD-10-CM | POA: Diagnosis not present

## 2022-08-07 NOTE — Progress Notes (Signed)
Brentwood, [name] ([id number]) [id number] Page 1 of 9 Visit Report for 08/07/2022 Arrival Information Details Patient Name: Date of Service: [name] 08/07/2022 9:15 A M Medical Record Number: [id number] Patient Account Number: [id number] Date of Birth/Sex: Treating RN: March 24, 1965 (57 y.o. M) Primary Care Marion Seese: Marrianne Mood Other Clinician: Referring Damon Baisch: Treating Kasey Hansell/Extender: [name] in Treatment: 26 Visit Information History Since Last Visit All ordered tests and consults were completed: No Patient Arrived: Ambulatory Added or deleted any medications: No Arrival Time: 09:25 Any new allergies or adverse reactions: No Accompanied By: son Had a fall or experienced change in No Transfer Assistance: None activities of daily living that may affect Patient Identification Verified: Yes risk of falls: Secondary Verification Process Completed: Yes Signs or symptoms of abuse/neglect since last visito No Patient Requires Transmission-Based Precautions: No Hospitalized since last visit: No Patient Has Alerts: No Implantable device outside of the clinic excluding No cellular tissue based products placed in the center since last visit: Pain Present Now: No Electronic Signature(s) Signed: 08/07/2022 11:39:16 AM By: [name] Entered By: [name] on 08/07/2022 09:26:15 -------------------------------------------------------------------------------- Encounter Discharge Information Details Patient Name: Date of Service: [name] 08/07/2022 9:15 A M Medical Record Number: [id number] Patient Account Number: [id number] Date of Birth/Sex: Treating RN: 10-Sep-1965 (57 y.o. [name] Primary Care Amyjo Mizrachi: Marrianne Mood Other Clinician: Referring Nas Wafer: Treating Elisavet Buehrer/Extender: [name] in Treatment: 26 Encounter Discharge Information Items Post Procedure  Vitals Discharge Condition: Stable Temperature (F): 98.4 Ambulatory Status: Ambulatory Pulse (bpm): 80 Discharge Destination: Home Respiratory Rate (breaths/min): 20 Transportation: Private Auto Blood Pressure (mmHg): 148/80 Accompanied By: son Schedule Follow-up Appointment: Yes Clinical Summary of Care: Patient Declined Electronic Signature(s) Signed: 08/07/2022 3:45:14 PM By: [name] Entered By: [name] on 08/07/2022 09:51:17 Causey, [name] ([id number]) [id number] Page 2 of 9 -------------------------------------------------------------------------------- Lower Extremity Assessment Details Patient Name: Date of Service: [name] 08/07/2022 9:15 A M Medical Record Number: [id number] Patient Account Number: [id number] Date of Birth/Sex: Treating RN: August 21, 1965 (57 y.o. [name] Primary Care Reianna Batdorf: Marrianne Mood Other Clinician: Referring Beryl Balz: Treating Cebastian Neis/Extender: [name] in Treatment: 26 Edema Assessment Assessed: [name]: No] Franne Forts: No] Edema: [Left: No] [Right: No] Calf Left: Right: Point of Measurement: From Medial Instep 38 cm 38.5 cm Ankle Left: Right: Point of Measurement: From Medial Instep 27.8 cm 27.5 cm Electronic Signature(s) Signed: 08/07/2022 3:45:14 PM By: [name] Entered By: [name] on 08/07/2022 09:30:00 -------------------------------------------------------------------------------- Multi Wound Chart Details Patient Name: Date of Service: [name] 08/07/2022 9:15 A M Medical Record Number: [id number] Patient Account Number: [id number] Date of Birth/Sex: Treating RN: 01-Dec-1965 (57 y.o. M) Primary Care Opaline Reyburn: Marrianne Mood Other Clinician: Referring Gabryela Kimbrell: Treating Advaith Lamarque/Extender: [name] in Treatment: 26 Vital Signs Height(in): 72 Pulse(bpm): 80 Weight(lbs): 214 Blood  Pressure(mmHg): 148/80 Body Mass Index(BMI): 29 Temperature(F): 98.4 Respiratory Rate(breaths/min): 20 [3:Photos:] [N/A:N/A] Right Foot Left Amputation Site - Transmetatarsal N/A Wound Location: Gradually Appeared Hematoma N/A Wounding Event: Diabetic Wound/Ulcer of the Lower Diabetic Wound/Ulcer of the Lower N/A Primary Etiology: Extremity Extremity Lymphedema, Hypertension, Type II Lymphedema, Hypertension, Type II N/A Comorbid History: Diabetes, Neuropathy Diabetes, Neuropathy Caruth, [name] ([id number]) [id number] Page 3 of 9 05/29/2022 05/29/2022 N/A Date Acquired: 10 10 N/A Weeks of Treatment: Open Open N/A Wound Status: No No N/A Wound Recurrence: 1.1x0.6x0.4 0.4x0.5x0.2 N/A Measurements L x W x D (cm) 0.518  0.157 N/A A (cm) : rea 0.207 0.031 N/A Volume (cm) : -555.70% 98.00% N/A % Reduction in A rea: -1193.70% 99.60% N/A % Reduction in Volume: Grade 2 Grade 2 N/A Classification: Medium Medium N/A Exudate A mount: Serosanguineous Sanguinous N/A Exudate Type: red, brown red N/A Exudate Color: Thickened Well defined, not attached N/A Wound Margin: Medium (34-66%) Large (67-100%) N/A Granulation A mount: Red, Pink Red N/A Granulation Quality: Medium (34-66%) Small (1-33%) N/A Necrotic A mount: Eschar, Adherent Slough Adherent Slough N/A Necrotic Tissue: Fat Layer (Subcutaneous Tissue): Yes Fat Layer (Subcutaneous Tissue): Yes N/A Exposed Structures: Fascia: No Fascia: No Tendon: No Tendon: No Muscle: No Muscle: No Joint: No Joint: No Bone: No Bone: No Small (1-33%) Medium (34-66%) N/A Epithelialization: Debridement - Excisional Debridement - Excisional N/A Debridement: Pre-procedure Verification/Time Out 09:36 09:36 N/A Taken: Lidocaine 4% Topical Solution Lidocaine 4% Topical Solution N/A Pain Control: Callus, Subcutaneous, Slough Callus, Subcutaneous, Slough N/A Tissue Debrided: Skin/Subcutaneous Tissue  Skin/Subcutaneous Tissue N/A Level: 0.52 0.16 N/A Debridement A (sq cm): rea Curette Curette N/A Instrument: Minimum Minimum N/A Bleeding: Pressure Pressure N/A Hemostasis A chieved: Procedure was tolerated well Procedure was tolerated well N/A Debridement Treatment Response: 1.1x0.6x0.4 0.4x0.5x0.2 N/A Post Debridement Measurements L x W x D (cm) 0.207 0.031 N/A Post Debridement Volume: (cm) Callus: Yes Callus: Yes N/A Periwound Skin Texture: Dry/Scaly: Yes Dry/Scaly: Yes N/A Periwound Skin Moisture: No Abnormalities Noted No Abnormalities Noted N/A Periwound Skin Color: No Abnormality No Abnormality N/A Temperature: Debridement Debridement N/A Procedures Performed: Treatment Notes Electronic Signature(s) Signed: 08/07/2022 9:40:34 AM By: Duanne Guess MD FACS Entered By: Duanne Guess on 08/07/2022 09:40:34 -------------------------------------------------------------------------------- Multi-Disciplinary Care Plan Details Patient Name: Date of Service: [name] 08/07/2022 9:15 A M Medical Record Number: [id number] Patient Account Number: [id number] Date of Birth/Sex: Treating RN: 02-Mar-1965 (57 y.o. M) Primary Care Nelson Julson: Marrianne Mood Other Clinician: Referring Martha Ellerby: Treating Delmer Kowalski/Extender: [name] in Treatment: 26 Multidisciplinary Care Plan reviewed with physician Active Inactive Nutrition Nursing Diagnoses: Impaired glucose control: actual or potential GoalsFABIO, WAH ([id number]) [id number] Page 4 of 9 Patient/caregiver verbalizes understanding of need to maintain therapeutic glucose control per primary care physician Date Initiated: 02/02/2022 Target Resolution Date: 08/31/2022 Goal Status: Active Interventions: Assess patient nutrition upon admission and as needed per policy Provide education on elevated blood sugars and impact on wound healing Treatment  Activities: Dietary management education, guidance and counseling : 02/02/2022 Notes: Wound/Skin Impairment Nursing Diagnoses: Impaired tissue integrity Goals: Patient/caregiver will verbalize understanding of skin care regimen Date Initiated: 03/06/2022 Target Resolution Date: 08/31/2022 Goal Status: Active Ulcer/skin breakdown will have a volume reduction of 30% by week 4 Date Initiated: 02/02/2022 Date Inactivated: 06/06/2022 Target Resolution Date: 06/27/2022 Unmet Reason: continues to grow Goal Status: Unmet callous Interventions: Assess ulceration(s) every visit Provide education on ulcer and skin care Treatment Activities: Patient referred to home care : 02/02/2022 Skin care regimen initiated : 02/02/2022 Notes: Electronic Signature(s) Signed: 08/07/2022 11:39:16 AM By: [name] Entered By: [name] on 08/07/2022 09:38:49 -------------------------------------------------------------------------------- Pain Assessment Details Patient Name: Date of Service: [name] 08/07/2022 9:15 A M Medical Record Number: [id number] Patient Account Number: [id number] Date of Birth/Sex: Treating RN: 02/13/1965 (57 y.o. M) Primary Care Anishka Bushard: Marrianne Mood Other Clinician: Referring Reeva Davern: Treating Autry Droege/Extender: [name] in Treatment: 26 Active Problems Location of Pain Severity and Description of Pain Patient Has Paino No Site Locations [place], [place] ([id number]) [id number] Page 5 of 9 Pain  Management and Medication Current Pain Management: Electronic Signature(s) Signed: 08/07/2022 11:39:16 AM By: [name] Entered By: [name] on 08/07/2022 09:26:42 -------------------------------------------------------------------------------- Patient/Caregiver Education Details Patient Name: Date of Service: [name] 7/9/2024andnbsp9:15 A M Medical Record Number: [id number] Patient Account Number: [id number] Date  of Birth/Gender: Treating RN: 08/19/1965 (57 y.o. M) Primary Care Physician: Marrianne Mood Other Clinician: Referring Physician: Treating Physician/Extender: [name] in Treatment: 26 Education Assessment Education Provided To: Patient Education Topics Provided Wound/Skin Impairment: Methods: Explain/Verbal Responses: Reinforcements needed, State content correctly [employer]) Signed: 08/07/2022 11:39:16 AM By: [name] Entered By: [name] on 08/07/2022 09:39:00 -------------------------------------------------------------------------------- Wound Assessment Details Patient Name: Date of Service: [name] 08/07/2022 9:15 A M Medical Record Number: [id number] Patient Account Number: [id number] Date of Birth/Sex: Treating RN: 1966/01/12 (57 y.o. M) Primary Care Mavrik Bynum: Marrianne Mood Other Clinician: Referring Sevin Langenbach: Treating Ndidi Nesby/Extender: [name], [name] ([id number]) [id number] Page 6 of 9 Weeks in Treatment: 26 Wound Status Wound Number: 3 Primary Etiology: Diabetic Wound/Ulcer of the Lower Extremity Wound Location: Right Foot Wound Status: Open Wounding Event: Gradually Appeared Comorbid History: Lymphedema, Hypertension, Type II Diabetes, Neuropathy Date Acquired: 05/29/2022 Weeks Of Treatment: 10 Clustered Wound: No Photos Wound Measurements Length: (cm) 1.1 Width: (cm) 0.6 Depth: (cm) 0.4 Area: (cm) 0.518 Volume: (cm) 0.207 % Reduction in Area: -555.7% % Reduction in Volume: -1193.7% Epithelialization: Small (1-33%) Tunneling: No Undermining: No Wound Description Classification: Grade 2 Wound Margin: Thickened Exudate Amount: Medium Exudate Type: Serosanguineous Exudate Color: red, brown Foul Odor After Cleansing: No Slough/Fibrino Yes Wound Bed Granulation Amount: Medium (34-66%) Exposed Structure Granulation Quality: Red,  Pink Fascia Exposed: No Necrotic Amount: Medium (34-66%) Fat Layer (Subcutaneous Tissue) Exposed: Yes Necrotic Quality: Eschar, Adherent Slough Tendon Exposed: No Muscle Exposed: No Joint Exposed: No Bone Exposed: No Periwound Skin Texture Texture Color No Abnormalities Noted: No No Abnormalities Noted: Yes Callus: Yes Temperature / Pain Temperature: No Abnormality Moisture No Abnormalities Noted: No Dry / Scaly: Yes Treatment Notes Wound #3 (Foot) Wound Laterality: Right Cleanser Soap and Water Discharge Instruction: May shower and wash wound with dial antibacterial soap and water prior to dressing change. Peri-Wound Care Topical Primary Dressing Maxorb Extra Ag+ Alginate Dressing, 2x2 (in/in) Discharge Instruction: Apply to wound bed as instructed Secondary Dressing ALLEVYN Heel 4 1/2in x 5 1/2in / 10.5cm x 13.5cm Discharge Instruction: Apply over primary dressing as directed. [name], [name] ([id number]) [id number] Page 7 of 9 Woven Gauze Sponge, Non-Sterile 4x4 in Discharge Instruction: Apply over primary dressing as directed. Secured With 49M Medipore H Soft Cloth Surgical T ape, 4 x 10 (in/yd) Discharge Instruction: Secure with tape as directed. Compression Wrap Compression Stockings Add-Ons Electronic Signature(s) Signed: 08/07/2022 3:45:14 PM By: [name] Entered By: [name] on 08/07/2022 09:31:21 -------------------------------------------------------------------------------- Wound Assessment Details Patient Name: Date of Service: [name] 08/07/2022 9:15 A M Medical Record Number: [id number] Patient Account Number: [id number] Date of Birth/Sex: Treating RN: [birth year] (57 y.o. M) Primary Care Rudra Hobbins: Marrianne Mood Other Clinician: Referring Gill Delrossi: Treating Maxximus Gotay/Extender: [name] in Treatment: 26 Wound Status Wound Number: 4 Primary Etiology: Diabetic Wound/Ulcer  of the Lower Extremity Wound Location: Left Amputation Site - Transmetatarsal Wound Status: Open Wounding Event: Hematoma Comorbid Lymphedema, Hypertension, Type II Diabetes, Neuropathy History: Date Acquired: 05/29/2022 Weeks Of Treatment: 10 Clustered Wound: No Photos Wound Measurements Length: (cm) 0.4 Width: (cm) 0.5 Depth: (cm) 0.2 Area: (cm) 0.157 Volume: (  cm) 0.031 % Reduction in Area: 98% % Reduction in Volume: 99.6% Epithelialization: Medium (34-66%) Tunneling: No Undermining: No Wound Description Classification: Grade 2 Wound Margin: Well defined, not attached Exudate Amount: Medium Exudate Type: Sanguinous Exudate Color: red Foul Odor After Cleansing: No Slough/Fibrino Yes Wound Bed Granulation Amount: Large (67-100%) Exposed Structure Granulation Quality: Red Fascia Exposed: No Necrotic Amount: Small (1-33%) Fat Layer (Subcutaneous Tissue) Exposed: Yes [name] ([id number]) [id number] Page 8 of 9 Necrotic Quality: Adherent Slough Tendon Exposed: No Muscle Exposed: No Joint Exposed: No Bone Exposed: No Periwound Skin Texture Texture Color No Abnormalities Noted: No No Abnormalities Noted: Yes Callus: Yes Temperature / Pain Temperature: No Abnormality Moisture No Abnormalities Noted: No Dry / Scaly: Yes Treatment Notes Wound #4 (Amputation Site - Transmetatarsal) Wound Laterality: Left Cleanser Soap and Water Discharge Instruction: May shower and wash wound with dial antibacterial soap and water prior to dressing change. Peri-Wound Care Topical Primary Dressing Maxorb Extra Ag+ Alginate Dressing, 2x2 (in/in) Discharge Instruction: Apply to wound bed as instructed Secondary Dressing ALLEVYN Heel 4 1/2in x 5 1/2in / 10.5cm x 13.5cm Discharge Instruction: Apply over primary dressing as directed. Woven Gauze Sponge, Non-Sterile 4x4 in Discharge Instruction: Apply over primary dressing as directed. Secured With 85M  Medipore H Soft Cloth Surgical T ape, 4 x 10 (in/yd) Discharge Instruction: Secure with tape as directed. Compression Wrap Compression Stockings Add-Ons Electronic Signature(s) Signed: 08/07/2022 3:45:14 PM By: [name] Entered By: [name] on 08/07/2022 09:32:03 -------------------------------------------------------------------------------- Vitals Details Patient Name: Date of Service: [name] 08/07/2022 9:15 A M Medical Record Number: [id number] Patient Account Number: [id number] Date of Birth/Sex: Treating RN: 12-Sep-1965 (58 y.o. M) Primary Care Harvie Morua: Marrianne Mood Other Clinician: Referring Mahonri Seiden: Treating Juanluis Guastella/Extender: [name] in Treatment: 26 Vital Signs Time Taken: 09:26 Temperature (F): 98.4 Height (in): 72 Pulse (bpm): 80 Weight (lbs): 214 Respiratory Rate (breaths/min): 20 Body Mass Index (BMI): 29 Blood Pressure (mmHg): 148/80 Reference Range: 80 - 120 mg / dl [name], [name] ([id number]) [id number] Page 9 of 9 Electronic Signature(s) Signed: 08/07/2022 11:39:16 AM By: [name] Entered By: [name] on 08/07/2022 09:26:36

## 2022-08-07 NOTE — Progress Notes (Signed)
[place], [name] ([id number]) [id number] Page 1 of 10 Visit Report for 08/07/2022 Chief Complaint Document Details Patient Name: Date of Service: [name] 08/07/2022 9:15 A M Medical Record Number: [id number] Patient Account Number: [id number] Date of Birth/Sex: Treating RN: [birth year] (57 y.o. 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 M) Primary Care Provider: Marrianne Mood Other Clinician: Referring Provider: Treating Provider/Extender: Alena Bills in Treatment: 26 Debridement Performed for Assessment: Wound #3 Right Foot Performed By: Physician Duanne Guess, MD Debridement Type: Debridement Severity of Tissue Pre Debridement: Fat layer exposed Level of Consciousness (Pre-procedure): Awake and Alert Pre-procedure Verification/Time Out Yes - 09:36 Taken: Start Time: 09:36 Pain Control: Lidocaine 4% Topical Solution Percent of Wound Bed Debrided: 100% T Area Debrided (cm): otal 0.52 Tissue and other material debrided: Non-Viable, Callus, Slough, Subcutaneous, Skin: Epidermis, Slough Level: Skin/Subcutaneous Tissue Debridement  Description: Excisional Instrument: Curette Bleeding: Minimum Hemostasis Achieved: Pressure Response to Treatment: Procedure was tolerated well Level of Consciousness (Post- Awake and Alert procedure): Post Debridement Measurements of Total Wound Length: (cm) 1.1 Width: (cm) 0.6 Depth: (cm) 0.4 Volume: (cm) 0.207 Character of Wound/Ulcer Post Debridement: Improved Severity of Tissue Post Debridement: Fat layer exposed [name], [name] ([id number]) [id number] Page 2 of 10 Post Procedure Diagnosis Same as Pre-procedure Notes scribed for Dr. Lady Gary by [name], RN Electronic Signature(s) Signed: 08/07/2022 10:02:06 AM By: Duanne Guess MD FACS Signed: 08/07/2022 11:39:16 AM By: Dayton Scrape Entered By: Dayton Scrape on 08/07/2022 09:38:14 -------------------------------------------------------------------------------- Debridement Details Patient Name: Date of Service: [name] 08/07/2022 9:15 A M Medical Record Number: [id number] Patient Account Number: [id number] Date of Birth/Sex: Treating RN: 1965/10/15 (57 y.o. M) Primary Care Provider: Marrianne Mood Other Clinician: Referring Provider: Treating Provider/Extender: Alena Bills in Treatment: 26 Debridement Performed for Assessment: Wound #4 Left Amputation Site - Transmetatarsal Performed By: Physician Duanne Guess, MD Debridement Type: Debridement Severity of Tissue Pre Debridement: Fat layer exposed Level of Consciousness (Pre-procedure): Awake and Alert Pre-procedure Verification/Time Out Yes - 09:36 Taken: Start Time: 09:36 Pain Control: Lidocaine 4% Topical Solution Percent of Wound Bed Debrided: 100% T Area Debrided (cm): otal 0.16 Tissue and other material debrided: Non-Viable, Callus, Slough, Subcutaneous, Skin: Epidermis, Slough Level: Skin/Subcutaneous Tissue Debridement Description: Excisional Instrument: Curette Bleeding:  Minimum Hemostasis Achieved: Pressure Response to Treatment: Procedure was tolerated well Level of Consciousness (Post- Awake and Alert procedure): Post Debridement Measurements of Total Wound Length: (cm) 0.4 Width: (cm) 0.5 Depth: (cm) 0.2 Volume: (cm) 0.031 Character of Wound/Ulcer Post Debridement: Improved Severity of Tissue Post Debridement: Fat layer exposed Post Procedure Diagnosis Same as Pre-procedure Notes scribed for Dr. Lady Gary by [name], RN Electronic Signature(s) Signed: 08/07/2022 10:02:06 AM By: Duanne Guess MD FACS Signed: 08/07/2022 11:39:16 AM By: Dayton Scrape Entered By: Dayton Scrape on 08/07/2022 09:38:41 [name], [name] ([id number]) [id number] Page 3 of 10 -------------------------------------------------------------------------------- HPI Details Patient Name: Date of Service:  O SMA Samuel [place] MA Hernandez 08/07/2022 9:15 A M Medical Record Number: [id number] Patient Account Number: [id number] Date of Birth/Sex: Treating RN: 16-Aug-1965 (57 y.o. M) Primary Care Provider: Marrianne Mood Other Clinician: Referring Provider: Treating Provider/Extender: Alena Bills in Treatment: 26 History of Present Illness HPI Description: ADMISSION 02/02/2022 This is a 57 year old [place] man who speaks only Arabic. The visit today was conducted with the assistance of the language line interpreter; the patient declines the use of an in person interpreter. He is a poorly controlled diabetic (last hemoglobin A1c 10.8, but it has been as high as 15 in the past) with CKD stage III and hypertension. 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 He required revision of the right TMA in October. This was done in the [facility]. It is not entirely clear how he was referred to our clinic however he has openings on the distal portion of the  right foot as well as drainage coming from the plantar aspect of his left foot underneath some callus. The patient has just been applying dry gauze to both areas. 02/09/2022: The left-sided wound is quite a bit smaller today, but is covered with callus. The right sided wound has also contracted a little bit and is very clean, but there is also callus accumulation around the perimeter. Unfortunately, due to his Medicaid status, we have been unable to secure home health assistance and he has been changing the dressings on his own. He also has difficulty obtaining dressing supplies. 02/19/2022: Both wounds are smaller, particularly on the right. The left is covered with a layer of callus. No concern for infection. 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 There is light slough on the surface with some surrounding callus. The left plantar foot wound has callused over. Underneath the callus, the there is a small opening in the skin. 03/06/2022: The wounds are about the same size this week. There is callus accumulation around each, but the surfaces are cleaner. 03/19/2022: The wound on the right is quite a bit smaller this week. There is minimal callus accumulation and light slough on the surface. The wound on the left is almost completely covered in callus. 03/26/2022: The wound on the right continues to contract and is narrower again this week. The wound on the left has become covered in callus once again. 04/24/2022: In the month since he was last seen, apparently the patient developed some purulent drainage from his foot and was seen by podiatry at [facility]. He was debrided and given antibiotics. Subsequently, his wounds actually look extremely good today. There is callus covering the plantar ulcer on the left. The right TMA site is quite a bit narrower and clean with just some callus and slough accumulation. 05/01/2022: The wound on the plantar surface of his left foot is closed  under a thick layer of callus. On the right, the wound is substantially smaller with just a little bit of slough but also heavy buildup of callus around the wound opening. 05/08/2022: The left plantar foot ulcer remains closed. On the right, the wound continues to narrow significantly. There is still some depth to the wound, but it is clean. 05/22/2022: His wounds are closed. 05/29/2022: The wound on his right foot has reopened. This appears to be secondary to the callus cracking and causing a split in the underlying tissue. In addition, his daughter reports that he has been complaining of pain in  the left foot. There is a fluctuant area at the end of the foot and it appears there is some darker discoloration under the callus that covers the site where the wound was. 06/06/2022: The wound on his right foot has healed down to a narrow slit underneath a truly impressive amount of callus. The left foot has also tried to cover the wounded area with callus, but this is more open. There is slough buildup and some undermining secondary to the callus accumulation. The culture that I took last week only returned with very low levels of a variety of bacteria species and no antibiotics were prescribed. His foot is much less red and warm today. 06/14/2022: Both wounds are quite a bit smaller today with the usual callus accumulation. 07/16/2022: He returns after a month-long absence. He apparently saw podiatry on June 3 and they performed a debridement, but between then and today, he has built up substantial callus. It appears that moisture has gotten under the callus and caused substantial tissue breakdown. There is a foul odor coming from his right foot. No frank pus encountered. 07/24/2022: Both wounds look substantially better this week. There is no malodor present. He has his usual accumulation of thick callus. His culture grew out a vastly polymicrobial population with multiple drug resistances. He is currently  taking 3 different antibiotics to address the sensitivities. 08/07/2022: Both wounds look much better this week. There is less callus accumulation and the wound surface tissue looks healthier. He says he has another couple of days left of his antibiotics. Electronic Signature(s) Signed: 08/07/2022 9:41:52 AM By: Duanne Guess MD FACS Entered By: Duanne Guess on 08/07/2022 09:41:51 [name], [name] ([id number]) [id number] Page 4 of 10 -------------------------------------------------------------------------------- Physical Exam Details Patient Name: Date of Service: [name] 08/07/2022 9:15 A M Medical Record Number: [id number] Patient Account Number: [id number] Date of Birth/Sex: Treating RN: 17-Feb-1965 (57 y.o. M) Primary Care Provider: Marrianne Mood Other Clinician: Referring Provider: Treating Provider/Extender: Alena Bills in Treatment: 26 Constitutional Slightly hypertensive. . . . no acute distress. Respiratory Normal work of breathing on room air. Notes 08/07/2022: Both wounds look much better this week. There is less callus accumulation and the wound surface tissue looks healthier. Electronic Signature(s) Signed: 08/07/2022 9:42:24 AM By: Duanne Guess MD FACS Entered By: Duanne Guess on 08/07/2022 09:42:24 -------------------------------------------------------------------------------- Physician Orders Details Patient Name: Date of Service: [name] 08/07/2022 9:15 A M Medical Record Number: [id number] Patient Account Number: [id number] Date of Birth/Sex: Treating RN: [birth year] (57 y.o. 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 - Dr. Lady Gary - room 1 Return A Anesthetic (In clinic) Topical Lidocaine 4% applied to wound bed Bathing/ Shower/ Hygiene May shower and wash wound with soap and water. Non Wound Condition pply the following to affected area as directed: - urea cream 40% to callouses daily (do not put on open areas) A Wound Treatment Wound #3 - Foot Wound Laterality: Right Cleanser: Soap and Water 1 x Per Day/10 Days Discharge Instructions: May shower and wash  wound with dial antibacterial soap and water prior to dressing change. Prim Dressing: Maxorb Extra Ag+ Alginate Dressing, 2x2 (in/in) ary 1 x Per Day/10 Days JONDAVID, SINANAN ([id number]) [id number] Page 5 of 10 Discharge Instructions: Apply to wound bed as instructed Secondary Dressing: ALLEVYN Heel 4 1/2in x 5 1/2in / 10.5cm x 13.5cm 1 x Per Day/10 Days Discharge Instructions: Apply over primary dressing as directed. Secondary Dressing: Woven Gauze Sponge, Non-Sterile 4x4 in 1 x Per Day/10 Days Discharge Instructions: Apply over primary dressing as directed. Secured With: 46M Medipore H Soft Cloth Surgical T ape, 4 x 10 (in/yd) 1 x Per Day/10 Days Discharge Instructions: Secure with tape as directed. Wound #4 - Amputation Site - Transmetatarsal Wound Laterality: Left Cleanser: Soap and Water 1 x Per Day/10 Days Discharge Instructions: May shower and wash wound with dial antibacterial soap and water prior to dressing change. Prim Dressing: Maxorb Extra Ag+ Alginate Dressing, 2x2 (in/in) 1 x Per Day/10 Days ary Discharge Instructions: Apply to wound bed as instructed Secondary Dressing: ALLEVYN Heel 4 1/2in x 5 1/2in / 10.5cm x 13.5cm 1 x Per Day/10  Days Discharge Instructions: Apply over primary dressing as directed. Secondary Dressing: Woven Gauze Sponge, Non-Sterile 4x4 in 1 x Per Day/10 Days Discharge Instructions: Apply over primary dressing as directed. Secured With: 46M Medipore H Soft Cloth Surgical T ape, 4 x 10 (in/yd) 1 x Per Day/10 Days Discharge Instructions: Secure with tape as directed. Patient Medications llergies: Novolog U-100 Insulin aspart, penicillin A Notifications Medication Indication Start End 08/07/2022 lidocaine DOSE topical 4 % cream - cream topical Electronic Signature(s) Signed: 08/07/2022 10:02:06 AM By: Duanne Guess MD FACS Entered By: Duanne Guess on 08/07/2022 09:43:20 -------------------------------------------------------------------------------- Problem List Details Patient Name: Date of Service: [name] 08/07/2022 9:15 A M Medical Record Number: [id number] Patient Account Number: [id number] Date of Birth/Sex: Treating RN: 1965/07/27 (57 y.o. 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 02/02/2022 No Yes without evidence of necrosis L97.522 Non-pressure chronic ulcer of other part of left foot with fat layer exposed 02/02/2022 No Yes E11.621 Type 2 diabetes mellitus with foot ulcer 02/02/2022 No Yes Faciane, Elo ([id number]) [id number] Page 6 of 10 [phone] Acquired absence of unspecified foot 02/02/2022 No Yes Inactive Problems Resolved Problems Electronic Signature(s) Signed: 08/07/2022 9:40:26 AM By: Duanne Guess MD FACS Entered By: Duanne Guess on 08/07/2022 09:40:25 -------------------------------------------------------------------------------- Progress Note  Details Patient Name: Date of Service: [name] 08/07/2022 9:15 A M Medical Record Number: [id number] Patient Account Number: [id number] Date of Birth/Sex: Treating RN: May 22, 1965 (57 y.o. 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 The visit today was conducted with the assistance of the language line interpreter; the patient declines the use of an in person interpreter. He is a poorly controlled diabetic (last hemoglobin A1c 10.8, but it has been as high as 15 in the past) with CKD stage III and hypertension.  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 He required revision of the right TMA in October. This was done in the [facility]. It is not entirely clear how he was referred to our clinic however he has openings on the distal portion of the right foot as well as drainage coming from the plantar aspect of his left foot underneath some callus. The patient has just been applying dry gauze to both areas. 02/09/2022: The left-sided wound is quite a bit smaller today, but is covered with callus. The right sided wound has also contracted a little bit and is very clean, but there is also callus accumulation around the perimeter. Unfortunately, due to his Medicaid status, we have been unable to secure home health assistance and he has been changing the dressings on his own. He also has difficulty obtaining  dressing supplies. 02/19/2022: Both wounds are smaller, particularly on the right. The left is covered with a layer of callus. No concern for infection. 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 There is light slough on the surface with some surrounding callus. The left plantar foot wound has callused over. Underneath the callus, the there is a small opening in the skin. 03/06/2022: The wounds are about the same size this week. There is callus accumulation around each, but the surfaces are cleaner. 03/19/2022: The wound on the right is quite a bit smaller this week. There is minimal callus accumulation and light slough on the surface. The wound on the left is almost completely covered in callus. 03/26/2022: The wound on the right continues to contract and is narrower again this week. The wound on the left has become covered in callus once again. 04/24/2022: In the month since he was last seen, apparently the patient developed some purulent drainage from his foot and was seen by podiatry at [facility]. He was debrided and given antibiotics. Subsequently, his wounds actually look extremely good today. There is callus covering the plantar ulcer on the left. The right TMA site is quite a bit narrower and clean with just some callus and slough accumulation. 05/01/2022: The wound on the plantar surface of his left foot is closed under a thick layer of callus. On the right, the wound is substantially smaller with just a little bit of slough but also heavy buildup of callus around the wound opening. 05/08/2022: The left plantar foot ulcer remains closed. On the right, the wound continues to narrow significantly. There is still some depth to the wound, but it is clean. 05/22/2022: His wounds are closed. 05/29/2022: The wound on his right foot has reopened. This appears to be secondary to the callus cracking and causing a split in the underlying tissue. In addition, his daughter reports  that he has been complaining of pain in the left foot. There is a fluctuant area at the end of the foot and it appears there is some darker discoloration under the callus that covers the site where the wound was. 06/06/2022: The wound on his right foot has healed down to a narrow slit underneath a truly impressive amount of callus. The left foot has also tried to cover the [place], [name] ([id number]) [id number] Page 7 of 10 wounded area with callus, but this is more open. There is slough buildup and some undermining secondary to the callus accumulation. The culture that I took last week only returned with very low levels of a variety of bacteria species and no antibiotics  were prescribed. His foot is much less red and warm today. 06/14/2022: Both wounds are quite a bit smaller today with the usual callus accumulation. 07/16/2022: He returns after a month-long absence. He apparently saw podiatry on June 3 and they performed a debridement, but between then and today, he has built up substantial callus. It appears that moisture has gotten under the callus and caused substantial tissue breakdown. There is a foul odor coming from his right foot. No frank pus encountered. 07/24/2022: Both wounds look substantially better this week. There is no malodor present. He has his usual accumulation of thick callus. His culture grew out a vastly polymicrobial population with multiple drug resistances. He is currently taking 3 different antibiotics to address the sensitivities. 08/07/2022: Both wounds look much better this week. There is less callus accumulation and the wound surface tissue looks healthier. He says he has another couple of days left of his antibiotics. Patient History Family History Unknown History, Cancer, No family history of Diabetes, Heart Disease, Hereditary Spherocytosis, Hypertension, Kidney Disease, Lung Disease, Seizures, Stroke, Thyroid Problems, Tuberculosis. Social  History Never smoker, Marital Status - Married, Alcohol Use - Never, Drug Use - No History, Caffeine Use - Rarely. Medical History Hematologic/Lymphatic Patient has history of Lymphedema Cardiovascular Patient has history of Hypertension Endocrine Patient has history of Type II Diabetes Neurologic Patient has history of Neuropathy Medical A Surgical History Notes nd Genitourinary AKI Objective Constitutional Slightly hypertensive. no acute distress. Vitals Time Taken: 9:26 AM, Height: 72 in, Weight: 214 lbs, BMI: 29, Temperature: 98.4 F, Pulse: 80 bpm, Respiratory Rate: 20 breaths/min, Blood Pressure: 148/80 mmHg. Respiratory Normal work of breathing on room air. General Notes: 08/07/2022: Both wounds look much better this week. There is less callus accumulation and the wound surface tissue looks healthier. Integumentary (Hair, Skin) Wound #3 status is Open. Original cause of wound was Gradually Appeared. The date acquired was: 05/29/2022. The wound has been in treatment 10 weeks. The wound is located on the Right Foot. The wound measures 1.1cm length x 0.6cm width x 0.4cm depth; 0.518cm^2 area and 0.207cm^3 volume. There is Fat Layer (Subcutaneous Tissue) exposed. There is no tunneling or undermining noted. There is a medium amount of serosanguineous drainage noted. The wound margin is thickened. There is medium (34-66%) red, pink granulation within the wound bed. There is a medium (34-66%) amount of necrotic tissue within the wound bed including Eschar and Adherent Slough. The periwound skin appearance had no abnormalities noted for color. The periwound skin appearance exhibited: Callus, Dry/Scaly. Periwound temperature was noted as No Abnormality. Wound #4 status is Open. Original cause of wound was Hematoma. The date acquired was: 05/29/2022. The wound has been in treatment 10 weeks. The wound is located on the Left Amputation Site - Transmetatarsal. The wound measures 0.4cm length x  0.5cm width x 0.2cm depth; 0.157cm^2 area and 0.031cm^3 volume. There is Fat Layer (Subcutaneous Tissue) exposed. There is no tunneling or undermining noted. There is a medium amount of sanguinous drainage noted. The wound margin is well defined and not attached to the wound base. There is large (67-100%) red granulation within the wound bed. There is a small (1- 33%) amount of necrotic tissue within the wound bed including Adherent Slough. The periwound skin appearance had no abnormalities noted for color. The periwound skin appearance exhibited: Callus, Dry/Scaly. Periwound temperature was noted as No Abnormality. 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 There was a Excisional Skin/Subcutaneous Tissue Debridement with a total area of 0.52 sq cm performed by Duanne Guess, MD. With the following instrument(s): Curette to remove Non-Viable tissue/material. Material removed includes Callus, Subcutaneous Tissue, Slough, and Skin: Epidermis after achieving pain control using Lidocaine 4% Topical Solution. No specimens were taken. A time out was conducted at 09:36, prior to the start of the procedure. A Minimum amount of bleeding was controlled with Pressure. The procedure was tolerated well. Post Debridement Measurements: 1.1cm length x 0.6cm width x 0.4cm depth; 0.207cm^3 volume. Character of Wound/Ulcer Post Debridement is improved. Severity of Tissue Post Debridement is: Fat  layer exposed. Post procedure Diagnosis Wound #3: Same as Pre-Procedure General Notes: scribed for Dr. Lady Gary by [name], RN. 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 There was a Excisional Skin/Subcutaneous Tissue Debridement with a total area of 0.16 sq cm performed by Duanne Guess, MD. With the following instrument(s): Curette to remove Non-Viable tissue/material. Material removed includes Callus, Subcutaneous Tissue, Slough, and Skin: Epidermis after achieving pain control using Lidocaine 4% Topical Solution. No specimens were taken. A time out was conducted at 09:36, prior to the start of the procedure. A Minimum amount of bleeding was controlled with Pressure. The procedure was tolerated well. Post Debridement Measurements: 0.4cm length x 0.5cm width x 0.2cm depth; 0.031cm^3 volume. Character of Wound/Ulcer Post Debridement is improved. Severity of Tissue Post Debridement is: Fat layer exposed. Post procedure Diagnosis Wound #4: Same as Pre-Procedure General Notes: scribed for Dr. Lady Gary by [name], RN. Plan Follow-up Appointments: Return Appointment in 1 week. - Dr. Lady Gary - room 1 Anesthetic: (In clinic) Topical Lidocaine 4% applied to wound bed Bathing/ Shower/ Hygiene: May shower and wash wound with soap and water. Non Wound Condition: Apply the following to affected area as directed: - urea cream 40% to callouses daily (do not put on open areas) The following medication(s) was prescribed: lidocaine topical 4 % cream cream topical was prescribed at facility WOUND #3: - Foot Wound Laterality: Right Cleanser: Soap and Water 1 x Per Day/10 Days Discharge Instructions: May shower and wash wound with dial antibacterial soap and water prior to dressing change. Prim Dressing: Maxorb Extra Ag+ Alginate  Dressing, 2x2 (in/in) 1 x Per Day/10 Days ary Discharge Instructions: Apply to wound bed as instructed Secondary Dressing: ALLEVYN Heel 4 1/2in x 5 1/2in / 10.5cm x 13.5cm 1 x Per Day/10 Days Discharge Instructions: Apply over primary dressing as directed. Secondary Dressing: Woven Gauze Sponge, Non-Sterile 4x4 in 1 x Per Day/10 Days Discharge Instructions: Apply over primary dressing as directed. Secured With: 70M Medipore H Soft Cloth Surgical T ape, 4 x 10 (in/yd) 1 x Per Day/10 Days Discharge Instructions: Secure with tape as directed. WOUND #4: - Amputation Site - Transmetatarsal Wound Laterality: Left Cleanser: Soap and Water 1 x Per Day/10 Days Discharge Instructions: May shower and wash wound with dial antibacterial soap and water prior to dressing change. Prim Dressing: Maxorb Extra Ag+ Alginate Dressing, 2x2 (in/in)  1 x Per Day/10 Days ary Discharge Instructions: Apply to wound bed as instructed Secondary Dressing: ALLEVYN Heel 4 1/2in x 5 1/2in / 10.5cm x 13.5cm 1 x Per Day/10 Days Discharge Instructions: Apply over primary dressing as directed. Secondary Dressing: Woven Gauze Sponge, Non-Sterile 4x4 in 1 x Per Day/10 Days Discharge Instructions: Apply over primary dressing as directed. Secured With: 17M Medipore H Soft Cloth Surgical T ape, 4 x 10 (in/yd) 1 x Per Day/10 Days Discharge Instructions: Secure with tape as directed. 08/07/2022: Both wounds look much better this week. There is less callus accumulation and the wound surface tissue looks healthier. I used a curette to debride callus, slough, subcutaneous tissue, and senescent skin from each of his wounds. We will continue silver alginate to both sites with foam heel cups as padding and protection. He will complete his course of oral antibiotics. Follow-up in 1 week. Electronic Signature(s) Signed: 08/07/2022 9:44:06 AM By: Duanne Guess MD FACS Entered By: Duanne Guess on 08/07/2022 09:44:06 [name], [name] ([id number])  [id number] Page 9 of 10 -------------------------------------------------------------------------------- HxROS Details Patient Name: Date of Service: [name] 08/07/2022 9:15 A M Medical Record Number: [id number] Patient Account Number: [id number] Date of Birth/Sex: Treating RN: [birth year] (57 y.o. M) Primary Care Provider: Marrianne Mood Other Clinician: Referring Provider: Treating Provider/Extender: Alena Bills in Treatment: 26 Hematologic/Lymphatic Medical History: Positive for: Lymphedema Cardiovascular Medical History: Positive for: Hypertension Endocrine Medical History: Positive for: Type II Diabetes Treated with: Insulin, Oral agents Genitourinary Medical History: Past Medical History Notes: AKI Neurologic Medical History: Positive for: Neuropathy Immunizations Pneumococcal Vaccine: Received Pneumococcal Vaccination: Yes Received Pneumococcal Vaccination On or After 60th Birthday: Yes Implantable Devices No devices added Family and Social History Unknown History: Yes; Cancer: Yes; Diabetes: No; Heart Disease: No; Hereditary Spherocytosis: No; Hypertension: No; Kidney Disease: No; Lung Disease: No; Seizures: No; Stroke: No; Thyroid Problems: No; Tuberculosis: No; Never smoker; Marital Status - Married; Alcohol Use: Never; Drug Use: No History; Caffeine Use: Rarely; Financial Concerns: No; Food, Clothing or Shelter Needs: No; Support System Lacking: No; Transportation Concerns: No Electronic Signature(s) Signed: 08/07/2022 10:02:06 AM By: Duanne Guess MD FACS Entered By: Duanne Guess on 08/07/2022 09:41:58 -------------------------------------------------------------------------------- SuperBill Details Patient Name: Date of Service: [name] 08/07/2022 Medical Record Number: [id number] Patient Account Number: [id number] Date of Birth/Sex: Treating RN: December 20, 1965 (57 y.o. 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 E11.621 Type 2 diabetes mellitus with foot ulcer Z89.439 Acquired absence of unspecified foot Facility Procedures : CPT4 Code: [id number] Description: 11042 - DEB SUBQ TISSUE 20 SQ CM/< ICD-10 Diagnosis Description 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 Modifier: without evidence of n Quantity: 1 ecrosis Physician Procedures : CPT4 Code Description Modifier [id number] 99214 - WC PHYS LEVEL 4 - EST PT 25 ICD-10 Diagnosis Description 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 without evidence of n L97.522 Non-pressure chronic ulcer of  other part of left foot with fat layer exposed E11.621 Type 2 diabetes mellitus  with foot ulcer Z89.439 Acquired absence of unspecified foot Quantity: 1 ecrosis : [id number] 11042 - WC PHYS SUBQ TISS 20 SQ CM ICD-10 Diagnosis Description 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

## 2022-08-15 ENCOUNTER — Encounter (HOSPITAL_BASED_OUTPATIENT_CLINIC_OR_DEPARTMENT_OTHER): Payer: Medicaid Other | Admitting: General Surgery

## 2022-08-15 DIAGNOSIS — E11621 Type 2 diabetes mellitus with foot ulcer: Secondary | ICD-10-CM | POA: Diagnosis not present

## 2022-08-16 NOTE — Progress Notes (Signed)
[place], [name] ([id number]) [id number] Page 1 of 9 Visit Report for 08/15/2022 Arrival Information Details Patient Name: Date of Service: Samuel Most MA R 08/15/2022 11:15 A M Medical Record Number: [id number] Patient Account Number: [id number] Date of Birth/Sex: Treating RN: Jun 09, 1965 (57 y.o. [name] Primary Care Omya Winfield: Marrianne Mood Other Clinician: Referring Cristel Rail: Treating Marcellas Marchant/Extender: Alena Bills in Treatment: 27 Visit Information History Since Last Visit Added or deleted any medications: No Patient Arrived: Ambulatory Any new allergies or adverse reactions: No Arrival Time: 11:29 Had a fall or experienced change in No Accompanied By: self activities of daily living that may affect Transfer Assistance: None risk of falls: Patient Identification Verified: Yes Signs or symptoms of abuse/neglect since last visito No Secondary Verification Process Completed: Yes Hospitalized since last visit: No Patient Requires Transmission-Based Precautions: No Implantable device outside of the clinic excluding No Patient Has Alerts: No cellular tissue based products placed in the center since last visit: Has Dressing in Place as Prescribed: Yes Pain Present Now: No Electronic Signature(s) Signed: 08/15/2022 5:33:12 PM By: Zenaida Deed RN, BSN Entered By: Zenaida Deed on 08/15/2022 11:30:22 -------------------------------------------------------------------------------- Encounter Discharge Information Details Patient Name: Date of Service: Samuel Most MA R 08/15/2022 11:15 A M Medical Record Number: [id number] Patient Account Number: [id number] Date of Birth/Sex: Treating RN: 01/20/1966 (57 y.o. [name] Primary Care Kamin Niblack: Marrianne Mood Other Clinician: Referring Reshanda Lewey: Treating Brandin Dilday/Extender: Alena Bills in Treatment: 27 Encounter Discharge Information  Items Post Procedure Vitals Discharge Condition: Stable Temperature (F): 99.5 Ambulatory Status: Ambulatory Pulse (bpm): 74 Discharge Destination: Home Respiratory Rate (breaths/min): 18 Transportation: Private Auto Blood Pressure (mmHg): 148/83 Accompanied By: daughter Schedule Follow-up Appointment: Yes Clinical Summary of Care: Patient Declined Electronic Signature(s) Signed: 08/15/2022 5:33:12 PM By: Zenaida Deed RN, BSN Entered By: Zenaida Deed on 08/15/2022 12:19:20 Isaac Laud ([id number]) [id number] Page 2 of 9 -------------------------------------------------------------------------------- Lower Extremity Assessment Details Patient Name: Date of Service: Samuel Most MA R 08/15/2022 11:15 A M Medical Record Number: [id number] Patient Account Number: [id number] Date of Birth/Sex: Treating RN: [birth year] (57 y.o. [name] Primary Care Shevawn Langenberg: Marrianne Mood Other Clinician: Referring Maudy Yonan: Treating Zafir Schauer/Extender: Alena Bills in Treatment: 27 Edema Assessment Assessed: Kyra Searles: No] Franne Forts: No] Edema: [Left: No] [Right: No] Calf Left: Right: Point of Measurement: From Medial Instep 38 cm 38.5 cm Ankle Left: Right: Point of Measurement: From Medial Instep 27.8 cm 27.5 cm Vascular Assessment Pulses: Dorsalis Pedis Palpable: [Left:Yes] [Right:Yes] Extremity colors, hair growth, and conditions: Extremity Color: [Left:Hyperpigmented] [Right:Hyperpigmented] Hair Growth on Extremity: [Left:No] [Right:No] Temperature of Extremity: [Left:Warm < 3 seconds] [Right:Warm < 3 seconds] Electronic Signature(s) Signed: 08/15/2022 5:33:12 PM By: Zenaida Deed RN, BSN Entered By: Zenaida Deed on 08/15/2022 11:37:59 -------------------------------------------------------------------------------- Multi Wound Chart Details Patient Name: Date of Service: Samuel Most MA R 08/15/2022 11:15 A M Medical Record  Number: [id number] Patient Account Number: [id number] Date of Birth/Sex: Treating RN: 1965/04/16 (57 y.o. [name] Primary Care Jo-Anne Kluth: Marrianne Mood Other Clinician: Referring Halley Kincer: Treating Sher Hellinger/Extender: Alena Bills in Treatment: 27 Vital Signs Height(in): 72 Capillary Blood Glucose(mg/dl): 235 Weight(lbs): 573 Pulse(bpm): 74 Body Mass Index(BMI): 29 Blood Pressure(mmHg): 148/83 Temperature(F): 99.5 Respiratory Rate(breaths/min): 18 [3:Photos:] [N/A:N/A [id number] Page 3 of 9] Right, Plantar Foot Left Amputation Site - Transmetatarsal N/A Wound Location: Gradually Appeared Hematoma N/A Wounding Event: Diabetic Wound/Ulcer of the Lower Diabetic Wound/Ulcer of the Lower N/A Primary Etiology: Extremity Extremity Lymphedema,  Hypertension, Type II Lymphedema, Hypertension, Type II N/A Comorbid History: Diabetes, Neuropathy Diabetes, Neuropathy 05/29/2022 05/29/2022 N/A Date Acquired: 14 11 N/A Weeks of Treatment: Open Open N/A Wound Status: No No N/A Wound Recurrence: 1.3x1.7x0.5 0.6x0.6x0.5 N/A Measurements L x W x D (cm) 1.736 0.283 N/A A (cm) : rea 0.868 0.141 N/A Volume (cm) : -2097.50% 96.40% N/A % Reduction in A rea: -5325.00% 98.20% N/A % Reduction in Volume: Grade 2 Grade 2 N/A Classification: Medium Medium N/A Exudate A mount: Sanguinous Serosanguineous N/A Exudate Type: red red, brown N/A Exudate Color: Thickened Distinct, outline attached N/A Wound Margin: Large (67-100%) Large (67-100%) N/A Granulation A mount: Red Red N/A Granulation Quality: Small (1-33%) Small (1-33%) N/A Necrotic A mount: Fat Layer (Subcutaneous Tissue): Yes Fat Layer (Subcutaneous Tissue): Yes N/A Exposed Structures: Fascia: No Fascia: No Tendon: No Tendon: No Muscle: No Muscle: No Joint: No Joint: No Bone: No Bone: No Small (1-33%) None N/A Epithelialization: Debridement -  Selective/Open Wound Debridement - Selective/Open Wound N/A Debridement: Pre-procedure Verification/Time Out 11:50 11:50 N/A Taken: Lidocaine 4% T opical Solution Lidocaine 4% T opical Solution N/A Pain Control: Callus, Slough Callus, Slough N/A Tissue Debrided: Skin/Epidermis Skin/Epidermis N/A Level: 2.6 0.57 N/A Debridement A (sq cm): rea Curette Curette N/A Instrument: Minimum Minimum N/A Bleeding: Pressure Pressure N/A Hemostasis A chieved: 0 0 N/A Procedural Pain: 0 0 N/A Post Procedural Pain: Procedure was tolerated well Procedure was tolerated well N/A Debridement Treatment Response: 1.3x1.7x0.5 0.6x0.6x0.5 N/A Post Debridement Measurements L x W x D (cm) 0.868 0.141 N/A Post Debridement Volume: (cm) Callus: Yes Callus: Yes N/A Periwound Skin Texture: Dry/Scaly: Yes Dry/Scaly: Yes N/A Periwound Skin Moisture: No Abnormalities Noted No Abnormalities Noted N/A Periwound Skin Color: No Abnormality No Abnormality N/A Temperature: Debridement Debridement N/A Procedures Performed: Treatment Notes Electronic Signature(s) Signed: 08/15/2022 12:11:50 PM By: Duanne Guess MD FACS Signed: 08/15/2022 5:33:12 PM By: Zenaida Deed RN, BSN Entered By: Duanne Guess on 08/15/2022 12:11:50 -------------------------------------------------------------------------------- Multi-Disciplinary Care Plan Details Patient Name: Date of Service: Samuel Most MA R 08/15/2022 11:15 A M Medical Record Number: [id number] Patient Account Number: [id number] Date of Birth/Sex: Treating RN: [birth year] (57 y.o. [name] Primary Care Durant Scibilia: Marrianne Mood Other Clinician: Referring Jillayne Witte: Treating Mohid Furuya/Extender: Alena Bills in Treatment: [address] [place] ([id number]) [id number] Page 4 of 9 Multidisciplinary Care Plan reviewed with physician Active Inactive Nutrition Nursing Diagnoses: Impaired glucose  control: actual or potential Goals: Patient/caregiver verbalizes understanding of need to maintain therapeutic glucose control per primary care physician Date Initiated: 02/02/2022 Target Resolution Date: 08/31/2022 Goal Status: Active Interventions: Assess patient nutrition upon admission and as needed per policy Provide education on elevated blood sugars and impact on wound healing Treatment Activities: Dietary management education, guidance and counseling : 02/02/2022 Notes: Wound/Skin Impairment Nursing Diagnoses: Impaired tissue integrity Goals: Patient/caregiver will verbalize understanding of skin care regimen Date Initiated: 03/06/2022 Target Resolution Date: 08/31/2022 Goal Status: Active Ulcer/skin breakdown will have a volume reduction of 30% by week 4 Date Initiated: 02/02/2022 Date Inactivated: 06/06/2022 Target Resolution Date: 06/27/2022 Unmet Reason: continues to grow Goal Status: Unmet callous Interventions: Assess ulceration(s) every visit Provide education on ulcer and skin care Treatment Activities: Patient referred to home care : 02/02/2022 Skin care regimen initiated : 02/02/2022 Notes: Electronic Signature(s) Signed: 08/15/2022 5:33:12 PM By: Zenaida Deed RN, BSN Entered By: Zenaida Deed on 08/15/2022 11:45:54 -------------------------------------------------------------------------------- Pain Assessment Details Patient Name: Date of Service: Samuel Most MA R 08/15/2022 11:15 A M Medical Record  Number: [id number] Patient Account Number: [id number] Date of Birth/Sex: Treating RN: 1965/10/24 (57 y.o. [name] Primary Care Bora Bost: Marrianne Mood Other Clinician: Referring Quincey Nored: Treating Jacayla Nordell/Extender: Alena Bills in Treatment: 27 Active Problems Location of Pain Severity and Description of Pain Patient Has Paino No Site Locations Rate the pain. [place], [name] ([id number]) [id number]  Page 5 of 9 Rate the pain. Current Pain Level: 0 Pain Management and Medication Current Pain Management: Electronic Signature(s) Signed: 08/15/2022 5:33:12 PM By: Zenaida Deed RN, BSN Entered By: Zenaida Deed on 08/15/2022 11:31:37 -------------------------------------------------------------------------------- Patient/Caregiver Education Details Patient Name: Date of Service: Samuel Most MA R 7/17/2024andnbsp11:15 A M Medical Record Number: [id number] Patient Account Number: [id number] Date of Birth/Gender: Treating RN: 10-22-1965 (57 y.o. [name] Primary Care Physician: Marrianne Mood Other Clinician: Referring Physician: Treating Physician/Extender: Alena Bills in Treatment: 27 Education Assessment Education Provided To: Patient Education Topics Provided Offloading: Methods: Explain/Verbal Responses: Reinforcements needed, State content correctly Wound/Skin Impairment: Methods: Explain/Verbal Responses: Reinforcements needed, State content correctly Electronic Signature(s) Signed: 08/15/2022 5:33:12 PM By: Zenaida Deed RN, BSN Entered By: Zenaida Deed on 08/15/2022 11:46:18 -------------------------------------------------------------------------------- Wound Assessment Details Patient Name: Date of Service: Samuel Most MA R 08/15/2022 11:15 A Damien Fusi, [name] ([id number]) [id number] Page 6 of 9 Medical Record Number: [id number] Patient Account Number: [id number] Date of Birth/Sex: Treating RN: [birth year] (57 y.o. [name] Primary Care Anaiah Mcmannis: Marrianne Mood Other Clinician: Referring Tyaisha Cullom: Treating Liany Mumpower/Extender: Alena Bills in Treatment: 27 Wound Status Wound Number: 3 Primary Etiology: Diabetic Wound/Ulcer of the Lower Extremity Wound Location: Right, Plantar Foot Wound Status: Open Wounding Event: Gradually Appeared Comorbid History:  Lymphedema, Hypertension, Type II Diabetes, Neuropathy Date Acquired: 05/29/2022 Weeks Of Treatment: 11 Clustered Wound: No Photos Wound Measurements Length: (cm) 1.3 Width: (cm) 1.7 Depth: (cm) 0.5 Area: (cm) 1.736 Volume: (cm) 0.868 % Reduction in Area: -2097.5% % Reduction in Volume: -5325% Epithelialization: Small (1-33%) Tunneling: No Undermining: No Wound Description Classification: Grade 2 Wound Margin: Thickened Exudate Amount: Medium Exudate Type: Sanguinous Exudate Color: red Foul Odor After Cleansing: No Slough/Fibrino Yes Wound Bed Granulation Amount: Large (67-100%) Exposed Structure Granulation Quality: Red Fascia Exposed: No Necrotic Amount: Small (1-33%) Fat Layer (Subcutaneous Tissue) Exposed: Yes Necrotic Quality: Adherent Slough Tendon Exposed: No Muscle Exposed: No Joint Exposed: No Bone Exposed: No Periwound Skin Texture Texture Color No Abnormalities Noted: No No Abnormalities Noted: Yes Callus: Yes Temperature / Pain Temperature: No Abnormality Moisture No Abnormalities Noted: Yes Treatment Notes Wound #3 (Foot) Wound Laterality: Plantar, Right Cleanser Soap and Water Discharge Instruction: May shower and wash wound with dial antibacterial soap and water prior to dressing change. Peri-Wound Care Topical Primary Dressing Maxorb Extra Ag+ Alginate Dressing, 2x2 (in/in) Discharge Instruction: Apply to wound bed as instructed [facility], [name] ([id number]) [id number] Page 7 of 9 Secondary Dressing ALLEVYN Heel 4 1/2in x 5 1/2in / 10.5cm x 13.5cm Discharge Instruction: Apply over primary dressing as directed. Woven Gauze Sponge, Non-Sterile 4x4 in Discharge Instruction: Apply over primary dressing as directed. Secured With 80M Medipore H Soft Cloth Surgical T ape, 4 x 10 (in/yd) Discharge Instruction: Secure with tape as directed. Compression Wrap Compression Stockings Add-Ons Electronic Signature(s) Signed:  08/15/2022 5:33:12 PM By: Zenaida Deed RN, BSN Entered By: Zenaida Deed on 08/15/2022 11:41:22 -------------------------------------------------------------------------------- Wound Assessment Details Patient Name: Date of Service: Samuel Most MA R 08/15/2022 11:15 A M Medical Record Number: [id number] Patient Account Number: [id number] Date of Birth/Sex:  Treating RN: 06-18-1965 (57 y.o. [name] Primary Care [place] Winsor: Marrianne Mood Other Clinician: Referring Jacquelina Hewins: Treating Zeynep Fantroy/Extender: Alena Bills in Treatment: 27 Wound Status Wound Number: 4 Primary Etiology: Diabetic Wound/Ulcer of the Lower Extremity Wound Location: Left Amputation Site - Transmetatarsal Wound Status: Open Wounding Event: Hematoma Comorbid Lymphedema, Hypertension, Type II Diabetes, Neuropathy History: Date Acquired: 05/29/2022 Weeks Of Treatment: 11 Clustered Wound: No Photos Wound Measurements Length: (cm) 0.6 Width: (cm) 0.6 Depth: (cm) 0.5 Area: (cm) 0.283 Volume: (cm) 0.141 % Reduction in Area: 96.4% % Reduction in Volume: 98.2% Epithelialization: None Tunneling: No Undermining: No Wound Description Classification: Grade 2 Wound Margin: Distinct, outline attached Exudate Amount: Medium Exudate Type: Serosanguineous Exudate Color: red, brown Foul Odor After Cleansing: No Slough/Fibrino Yes Wound Bed [place], [name] ([id number]) [id number] Page 8 of 9 Granulation Amount: Large (67-100%) Exposed Structure Granulation Quality: Red Fascia Exposed: No Necrotic Amount: Small (1-33%) Fat Layer (Subcutaneous Tissue) Exposed: Yes Necrotic Quality: Adherent Slough Tendon Exposed: No Muscle Exposed: No Joint Exposed: No Bone Exposed: No Periwound Skin Texture Texture Color No Abnormalities Noted: No No Abnormalities Noted: Yes Callus: Yes Temperature / Pain Temperature: No Abnormality Moisture No Abnormalities Noted:  Yes Treatment Notes Wound #4 (Amputation Site - Transmetatarsal) Wound Laterality: Left Cleanser Soap and Water Discharge Instruction: May shower and wash wound with dial antibacterial soap and water prior to dressing change. Peri-Wound Care Topical Primary Dressing Maxorb Extra Ag+ Alginate Dressing, 2x2 (in/in) Discharge Instruction: Apply to wound bed as instructed Secondary Dressing ALLEVYN Heel 4 1/2in x 5 1/2in / 10.5cm x 13.5cm Discharge Instruction: Apply over primary dressing as directed. Woven Gauze Sponge, Non-Sterile 4x4 in Discharge Instruction: Apply over primary dressing as directed. Secured With 63M Medipore H Soft Cloth Surgical T ape, 4 x 10 (in/yd) Discharge Instruction: Secure with tape as directed. Compression Wrap Compression Stockings Add-Ons Electronic Signature(s) Signed: 08/15/2022 5:33:12 PM By: Zenaida Deed RN, BSN Entered By: Zenaida Deed on 08/15/2022 11:43:06 -------------------------------------------------------------------------------- Vitals Details Patient Name: Date of Service: Samuel Most MA R 08/15/2022 11:15 A M Medical Record Number: [id number] Patient Account Number: [id number] Date of Birth/Sex: Treating RN: January 16, 1966 (57 y.o. [name] Primary Care Margeret Stachnik: Marrianne Mood Other Clinician: Referring Kiyara Bouffard: Treating Jowell Bossi/Extender: Alena Bills in Treatment: 27 Vital Signs Time Taken: 11:30 Temperature (F): 99.5 Height (in): 72 Pulse (bpm): 74 Weight (lbs): 214 Respiratory Rate (breaths/min): 18 Body Mass Index (BMI): 29 Blood Pressure (mmHg): 148/83 Capillary Blood Glucose (mg/dl): 956 Opfer, Andrez ([id number]) [id number] Page 9 of 9 Reference Range: 80 - 120 mg / dl Notes glucose per pt report this am Electronic Signature(s) Signed: 08/15/2022 5:33:12 PM By: Zenaida Deed RN, BSN Entered By: Zenaida Deed on 08/15/2022 11:31:30

## 2022-08-16 NOTE — Progress Notes (Signed)
[place], [name] ([id number]) [id number] Page 1 of 10 Visit Report for 08/15/2022 Chief Complaint Document Details Patient Name: Date of Service: [name] 08/15/2022 11:15 A M Medical Record Number: [id number] Patient Account Number: [id number] Date of Birth/Sex: Treating RN: [birth year] (57 y.o. 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 [name] Primary Care Provider: Marrianne Mood Other Clinician: Referring Provider: Treating Provider/Extender: Alena Bills in Treatment: 27 Debridement Performed for Assessment: Wound #3 Right,Plantar Foot Performed By: Physician Duanne Guess, MD Debridement Type: Debridement Severity of Tissue Pre Debridement: Fat layer exposed Level of Consciousness (Pre-procedure): Awake and Alert Pre-procedure Verification/Time Out Yes - 11:50 Taken: Start Time: 11:52 Pain Control: Lidocaine 4% Topical Solution Percent of Wound Bed Debrided: 150% T Area Debrided (cm): otal 2.6 Tissue and other material debrided: Non-Viable, Callus, Slough, Skin: Epidermis, Slough Level:  Skin/Epidermis Debridement Description: Selective/Open Wound Instrument: Curette Bleeding: Minimum Hemostasis Achieved: Pressure Procedural Pain: 0 Post Procedural Pain: 0 Response to Treatment: Procedure was tolerated well Level of Consciousness (Post- Awake and Alert procedure): Post Debridement Measurements of Total Wound Length: (cm) 1.3 Width: (cm) 1.7 Depth: (cm) 0.5 Volume: (cm) 0.868 Character of Wound/Ulcer Post Debridement: Improved [name] ([id number]) [id number] Page 2 of 10 Severity of Tissue Post Debridement: Fat layer exposed Post Procedure Diagnosis Same as Pre-procedure Notes scribed for Dr. Lady Gary by Zenaida Deed, RN Electronic Signature(s) Signed: 08/15/2022 12:38:39 PM By: Duanne Guess MD FACS Signed: 08/15/2022 5:33:12 PM By: Zenaida Deed RN, BSN Entered By: Zenaida Deed on 08/15/2022 11:58:25 -------------------------------------------------------------------------------- Debridement Details Patient Name: Date of Service: [name] 08/15/2022 11:15 A M Medical Record Number: [id number] Patient Account Number: [id number] Date of Birth/Sex: Treating RN: 1965-12-26 (57 y.o. [name] Primary Care Provider: Marrianne Mood Other Clinician: Referring Provider: Treating Provider/Extender: Alena Bills in Treatment: 27 Debridement Performed for Assessment: Wound #4 Left Amputation Site - Transmetatarsal Performed By: Physician Duanne Guess, MD Debridement Type: Debridement Severity of Tissue Pre Debridement: Fat layer exposed Level of Consciousness (Pre-procedure): Awake and Alert Pre-procedure Verification/Time Out Yes - 11:50 Taken: Start Time: 11:52 Pain Control: Lidocaine 4% Topical Solution Percent of Wound Bed Debrided: 200% T Area Debrided (cm): otal 0.57 Tissue and other material debrided: Non-Viable, Callus, Slough, Skin: Epidermis, Slough Level:  Skin/Epidermis Debridement Description: Selective/Open Wound Instrument: Curette Bleeding: Minimum Hemostasis Achieved: Pressure Procedural Pain: 0 Post Procedural Pain: 0 Response to Treatment: Procedure was tolerated well Level of Consciousness (Post- Awake and Alert procedure): Post Debridement Measurements of Total Wound Length: (cm) 0.6 Width: (cm) 0.6 Depth: (cm) 0.5 Volume: (cm) 0.141 Character of Wound/Ulcer Post Debridement: Improved Severity of Tissue Post Debridement: Fat layer exposed Post Procedure Diagnosis Same as Pre-procedure Notes scribed for Dr. Lady Gary by Zenaida Deed, RN Electronic Signature(s) Signed: 08/15/2022 12:38:39 PM By: Duanne Guess MD FACS Signed: 08/15/2022 5:33:12 PM By: Zenaida Deed RN, BSN Entered  By: Zenaida Deed on 08/15/2022 12:00:05 [name] ([id number]) [id number] Page 3 of 10 -------------------------------------------------------------------------------- HPI Details Patient Name: Date of Service: [name] 08/15/2022 11:15 A M Medical Record Number: [id number] Patient Account Number: [id number] Date of Birth/Sex: Treating RN: Jun 11, 1965 (57 y.o. [name] Primary Care Provider: Marrianne Mood Other Clinician: Referring Provider: Treating Provider/Extender: Alena Bills in Treatment: 27 History of Present Illness HPI Description: ADMISSION 02/02/2022 This is a 57 year old [place] man who speaks only Arabic. The visit today was conducted with the assistance of the language line interpreter; the patient declines the use of an in person interpreter. He is a poorly controlled diabetic (last hemoglobin A1c 10.8, but it has been as high as 15 in the past) with CKD stage III and hypertension. 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 He required revision of the right TMA  in October. This was done in the [facility] system. It is not entirely clear how he was referred to our clinic however he has openings on the distal portion of the right foot as well as drainage coming from the plantar aspect of his left foot underneath some callus. The patient has just been applying dry gauze to both areas. 02/09/2022: The left-sided wound is quite a bit smaller today, but is covered with callus. The right sided wound has also contracted a little bit and is very clean, but there is also callus accumulation around the perimeter. Unfortunately, due to his Medicaid status, we have been unable to secure home health assistance and he has been changing the dressings on his own. He also has difficulty obtaining dressing supplies. 02/19/2022: Both wounds are smaller, particularly on the right. The left is covered with a layer of callus. No concern for infection. 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 There is light slough on the surface with some surrounding callus. The left plantar foot wound has callused over. Underneath the callus, the there is a small opening in the skin. 03/06/2022: The wounds are about the same size this week. There is callus accumulation around each, but the surfaces are cleaner. 03/19/2022: The wound on the right is quite a bit smaller this week. There is minimal callus accumulation and light slough on the surface. The wound on the left is almost completely covered in callus. 03/26/2022: The wound on the right continues to contract and is narrower again this week. The wound on the left has become covered in callus once again. 04/24/2022: In the month since he was last seen, apparently the patient developed some purulent drainage from his foot and was seen by podiatry at [facility]. He was debrided and given antibiotics. Subsequently, his wounds actually look extremely good today. There is callus covering the plantar ulcer on the  left. The right TMA site is quite a bit narrower and clean with just some callus and slough accumulation. 05/01/2022: The wound on the plantar surface of his left foot is closed under a thick layer of callus. On the right, the wound is substantially smaller with just a little bit of slough but also heavy buildup of callus around the wound opening. 05/08/2022: The left plantar foot ulcer remains closed. On the right, the wound continues to narrow significantly. There is still some depth to the wound, but it is clean. 05/22/2022: His wounds are closed. 05/29/2022: The wound on his right foot has reopened. This appears to be secondary to  the callus cracking and causing a split in the underlying tissue. In addition, his daughter reports that he has been complaining of pain in the left foot. There is a fluctuant area at the end of the foot and it appears there is some darker discoloration under the callus that covers the site where the wound was. 06/06/2022: The wound on his right foot has healed down to a narrow slit underneath a truly impressive amount of callus. The left foot has also tried to cover the wounded area with callus, but this is more open. There is slough buildup and some undermining secondary to the callus accumulation. The culture that I took last week only returned with very low levels of a variety of bacteria species and no antibiotics were prescribed. His foot is much less red and warm today. 06/14/2022: Both wounds are quite a bit smaller today with the usual callus accumulation. 07/16/2022: He returns after a month-long absence. He apparently saw podiatry on June 3 and they performed a debridement, but between then and today, he has built up substantial callus. It appears that moisture has gotten under the callus and caused substantial tissue breakdown. There is a foul odor coming from his right foot. No frank pus encountered. 07/24/2022: Both wounds look substantially better this week. There  is no malodor present. He has his usual accumulation of thick callus. His culture grew out a vastly polymicrobial population with multiple drug resistances. He is currently taking 3 different antibiotics to address the sensitivities. 08/07/2022: Both wounds look much better this week. There is less callus accumulation and the wound surface tissue looks healthier. He says he has another couple of days left of his antibiotics. 08/15/2022: The wounds look roughly the same today. He has built up a fair amount of callus around them. There is some slough on the surfaces. No concern for infection. He ran out of dressing supplies and therefore did not change his dressings all week. Electronic Signature(s) Signed: 08/15/2022 12:12:49 PM By: Duanne Guess MD FACS Entered By: Duanne Guess on 08/15/2022 12:12:49 [name] ([id number]) [id number] Page 4 of 10 -------------------------------------------------------------------------------- Physical Exam Details Patient Name: Date of Service: [name] 08/15/2022 11:15 A M Medical Record Number: [id number] Patient Account Number: [id number] Date of Birth/Sex: Treating RN: 20-Jul-1965 (57 y.o. [name] Primary Care Provider: Marrianne Mood Other Clinician: Referring Provider: Treating Provider/Extender: Alena Bills in Treatment: 27 Constitutional Slightly hypertensive. . . . no acute distress. Respiratory Normal work of breathing on room air. Notes 08/15/2022: The wounds look roughly the same today. He has built up a fair amount of callus around them. There is some slough on the surfaces. No concern for infection. Electronic Signature(s) Signed: 08/15/2022 12:15:56 PM By: Duanne Guess MD FACS Entered By: Duanne Guess on 08/15/2022 12:15:56 -------------------------------------------------------------------------------- Physician Orders Details Patient Name: Date of  Service: [name] 08/15/2022 11:15 A M Medical Record Number: [id number] Patient Account Number: [id number] Date of Birth/Sex: Treating RN: 13-Jun-1965 (57 y.o. 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 - Dr. Lady Gary - room 1 Return A Monday 7/29 @ 09:45 am Anesthetic (In clinic) Topical Lidocaine 4% applied to wound bed Bathing/ Shower/ Hygiene May shower and wash wound with soap and water. Non Wound Condition pply the following to affected area as directed: - urea cream 40% to callouses daily (do not put on open areas) A Wound Treatment Wound #3 - Foot Wound Laterality: Plantar, Right Cleanser: Soap and Water Every Other Day/10 Days Discharge Instructions: May shower and wash wound with dial antibacterial soap and water prior to dressing change. [place], [name] ([id number]) [id number] Page 5 of 10 Prim Dressing: Maxorb Extra Ag+ Alginate Dressing, 2x2 (in/in) Every Other Day/10 Days ary Discharge Instructions: Apply to wound bed as instructed Secondary Dressing: ALLEVYN Heel 4 1/2in x 5 1/2in / 10.5cm x 13.5cm Every Other Day/10 Days Discharge Instructions: Apply over primary dressing as directed. Secondary Dressing: Woven Gauze Sponge, Non-Sterile 4x4 in Every Other Day/10 Days Discharge Instructions: Apply over primary dressing as directed. Secured With: 17M Medipore H Soft Cloth Surgical T ape, 4 x 10  (in/yd) Every Other Day/10 Days Discharge Instructions: Secure with tape as directed. Wound #4 - Amputation Site - Transmetatarsal Wound Laterality: Left Cleanser: Soap and Water 1 x Per Day/10 Days Discharge Instructions: May shower and wash wound with dial antibacterial soap and water prior to dressing change. Prim Dressing: Maxorb Extra Ag+ Alginate Dressing, 2x2 (in/in) 1 x Per Day/10 Days ary Discharge Instructions: Apply to wound bed as instructed Secondary Dressing: ALLEVYN Heel 4 1/2in x 5 1/2in / 10.5cm x 13.5cm 1 x Per Day/10 Days Discharge Instructions: Apply over primary dressing as directed. Secondary Dressing: Woven Gauze Sponge, Non-Sterile 4x4 in 1 x Per Day/10 Days Discharge Instructions: Apply over primary dressing as directed. Secured With: 17M Medipore H Soft Cloth Surgical T ape, 4 x 10 (in/yd) 1 x Per Day/10 Days Discharge Instructions: Secure with tape as directed. Electronic Signature(s) Signed: 08/15/2022 12:38:39 PM By: Duanne Guess MD FACS Entered By: Duanne Guess on 08/15/2022 12:16:11 -------------------------------------------------------------------------------- Problem List Details Patient Name: Date of Service: [name] 08/15/2022 11:15 A M Medical Record Number: [id number] Patient Account Number: [id number] Date of Birth/Sex: Treating RN: 1965/04/12 (57 y.o. 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 02/02/2022 No Yes without evidence of necrosis L97.522 Non-pressure chronic ulcer of other part of left foot with fat layer exposed 02/02/2022 No Yes E11.621 Type 2 diabetes mellitus with foot ulcer 02/02/2022 No Yes Z89.439 Acquired absence of  unspecified foot 02/02/2022 No Yes Hoge, Cristan ([id number]) [id number] Page 6 of 10 Inactive Problems Resolved Problems Electronic Signature(s) Signed: 08/15/2022 12:11:39 PM By: Duanne Guess MD FACS Entered By: Duanne Guess on 08/15/2022 12:11:39 -------------------------------------------------------------------------------- Progress Note Details Patient Name: Date of Service: [name] 08/15/2022 11:15 A M Medical Record Number: [id number] Patient Account Number: [id number] Date of Birth/Sex: Treating RN: 1965-06-10 (57 y.o. 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 The visit today was conducted with the assistance of the language line interpreter; the patient declines the use of an in person interpreter. He is a poorly controlled diabetic (last hemoglobin A1c 10.8, but it has been as high as 15 in the past) with CKD stage III and hypertension. 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 He required revision of the right TMA in October. This was done in the [facility] system. It is not entirely clear how he was referred to our clinic however he has openings on the distal portion of the right foot as well as drainage coming from the plantar aspect of his left foot underneath some callus. The patient has just been applying dry gauze to both  areas. 02/09/2022: The left-sided wound is quite a bit smaller today, but is covered with callus. The right sided wound has also contracted a little bit and is very clean, but there is also callus accumulation around the perimeter. Unfortunately, due to his Medicaid status, we have been unable to secure home health assistance and he has been changing the dressings on his own. He also has difficulty obtaining dressing supplies. 02/19/2022: Both wounds are smaller, particularly on the right. The left is covered with a layer of callus. No concern for infection. 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 There is light slough on the surface with some surrounding callus. The left plantar foot wound has callused over. Underneath the callus, the there is a small opening in the skin. 03/06/2022: The wounds are about the same size this week. There is callus accumulation around each, but the surfaces are cleaner. 03/19/2022: The wound on the right is quite a bit smaller this week. There is minimal callus accumulation and light slough on the surface. The wound on the left is almost completely covered in callus. 03/26/2022: The wound on the right continues to contract and is narrower again this week. The wound on the left has become covered in callus once again. 04/24/2022: In the month since he was last seen, apparently the patient developed some purulent drainage from his foot and was seen by podiatry at [facility]. He was debrided and given antibiotics. Subsequently, his wounds actually look extremely good today. There is callus covering the plantar ulcer on the left. The right TMA site is quite a bit narrower and clean with just some callus and slough accumulation. 05/01/2022: The wound on the plantar surface of his left foot is closed under a thick layer of callus. On the right, the wound is substantially smaller with just a little bit of slough but also heavy buildup of callus around  the wound opening. 05/08/2022: The left plantar foot ulcer remains closed. On the right, the wound continues to narrow significantly. There is still some depth to the wound, but it is clean. 05/22/2022: His wounds are closed. 05/29/2022: The wound on his right foot has reopened. This appears to be secondary to the callus cracking and causing a split in the underlying tissue. In addition, his daughter reports that he has been complaining of pain in the left foot. There is a fluctuant area at the end of the foot and it appears there is some darker discoloration under the callus that covers the site where the wound was. 06/06/2022: The wound on his right foot has healed down to a narrow slit underneath a truly  impressive amount of callus. The left foot has also tried to cover the wounded area with callus, but this is more open. There is slough buildup and some undermining secondary to the callus accumulation. The culture that I took last week only returned with very low levels of a variety of bacteria species and no antibiotics were prescribed. His foot is much less red and warm today. 06/14/2022: Both wounds are quite a bit smaller today with the usual callus accumulation. 07/16/2022: He returns after a month-long absence. He apparently saw podiatry on June 3 and they performed a debridement, but between then and today, he has built up substantial callus. It appears that moisture has gotten under the callus and caused substantial tissue breakdown. There is a foul odor coming from his right foot. No frank pus encountered. [place], [name] ([id number]) [id number] Page 7 of 10 07/24/2022: Both wounds look substantially better this week. There is no malodor present. He has his usual accumulation of thick callus. His culture grew out a vastly polymicrobial population with multiple drug resistances. He is currently taking 3 different antibiotics to address the sensitivities. 08/07/2022: Both  wounds look much better this week. There is less callus accumulation and the wound surface tissue looks healthier. He says he has another couple of days left of his antibiotics. 08/15/2022: The wounds look roughly the same today. He has built up a fair amount of callus around them. There is some slough on the surfaces. No concern for infection. He ran out of dressing supplies and therefore did not change his dressings all week. Patient History Family History Unknown History, Cancer, No family history of Diabetes, Heart Disease, Hereditary Spherocytosis, Hypertension, Kidney Disease, Lung Disease, Seizures, Stroke, Thyroid Problems, Tuberculosis. Social History Never smoker, Marital Status - Married, Alcohol Use - Never, Drug Use - No History, Caffeine Use - Rarely. Medical History Hematologic/Lymphatic Patient has history of Lymphedema Cardiovascular Patient has history of Hypertension Endocrine Patient has history of Type II Diabetes Neurologic Patient has history of Neuropathy Medical A Surgical History Notes nd Genitourinary AKI Objective Constitutional Slightly hypertensive. no acute distress. Vitals Time Taken: 11:30 AM, Height: 72 in, Weight: 214 lbs, BMI: 29, Temperature: 99.5 F, Pulse: 74 bpm, Respiratory Rate: 18 breaths/min, Blood Pressure: 148/83 mmHg, Capillary Blood Glucose: 183 mg/dl. General Notes: glucose per pt report this am Respiratory Normal work of breathing on room air. General Notes: 08/15/2022: The wounds look roughly the same today. He has built up a fair amount of callus around them. There is some slough on the surfaces. No concern for infection. Integumentary (Hair, Skin) Wound #3 status is Open. Original cause of wound was Gradually Appeared. The date acquired was: 05/29/2022. The wound has been in treatment 11 weeks. The wound is located on the Right,Plantar Foot. The wound measures 1.3cm length x 1.7cm width x 0.5cm depth; 1.736cm^2 area and 0.868cm^3  volume. There is Fat Layer (Subcutaneous Tissue) exposed. There is no tunneling or undermining noted. There is a medium amount of sanguinous drainage noted. The wound margin is thickened. There is large (67-100%) red granulation within the wound bed. There is a small (1-33%) amount of necrotic tissue within the wound bed including Adherent Slough. The periwound skin appearance had no abnormalities noted for moisture. The periwound skin appearance had no abnormalities noted for color. The periwound skin appearance exhibited: Callus. Periwound temperature was noted as No Abnormality. Wound #4 status is Open. Original cause of wound was Hematoma. The date acquired was: 05/29/2022. The wound has been in  treatment 11 weeks. The wound is located on the Left Amputation Site - Transmetatarsal. The wound measures 0.6cm length x 0.6cm width x 0.5cm depth; 0.283cm^2 area and 0.141cm^3 volume. There is Fat Layer (Subcutaneous Tissue) exposed. There is no tunneling or undermining noted. There is a medium amount of serosanguineous drainage noted. The wound margin is distinct with the outline attached to the wound base. There is large (67-100%) red granulation within the wound bed. There is a small (1-33%) amount of necrotic tissue within the wound bed including Adherent Slough. The periwound skin appearance had no abnormalities noted for moisture. The periwound skin appearance had no abnormalities noted for color. The periwound skin appearance exhibited: Callus. Periwound temperature was noted as No Abnormality. 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 There was a Selective/Open Wound Skin/Epidermis Debridement with a total area of 2.6 sq cm performed by Duanne Guess, MD. With the following instrument(s): Curette to remove Non-Viable tissue/material. Material removed includes Callus, Slough, and Skin: Epidermis after achieving pain control using Lidocaine 4% T opical Solution. No specimens were taken. A time out was conducted at 11:50, prior to the start of the procedure. A Minimum amount of bleeding was controlled with Pressure. The procedure was tolerated well with a pain level of 0 throughout and a pain level of 0 following the procedure. Post Debridement Measurements: 1.3cm length x 1.7cm width x 0.5cm depth; 0.868cm^3 volume. Character of Wound/Ulcer Post Debridement is improved. Severity of Tissue Post Debridement is: Fat layer exposed. Post procedure Diagnosis Wound #3: Same as Pre-Procedure General Notes: scribed for Dr. Lady Gary by Zenaida Deed, RN. 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 There was a Selective/Open Wound Skin/Epidermis Debridement with a total area of 0.57 sq cm performed by Duanne Guess, MD. With the following instrument(s): Curette to remove Non-Viable tissue/material. Material removed includes Callus, Slough, and Skin: Epidermis after achieving pain control using Lidocaine 4% Topical Solution. No specimens were taken. A time out was conducted at 11:50, prior to the start of the procedure. A Minimum amount of bleeding was controlled with Pressure. The procedure was tolerated well with a pain level of 0 throughout and a pain level of 0 following the procedure. Post  Debridement Measurements: 0.6cm length x 0.6cm width x 0.5cm depth; 0.141cm^3 volume. Character of Wound/Ulcer Post Debridement is improved. Severity of Tissue Post Debridement is: Fat layer exposed. Post procedure Diagnosis Wound #4: Same as Pre-Procedure General Notes: scribed for Dr. Lady Gary by Zenaida Deed, RN. Plan Follow-up Appointments: Return Appointment in 1 week. - Dr. Lady Gary - room 1 Monday 7/29 @ 09:45 am Anesthetic: (In clinic) Topical Lidocaine 4% applied to wound bed Bathing/ Shower/ Hygiene: May shower and wash wound with soap and water. Non Wound Condition: Apply the following to affected area as directed: - urea cream 40% to callouses daily (do not put on open areas) WOUND #3: - Foot Wound Laterality: Plantar, Right Cleanser: Soap  and Water Every Other Day/10 Days Discharge Instructions: May shower and wash wound with dial antibacterial soap and water prior to dressing change. Prim Dressing: Maxorb Extra Ag+ Alginate Dressing, 2x2 (in/in) Every Other Day/10 Days ary Discharge Instructions: Apply to wound bed as instructed Secondary Dressing: ALLEVYN Heel 4 1/2in x 5 1/2in / 10.5cm x 13.5cm Every Other Day/10 Days Discharge Instructions: Apply over primary dressing as directed. Secondary Dressing: Woven Gauze Sponge, Non-Sterile 4x4 in Every Other Day/10 Days Discharge Instructions: Apply over primary dressing as directed. Secured With: 491M Medipore H Soft Cloth Surgical T ape, 4 x 10 (in/yd) Every Other Day/10 Days Discharge Instructions: Secure with tape as directed. WOUND #4: - Amputation Site - Transmetatarsal Wound Laterality: Left Cleanser: Soap and Water 1 x Per Day/10 Days Discharge Instructions: May shower and wash wound with dial antibacterial soap and water prior to dressing change. Prim Dressing: Maxorb Extra Ag+ Alginate Dressing, 2x2 (in/in) 1 x Per Day/10 Days ary Discharge Instructions: Apply to wound bed as instructed Secondary Dressing: ALLEVYN Heel 4  1/2in x 5 1/2in / 10.5cm x 13.5cm 1 x Per Day/10 Days Discharge Instructions: Apply over primary dressing as directed. Secondary Dressing: Woven Gauze Sponge, Non-Sterile 4x4 in 1 x Per Day/10 Days Discharge Instructions: Apply over primary dressing as directed. Secured With: 491M Medipore H Soft Cloth Surgical T ape, 4 x 10 (in/yd) 1 x Per Day/10 Days Discharge Instructions: Secure with tape as directed. 08/15/2022: The wounds look roughly the same today. He has built up a fair amount of callus around them. There is some slough on the surfaces. No concern for infection. I used a curette to debride slough, skin, and callus from his wounds. We will continue to use silver alginate on the wound sites. As he has Medicaid, his dressing supplies or not covered; we will look to see what donated items we may have to help him out. Follow-up in 1 week. Electronic Signature(s) Signed: 08/15/2022 12:20:21 PM By: Duanne Guess MD FACS Entered By: Duanne Guess on 08/15/2022 12:20:21 [name] ([id number]) [id number] Page 9 of 10 -------------------------------------------------------------------------------- HxROS Details Patient Name: Date of Service: [name] 08/15/2022 11:15 A M Medical Record Number: [id number] Patient Account Number: [id number] Date of Birth/Sex: Treating RN: 1965-12-29 (57 y.o. [name] Primary Care Provider: Marrianne Mood Other Clinician: Referring Provider: Treating Provider/Extender: Alena Bills in Treatment: 27 Hematologic/Lymphatic Medical History: Positive for: Lymphedema Cardiovascular Medical History: Positive for: Hypertension Endocrine Medical History: Positive for: Type II Diabetes Treated with: Insulin, Oral agents Genitourinary Medical History: Past Medical History Notes: AKI Neurologic Medical History: Positive for: Neuropathy Immunizations Pneumococcal Vaccine: Received  Pneumococcal Vaccination: Yes Received Pneumococcal Vaccination On or After 60th Birthday: Yes Implantable Devices No devices added Family and Social History Unknown History: Yes; Cancer: Yes; Diabetes: No; Heart Disease: No; Hereditary Spherocytosis: No; Hypertension: No; Kidney Disease: No; Lung Disease: No; Seizures: No; Stroke: No; Thyroid Problems: No; Tuberculosis: No; Never smoker; Marital Status - Married; Alcohol Use: Never; Drug Use: No History; Caffeine Use: Rarely; Financial Concerns: No; Food, Clothing or Shelter Needs: No; Support System Lacking: No; Transportation Concerns: No Electronic Signature(s) Signed: 08/15/2022 12:38:39 PM By: Duanne Guess MD FACS Signed: 08/15/2022 5:33:12 PM By: Zenaida Deed RN, BSN Entered By: Duanne Guess on 08/15/2022 12:13:06 -------------------------------------------------------------------------------- SuperBill Details Patient Name: Date of Service: [name] 08/15/2022 Medical Record Number: [id number] Patient Account Number: [id number] Date of Birth/Sex: Treating RN: [birth year] (57 y.o. 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 E11.621 Type 2 diabetes mellitus with foot ulcer Z89.439 Acquired absence of unspecified foot Facility Procedures : CPT4 Code: [id number] Description: 97597 - DEBRIDE WOUND 1ST 20 SQ CM OR < ICD-10 Diagnosis Description 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 wit L97.522 Non-pressure chronic  ulcer of other part of left foot with fat layer  exposed Modifier: hout evidence of n Quantity: 1 ecrosis Physician Procedures : CPT4 Code Description Modifier [id number] - WC PHYS LEVEL 3 - EST PT 25 ICD-10 Diagnosis Description 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 without evidence of ne L97.522 Non-pressure chronic ulcer of  other part of left foot with fat layer exposed E11.621 Type 2 diabetes mellitus with foot ulcer Z89.439 Acquired absence of unspecified foot Quantity: 1 crosis : [id number] 97597 - WC PHYS DEBR WO ANESTH 20 SQ CM ICD-10 Diagnosis Description 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

## 2022-08-24 ENCOUNTER — Encounter: Payer: Medicaid Other | Admitting: Student

## 2022-08-27 ENCOUNTER — Encounter (HOSPITAL_BASED_OUTPATIENT_CLINIC_OR_DEPARTMENT_OTHER): Payer: Medicaid Other | Admitting: General Surgery

## 2022-08-27 DIAGNOSIS — E11621 Type 2 diabetes mellitus with foot ulcer: Secondary | ICD-10-CM | POA: Diagnosis not present

## 2022-08-28 NOTE — Progress Notes (Addendum)
[place], [name] ([id number]) [id number] Page 1 of 8 Visit Report for 08/27/2022 Arrival Information Details Patient Name: Date of Service: Samuel Most MA R 08/27/2022 9:45 A M Medical Record Number: [id number] Patient Account Number: [id number] Date of Birth/Sex: Treating RN: 01/08/1966 (57 y.o. [name] Primary Care Nickolette Espinola: Marrianne Mood Other Clinician: Referring Vernice Bowker: Treating Finnis Colee/Extender: Alena Bills in Treatment: 29 Visit Information History Since Last Visit All ordered tests and consults were completed: No Patient Arrived: Ambulatory Added or deleted any medications: No Arrival Time: 08:00 Any new allergies or adverse reactions: No Accompanied By: self Had a fall or experienced change in No Transfer Assistance: None activities of daily living that may affect Patient Identification Verified: Yes risk of falls: Secondary Verification Process Completed: Yes Signs or symptoms of abuse/neglect since last visito No Patient Requires Transmission-Based Precautions: No Hospitalized since last visit: No Patient Has Alerts: No Implantable device outside of the clinic excluding No cellular tissue based products placed in the center since last visit: Has Dressing in Place as Prescribed: Yes Pain Present Now: No Electronic Signature(s) Signed: 08/27/2022 5:48:36 PM By: Zenaida Deed RN, BSN Entered By: Zenaida Deed on 08/27/2022 07:13:10 -------------------------------------------------------------------------------- Complex / Palliative Patient Assessment Details Patient Name: Date of Service: Samuel Most MA R 08/27/2022 9:45 A M Medical Record Number: [id number] Patient Account Number: [id number] Date of Birth/Sex: Treating RN: 1965/10/16 (57 y.o. [name] Primary Care Jeneane Pieczynski: Marrianne Mood Other Clinician: Referring Mansa Willers: Treating Willie Plain/Extender: Alena Bills in Treatment: 29 Complex Wound Management Criteria Patient has remarkable or complex co-morbidities requiring medications or treatments that extend wound healing times. Examples: Diabetes mellitus with chronic renal failure or end stage renal disease requiring dialysis Advanced or poorly controlled rheumatoid arthritis Diabetes mellitus and end stage chronic obstructive pulmonary disease Active cancer with current chemo- or radiation therapy DM, bilateral transmet amputations, htn Palliative Wound Management Criteria Care Approach Wound Care Plan: Complex Wound Management Electronic Signature(s) Signed: 09/27/2022 4:39:03 PM By: Zenaida Deed RN, BSN Signed: 12/03/2022 4:14:53 PM By: Duanne Guess MD FACS Entered By: Zenaida Deed on 09/27/2022 13:39:03 [name] ([id number]) [id number] Page 2 of 8 -------------------------------------------------------------------------------- Encounter Discharge Information Details Patient Name: Date of Service: [name] [place] R 08/27/2022 9:45 A M Medical Record Number: [id number] Patient Account Number: [id number] Date of Birth/Sex: Treating RN: 1965/09/18 (57 y.o. [name] Primary Care Tiffanny Lamarche: Marrianne Mood Other Clinician: Referring Jiovanny Burdell: Treating Markeia Harkless/Extender: Alena Bills in Treatment: 29 Encounter Discharge Information Items Post Procedure Vitals Discharge Condition: Stable Temperature (F): 98.7 Ambulatory Status: Ambulatory Pulse (bpm): 85 Discharge Destination: Home Respiratory Rate (breaths/min): 18 Transportation: Private Auto Blood Pressure (mmHg): 155/85 Accompanied By: self Schedule Follow-up Appointment: Yes Clinical Summary of Care: Patient Declined Electronic Signature(s) Signed: 08/27/2022 5:48:36 PM By: Zenaida Deed RN, BSN Entered By: Zenaida Deed on 08/27/2022  07:48:38 -------------------------------------------------------------------------------- Lower Extremity Assessment Details Patient Name: Date of Service: Samuel Most MA R 08/27/2022 9:45 A M Medical Record Number: [id number] Patient Account Number: [id number] Date of Birth/Sex: Treating RN: 02-01-1965 (58 y.o. [name] Primary Care Rennae Ferraiolo: Marrianne Mood Other Clinician: Referring Pearce Littlefield: Treating Dante Cooter/Extender: Alena Bills in Treatment: 29 Edema Assessment Assessed: [name]: No] Franne Forts: No] Edema: [Left: No] [Right: No] Calf Left: Right: Point of Measurement: From Medial Instep 38 cm 38.5 cm Ankle Left: Right: Point of Measurement: From Medial Instep 27.8 cm 27.5 cm Vascular Assessment Pulses: Dorsalis Pedis Palpable: [Left:Yes] [  Right:Yes] Extremity colors, hair growth, and conditions: Extremity Color: [Left:Hyperpigmented] [Right:Hyperpigmented] Hair Growth on Extremity: [Left:No] [Right:No] Temperature of Extremity: [Left:Warm < 3 seconds] [Right:Warm < 3 seconds] Electronic Signature(s) Signed: 08/27/2022 5:48:36 PM By: Zenaida Deed RN, BSN [place], [name] ([id number]) [id number] Page 3 of 8 Signed: 08/27/2022 5:48:36 PM By: Zenaida Deed RN, BSN Entered By: Zenaida Deed on 08/27/2022 07:16:43 -------------------------------------------------------------------------------- Multi Wound Chart Details Patient Name: Date of Service: Samuel Most MA R 08/27/2022 9:45 A M Medical Record Number: [id number] Patient Account Number: [id number] Date of Birth/Sex: Treating RN: 08-Mar-1965 (57 y.o. [name] Primary Care Shanikia Kernodle: Marrianne Mood Other Clinician: Referring Alyssamae Klinck: Treating Corri Delapaz/Extender: Alena Bills in Treatment: 29 Vital Signs Height(in): 72 Capillary Blood Glucose(mg/dl): 782 Weight(lbs): 956 Pulse(bpm): 85 Body Mass Index(BMI): 29 Blood  Pressure(mmHg): 155/87 Temperature(F): 98.7 Respiratory Rate(breaths/min): 18 [3:Photos:] [N/A:N/A] Right, Plantar Foot Left Amputation Site - Transmetatarsal N/A Wound Location: Gradually Appeared Hematoma N/A Wounding Event: Diabetic Wound/Ulcer of the Lower Diabetic Wound/Ulcer of the Lower N/A Primary Etiology: Extremity Extremity Lymphedema, Hypertension, Type II Lymphedema, Hypertension, Type II N/A Comorbid History: Diabetes, Neuropathy Diabetes, Neuropathy 05/29/2022 05/29/2022 N/A Date Acquired: 12 12 N/A Weeks of Treatment: Open Open N/A Wound Status: No No N/A Wound Recurrence: 1.2x1x0.3 0.5x0.6x0.3 N/A Measurements L x W x D (cm) 0.942 0.236 N/A A (cm) : rea 0.283 0.071 N/A Volume (cm) : -1092.40% 97.00% N/A % Reduction in A rea: -1668.70% 99.10% N/A % Reduction in Volume: Grade 2 Grade 2 N/A Classification: Medium Medium N/A Exudate A mount: Serosanguineous Serosanguineous N/A Exudate Type: red, brown red, brown N/A Exudate Color: Thickened Distinct, outline attached N/A Wound Margin: Large (67-100%) Large (67-100%) N/A Granulation A mount: Red, Friable Red N/A Granulation Quality: None Present (0%) Small (1-33%) N/A Necrotic A mount: Fat Layer (Subcutaneous Tissue): Yes Fat Layer (Subcutaneous Tissue): Yes N/A Exposed Structures: Fascia: No Fascia: No Tendon: No Tendon: No Muscle: No Muscle: No Joint: No Joint: No Bone: No Bone: No Small (1-33%) None N/A Epithelialization: Debridement - Selective/Open Wound Debridement - Selective/Open Wound N/A Debridement: Pre-procedure Verification/Time Out 10:25 10:25 N/A Taken: Lidocaine 4% T opical Solution Lidocaine 4% T opical Solution N/A Pain Control: Callus, Slough Callus, Slough N/A Tissue Debrided: Skin/Epidermis Skin/Epidermis N/A Level: 1.41 0.24 N/A Debridement A (sq cm): rea Curette Curette N/A Instrument: Minimum Minimum N/A Bleeding: Pressure Pressure N/A Hemostasis A  chieved: N/A 0 N/A Procedural Pain: N/A 0 N/A Post Procedural PainIsaac Hernandez ([id number]) [id number] Page 4 of 8 Procedure was tolerated well Procedure was tolerated well N/A Debridement Treatment Response: 1.2x1x0.3 0.5x0.6x0.3 N/A Post Debridement Measurements L x W x D (cm) 0.283 0.071 N/A Post Debridement Volume: (cm) Callus: Yes Callus: Yes N/A Periwound Skin Texture: Dry/Scaly: Yes Dry/Scaly: Yes N/A Periwound Skin Moisture: No Abnormalities Noted No Abnormalities Noted N/A Periwound Skin Color: No Abnormality No Abnormality N/A Temperature: Yes N/A N/A Tenderness on Palpation: Debridement Debridement N/A Procedures Performed: Treatment Notes Electronic Signature(s) Signed: 08/27/2022 10:36:35 AM By: Duanne Guess MD FACS Signed: 08/27/2022 5:48:36 PM By: Zenaida Deed RN, BSN Entered By: Duanne Guess on 08/27/2022 07:36:34 -------------------------------------------------------------------------------- Multi-Disciplinary Care Plan Details Patient Name: Date of Service: Samuel Most MA R 08/27/2022 9:45 A M Medical Record Number: [id number] Patient Account Number: [id number] Date of Birth/Sex: Treating RN: 04/11/1965 (57 y.o. [name] Primary Care Ivanell Deshotel: Marrianne Mood Other Clinician: Referring Jenee Spaugh: Treating Alvin Rubano/Extender: Alena Bills in Treatment: 29 Multidisciplinary Care Plan reviewed with physician Active Inactive Electronic Signature(s)  Signed: 10/19/2022 12:02:02 PM By: Shawn Stall RN, BSN Signed: 10/29/2022 4:01:49 PM By: Zenaida Deed RN, BSN Previous Signature: 08/27/2022 5:48:36 PM Version By: Zenaida Deed RN, BSN Entered By: Shawn Stall on 10/19/2022 09:02:01 -------------------------------------------------------------------------------- Pain Assessment Details Patient Name: Date of Service: Samuel Most MA R 08/27/2022 9:45 A M Medical Record Number:  [id number] Patient Account Number: [id number] Date of Birth/Sex: Treating RN: 1965/04/20 (57 y.o. [name] Primary Care Greely Atiyeh: Marrianne Mood Other Clinician: Referring Derron Pipkins: Treating Nakai Yard/Extender: Alena Bills in Treatment: 29 Active Problems Location of Pain Severity and Description of Pain Patient Has Paino No Site Locations [place], [place] ([id number]) [id number] Page 5 of 8 Pain Management and Medication Current Pain Management: Electronic Signature(s) Signed: 08/27/2022 1:18:11 PM By: Dayton Scrape Signed: 08/27/2022 5:48:36 PM By: Zenaida Deed RN, BSN Entered By: Dayton Scrape on 08/27/2022 07:00:07 -------------------------------------------------------------------------------- Patient/Caregiver Education Details Patient Name: Date of Service: Samuel Most MA R 7/29/2024andnbsp9:45 A M Medical Record Number: [id number] Patient Account Number: [id number] Date of Birth/Gender: Treating RN: [birth year] (57 y.o. [name] Primary Care Physician: Marrianne Mood Other Clinician: Referring Physician: Treating Physician/Extender: Alena Bills in Treatment: 29 Education Assessment Education Provided To: Patient Education Topics Provided Offloading: Methods: Explain/Verbal Responses: Reinforcements needed, State content correctly Wound/Skin Impairment: Methods: Explain/Verbal Responses: Reinforcements needed, State content correctly Electronic Signature(s) Signed: 08/27/2022 5:48:36 PM By: Zenaida Deed RN, BSN Entered By: Zenaida Deed on 08/27/2022 07:03:07 Wound Assessment Details -------------------------------------------------------------------------------- [name] ([id number]) [id number] Page 6 of 8 Patient Name: Date of Service: Samuel Most MA R 08/27/2022 9:45 A M Medical Record Number: [id number] Patient Account Number:  [id number] Date of Birth/Sex: Treating RN: 03/09/1965 (57 y.o. [name] Primary Care Kit Brubacher: Marrianne Mood Other Clinician: Referring Jervis Trapani: Treating Michelene Keniston/Extender: Alena Bills in Treatment: 29 Wound Status Wound Number: 3 Primary Etiology: Diabetic Wound/Ulcer of the Lower Extremity Wound Location: Right, Plantar Foot Wound Status: Open Wounding Event: Gradually Appeared Comorbid History: Lymphedema, Hypertension, Type II Diabetes, Neuropathy Date Acquired: 05/29/2022 Weeks Of Treatment: 12 Clustered Wound: No Photos Wound Measurements Length: (cm) 1.2 % Reduction in Area: -1092.4% Width: (cm) 1 % Reduction in Volume: -1668.7% Depth: (cm) 0.3 Epithelialization: Small (1-33%) Area: (cm) 0.942 Tunneling: No Volume: (cm) 0.283 Undermining: No Wound Description Classification: Grade 2 Foul Odor After Cleansing: No Wound Margin: Thickened Slough/Fibrino No Exudate Amount: Medium Exudate Type: Serosanguineous Exudate Color: red, brown Wound Bed Granulation Amount: Large (67-100%) Exposed Structure Granulation Quality: Red, Friable Fascia Exposed: No Necrotic Amount: None Present (0%) Fat Layer (Subcutaneous Tissue) Exposed: Yes Tendon Exposed: No Muscle Exposed: No Joint Exposed: No Bone Exposed: No Periwound Skin Texture Texture Color No Abnormalities Noted: No No Abnormalities Noted: Yes Callus: Yes Temperature / Pain Temperature: No Abnormality Moisture No Abnormalities Noted: Yes Tenderness on Palpation: Yes Electronic Signature(s) Signed: 08/27/2022 5:48:36 PM By: Zenaida Deed RN, BSN Entered By: Zenaida Deed on 08/27/2022 07:25:49 Wound Assessment Details -------------------------------------------------------------------------------- [name] ([id number]) [id number] Page 7 of 8 Patient Name: Date of Service: Samuel Most MA R 08/27/2022 9:45 A M Medical Record Number:  [id number] Patient Account Number: [id number] Date of Birth/Sex: Treating RN: August 12, 1965 (57 y.o. [name] Primary Care Ayat Drenning: Marrianne Mood Other Clinician: Referring Senya Hinzman: Treating Lestine Rahe/Extender: Alena Bills in Treatment: 29 Wound Status Wound Number: 4 Primary Etiology: Diabetic Wound/Ulcer of the Lower Extremity Wound Location: Left Amputation Site - Transmetatarsal Wound Status: Open Wounding Event: Hematoma Comorbid  Lymphedema, Hypertension, Type II Diabetes, Neuropathy History: Date Acquired: 05/29/2022 Weeks Of Treatment: 12 Clustered Wound: No Photos Wound Measurements Length: (cm) 0.5 % Reduction in Area: 97% Width: (cm) 0.6 % Reduction in Volume: 99.1% Depth: (cm) 0.3 Epithelialization: None Area: (cm) 0.236 Tunneling: No Volume: (cm) 0.071 Undermining: No Wound Description Classification: Grade 2 Foul Odor After Cleansing: No Wound Margin: Distinct, outline attached Slough/Fibrino Yes Exudate Amount: Medium Exudate Type: Serosanguineous Exudate Color: red, brown Wound Bed Granulation Amount: Large (67-100%) Exposed Structure Granulation Quality: Red Fascia Exposed: No Necrotic Amount: Small (1-33%) Fat Layer (Subcutaneous Tissue) Exposed: Yes Necrotic Quality: Adherent Slough Tendon Exposed: No Muscle Exposed: No Joint Exposed: No Bone Exposed: No Periwound Skin Texture Texture Color No Abnormalities Noted: No No Abnormalities Noted: Yes Callus: Yes Temperature / Pain Temperature: No Abnormality Moisture No Abnormalities Noted: Yes Electronic Signature(s) Signed: 08/27/2022 5:48:36 PM By: Zenaida Deed RN, BSN Entered By: Zenaida Deed on 08/27/2022 07:26:16 Vitals Details -------------------------------------------------------------------------------- [name] ([id number]) [id number] Page 8 of 8 Patient Name: Date of Service: [name] [place] R 08/27/2022 9:45 A  M Medical Record Number: [id number] Patient Account Number: [id number] Date of Birth/Sex: Treating RN: 09/13/1965 (57 y.o. [name] Primary Care Demorris Choyce: Marrianne Mood Other Clinician: Referring Annalis Kaczmarczyk: Treating Loran Fleet/Extender: Alena Bills in Treatment: 29 Vital Signs Time Taken: 09:59 Temperature (F): 98.7 Height (in): 72 Pulse (bpm): 85 Weight (lbs): 214 Respiratory Rate (breaths/min): 18 Body Mass Index (BMI): 29 Blood Pressure (mmHg): 155/87 Capillary Blood Glucose (mg/dl): 782 Reference Range: 80 - 120 mg / dl Electronic Signature(s) Signed: 08/27/2022 1:18:11 PM By: Dayton Scrape Entered By: Dayton Scrape on 08/27/2022 06:59:59

## 2022-08-28 NOTE — Progress Notes (Signed)
[place], [name] ([id number]) [id number] Page 1 of 10 Visit Report for 08/27/2022 Chief Complaint Document Details Patient Name: Date of Service: Samuel Most MA R 08/27/2022 9:45 A M Medical Record Number: [id number] Patient Account Number: [id number] Date of Birth/Sex: Treating RN: 09/11/1965 (57 y.o. 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 [name] Primary Care Provider: Marrianne Mood Other Clinician: Referring Provider: Treating Provider/Extender: Alena Bills in Treatment: 29 Debridement Performed for Assessment: Wound #3 Right,Plantar Foot Performed By: Physician Samuel Guess, MD Debridement Type: Debridement Severity of Tissue Pre Debridement: Fat layer exposed Level of Consciousness (Pre-procedure): Awake and Alert Pre-procedure Verification/Time Out Yes - 10:25 Taken: Start Time: 10:28 Pain Control: Lidocaine 4% Topical Solution Percent of Wound Bed Debrided: 150% T Area Debrided (cm): otal 1.41 Tissue and other material debrided: Non-Viable, Callus, Slough, Skin: Epidermis, Slough Level:  Skin/Epidermis Debridement Description: Selective/Open Wound Instrument: Curette Bleeding: Minimum Hemostasis Achieved: Pressure Response to Treatment: Procedure was tolerated well Level of Consciousness (Post- Awake and Alert procedure): Post Debridement Measurements of Total Wound Length: (cm) 1.2 Width: (cm) 1 Depth: (cm) 0.3 Volume: (cm) 0.283 Character of Wound/Ulcer Post Debridement: Improved Severity of Tissue Post Debridement: Fat layer exposed [place], [name] ([id number]) [id number] Page 2 of 10 Post Procedure Diagnosis Same as Pre-procedure Notes scribed for Dr. Lady Gary by Samuel Deed, RN Electronic Signature(s) Signed: 08/27/2022 11:34:10 AM By: Samuel Guess MD FACS Signed: 08/27/2022 5:48:36 PM By: Samuel Deed RN, BSN Entered By: [name] on 08/27/2022 10:32:53 -------------------------------------------------------------------------------- Debridement Details Patient Name: Date of Service: Samuel Most MA R 08/27/2022 9:45 A M Medical Record Number: [id number] Patient Account Number: [id number] Date of Birth/Sex: Treating RN: [birth year] (57 y.o. [name] Primary Care Provider: Marrianne Mood Other Clinician: Referring Provider: Treating Provider/Extender: Alena Bills in Treatment: 29 Debridement Performed for Assessment: Wound #4 Left Amputation Site - Transmetatarsal Performed By: Physician Samuel Guess, MD Debridement Type: Debridement Severity of Tissue Pre Debridement: Fat layer exposed Level of Consciousness (Pre-procedure): Awake and Alert Pre-procedure Verification/Time Out Yes - 10:25 Taken: Start Time: 10:28 Pain Control: Lidocaine 4% Topical Solution Percent of Wound Bed Debrided: 100% T Area Debrided (cm): otal 0.24 Tissue and other material debrided: Non-Viable, Callus, Slough, Skin: Epidermis, Slough Level: Skin/Epidermis Debridement Description: Selective/Open  Wound Instrument: Curette Bleeding: Minimum Hemostasis Achieved: Pressure Procedural Pain: 0 Post Procedural Pain: 0 Response to Treatment: Procedure was tolerated well Level of Consciousness (Post- Awake and Alert procedure): Post Debridement Measurements of Total Wound Length: (cm) 0.5 Width: (cm) 0.6 Depth: (cm) 0.3 Volume: (cm) 0.071 Character of Wound/Ulcer Post Debridement: Improved Severity of Tissue Post Debridement: Fat layer exposed Post Procedure Diagnosis Same as Pre-procedure Notes scribed for Dr. Lady Gary by Samuel Deed, RN Electronic Signature(s) Signed: 08/27/2022 11:34:10 AM By: Samuel Guess MD FACS Signed: 08/27/2022 5:48:36 PM By: Samuel Deed RN, BSN Entered By: [name] on 08/27/2022 10:34:07 [place],  [name] ([id number]) [id number] Page 3 of 10 -------------------------------------------------------------------------------- HPI Details Patient Name: Date of Service: Samuel Most MA R 08/27/2022 9:45 A M Medical Record Number: [id number] Patient Account Number: [id number] Date of Birth/Sex: Treating RN: 1965/05/31 (57 y.o. [name] Primary Care Provider: Marrianne Mood Other Clinician: Referring Provider: Treating Provider/Extender: Alena Bills in Treatment: 29 History of Present Illness HPI Description: ADMISSION 02/02/2022 This is a 57 year old [place] man who speaks only Arabic. The visit today was conducted with the assistance of the language line interpreter; the patient declines the use of an in person interpreter. He is a poorly controlled diabetic (last hemoglobin A1c 10.8, but it has been as high as 15 in the past) with CKD stage III and hypertension. 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 He required revision of the right TMA in October. This was done in the [facility]  system. It is not entirely clear how he was referred to our clinic however he has openings on the distal portion of the right foot as well as drainage coming from the plantar aspect of his left foot underneath some callus. The patient has just been applying dry gauze to both areas. 02/09/2022: The left-sided wound is quite a bit smaller today, but is covered with callus. The right sided wound has also contracted a little bit and is very clean, but there is also callus accumulation around the perimeter. Unfortunately, due to his Medicaid status, we have been unable to secure home health assistance and he has been changing the dressings on his own. He also has difficulty obtaining dressing supplies. 02/19/2022: Both wounds are smaller, particularly on the right. The left is covered with a layer of callus. No concern for infection. 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 There is light slough on the surface with some surrounding callus. The left plantar foot wound has callused over. Underneath the callus, the there is a small opening in the skin. 03/06/2022: The wounds are about the same size this week. There is callus accumulation around each, but the surfaces are cleaner. 03/19/2022: The wound on the right is quite a bit smaller this week. There is minimal callus accumulation and light slough on the surface. The wound on the left is almost completely covered in callus. 03/26/2022: The wound on the right continues to contract and is narrower again this week. The wound on the left has become covered in callus once again. 04/24/2022: In the month since he was last seen, apparently the patient developed some purulent drainage from his foot and was seen by [facility]iatry at [facility]. He was debrided and given antibiotics. Subsequently, his wounds actually look extremely good today. There is callus covering the plantar ulcer on the left. The right TMA site is quite a bit narrower and  clean with just some callus and slough accumulation. 05/01/2022: The wound on the plantar surface of his left foot is closed under a thick layer of callus. On the right, the wound is substantially smaller with just a little bit of slough but also heavy buildup of callus around the wound opening. 05/08/2022: The left plantar foot ulcer remains closed. On the right, the wound continues to narrow significantly. There is still some depth to the wound, but it is clean. 05/22/2022: His wounds are closed. 05/29/2022: The wound on his right foot has reopened. This appears to be secondary to the callus cracking and causing a split  in the underlying tissue. In addition, his daughter reports that he has been complaining of pain in the left foot. There is a fluctuant area at the end of the foot and it appears there is some darker discoloration under the callus that covers the site where the wound was. 06/06/2022: The wound on his right foot has healed down to a narrow slit underneath a truly impressive amount of callus. The left foot has also tried to cover the wounded area with callus, but this is more open. There is slough buildup and some undermining secondary to the callus accumulation. The culture that I took last week only returned with very low levels of a variety of bacteria species and no antibiotics were prescribed. His foot is much less red and warm today. 06/14/2022: Both wounds are quite a bit smaller today with the usual callus accumulation. 07/16/2022: He returns after a month-long absence. He apparently saw podiatry on June 3 and they performed a debridement, but between then and today, he has built up substantial callus. It appears that moisture has gotten under the callus and caused substantial tissue breakdown. There is a foul odor coming from his right foot. No frank pus encountered. 07/24/2022: Both wounds look substantially better this week. There is no malodor present. He has his usual accumulation  of thick callus. His culture grew out a vastly polymicrobial population with multiple drug resistances. He is currently taking 3 different antibiotics to address the sensitivities. 08/07/2022: Both wounds look much better this week. There is less callus accumulation and the wound surface tissue looks healthier. He says he has another couple of days left of his antibiotics. 08/15/2022: The wounds look roughly the same today. He has built up a fair amount of callus around them. There is some slough on the surfaces. No concern for infection. He ran out of dressing supplies and therefore did not change his dressings all week. 08/27/2022: The right sided wound measured smaller today. The left is about the same size. Both have callus accumulation around the perimeter and minimal slough on the surfaces. Electronic Signature(s) Signed: 08/27/2022 10:37:26 AM By: Samuel Guess MD FACS Entered By: [name] on 08/27/2022 10:37:26 [name] ([id number]) [id number] Page 4 of 10 -------------------------------------------------------------------------------- Physical Exam Details Patient Name: Date of Service: Samuel Most MA R 08/27/2022 9:45 A M Medical Record Number: [id number] Patient Account Number: [id number] Date of Birth/Sex: Treating RN: May 12, 1965 (57 y.o. [name] Primary Care Provider: Marrianne Mood Other Clinician: Referring Provider: Treating Provider/Extender: Alena Bills in Treatment: 29 Constitutional Hypertensive, asymptomatic. . . . no acute distress. Respiratory Normal work of breathing on room air. Notes 08/27/2022: The right sided wound measured smaller today. The left is about the same size. Both have callus accumulation around the perimeter and minimal slough on the surfaces. Electronic Signature(s) Signed: 08/27/2022 10:38:00 AM By: Samuel Guess MD FACS Entered By: [name] on 08/27/2022  10:37:59 -------------------------------------------------------------------------------- Physician Orders Details Patient Name: Date of Service: Samuel Most MA R 08/27/2022 9:45 A M Medical Record Number: [id number] Patient Account Number: [id number] Date of Birth/Sex: Treating RN: 1965/11/30 (57 y.o. 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 - Dr. Lady Gary - room 2 Return A Tuesday 8/6 @ 08:30 am Anesthetic (In clinic) Topical Lidocaine 4% applied to wound bed Bathing/ Shower/ Hygiene May shower and wash wound with soap and water. Off-Loading Other: - no excessive walking Non Wound Condition pply the following to affected area as directed: - urea cream 40% to callouses daily (do not put on open areas) A [place], [name] ([id number]) [id number] Page 5 of 10 Wound Treatment Wound #3 - Foot Wound Laterality: Plantar, Right Cleanser: Soap and Water Every Other Day/10 Days Discharge Instructions: May shower and wash wound with dial antibacterial soap and water prior to dressing change. Prim Dressing: Maxorb Extra Ag+ Alginate Dressing, 2x2 (in/in) Every Other Day/10 Days ary Discharge Instructions: Apply to wound bed as instructed Secondary Dressing: ALLEVYN Heel 4 1/2in x 5 1/2in / 10.5cm x 13.5cm Every Other Day/10 Days Discharge Instructions: Apply over primary dressing as directed. Secondary Dressing: Woven  Gauze Sponge, Non-Sterile 4x4 in Every Other Day/10 Days Discharge Instructions: Apply over primary dressing as directed. Secured With: 47M Medipore H Soft Cloth Surgical T ape, 4 x 10 (in/yd) Every Other Day/10 Days Discharge Instructions: Secure with tape as directed. Wound #4 - Amputation Site - Transmetatarsal Wound Laterality: Left Cleanser: Soap and Water 1 x Per Day/10 Days Discharge Instructions: May shower and wash wound with dial antibacterial soap and water prior to dressing change. Prim Dressing: Maxorb Extra Ag+ Alginate Dressing, 2x2 (in/in) 1 x Per Day/10 Days ary Discharge Instructions: Apply to wound bed as instructed Secondary Dressing: ALLEVYN Heel 4 1/2in x 5 1/2in / 10.5cm x 13.5cm 1 x Per Day/10 Days Discharge Instructions: Apply over primary dressing as directed. Secondary Dressing: Woven Gauze Sponge, Non-Sterile 4x4 in 1 x Per Day/10 Days Discharge Instructions: Apply over primary dressing as directed. Secured With: 47M Medipore H Soft Cloth Surgical T ape, 4 x 10 (in/yd) 1 x Per Day/10 Days Discharge Instructions: Secure with tape as directed. Electronic Signature(s) Signed: 08/27/2022 11:34:10 AM By: Samuel Guess MD FACS Entered By: [name] on 08/27/2022 10:38:11 -------------------------------------------------------------------------------- Problem List Details Patient Name: Date of Service: Samuel Most MA R 08/27/2022 9:45 A M Medical Record Number: [id number] Patient Account Number: [id number] Date of Birth/Sex: Treating RN: 18-Apr-1965 (57 y.o. 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 02/02/2022 No Yes without evidence of necrosis L97.522  Non-pressure chronic ulcer of other part of left foot with fat layer exposed 02/02/2022 No Yes E11.621 Type 2 diabetes mellitus with foot ulcer 02/02/2022 No Yes Z89.439 Acquired absence of unspecified foot 02/02/2022 No Yes [place], [name] ([id number]) [id number] Page 6 of 10 Inactive Problems Resolved Problems Electronic Signature(s) Signed: 08/27/2022 10:36:28 AM By: Samuel Guess MD FACS Previous Signature: 08/27/2022 10:26:58 AM Version By: Samuel Guess MD FACS Entered By: [name] on 08/27/2022 10:36:28 -------------------------------------------------------------------------------- Progress Note Details Patient Name: Date of Service: Samuel Most MA R 08/27/2022 9:45 A M Medical Record Number: [id number] Patient Account Number: [id number] Date of Birth/Sex: Treating RN: Dec 03, 1965 (57 y.o. 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 The visit today was conducted with the assistance of the language line interpreter; the patient declines the use of an in person interpreter. He is a poorly controlled diabetic (last hemoglobin A1c 10.8, but it has been as high as 15 in the past) with CKD stage III and hypertension. 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 He required revision of the right TMA in October. This was done in the [facility]. It is not  entirely clear how he was referred to our clinic however he has openings on the distal portion of the right foot as well as drainage coming from the plantar aspect of his left foot underneath some callus. The patient has just been applying dry gauze to both areas. 02/09/2022: The left-sided wound is quite a bit smaller today, but is covered with callus. The right sided wound has also contracted a little bit and is very clean, but there is also callus accumulation around the perimeter. Unfortunately, due to his Medicaid status, we have been unable to secure home health assistance and he has been changing the dressings on his own. He also has difficulty obtaining dressing supplies. 02/19/2022: Both wounds are smaller, particularly on the right. The left is covered with a layer of callus. No concern for infection. 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 There is light slough on the surface with some surrounding callus. The left plantar foot wound has callused over. Underneath the callus, the there is a small opening in the skin. 03/06/2022: The wounds are about the same size this week. There is callus accumulation around each, but the surfaces are cleaner. 03/19/2022: The wound on the right is quite a bit smaller this week. There is minimal callus accumulation and light slough on the surface. The wound on the left is almost completely covered in callus. 03/26/2022: The wound on the right continues to contract and is narrower again this week. The wound on the left has become covered in callus once again. 04/24/2022: In the month since he was last seen, apparently the patient developed some purulent drainage from his foot and was seen by podiatry at [facility]. He was debrided and given antibiotics. Subsequently, his wounds actually look extremely good today. There is callus covering the plantar ulcer on the left. The right TMA site is quite a bit narrower and clean with just some  callus and slough accumulation. 05/01/2022: The wound on the plantar surface of his left foot is closed under a thick layer of callus. On the right, the wound is substantially smaller with just a little bit of slough but also heavy buildup of callus around the wound opening. 05/08/2022: The left plantar foot ulcer remains closed. On the right, the wound continues to narrow significantly. There is still some depth to the wound, but it is clean. 05/22/2022: His wounds are closed. 05/29/2022: The wound on his right foot has reopened. This appears to be secondary to the callus cracking and causing a split in the underlying tissue. In addition, his daughter reports that he has been complaining of pain in the left foot. There is a fluctuant area at the end of the foot  and it appears there is some darker discoloration under the callus that covers the site where the wound was. 06/06/2022: The wound on his right foot has healed down to a narrow slit underneath a truly impressive amount of callus. The left foot has also tried to cover the wounded area with callus, but this is more open. There is slough buildup and some undermining secondary to the callus accumulation. The culture that I took last week only returned with very low levels of a variety of bacteria species and no antibiotics were prescribed. His foot is much less red and warm today. [name], [name] ([id number]) [id number] Page 7 of 10 06/14/2022: Both wounds are quite a bit smaller today with the usual callus accumulation. 07/16/2022: He returns after a month-long absence. He apparently saw podiatry on June 3 and they performed a debridement, but between then and today, he has built up substantial callus. It appears that moisture has gotten under the callus and caused substantial tissue breakdown. There is a foul odor coming from his right foot. No frank pus encountered. 07/24/2022: Both wounds look substantially better this week.  There is no malodor present. He has his usual accumulation of thick callus. His culture grew out a vastly polymicrobial population with multiple drug resistances. He is currently taking 3 different antibiotics to address the sensitivities. 08/07/2022: Both wounds look much better this week. There is less callus accumulation and the wound surface tissue looks healthier. He says he has another couple of days left of his antibiotics. 08/15/2022: The wounds look roughly the same today. He has built up a fair amount of callus around them. There is some slough on the surfaces. No concern for infection. He ran out of dressing supplies and therefore did not change his dressings all week. 08/27/2022: The right sided wound measured smaller today. The left is about the same size. Both have callus accumulation around the perimeter and minimal slough on the surfaces. Patient History Family History Unknown History, Cancer, No family history of Diabetes, Heart Disease, Hereditary Spherocytosis, Hypertension, Kidney Disease, Lung Disease, Seizures, Stroke, Thyroid Problems, Tuberculosis. Social History Never smoker, Marital Status - Married, Alcohol Use - Never, Drug Use - No History, Caffeine Use - Rarely. Medical History Hematologic/Lymphatic Patient has history of Lymphedema Cardiovascular Patient has history of Hypertension Endocrine Patient has history of Type II Diabetes Neurologic Patient has history of Neuropathy Medical A Surgical History Notes nd Genitourinary AKI Objective Constitutional Hypertensive, asymptomatic. no acute distress. Vitals Time Taken: 9:59 AM, Height: 72 in, Weight: 214 lbs, BMI: 29, Temperature: 98.7 F, Pulse: 85 bpm, Respiratory Rate: 18 breaths/min, Blood Pressure: 155/87 mmHg, Capillary Blood Glucose: 178 mg/dl. Respiratory Normal work of breathing on room air. General Notes: 08/27/2022: The right sided wound measured smaller today. The left is about the same size. Both  have callus accumulation around the perimeter and minimal slough on the surfaces. Integumentary (Hair, Skin) Wound #3 status is Open. Original cause of wound was Gradually Appeared. The date acquired was: 05/29/2022. The wound has been in treatment 12 weeks. The wound is located on the Right,Plantar Foot. The wound measures 1.2cm length x 1cm width x 0.3cm depth; 0.942cm^2 area and 0.283cm^3 volume. There is Fat Layer (Subcutaneous Tissue) exposed. There is no tunneling or undermining noted. There is a medium amount of serosanguineous drainage noted. The wound margin is thickened. There is large (67-100%) red, friable granulation within the wound bed. There is no necrotic tissue within the wound bed. The periwound skin appearance had no  abnormalities noted for moisture. The periwound skin appearance had no abnormalities noted for color. The periwound skin appearance exhibited: Callus. Periwound temperature was noted as No Abnormality. The periwound has tenderness on palpation. Wound #4 status is Open. Original cause of wound was Hematoma. The date acquired was: 05/29/2022. The wound has been in treatment 12 weeks. The wound is located on the Left Amputation Site - Transmetatarsal. The wound measures 0.5cm length x 0.6cm width x 0.3cm depth; 0.236cm^2 area and 0.071cm^3 volume. There is Fat Layer (Subcutaneous Tissue) exposed. There is no tunneling or undermining noted. There is a medium amount of serosanguineous drainage noted. The wound margin is distinct with the outline attached to the wound base. There is large (67-100%) red granulation within the wound bed. There is a small (1-33%) amount of necrotic tissue within the wound bed including Adherent Slough. The periwound skin appearance had no abnormalities noted for moisture. The periwound skin appearance had no abnormalities noted for color. The periwound skin appearance exhibited: Callus. Periwound temperature was noted as No  Abnormality. 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 There was a Selective/Open Wound Skin/Epidermis Debridement with a total area of 1.41 sq cm performed by Samuel Guess, MD. With the following instrument(s): Curette to remove Non-Viable tissue/material. Material removed includes Callus, Slough, and Skin: Epidermis after achieving pain control using Lidocaine 4% T opical Solution. No specimens were taken. A time out was conducted at 10:25, prior to the start of the procedure. A Minimum amount of bleeding was controlled with Pressure. The procedure was tolerated well. Post Debridement Measurements: 1.2cm length x 1cm width x 0.3cm depth; 0.283cm^3 volume. Character of Wound/Ulcer Post Debridement is improved. Severity of Tissue Post Debridement is: Fat layer exposed. Post procedure Diagnosis Wound #3: Same as Pre-Procedure General Notes: scribed for Dr. Lady Gary by Samuel Deed, RN. 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 There was a Selective/Open Wound Skin/Epidermis Debridement with a total area of 0.24 sq cm performed by Samuel Guess, MD. With the following instrument(s): Curette to remove Non-Viable tissue/material. Material removed includes Callus, Slough, and Skin: Epidermis after achieving  pain control using Lidocaine 4% Topical Solution. No specimens were taken. A time out was conducted at 10:25, prior to the start of the procedure. A Minimum amount of bleeding was controlled with Pressure. The procedure was tolerated well with a pain level of 0 throughout and a pain level of 0 following the procedure. Post Debridement Measurements: 0.5cm length x 0.6cm width x 0.3cm depth; 0.071cm^3 volume. Character of Wound/Ulcer Post Debridement is improved. Severity of Tissue Post Debridement is: Fat layer exposed. Post procedure Diagnosis Wound #4: Same as Pre-Procedure General Notes: scribed for Dr. Lady Gary by Samuel Deed, RN. Plan Follow-up Appointments: Return Appointment in 1 week. - Dr. Lady Gary - room 2 Tuesday 8/6 @ 08:30 am Anesthetic: (In clinic) Topical Lidocaine 4% applied to wound bed Bathing/ Shower/ Hygiene: May shower and wash wound  with soap and water. Off-Loading: Other: - no excessive walking Non Wound Condition: Apply the following to affected area as directed: - urea cream 40% to callouses daily (do not put on open areas) WOUND #3: - Foot Wound Laterality: Plantar, Right Cleanser: Soap and Water Every Other Day/10 Days Discharge Instructions: May shower and wash wound with dial antibacterial soap and water prior to dressing change. Prim Dressing: Maxorb Extra Ag+ Alginate Dressing, 2x2 (in/in) Every Other Day/10 Days ary Discharge Instructions: Apply to wound bed as instructed Secondary Dressing: ALLEVYN Heel 4 1/2in x 5 1/2in / 10.5cm x 13.5cm Every Other Day/10 Days Discharge Instructions: Apply over primary dressing as directed. Secondary Dressing: Woven Gauze Sponge, Non-Sterile 4x4 in Every Other Day/10 Days Discharge Instructions: Apply over primary dressing as directed. Secured With: 31M Medipore H Soft Cloth Surgical T ape, 4 x 10 (in/yd) Every Other Day/10 Days Discharge Instructions: Secure with tape as directed. WOUND #4: - Amputation Site -  Transmetatarsal Wound Laterality: Left Cleanser: Soap and Water 1 x Per Day/10 Days Discharge Instructions: May shower and wash wound with dial antibacterial soap and water prior to dressing change. Prim Dressing: Maxorb Extra Ag+ Alginate Dressing, 2x2 (in/in) 1 x Per Day/10 Days ary Discharge Instructions: Apply to wound bed as instructed Secondary Dressing: ALLEVYN Heel 4 1/2in x 5 1/2in / 10.5cm x 13.5cm 1 x Per Day/10 Days Discharge Instructions: Apply over primary dressing as directed. Secondary Dressing: Woven Gauze Sponge, Non-Sterile 4x4 in 1 x Per Day/10 Days Discharge Instructions: Apply over primary dressing as directed. Secured With: 31M Medipore H Soft Cloth Surgical T ape, 4 x 10 (in/yd) 1 x Per Day/10 Days Discharge Instructions: Secure with tape as directed. 08/27/2022: The right sided wound measured smaller today. The left is about the same size. Both have callus accumulation around the perimeter and minimal slough on the surfaces. I used a curette to debride callus and slough from each of his wounds. We will continue silver alginate dressing changes. He will follow-up in 1 week. Electronic Signature(s) Signed: 08/27/2022 10:38:40 AM By: Samuel Guess MD FACS [name], [name] ([id number]) [id number] Page 9 of 10 Entered By: [name] on 08/27/2022 10:38:40 -------------------------------------------------------------------------------- HxROS Details Patient Name: Date of Service: Samuel Most MA R 08/27/2022 9:45 A M Medical Record Number: [id number] Patient Account Number: [id number] Date of Birth/Sex: Treating RN: Mar 25, 1965 (57 y.o. [name] Primary Care Provider: Marrianne Mood Other Clinician: Referring Provider: Treating Provider/Extender: Alena Bills in Treatment: 29 Hematologic/Lymphatic Medical History: Positive for: Lymphedema Cardiovascular Medical History: Positive for:  Hypertension Endocrine Medical History: Positive for: Type II Diabetes Treated with: Insulin, Oral agents Genitourinary Medical History: Past Medical History Notes: AKI Neurologic Medical History: Positive for: Neuropathy Immunizations Pneumococcal Vaccine: Received Pneumococcal Vaccination: Yes Received Pneumococcal Vaccination On or After 60th Birthday: Yes Implantable Devices No devices added Family and Social History Unknown History: Yes; Cancer: Yes; Diabetes: No; Heart Disease: No; Hereditary Spherocytosis: No; Hypertension: No; Kidney Disease: No; Lung Disease: No; Seizures: No; Stroke: No; Thyroid Problems: No; Tuberculosis: No; Never smoker; Marital Status - Married; Alcohol Use: Never; Drug Use: No History; Caffeine Use: Rarely; Financial Concerns: No; Food, Clothing or Shelter Needs: No; Support System Lacking: No; Transportation Concerns: No Electronic Signature(s) Signed: 08/27/2022 11:34:10 AM By: Samuel Guess MD FACS Signed: 08/27/2022 5:48:36 PM By: Samuel Deed RN, BSN Entered By: [name] on 08/27/2022 10:37:32 -------------------------------------------------------------------------------- SuperBill Details Patient Name: Date of Service: Samuel Most MA R 08/27/2022 [name], [name] (  [id number]) [id number] Page 10 of 10 Medical Record Number: [id number] Patient Account Number: [id number] Date of Birth/Sex: Treating RN: 1965/07/11 (57 y.o. 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 L97.522 Non-pressure chronic ulcer of other part of left foot with fat layer exposed E11.621 Type 2 diabetes mellitus with foot ulcer Z89.439  Acquired absence of unspecified foot Facility Procedures : CPT4 Code: [id number] Description: 97597 - DEBRIDE WOUND 1ST 20 SQ CM OR < ICD-10 Diagnosis Description 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 wit L97.522 Non-pressure chronic ulcer of other part of left foot with fat layer  exposed Modifier: hout evidence of n Quantity: 1 ecrosis Physician Procedures : CPT4 Code Description Modifier [id number] 99213 - WC PHYS LEVEL 3 - EST PT 25 ICD-10 Diagnosis Description 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 without evidence of ne L97.522 Non-pressure chronic ulcer of  other part of left foot with fat layer exposed E11.621 Type 2 diabetes mellitus with foot ulcer Z89.439 Acquired absence of unspecified foot Quantity: 1 crosis : [id number] 97597 - WC PHYS DEBR WO ANESTH 20 SQ CM ICD-10 Diagnosis Description 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

## 2022-09-04 ENCOUNTER — Ambulatory Visit (HOSPITAL_BASED_OUTPATIENT_CLINIC_OR_DEPARTMENT_OTHER): Payer: Medicaid Other | Admitting: General Surgery

## 2022-09-18 ENCOUNTER — Other Ambulatory Visit (HOSPITAL_COMMUNITY): Payer: Self-pay

## 2022-09-19 ENCOUNTER — Other Ambulatory Visit (HOSPITAL_COMMUNITY): Payer: Self-pay

## 2022-11-15 ENCOUNTER — Other Ambulatory Visit (HOSPITAL_COMMUNITY): Payer: Self-pay

## 2023-01-03 ENCOUNTER — Other Ambulatory Visit: Payer: Self-pay

## 2023-01-03 ENCOUNTER — Other Ambulatory Visit: Payer: Self-pay | Admitting: Student

## 2023-01-03 ENCOUNTER — Other Ambulatory Visit (HOSPITAL_COMMUNITY): Payer: Self-pay

## 2023-01-03 DIAGNOSIS — E1165 Type 2 diabetes mellitus with hyperglycemia: Secondary | ICD-10-CM

## 2023-01-04 ENCOUNTER — Other Ambulatory Visit (HOSPITAL_COMMUNITY): Payer: Self-pay

## 2023-01-04 MED ORDER — TRESIBA FLEXTOUCH 100 UNIT/ML ~~LOC~~ SOPN
70.0000 [IU] | PEN_INJECTOR | Freq: Every day | SUBCUTANEOUS | 2 refills | Status: AC
Start: 2023-01-04 — End: ?
  Filled 2023-01-04: qty 21, 30d supply, fill #0

## 2023-01-04 MED ORDER — TRULICITY 3 MG/0.5ML ~~LOC~~ SOAJ
3.0000 mg | SUBCUTANEOUS | 1 refills | Status: AC
Start: 2023-01-04 — End: ?
  Filled 2023-01-04: qty 2, 28d supply, fill #0

## 2023-01-07 ENCOUNTER — Telehealth: Payer: Self-pay

## 2023-01-07 NOTE — Telephone Encounter (Signed)
Prior Authorization for patient (Trulicity 3MG /0.5ML auto-injectors) came through on cover my meds was submitted with last office notes and labs awaiting approval or denial.  HQI:[id number]

## 2023-01-07 NOTE — Telephone Encounter (Signed)
Decision:Approved Isaac Laud (Key: [id number]) PA Case ID #: [id number] Rx #: [id number] Need Help? Call us at [phone] Outcome Approved today by [employer] 2017 Approved. TRULICITY 3MG /0.5ML Soln Auto-inj is approved from 01/07/2023 to 01/07/2024. Authorization Expiration Date: 01/07/2024 Drug Trulicity 3MG /0.5ML auto-injectors ePA cloud logo Form [employer] Electronic Prior Authorization Form Original Claim Info 75 Call [phone]. For a 3 day temporary supply, submit DUR PPS Level of Service Code 03.

## 2023-03-01 ENCOUNTER — Other Ambulatory Visit (HOSPITAL_COMMUNITY): Payer: Self-pay

## 2023-03-21 ENCOUNTER — Other Ambulatory Visit (HOSPITAL_COMMUNITY): Payer: Self-pay

## 2023-08-26 ENCOUNTER — Other Ambulatory Visit: Payer: Self-pay

## 2023-08-26 DIAGNOSIS — I10 Essential (primary) hypertension: Secondary | ICD-10-CM

## 2023-08-26 MED ORDER — HYDROCHLOROTHIAZIDE 12.5 MG PO CAPS
12.5000 mg | ORAL_CAPSULE | Freq: Every day | ORAL | 0 refills | Status: AC
Start: 2023-08-26 — End: ?

## 2023-08-26 NOTE — Telephone Encounter (Signed)
 Really need to see him in the office to continue prescribing medications.  Meds ordered this encounter  Medications   hydrochlorothiazide  (MICROZIDE ) 12.5 MG capsule    Sig: Take 1 capsule (12.5 mg total) by mouth daily.    Dispense:  30 capsule    Refill:  0   Ozell Kung MD 08/26/2023, 5:30 PM

## 2023-08-26 NOTE — Telephone Encounter (Signed)
 Patient last seen 07/27/22 I called the patient to schedule a follow up appointment. Unable to reach the patient, I lvm for him to give us  a call back.

## 2023-08-27 NOTE — Telephone Encounter (Signed)
 Attempted to call the patient. Unable to reach him, line is busy.

## 2023-09-24 ENCOUNTER — Other Ambulatory Visit: Payer: Self-pay | Admitting: Student

## 2023-09-24 DIAGNOSIS — I10 Essential (primary) hypertension: Secondary | ICD-10-CM

## 2023-09-26 NOTE — Telephone Encounter (Signed)
 Lvm for patient to give us  a call back to schedule a appointment and to clarify if he is still our patient.

## 2023-09-26 NOTE — Telephone Encounter (Signed)
 Looks like a family medicine doctor in [place]  is refilling this person's medications. He needs to be seen in the clinic for further refills.  Ozell Kung MD 09/26/2023, 10:21 AM

## 2023-11-07 ENCOUNTER — Other Ambulatory Visit: Payer: Self-pay | Admitting: Student

## 2023-11-07 DIAGNOSIS — I10 Essential (primary) hypertension: Secondary | ICD-10-CM

## 2023-11-08 NOTE — Telephone Encounter (Signed)
 Patient last seen 07/27/22 I called the patient to schedule a appointment. Unable to reach the patient, I lvm for him to give us  a call back.

## 2023-11-20 ENCOUNTER — Ambulatory Visit: Payer: Self-pay

## 2023-12-20 ENCOUNTER — Ambulatory Visit: Admitting: Student

## 2023-12-28 ENCOUNTER — Encounter: Payer: Self-pay | Admitting: Gastroenterology
